# Patient Record
Sex: Male | Born: 1955 | Race: White | Hispanic: No | Marital: Married | Smoking: Never smoker
Health system: Southern US, Community
[De-identification: ages and names within clinical notes are randomized; demographics above are authoritative.]

## PROBLEM LIST (undated history)

## (undated) DIAGNOSIS — I499 Cardiac arrhythmia, unspecified: Secondary | ICD-10-CM

## (undated) DIAGNOSIS — G5603 Carpal tunnel syndrome, bilateral upper limbs: Secondary | ICD-10-CM

## (undated) DIAGNOSIS — I10 Essential (primary) hypertension: Secondary | ICD-10-CM

## (undated) DIAGNOSIS — M1990 Unspecified osteoarthritis, unspecified site: Secondary | ICD-10-CM

## (undated) DIAGNOSIS — I639 Cerebral infarction, unspecified: Secondary | ICD-10-CM

## (undated) DIAGNOSIS — E669 Obesity, unspecified: Secondary | ICD-10-CM

## (undated) DIAGNOSIS — K219 Gastro-esophageal reflux disease without esophagitis: Secondary | ICD-10-CM

## (undated) DIAGNOSIS — F909 Attention-deficit hyperactivity disorder, unspecified type: Secondary | ICD-10-CM

## (undated) DIAGNOSIS — I2510 Atherosclerotic heart disease of native coronary artery without angina pectoris: Secondary | ICD-10-CM

## (undated) DIAGNOSIS — G4730 Sleep apnea, unspecified: Secondary | ICD-10-CM

## (undated) DIAGNOSIS — E785 Hyperlipidemia, unspecified: Secondary | ICD-10-CM

## (undated) DIAGNOSIS — I519 Heart disease, unspecified: Secondary | ICD-10-CM

## (undated) DIAGNOSIS — I480 Paroxysmal atrial fibrillation: Secondary | ICD-10-CM

## (undated) DIAGNOSIS — G5620 Lesion of ulnar nerve, unspecified upper limb: Secondary | ICD-10-CM

## (undated) DIAGNOSIS — D126 Benign neoplasm of colon, unspecified: Secondary | ICD-10-CM

## (undated) DIAGNOSIS — C801 Malignant (primary) neoplasm, unspecified: Secondary | ICD-10-CM

## (undated) DIAGNOSIS — Z87442 Personal history of urinary calculi: Secondary | ICD-10-CM

## (undated) DIAGNOSIS — G8929 Other chronic pain: Secondary | ICD-10-CM

## (undated) HISTORY — PX: TONSILLECTOMY: SUR1361

## (undated) HISTORY — PX: NASAL SINUS SURGERY: SHX719

## (undated) HISTORY — PX: CYSTOSCOPY W/ URETERAL STENT PLACEMENT: SHX1429

## (undated) HISTORY — DX: Heart disease, unspecified: I51.9

## (undated) HISTORY — DX: Hyperlipidemia, unspecified: E78.5

## (undated) HISTORY — DX: Benign neoplasm of colon, unspecified: D12.6

## (undated) HISTORY — PX: SHOULDER ARTHROSCOPY WITH BICEPS TENDON REPAIR: SHX5674

## (undated) HISTORY — DX: Essential (primary) hypertension: I10

## (undated) HISTORY — PX: COLONOSCOPY: SHX174

## (undated) HISTORY — PX: OTHER SURGICAL HISTORY: SHX169

## (undated) HISTORY — PX: POLYPECTOMY: SHX149

## (undated) HISTORY — PX: CHOLECYSTECTOMY: SHX55

## (undated) NOTE — *Deleted (*Deleted)
Echocardiogram Echocardiogram Transesophageal has been performed.  Leta Jungling M 08/23/2020, 10:47 AM

---
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
## 1998-02-18 ENCOUNTER — Ambulatory Visit (HOSPITAL_COMMUNITY): Admission: RE | Admit: 1998-02-18 | Discharge: 1998-02-18 | Payer: Self-pay | Admitting: Neurosurgery

## 1999-07-12 ENCOUNTER — Inpatient Hospital Stay (HOSPITAL_COMMUNITY): Admission: EM | Admit: 1999-07-12 | Discharge: 1999-07-14 | Payer: Self-pay | Admitting: Emergency Medicine

## 2000-01-15 ENCOUNTER — Other Ambulatory Visit: Admission: RE | Admit: 2000-01-15 | Discharge: 2000-01-15 | Payer: Self-pay | Admitting: Gastroenterology

## 2000-01-15 ENCOUNTER — Encounter (INDEPENDENT_AMBULATORY_CARE_PROVIDER_SITE_OTHER): Payer: Self-pay | Admitting: Specialist

## 2000-01-22 ENCOUNTER — Other Ambulatory Visit: Admission: RE | Admit: 2000-01-22 | Discharge: 2000-01-22 | Payer: Self-pay | Admitting: Gastroenterology

## 2002-06-03 ENCOUNTER — Encounter: Payer: Self-pay | Admitting: Neurology

## 2002-06-03 ENCOUNTER — Ambulatory Visit (HOSPITAL_COMMUNITY): Admission: RE | Admit: 2002-06-03 | Discharge: 2002-06-03 | Payer: Self-pay | Admitting: Neurology

## 2002-06-15 ENCOUNTER — Ambulatory Visit: Admission: RE | Admit: 2002-06-15 | Discharge: 2002-06-15 | Payer: Self-pay | Admitting: Neurology

## 2002-09-07 ENCOUNTER — Ambulatory Visit: Admission: RE | Admit: 2002-09-07 | Discharge: 2002-09-07 | Payer: Self-pay | Admitting: Neurology

## 2005-05-30 ENCOUNTER — Ambulatory Visit (HOSPITAL_COMMUNITY): Admission: RE | Admit: 2005-05-30 | Discharge: 2005-05-30 | Payer: Self-pay | Admitting: Orthopedic Surgery

## 2006-04-30 ENCOUNTER — Ambulatory Visit: Payer: Self-pay | Admitting: *Deleted

## 2006-04-30 ENCOUNTER — Ambulatory Visit: Payer: Self-pay | Admitting: Cardiology

## 2006-04-30 ENCOUNTER — Ambulatory Visit (HOSPITAL_COMMUNITY): Admission: RE | Admit: 2006-04-30 | Discharge: 2006-04-30 | Payer: Self-pay | Admitting: Cardiology

## 2006-05-15 ENCOUNTER — Ambulatory Visit: Payer: Self-pay | Admitting: Cardiology

## 2007-08-27 ENCOUNTER — Inpatient Hospital Stay (HOSPITAL_COMMUNITY): Admission: EM | Admit: 2007-08-27 | Discharge: 2007-08-31 | Payer: Self-pay | Admitting: Emergency Medicine

## 2009-12-19 ENCOUNTER — Encounter (INDEPENDENT_AMBULATORY_CARE_PROVIDER_SITE_OTHER): Payer: Self-pay | Admitting: *Deleted

## 2010-08-28 ENCOUNTER — Telehealth: Payer: Self-pay | Admitting: Gastroenterology

## 2010-11-10 HISTORY — PX: ANKLE SURGERY: SHX546

## 2010-12-12 NOTE — Progress Notes (Signed)
Summary: Schedule Colonoscopy.  Phone Note Outgoing Call Call back at [facility] Phone [phone]   Call placed by: Harlow Mares CMA Duncan Dull),  August 28, 2010 2:23 PM Call placed to: Patient Summary of Call: Left a message on patients machine to call back, patient due for colonoscopy. Initial call taken by: Harlow Mares CMA Duncan Dull),  August 28, 2010 2:23 PM  Follow-up for Phone Call        Left a message on the patient machine to call back and schedule a previsit and procedure with our office. A letter will be mailed to the patient.   Follow-up by: Harlow Mares CMA (AAMA),  September 10, 2010 1:25 PM

## 2010-12-12 NOTE — Letter (Signed)
Summary: Colonoscopy Letter  [facility]  [address] [place], [place] [address]   Phone: [phone]  Fax: [phone]      December 19, 2009 MRN: [id number]   Lucas [name] [address] [place], [place]  [address]   Dear [name],   According to your medical record, it is time for you to schedule a Colonoscopy. The American Cancer Society recommends this procedure as a method to detect early colon cancer. Patients with a family history of colon cancer, or a personal history of colon polyps or inflammatory bowel disease are at increased risk.  This letter has beeen generated based on the recommendations made at the time of your procedure. If you feel that in your particular situation this may no longer apply, please contact our office.  Please call our office at [phone] to schedule this appointment or to update your records at your earliest convenience.  Thank you for cooperating with [place] to provide you with the very best care possible.   Sincerely,  Judie Petit T. Russella Dar, M.D.  [facility] [phone]
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
## 2011-03-28 NOTE — Discharge Summary (Signed)
NAME:  Lucas [name], Lucas [name] NO.:  [id number]   MEDICAL RECORD NO.:  [id number]          PATIENT TYPE:  OIB   LOCATION:  2899                         FACILITY:  [facility]   PHYSICIAN:  Vida Roller, M.D.   DATE OF BIRTH:  1956-09-27   DATE OF ADMISSION:  04/30/2006  DATE OF DISCHARGE:  04/30/2006                                 DISCHARGE SUMMARY   PRINCIPAL DIAGNOSIS:  Chest pain.   OTHER DIAGNOSES:  1.  Hypertension.  2.  Hyperlipidemia.  3.  Type 2 diabetes mellitus.  4.  Gastroesophageal reflux disease.  5.  Irritable bowel syndrome.  6.  Depression.  7.  History of prostatitis.  8.  Degenerative disk disease.  9.  History of tonsillectomy and nasal sinus surgery.  10. History of colonoscopy.   ALLERGIES:  DICLOFENAC causes epigastric pain, GLUCOPHAGE causes diarrhea,  CODEINE causes nausea.   PROCEDURES:  Left heart cardiac catheterization.   HISTORY OF PRESENT ILLNESS:  55 year old white male with no prior history of  CAD who presented to his primary care physician's office on April 29, 2006,  complaining of a one day history of intermittent retrosternal chest  discomfort radiating across his chest into the left shoulder.  He was seen  by Dr. Doreen Beam and the decision was made to admit him to [facility] for further evaluation.   HOSPITAL COURSE:  The patient ruled out for MI at [facility] and was seen by  Dr. Andee Lineman for cardiology consultation.  The decision was made to transfer  him to [facility] for further evaluation.  Following arrival at  [facility], he was taken to the cardiac catheterization laboratory  where he underwent left heart cardiac catheterization revealing non-  obstructive coronary artery disease with an EF of 60% and without regional  wall motion abnormalities.  The patient will be continued on his home  medication regimen which included aspirin, beta blocker, ACE inhibitor,  statin and diabetes medications  and will be discharged home this afternoon  in satisfactory condition.   DISCHARGE LAB WORK:  From [facility], cardiac markers negative x2.  Hemoglobin 14.2, hematocrit 41.4, WBC 9.8, platelets 152. PT 12.6, INR 1.1,  PTT 24.7.  Sodium 138, potassium 4.1, chloride 105, CO2 27, BUN 13,  creatinine 1, glucose 307.   DISPOSITION:  The patient is being discharged home today in good condition.   FOLLOW-UP APPOINTMENTS:  He has a follow-up appointment with Dr. Andee Lineman in  [place] on July 6 at 2:00 p.m..  He is asked to follow up with his primary care  physician, Dr. Wende Crease, in 3-4 weeks.   DISCHARGE MEDICATIONS:  Aspirin 81 mg daily, Toprol XL 50 mg daily, Lipitor  10 mg daily, benazepril 10 mg daily, Amaryl 2 mg daily, Avandia 4 mg daily,  Lexapro 10 mg daily.   OUTSTANDING LAB STUDIES:  None.   DURATION OF DISCHARGE ENCOUNTER:  25 minutes.      Ok Anis, NP      Vida Roller, M.D.  Electronically Signed    CRB/MEDQ  D:  04/30/2006  T:  04/30/2006  Job:  [id number]   cc:   Learta Codding, M.D. [facility]  [address]  [address]  [place]  [place] [address]   Wende Crease, M.D.

## 2011-03-28 NOTE — Op Note (Signed)
NAME:  Lucas [name], Lucas [name] NO.:  [id number]   MEDICAL RECORD NO.:  [id number]          PATIENT TYPE:  AMB   LOCATION:  DAY                          FACILITY:  [facility]   PHYSICIAN:  Madlyn Frankel. Charlann Boxer, M.D.  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 There was extensive  hypertrophic synovium throughout the knee.   PROCEDURE:  Left knee diagnostic and operative arthroscopy with partial  medial meniscectomy and medial femoral condyle chondroplasty.   SURGEON:  Madlyn Frankel. Charlann Boxer, M.D.   ASSISTANT:  None.   ANESTHESIA:  General LMA.   DRAINS:  None.   COMPLICATIONS:  None.   INDICATIONS FOR PROCEDURE:  Mr. Tierce is a 55 year old gentleman who  presented to the office with mechanical type symptoms to the left knee with  primarily left knee medial knee pain. He had had an injury. He had  instability symptoms to the knee particularly stair climbing pain and sharp  pains medially. He failed conservative measures. The risks and benefits were  reviewed for proceeding with diagnostic and operative arthroscopy. Consent  was obtained.   DESCRIPTION OF PROCEDURE:  The patient was brought to the operative theater.  Once adequate anesthesia, preoperative antibiotics, 1 gram of Ancef, was  administered, the patient was positioned supinely, the left leg in a leg  holder. The left lower extremity was then prepped and draped in a sterile  fashion. Standard inferolateral, superolateral, inferomedial portals were  utilized. Diagnostic evaluation of the knee was carried out revealing the  above findings. There was noted to be significant hyperemic and hypertrophic  synovium. Based on the nature of this, I did not want to debride this and  cause significant bleeding or bloody effusions postoperatively. Inferomedial  portal was utilized for diagnostic approach with the probe followed by using  the straight biting basket and a 3.5 Kuda shaver. I debrided and contoured  the remaining meniscus. A 3.5 Kuda shaver was also used to debride the  medial femoral condyle for the chondroplasty. I felt this was grade 2-3 type  changes with no exposed bone. The patient's anterior cruciate and posterior  crutiate ligaments were noted to be intact. The patellofemoral component had  some grade 2 type change to it but I did not debride this.   The knee was reexamined and suction drained for any loose fragments or  cartilage and other debris. Once I was satisfied there was no other material  in the knee, the instrumentation was removed, the portals were then closed  with 4-0 nylon, knee injected with 0.25% Marcaine with epinephrine. The knee  was then dressed sterilely in a bulky Jones dressing. The patient was then  extubated and transferred to the recovery room in stable condition.  MDO/MEDQ  D:  05/30/2005  T:  05/30/2005  Job:  [id number]

## 2011-03-28 NOTE — Cardiovascular Report (Signed)
NAME:  Lucas [name], Lucas [name] NO.:  [id number]   MEDICAL RECORD NO.:  [id number]          PATIENT TYPE:  OIB   LOCATION:  2899                         FACILITY:  [facility]   PHYSICIAN:  Vida Roller, M.D.   DATE OF BIRTH:  [birth year]   DATE OF PROCEDURE:  04/30/2006  DATE OF DISCHARGE:                              CARDIAC CATHETERIZATION   HISTORY OF PRESENT ILLNESS:  This is a 55 year old man with diabetes,  hypertension, hyperlipidemia, and gastroesophageal reflux disease who  presents with chest discomfort concerning for angina.  He was sent to [place]  from the cardiology group at [facility] for heart catheterization.   PROCEDURES PERFORMED:  1.  Left heart catheterization.  2.  Selective coronary angiography.  3.  Left ventriculography.   DETAILS OF THE PROCEDURE:  After obtaining informed consent the patient was  brought to the cardiac catheterization laboratory in the fasting state.  There he was prepped and draped in the usual sterile manner and a local  anesthetic was obtained over the right groin using 1% lidocaine without  epinephrine.  The right femoral artery was cannulated using a modified  Seldinger technique with a #6 [place] sheath 10 cm sheath and left heart  catheterization was performed using a #6 French Judkins left #4, a #6 [place]  Judkins right #4 and a #6 French pigtail catheter.  The pigtail catheter was  used for left ventriculography in the 30 degree RAO view.  At the conclusion  of the procedures the catheters were removed. The patient was moved back to  the cardiology holding area where the femoral artery sheath was removed.  Hemostasis was obtained using direct manual pressure. At the conclusion of  the hold there was no evidence of ecchymosis or hematoma formation and  distal pulses were intact.  Total fluoroscopic time was 3.7 minutes. Total  iodinized contrast used was 100 cc.   RESULTS:  Aortic pressure 120/75, mean of 95 mmHg.   Left ventricular pressure 117/14 with an end diastolic pressure of 23 mmHg.   SELECTIVE CORONARY ANGIOGRAPHY:  The left main coronary artery is a large  vessel which is relatively short and has a 20% ostial stenosis with no flow-  limiting disease.   The left anterior descending coronary artery is a large caliber vessel with  one large branching diagonal, there is a 25% stenosis in the proximal  portion of the LAD with no flow-limiting stenosis.   The left circumflex coronary artery is a large caliber dominant vessel with  a moderate caliber branching obtuse marginal, several small obtuse  marginal's and a moderate caliber posterior descending coronary artery. The  left circumflex is without significant flow limiting lesions.   The right coronary artery is a small non dominant vessel without significant  flow limiting lesions.   LEFT VENTRICULOGRAM:  Left ventriculogram reveals preserved LV systolic  function estimated ejection fraction 60%, no wall motion abnormalities, no  mitral regurgitation.   ASSESSMENT:  1.  Non obstructive left dominant coronaries.  2.  Normal left ventricular systolic function.  3.  Non cardiac chest pain.  PLAN:  Home today after bed rest.  1.  Maximize cardiac risk factors.  2.  Follow up with Dr. Lewayne Bunting, the patient's cardiologist for follow up      in 2-3 weeks.      Vida Roller, M.D.  Electronically Signed     JH/MEDQ  D:  04/30/2006  T:  04/30/2006  Job:  [id number]   cc:   Wende Crease, M.D.  [place], [place].   Learta Codding, M.D. [facility]  [address]  [address]  [place]  [place] [address]

## 2011-08-20 LAB — BASIC METABOLIC PANEL
BUN: 12
BUN: 6
BUN: 6
BUN: 8
CO2: 26
CO2: 27
CO2: 29
CO2: 30
Calcium: 8.3 — ABNORMAL LOW
Calcium: 8.3 — ABNORMAL LOW
Calcium: 8.5
Calcium: 8.7
Chloride: 102
Chloride: 94 — ABNORMAL LOW
Chloride: 95 — ABNORMAL LOW
Chloride: 99
Creatinine, Ser: 0.72
Creatinine, Ser: 0.79
Creatinine, Ser: 0.8
Creatinine, Ser: 0.91
GFR calc Af Amer: >60
GFR calc Af Amer: >60
GFR calc Af Amer: >60
GFR calc Af Amer: >60
GFR calc non Af Amer: >60
GFR calc non Af Amer: >60
GFR calc non Af Amer: >60
GFR calc non Af Amer: >60
Glucose, Bld: 139 — ABNORMAL HIGH
Glucose, Bld: 174 — ABNORMAL HIGH
Glucose, Bld: 180 — ABNORMAL HIGH
Glucose, Bld: 283 — ABNORMAL HIGH
Potassium: 3.5
Potassium: 4.1
Potassium: 4.3
Potassium: 5.1
Sodium: 131 — ABNORMAL LOW
Sodium: 131 — ABNORMAL LOW
Sodium: 132 — ABNORMAL LOW
Sodium: 137

## 2011-08-20 LAB — CBC
HCT: 39.3
HCT: 39.9
HCT: 40.2
HCT: 41.3
HCT: 41.6
HCT: 45.2
Hemoglobin: 13.4
Hemoglobin: 13.7
Hemoglobin: 14
Hemoglobin: 14.1
Hemoglobin: 14.3
Hemoglobin: 15.4
MCHC: 33.9
MCHC: 34
MCHC: 34.2
MCHC: 34.3
MCHC: 34.4
MCHC: 35
MCV: 90.6
MCV: 90.8
MCV: 91
MCV: 91.7
MCV: 92
MCV: 92.4
Platelets: 119 — ABNORMAL LOW
Platelets: 132 — ABNORMAL LOW
Platelets: 132 — ABNORMAL LOW
Platelets: 171
Platelets: 192
Platelets: 200
RBC: 4.25
RBC: 4.39
RBC: 4.43
RBC: 4.5
RBC: 4.59
RBC: 4.91
RDW: 13.2
RDW: 13.3
RDW: 13.4
RDW: 13.4
RDW: 13.5
RDW: 13.5
WBC: 14.4 — ABNORMAL HIGH
WBC: 14.6 — ABNORMAL HIGH
WBC: 14.8 — ABNORMAL HIGH
WBC: 15.3 — ABNORMAL HIGH
WBC: 17.8 — ABNORMAL HIGH
WBC: 20.5 — ABNORMAL HIGH

## 2011-08-20 LAB — URINE MICROSCOPIC-ADD ON

## 2011-08-20 LAB — DIFFERENTIAL
Basophils Absolute: 0.1
Basophils Relative: 0
Eosinophils Absolute: 0
Eosinophils Relative: 0
Lymphocytes Relative: 13
Lymphs Abs: 2.6
Monocytes Absolute: 1.3 — ABNORMAL HIGH
Monocytes Relative: 6
Neutro Abs: 16.4 — ABNORMAL HIGH
Neutrophils Relative %: 80 — ABNORMAL HIGH

## 2011-08-20 LAB — URINALYSIS, ROUTINE W REFLEX MICROSCOPIC
Bilirubin Urine: NEGATIVE
Bilirubin Urine: NEGATIVE
Glucose, UA: 500 — AB
Glucose, UA: >1000 — AB
Ketones, ur: 15 — AB
Ketones, ur: 15 — AB
Leukocytes, UA: NEGATIVE
Nitrite: NEGATIVE
Nitrite: POSITIVE — AB
Protein, ur: 100 — AB
Protein, ur: 30 — AB
Specific Gravity, Urine: 1.025
Specific Gravity, Urine: >1.046 — ABNORMAL HIGH
Urobilinogen, UA: 0.2
Urobilinogen, UA: 1
pH: 5.5
pH: 6

## 2011-08-20 LAB — HEMOGLOBIN AND HEMATOCRIT, BLOOD
HCT: 42.5
Hemoglobin: 14.6

## 2011-08-20 LAB — I-STAT 8, (EC8 V) (CONVERTED LAB)
Acid-base deficit: 3 — ABNORMAL HIGH
BUN: 16
Bicarbonate: 24
Chloride: 104
Glucose, Bld: 290 — ABNORMAL HIGH
HCT: 48
Hemoglobin: 16.3
Operator id: 282201
Potassium: 4.6
Sodium: 137
TCO2: 25
pCO2, Ven: 47.8
pH, Ven: 7.309 — ABNORMAL HIGH

## 2011-08-20 LAB — HEMOGLOBIN A1C
Hgb A1c MFr Bld: 8.9 — ABNORMAL HIGH
Mean Plasma Glucose: 240

## 2011-08-20 LAB — POCT I-STAT CREATININE
Creatinine, Ser: 0.9
Operator id: 282201

## 2012-02-09 ENCOUNTER — Encounter: Payer: Self-pay | Admitting: Gastroenterology

## 2012-07-07 ENCOUNTER — Encounter: Payer: Self-pay | Admitting: Gastroenterology

## 2012-08-10 ENCOUNTER — Encounter: Payer: Self-pay | Admitting: Gastroenterology

## 2012-08-10 ENCOUNTER — Ambulatory Visit (AMBULATORY_SURGERY_CENTER): Payer: 59 | Admitting: *Deleted

## 2012-08-10 VITALS — Ht 70.0 in | Wt 297.6 lb

## 2012-08-10 DIAGNOSIS — Z860100 Personal history of colon polyps, unspecified: Secondary | ICD-10-CM

## 2012-08-10 DIAGNOSIS — D126 Benign neoplasm of colon, unspecified: Secondary | ICD-10-CM

## 2012-08-10 DIAGNOSIS — Z1211 Encounter for screening for malignant neoplasm of colon: Secondary | ICD-10-CM

## 2012-08-10 HISTORY — DX: Benign neoplasm of colon, unspecified: D12.6

## 2012-08-10 MED ORDER — MOVIPREP 100 G PO SOLR: 1.0000 | Freq: Once | ORAL | Status: DC

## 2012-08-24 ENCOUNTER — Encounter: Payer: Self-pay | Admitting: Gastroenterology

## 2012-08-24 ENCOUNTER — Ambulatory Visit (AMBULATORY_SURGERY_CENTER): Payer: 59 | Admitting: Gastroenterology

## 2012-08-24 VITALS — BP 150/86 | HR 66 | Temp 96.6°F | Resp 16 | Ht 70.0 in | Wt 297.0 lb

## 2012-08-24 DIAGNOSIS — Z1211 Encounter for screening for malignant neoplasm of colon: Secondary | ICD-10-CM

## 2012-08-24 DIAGNOSIS — D126 Benign neoplasm of colon, unspecified: Secondary | ICD-10-CM

## 2012-08-24 LAB — GLUCOSE, CAPILLARY
Glucose-Capillary: 90 mg/dL (ref 70–99)
Glucose-Capillary: 79 mg/dL (ref 70–99)

## 2012-08-24 MED ORDER — SODIUM CHLORIDE 0.9 % IV SOLN: 500.0000 mL | INTRAVENOUS | Status: DC

## 2012-08-24 NOTE — Patient Instructions (Addendum)
YOU HAD AN ENDOSCOPIC PROCEDURE TODAY AT THE [facility]: Refer to the procedure report that was given to you for any specific questions about what was found during the examination.  If the procedure report does not answer your questions, please call your gastroenterologist to clarify.  If you requested that your care partner not be given the details of your procedure findings, then the procedure report has been included in a sealed envelope for you to review at your convenience later.  YOU SHOULD EXPECT: Some feelings of bloating in the abdomen. Passage of more gas than usual.  Walking can help get rid of the air that was put into your GI tract during the procedure and reduce the bloating. If you had a lower endoscopy (such as a colonoscopy or flexible sigmoidoscopy) you may notice spotting of blood in your stool or on the toilet paper. If you underwent a bowel prep for your procedure, then you may not have a normal bowel movement for a few days.  DIET: Your first meal following the procedure should be a light meal and then it is ok to progress to your normal diet.  A half-sandwich or bowl of soup is an example of a good first meal.  Heavy or fried foods are harder to digest and may make you feel nauseous or bloated.  Likewise meals heavy in dairy and vegetables can cause extra gas to form and this can also increase the bloating.  Drink plenty of fluids but you should avoid alcoholic beverages for 24 hours.  ACTIVITY: Your care partner should take you home directly after the procedure.  You should plan to take it easy, moving slowly for the rest of the day.  You can resume normal activity the day after the procedure however you should NOT DRIVE or use heavy machinery for 24 hours (because of the sedation medicines used during the test).    SYMPTOMS TO REPORT IMMEDIATELY: A gastroenterologist can be reached at any hour.  During normal business hours, 8:30 AM to 5:00 PM Monday through Friday,  call [phone].  After hours and on weekends, please call the GI answering service at [phone] who will take a message and have the physician on call contact you.   Following lower endoscopy (colonoscopy or flexible sigmoidoscopy):  Excessive amounts of blood in the stool  Significant tenderness or worsening of abdominal pains  Swelling of the abdomen that is new, acute  Fever of 100F or higher  FOLLOW UP: If any biopsies were taken you will be contacted by phone or by letter within the next 1-3 weeks.  Call your gastroenterologist if you have not heard about the biopsies in 3 weeks.  Our staff will call the home number listed on your records the next business day following your procedure to check on you and address any questions or concerns that you may have at that time regarding the information given to you following your procedure. This is a courtesy call and so if there is no answer at the home number and we have not heard from you through the emergency physician on call, we will assume that you have returned to your regular daily activities without incident.  SIGNATURES/CONFIDENTIALITY: You and/or your care partner have signed paperwork which will be entered into your electronic medical record.  These signatures attest to the fact that that the information above on your After Visit Summary has been reviewed and is understood.  Full responsibility of the confidentiality of this   discharge information lies with you and/or your care-partner.    Resume medications. Information given on polyps with discharge instructions. 

## 2012-08-24 NOTE — Progress Notes (Signed)
Propofol per M Smith CRNA, all meds titrated per CRNA during procedure. See scanned intra procedure report. ewm 

## 2012-08-24 NOTE — Progress Notes (Signed)
[id number] pt. Alert talking,crackers and juice given no s/s of hypoglycemia noted. Physician made aware.

## 2012-08-24 NOTE — Progress Notes (Signed)
Patient did not experience any of the following events: a burn prior to discharge; a fall within the facility; wrong site/side/patient/procedure/implant event; or a hospital transfer or hospital admission upon discharge from the facility. (G8907) Patient did not have preoperative order for IV antibiotic SSI prophylaxis. ([id number])  

## 2012-08-24 NOTE — Op Note (Signed)
[facility] [address] [place], [address]   COLONOSCOPY PROCEDURE REPORT PATIENT: Lucas, [name]  MR#: [id number] BIRTHDATE: 08-31-1956 , 56  yrs. old GENDER: Male ENDOSCOPIST: Meryl Dare, MD, [facility] PROCEDURE DATE:  08/24/2012 PROCEDURE:   Colonoscopy with biopsy ASA CLASS:   Class II INDICATIONS:average risk screening. MEDICATIONS: MAC sedation, administered by CRNA and propofol (Diprivan) 200mg  IV DESCRIPTION OF PROCEDURE:   After the risks benefits and alternatives of the procedure were thoroughly explained, informed consent was obtained.  A digital rectal exam revealed no abnormalities of the rectum.   The LB [id number]  endoscope was introduced through the anus and advanced to the cecum, which was identified by both the appendix and ileocecal valve. No adverse events experienced.   The quality of the prep was good, using MoviPrep  The instrument was then slowly withdrawn as the colon was fully examined.  COLON FINDINGS: A sessile polyp measuring 5 mm in size was found in the descending colon.  A polypectomy was performed with cold forceps.  The resection was complete and the polyp tissue was completely retrieved.   A sessile polyp measuring 4 mm in size was found in the sigmoid colon.  A polypectomy was performed with cold forceps.  The resection was complete and the polyp tissue was completely retrieved.   A sessile polyp measuring 4 mm in size was found in the rectum.  A polypectomy was performed with cold forceps.  The resection was complete and the polyp tissue was completely retrieved.   The colon was otherwise normal.  There was no diverticulosis, inflammation, polyps or cancers unless previously stated.  Retroflexed views revealed no abnormalities. The time to cecum=2 minutes 05 seconds.  Withdrawal time=12 minutes 21 seconds.  The scope was withdrawn and the procedure completed.  COMPLICATIONS: There were no  complications.  ENDOSCOPIC IMPRESSION: 1.   Sessile polyp in the descending colon; polypectomy was performed with cold forceps 2.   Sessile polyp in the sigmoid colon; polypectomy was performed with cold forceps 3.   Sessile polyp in the rectum; polypectomy was performed with cold forceps  RECOMMENDATIONS: 1.  Await pathology results 2.  Repeat colonoscopy in 5 years if polyp adenomatous; otherwise 10 years  eSigned:  Meryl Dare, MD, [facility] 08/24/2012 9:10 AM   cc: Werner Lean, MD

## 2012-08-25 ENCOUNTER — Telehealth: Payer: Self-pay | Admitting: *Deleted

## 2012-08-25 NOTE — Telephone Encounter (Signed)
  Follow up Call-  Call back number 08/24/2012  Post procedure Call Back phone  # [phone]  Permission to leave phone message Yes     Patient questions:  Do you have a fever, pain , or abdominal swelling? no Pain Score  0 *  Have you tolerated food without any problems? yes  Have you been able to return to your normal activities? yes  Do you have any questions about your discharge instructions: Diet   no Medications  no Follow up visit  no  Do you have questions or concerns about your Care? no  Actions: * If pain score is 4 or above: No action needed, pain <4.

## 2012-08-30 ENCOUNTER — Encounter: Payer: Self-pay | Admitting: Gastroenterology

## 2012-11-08 ENCOUNTER — Encounter (HOSPITAL_COMMUNITY)
Admission: RE | Admit: 2012-11-08 | Discharge: 2012-11-08 | Disposition: A | Discharge status: Home / Self Care | Payer: 59 | Source: Ambulatory Visit | Attending: Ophthalmology | Admitting: Ophthalmology

## 2012-11-08 ENCOUNTER — Encounter (HOSPITAL_COMMUNITY): Payer: Self-pay

## 2012-11-08 ENCOUNTER — Encounter (HOSPITAL_COMMUNITY): Payer: Self-pay | Admitting: Pharmacy Technician

## 2012-11-08 HISTORY — DX: Unspecified osteoarthritis, unspecified site: M19.90

## 2012-11-08 HISTORY — DX: Sleep apnea, unspecified: G47.30

## 2012-11-08 LAB — HEMOGLOBIN AND HEMATOCRIT, BLOOD
HCT: 42.9 % (ref 39.0–52.0)
Hemoglobin: 15.5 g/dL (ref 13.0–17.0)

## 2012-11-08 LAB — BASIC METABOLIC PANEL
BUN: 15 mg/dL (ref 6–23)
CO2: 27 mEq/L (ref 19–32)
Calcium: 9.2 mg/dL (ref 8.4–10.5)
Chloride: 102 mEq/L (ref 96–112)
Creatinine, Ser: 0.83 mg/dL (ref 0.50–1.35)
GFR calc Af Amer: >90 mL/min (ref >90)
GFR calc non Af Amer: >90 mL/min (ref >90)
Glucose, Bld: 277 mg/dL — ABNORMAL HIGH (ref 70–99)
Potassium: 3.9 mEq/L (ref 3.5–5.1)
Sodium: 139 mEq/L (ref 135–145)

## 2012-11-08 NOTE — Patient Instructions (Signed)
Lucas [name]  11/08/2012   Your procedure is scheduled on:  11/11/12  Report to [facility] at 1030 AM.  Call this number if you have problems the morning of surgery: [phone]   Remember:   Do not eat food:After Midnight.  May have clear liquids:until Midnight .  Clear liquids include soda, tea, black coffee, apple or grape juice, broth.  Take these medicines the morning of surgery with A SIP OF WATER: proazac, benazepril, metoprolol.  NO DIABETIC MEDICINE MORNING OF PROCEDURE.   Do not wear jewelry, make-up or nail polish.  Do not wear lotions, powders, or perfumes. You may wear deodorant.  Do not shave 48 hours prior to surgery. Men may shave face and neck.  Do not bring valuables to the hospital.  Contacts, dentures or bridgework may not be worn into surgery.  Leave suitcase in the car. After surgery it may be brought to your room.  For patients admitted to the hospital, checkout time is 11:00 AM the day of discharge.   Patients discharged the day of surgery will not be allowed to drive home.  Name and phone number of your driver: family  Special Instructions: [name]   Please read over the following fact sheets that you were given: Anesthesia Post-op Instructions and Care and Recovery After Surgery PATIENT INSTRUCTIONS POST-ANESTHESIA  IMMEDIATELY FOLLOWING SURGERY:  Do not drive or operate machinery for the first twenty four hours after surgery.  Do not make any important decisions for twenty four hours after surgery or while taking narcotic pain medications or sedatives.  If you develop intractable nausea and vomiting or a severe headache please notify your doctor immediately.  FOLLOW-UP:  Please make an appointment with your surgeon as instructed. You do not need to follow up with anesthesia unless specifically instructed to do so.  WOUND CARE INSTRUCTIONS (if applicable):  Keep a dry clean dressing on the anesthesia/puncture wound site if there is drainage.  Once the wound has  quit draining you may leave it open to air.  Generally you should leave the bandage intact for twenty four hours unless there is drainage.  If the epidural site drains for more than 36-48 hours please call the anesthesia department.  QUESTIONS?:  Please feel free to call your physician or the hospital operator if you have any questions, and they will be happy to assist you.      ataract Surgery  A cataract is a clouding of the lens of the eye. When a lens becomes cloudy, vision is reduced based on the degree and nature of the clouding. Surgery may be needed to improve vision. Surgery removes the cloudy lens and usually replaces it with a substitute lens (intraocular lens, IOL). LET YOUR EYE DOCTOR KNOW ABOUT:  Allergies to food or medicine.  Medicines taken including herbs, eyedrops, over-the-counter medicines, and creams.  Use of steroids (by mouth or creams).  Previous problems with anesthetics or numbing medicine.  History of bleeding problems or blood clots.  Previous surgery.  Other health problems, including diabetes and kidney problems.  Possibility of pregnancy, if this applies. RISKS AND COMPLICATIONS  Infection.  Inflammation of the eyeball (endophthalmitis) that can spread to both eyes (sympathetic ophthalmia).  Poor wound healing.  If an IOL is inserted, it can later fall out of proper position. This is very uncommon.  Clouding of the part of your eye that holds an IOL in place. This is called an "after-cataract." These are uncommon, but easily treated. BEFORE THE  PROCEDURE  Do not eat or drink anything except small amounts of water for 8 to 12 before your surgery, or as directed by your caregiver.  Unless you are told otherwise, continue any eyedrops you have been prescribed.  Talk to your primary caregiver about all other medicines that you take (both prescription and non-prescription). In some cases, you may need to stop or change medicines near the time of  your surgery. This is most important if you are taking blood-thinning medicine.Do not stop medicines unless you are told to do so.  Arrange for someone to drive you to and from the procedure.  Do not put contact lenses in either eye on the day of your surgery. PROCEDURE There is more than one method for safely removing a cataract. Your doctor can explain the differences and help determine which is best for you. Phacoemulsification surgery is the most common form of cataract surgery.  An injection is given behind the eye or eyedrops are given to make this a painless procedure.  A small cut (incision) is made on the edge of the clear, dome-shaped surface that covers the front of the eye (cornea).  A tiny probe is painlessly inserted into the eye. This device gives off ultrasound waves that soften and break up the cloudy center of the lens. This makes it easier for the cloudy lens to be removed by suction.  An IOL may be implanted.  The normal lens of the eye is covered by a clear capsule. Part of that capsule is intentionally left in the eye to support the IOL.  Your surgeon may or may not use stitches to close the incision. There are other forms of cataract surgery that require a larger incision and stiches to close the eye. This approach is taken in cases where the doctor feels that the cataract cannot be easily removed using phacoemulsification. AFTER THE PROCEDURE  When an IOL is implanted, it does not need care. It becomes a permanent part of your eye and cannot be seen or felt.  Your doctor will schedule follow-up exams to check on your progress.  Review your other medicines with your doctor to see which can be resumed after surgery.  Use eyedrops or take medicine as prescribed by your doctor. Document Released: 10/16/2011 Document Revised: 01/19/2012 Document Reviewed: 10/16/2011 [facility] Patient Information 2013 [place], [place].

## 2012-11-09 MED ORDER — PHENYLEPHRINE HCL 2.5 % OP SOLN
OPHTHALMIC | Status: AC
  Filled 2012-11-09: qty 2

## 2012-11-09 MED ORDER — LIDOCAINE HCL 3.5 % OP GEL
OPHTHALMIC | Status: AC
  Filled 2012-11-09: qty 5

## 2012-11-09 MED ORDER — TETRACAINE HCL 0.5 % OP SOLN
OPHTHALMIC | Status: AC
  Filled 2012-11-09: qty 2

## 2012-11-09 MED ORDER — CYCLOPENTOLATE-PHENYLEPHRINE 0.2-1 % OP SOLN
OPHTHALMIC | Status: AC
  Filled 2012-11-09: qty 2

## 2012-11-09 MED ORDER — NEOMYCIN-POLYMYXIN-DEXAMETH 3.5-10000-0.1 OP OINT
TOPICAL_OINTMENT | OPHTHALMIC | Status: AC
  Filled 2012-11-09: qty 3.5

## 2012-11-09 MED ORDER — LIDOCAINE HCL (PF) 1 % IJ SOLN
INTRAMUSCULAR | Status: AC
  Filled 2012-11-09: qty 2

## 2012-11-11 ENCOUNTER — Encounter (HOSPITAL_COMMUNITY): Payer: Self-pay | Admitting: *Deleted

## 2012-11-11 ENCOUNTER — Ambulatory Visit (HOSPITAL_COMMUNITY)
Admission: RE | Admit: 2012-11-11 | Discharge: 2012-11-11 | Disposition: A | Discharge status: Home / Self Care | Payer: 59 | Source: Ambulatory Visit | Attending: Ophthalmology | Admitting: Ophthalmology

## 2012-11-11 ENCOUNTER — Encounter (HOSPITAL_COMMUNITY)
Admission: RE | Disposition: A | Discharge status: Home / Self Care | Payer: Self-pay | Source: Ambulatory Visit | Attending: Ophthalmology

## 2012-11-11 ENCOUNTER — Encounter (HOSPITAL_COMMUNITY): Payer: Self-pay | Admitting: Anesthesiology

## 2012-11-11 ENCOUNTER — Ambulatory Visit (HOSPITAL_COMMUNITY): Payer: 59 | Admitting: Anesthesiology

## 2012-11-11 DIAGNOSIS — IMO0002 Reserved for concepts with insufficient information to code with codable children: Secondary | ICD-10-CM | POA: Insufficient documentation

## 2012-11-11 DIAGNOSIS — Z01810 Encounter for preprocedural cardiovascular examination: Secondary | ICD-10-CM | POA: Insufficient documentation

## 2012-11-11 DIAGNOSIS — I10 Essential (primary) hypertension: Secondary | ICD-10-CM | POA: Insufficient documentation

## 2012-11-11 DIAGNOSIS — E119 Type 2 diabetes mellitus without complications: Secondary | ICD-10-CM | POA: Insufficient documentation

## 2012-11-11 DIAGNOSIS — Z01812 Encounter for preprocedural laboratory examination: Secondary | ICD-10-CM | POA: Insufficient documentation

## 2012-11-11 HISTORY — PX: CATARACT EXTRACTION W/PHACO: SHX586

## 2012-11-11 LAB — GLUCOSE, CAPILLARY: Glucose-Capillary: 141 mg/dL — ABNORMAL HIGH (ref 70–99)

## 2012-11-11 SURGERY — PHACOEMULSIFICATION, CATARACT, WITH IOL INSERTION
Anesthesia: Monitor Anesthesia Care | Site: Eye | Laterality: Right | Wound class: Clean

## 2012-11-11 MED ORDER — POVIDONE-IODINE 5 % OP SOLN
OPHTHALMIC | Status: DC | PRN
  Administered 2012-11-11: 1 via OPHTHALMIC

## 2012-11-11 MED ORDER — CYCLOPENTOLATE-PHENYLEPHRINE 0.2-1 % OP SOLN
1.0000 [drp] | OPHTHALMIC | Status: AC
  Administered 2012-11-11 (×3): 1 [drp] via OPHTHALMIC

## 2012-11-11 MED ORDER — MIDAZOLAM HCL 2 MG/2ML IJ SOLN
1.0000 mg | INTRAMUSCULAR | Status: DC | PRN
  Administered 2012-11-11: 2 mg via INTRAVENOUS

## 2012-11-11 MED ORDER — LIDOCAINE HCL (PF) 1 % IJ SOLN
INTRAMUSCULAR | Status: DC | PRN
  Administered 2012-11-11: .3 mL

## 2012-11-11 MED ORDER — PROVISC 10 MG/ML IO SOLN
INTRAOCULAR | Status: DC | PRN
  Administered 2012-11-11: 8.5 mg via INTRAOCULAR

## 2012-11-11 MED ORDER — EPINEPHRINE HCL 1 MG/ML IJ SOLN
INTRAMUSCULAR | Status: AC
  Filled 2012-11-11: qty 1

## 2012-11-11 MED ORDER — ONDANSETRON HCL 4 MG/2ML IJ SOLN: 4.0000 mg | Freq: Once | INTRAMUSCULAR | Status: DC | PRN

## 2012-11-11 MED ORDER — TETRACAINE HCL 0.5 % OP SOLN
1.0000 [drp] | OPHTHALMIC | Status: AC
  Administered 2012-11-11 (×3): 1 [drp] via OPHTHALMIC

## 2012-11-11 MED ORDER — LIDOCAINE 3.5 % OP GEL OPTIME - NO CHARGE
OPHTHALMIC | Status: DC | PRN
  Administered 2012-11-11: 2 [drp] via OPHTHALMIC

## 2012-11-11 MED ORDER — MIDAZOLAM HCL 2 MG/2ML IJ SOLN
INTRAMUSCULAR | Status: AC
  Filled 2012-11-11: qty 2

## 2012-11-11 MED ORDER — FENTANYL CITRATE 0.05 MG/ML IJ SOLN: 25.0000 ug | INTRAMUSCULAR | Status: DC | PRN

## 2012-11-11 MED ORDER — BSS IO SOLN
INTRAOCULAR | Status: DC | PRN
  Administered 2012-11-11: 15 mL via INTRAOCULAR

## 2012-11-11 MED ORDER — NEOMYCIN-POLYMYXIN-DEXAMETH 0.1 % OP OINT
TOPICAL_OINTMENT | OPHTHALMIC | Status: DC | PRN
  Administered 2012-11-11: 1 via OPHTHALMIC

## 2012-11-11 MED ORDER — LACTATED RINGERS IV SOLN
INTRAVENOUS | Status: DC
  Administered 2012-11-11: 1000 mL via INTRAVENOUS

## 2012-11-11 MED ORDER — EPINEPHRINE HCL 1 MG/ML IJ SOLN
INTRAOCULAR | Status: DC | PRN
  Administered 2012-11-11: 13:00:00

## 2012-11-11 MED ORDER — PHENYLEPHRINE HCL 2.5 % OP SOLN
1.0000 [drp] | OPHTHALMIC | Status: AC
  Administered 2012-11-11 (×3): 1 [drp] via OPHTHALMIC

## 2012-11-11 MED ORDER — LIDOCAINE HCL 3.5 % OP GEL
1.0000 "application " | Freq: Once | OPHTHALMIC | Status: AC
  Administered 2012-11-11: 1 via OPHTHALMIC

## 2012-11-11 SURGICAL SUPPLY — 32 items

## 2012-11-11 NOTE — Anesthesia Postprocedure Evaluation (Signed)
  Anesthesia Post-op Note  Patient: Lucas [name]  Procedure(s) Performed: Procedure(s) (LRB) with comments: CATARACT EXTRACTION PHACO AND INTRAOCULAR LENS PLACEMENT (IOC) (Right) - CDE:  5.56  Patient Location: Short Stay  Anesthesia Type:MAC  Level of Consciousness: awake, alert , oriented and patient cooperative  Airway and Oxygen Therapy: Patient Spontanous Breathing  Post-op Pain: none  Post-op Assessment: Post-op Vital signs reviewed, Patient's Cardiovascular Status Stable, Respiratory Function Stable, Patent Airway, No signs of Nausea or vomiting, Adequate PO intake and Pain level controlled  Post-op Vital Signs: Reviewed and stable  Complications: No apparent anesthesia complications

## 2012-11-11 NOTE — Anesthesia Preprocedure Evaluation (Addendum)
Anesthesia Evaluation  Patient identified by MRN, date of birth, ID band Patient awake    Reviewed: Allergy & Precautions, H&P , NPO status , Patient's Chart, lab work & pertinent test results, reviewed documented beta blocker date and time   History of Anesthesia Complications Negative for: history of anesthetic complications  Airway Mallampati: II      Dental  (+) Teeth Intact   Pulmonary sleep apnea ,  breath sounds clear to auscultation        Cardiovascular hypertension, Pt. on medications Rhythm:Regular Rate:Normal     Neuro/Psych    GI/Hepatic   Endo/Other  diabetes, Type 2, Oral Hypoglycemic Agents  Renal/GU      Musculoskeletal   Abdominal   Peds  Hematology   Anesthesia Other Findings   Reproductive/Obstetrics                           Anesthesia Physical Anesthesia Plan  ASA: III  Anesthesia Plan: MAC   Post-op Pain Management:    Induction: Intravenous  Airway Management Planned: Nasal Cannula  Additional Equipment:   Intra-op Plan:   Post-operative Plan:   Informed Consent: I have reviewed the patients History and Physical, chart, labs and discussed the procedure including the risks, benefits and alternatives for the proposed anesthesia with the patient or authorized representative who has indicated his/her understanding and acceptance.     Plan Discussed with:   Anesthesia Plan Comments:         Anesthesia Quick Evaluation  

## 2012-11-11 NOTE — H&P (Signed)
I have reviewed the H&P, the patient was re-examined, and I have identified no interval changes in medical condition and plan of care since the history and physical of record  

## 2012-11-11 NOTE — Transfer of Care (Signed)
Immediate Anesthesia Transfer of Care Note  Patient: Lucas [name]  Procedure(s) Performed: Procedure(s) (LRB) with comments: CATARACT EXTRACTION PHACO AND INTRAOCULAR LENS PLACEMENT (IOC) (Right) - CDE:  5.56  Patient Location: Short Stay  Anesthesia Type:MAC  Level of Consciousness: awake, alert , oriented and patient cooperative  Airway & Oxygen Therapy: Patient Spontanous Breathing  Post-op Assessment: Report given to PACU RN, Post -op Vital signs reviewed and stable and Patient moving all extremities  Post vital signs: Reviewed and stable  Complications: No apparent anesthesia complications

## 2012-11-11 NOTE — Brief Op Note (Signed)
Pre-Op Dx: Cataract OD Post-Op Dx: Cataract OD Surgeon: Youlanda Tomassetti Anesthesia: Topical with MAC Surgery: Cataract Extraction with Intraocular lens Implant OD Implant: B&L enVista Specimen: None Complications: None 

## 2012-11-12 ENCOUNTER — Encounter (HOSPITAL_COMMUNITY): Payer: Self-pay | Admitting: Ophthalmology

## 2012-11-12 NOTE — Op Note (Signed)
NAME:  SUTTER, AHLGREN NO.:  [id number]  MEDICAL RECORD NO.:  [id number]  LOCATION:  [facility]                          FACILITY:  [facility]  PHYSICIAN:  Susanne Greenhouse, MD       DATE OF BIRTH:  Jan 21, 1956  DATE OF PROCEDURE:  11/11/2012 DATE OF DISCHARGE:  11/11/2012                              OPERATIVE REPORT   PREOPERATIVE DIAGNOSIS:  Posterior subcapsular cataract, right eye, diagnosis code 366.02.  POSTOPERATIVE DIAGNOSIS:  Posterior subcapsular cataract, right eye, diagnosis code 366.02.  OPERATION PERFORMED:  Phacoemulsification with posterior chamber intraocular lens implantation, right eye.  SURGEON:  Bonne Dolores. Kaveh Kissinger, MD.  ANESTHESIA:  Topical with monitored anesthesia care and IV sedation.  OPERATIVE SUMMARY:  In the preoperative area, dilating drops were placed into the right eye.  The patient was then brought into the operating room where he was placed under topical anesthesia and IV sedation.  The eye was then prepped and draped.  Beginning with a 75 blade, a paracentesis port was made at the surgeon's 2 o'clock position.  The anterior chamber was then filled with a 1% nonpreserved lidocaine solution with epinephrine.  This was followed by Viscoat to deepen the chamber.  A small fornix-based peritomy was performed superiorly.  Next, a single iris hook was placed through the limbus superiorly.  A 2.4-mm keratome blade was then used to make a clear corneal incision over the iris hook.  A bent cystotome needle and Utrata forceps were used to create a continuous tear capsulotomy.  Hydrodissection was performed using balanced salt solution on a fine cannula.  The lens nucleus was then removed using phacoemulsification in a quadrant cracking technique. The cortical material was then removed with irrigation and aspiration. The capsular bag and anterior chamber were refilled with Provisc.  The wound was widened to approximately 3 mm and a posterior  chamber intraocular lens was placed into the capsular bag without difficulty using an [employer] lens injecting system.  A single 10-0 nylon suture was then used to close the incision as well as stromal hydration. The Provisc was removed from the anterior chamber and capsular bag with irrigation and aspiration.  At this point, the wounds were tested for leak, which were negative.  The anterior chamber remained deep and stable.  The patient tolerated the procedure well.  There were no operative complications, and he awoke from topical anesthesia and IV sedation without problem.  No surgical specimens.  Prosthetic device used is a Theme park manager, model EnVista, model number MX60, power of 20.0, serial number is [id number].          ______________________________ Susanne Greenhouse, MD     KEH/MEDQ  D:  11/11/2012  T:  11/11/2012  Job:  [id number]

## 2013-12-13 ENCOUNTER — Encounter (HOSPITAL_COMMUNITY): Payer: Self-pay

## 2013-12-13 ENCOUNTER — Encounter (HOSPITAL_COMMUNITY)
Admission: RE | Admit: 2013-12-13 | Discharge: 2013-12-13 | Disposition: A | Discharge status: Home / Self Care | Payer: 59 | Source: Ambulatory Visit | Attending: Ophthalmology | Admitting: Ophthalmology

## 2013-12-13 ENCOUNTER — Encounter (HOSPITAL_COMMUNITY): Payer: Self-pay | Admitting: Pharmacy Technician

## 2013-12-13 LAB — BASIC METABOLIC PANEL
BUN: 13 mg/dL (ref 6–23)
CO2: 26 mEq/L (ref 19–32)
Calcium: 9.6 mg/dL (ref 8.4–10.5)
Chloride: 101 mEq/L (ref 96–112)
Creatinine, Ser: 0.83 mg/dL (ref 0.50–1.35)
GFR calc Af Amer: >90 mL/min (ref >90)
GFR calc non Af Amer: >90 mL/min (ref >90)
Glucose, Bld: 93 mg/dL (ref 70–99)
Potassium: 4.5 mEq/L (ref 3.7–5.3)
Sodium: 141 mEq/L (ref 137–147)

## 2013-12-13 LAB — HEMOGLOBIN AND HEMATOCRIT, BLOOD
HCT: 47.9 % (ref 39.0–52.0)
Hemoglobin: 16.9 g/dL (ref 13.0–17.0)

## 2013-12-13 NOTE — Patient Instructions (Signed)
Your procedure is scheduled on: 12/15/2013  Report to [facility] at  900  AM.  Call this number if you have problems the morning of surgery: [phone]   Do not eat food or drink liquids :After Midnight.      Take these medicines the morning of surgery with A SIP OF WATER: benazepril, prozac, metoprolol   Do not wear jewelry, make-up or nail polish.  Do not wear lotions, powders, or perfumes.  Do not shave 48 hours prior to surgery.  Do not bring valuables to the hospital.  Contacts, dentures or bridgework may not be worn into surgery.  Leave suitcase in the car. After surgery it may be brought to your room.  For patients admitted to the hospital, checkout time is 11:00 AM the day of discharge.   Patients discharged the day of surgery will not be allowed to drive home.  :     Please read over the following fact sheets that you were given: Coughing and Deep Breathing, Surgical Site Infection Prevention, Anesthesia Post-op Instructions and Care and Recovery After Surgery    Cataract A cataract is a clouding of the lens of the eye. When a lens becomes cloudy, vision is reduced based on the degree and nature of the clouding. Many cataracts reduce vision to some degree. Some cataracts make people more near-sighted as they develop. Other cataracts increase glare. Cataracts that are ignored and become worse can sometimes look white. The white color can be seen through the pupil. CAUSES   Aging. However, cataracts may occur at any age, even in newborns.   Certain drugs.   Trauma to the eye.   Certain diseases such as diabetes.   Specific eye diseases such as chronic inflammation inside the eye or a sudden attack of a rare form of glaucoma.   Inherited or acquired medical problems.  SYMPTOMS   Gradual, progressive drop in vision in the affected eye.   Severe, rapid visual loss. This most often happens when trauma is the cause.  DIAGNOSIS  To detect a cataract, an eye doctor examines  the lens. Cataracts are best diagnosed with an exam of the eyes with the pupils enlarged (dilated) by drops.  TREATMENT  For an early cataract, vision may improve by using different eyeglasses or stronger lighting. If that does not help your vision, surgery is the only effective treatment. A cataract needs to be surgically removed when vision loss interferes with your everyday activities, such as driving, reading, or watching TV. A cataract may also have to be removed if it prevents examination or treatment of another eye problem. Surgery removes the cloudy lens and usually replaces it with a substitute lens (intraocular lens, IOL).  At a time when both you and your doctor agree, the cataract will be surgically removed. If you have cataracts in both eyes, only one is usually removed at a time. This allows the operated eye to heal and be out of danger from any possible problems after surgery (such as infection or poor wound healing). In rare cases, a cataract may be doing damage to your eye. In these cases, your caregiver may advise surgical removal right away. The vast majority of people who have cataract surgery have better vision afterward. HOME CARE INSTRUCTIONS  If you are not planning surgery, you may be asked to do the following:  Use different eyeglasses.   Use stronger or brighter lighting.   Ask your eye doctor about reducing your medicine dose or changing medicines  if it is thought that a medicine caused your cataract. Changing medicines does not make the cataract go away on its own.   Become familiar with your surroundings. Poor vision can lead to injury. Avoid bumping into things on the affected side. You are at a higher risk for tripping or falling.   Exercise extreme care when driving or operating machinery.   Wear sunglasses if you are sensitive to bright light or experiencing problems with glare.  SEEK IMMEDIATE MEDICAL CARE IF:   You have a worsening or sudden vision loss.    You notice redness, swelling, or increasing pain in the eye.   You have a fever.  Document Released: 10/27/2005 Document Revised: 10/16/2011 Document Reviewed: 06/20/2011 [facility] Patient Information 2012 [place].PATIENT INSTRUCTIONS POST-ANESTHESIA  IMMEDIATELY FOLLOWING SURGERY:  Do not drive or operate machinery for the first twenty four hours after surgery.  Do not make any important decisions for twenty four hours after surgery or while taking narcotic pain medications or sedatives.  If you develop intractable nausea and vomiting or a severe headache please notify your doctor immediately.  FOLLOW-UP:  Please make an appointment with your surgeon as instructed. You do not need to follow up with anesthesia unless specifically instructed to do so.  WOUND CARE INSTRUCTIONS (if applicable):  Keep a dry clean dressing on the anesthesia/puncture wound site if there is drainage.  Once the wound has quit draining you may leave it open to air.  Generally you should leave the bandage intact for twenty four hours unless there is drainage.  If the epidural site drains for more than 36-48 hours please call the anesthesia department.  QUESTIONS?:  Please feel free to call your physician or the hospital operator if you have any questions, and they will be happy to assist you.

## 2013-12-14 MED ORDER — PHENYLEPHRINE HCL 2.5 % OP SOLN
OPHTHALMIC | Status: AC
  Filled 2013-12-14: qty 15

## 2013-12-14 MED ORDER — TETRACAINE HCL 0.5 % OP SOLN
OPHTHALMIC | Status: AC
  Filled 2013-12-14: qty 2

## 2013-12-14 MED ORDER — NEOMYCIN-POLYMYXIN-DEXAMETH 3.5-10000-0.1 OP SUSP
OPHTHALMIC | Status: AC
  Filled 2013-12-14: qty 5

## 2013-12-14 MED ORDER — LIDOCAINE HCL (PF) 1 % IJ SOLN
INTRAMUSCULAR | Status: AC
  Filled 2013-12-14: qty 2

## 2013-12-14 MED ORDER — CYCLOPENTOLATE-PHENYLEPHRINE OP SOLN OPTIME - NO CHARGE
OPHTHALMIC | Status: AC
  Filled 2013-12-14: qty 2

## 2013-12-14 MED ORDER — LIDOCAINE HCL 3.5 % OP GEL
OPHTHALMIC | Status: AC
  Filled 2013-12-14: qty 1

## 2013-12-15 ENCOUNTER — Encounter (HOSPITAL_COMMUNITY): Payer: 59 | Admitting: Anesthesiology

## 2013-12-15 ENCOUNTER — Encounter (HOSPITAL_COMMUNITY): Payer: Self-pay | Admitting: *Deleted

## 2013-12-15 ENCOUNTER — Encounter (HOSPITAL_COMMUNITY)
Admission: RE | Disposition: A | Discharge status: Home / Self Care | Payer: Self-pay | Source: Ambulatory Visit | Attending: Ophthalmology

## 2013-12-15 ENCOUNTER — Ambulatory Visit (HOSPITAL_COMMUNITY)
Admission: RE | Admit: 2013-12-15 | Discharge: 2013-12-15 | Disposition: A | Discharge status: Home / Self Care | Payer: 59 | Source: Ambulatory Visit | Attending: Ophthalmology | Admitting: Ophthalmology

## 2013-12-15 ENCOUNTER — Ambulatory Visit (HOSPITAL_COMMUNITY): Payer: 59 | Admitting: Anesthesiology

## 2013-12-15 DIAGNOSIS — Z01812 Encounter for preprocedural laboratory examination: Secondary | ICD-10-CM | POA: Insufficient documentation

## 2013-12-15 DIAGNOSIS — Z79899 Other long term (current) drug therapy: Secondary | ICD-10-CM | POA: Insufficient documentation

## 2013-12-15 DIAGNOSIS — E119 Type 2 diabetes mellitus without complications: Secondary | ICD-10-CM | POA: Insufficient documentation

## 2013-12-15 DIAGNOSIS — Z01810 Encounter for preprocedural cardiovascular examination: Secondary | ICD-10-CM | POA: Insufficient documentation

## 2013-12-15 DIAGNOSIS — H2589 Other age-related cataract: Secondary | ICD-10-CM | POA: Insufficient documentation

## 2013-12-15 DIAGNOSIS — I10 Essential (primary) hypertension: Secondary | ICD-10-CM | POA: Insufficient documentation

## 2013-12-15 HISTORY — PX: CATARACT EXTRACTION W/PHACO: SHX586

## 2013-12-15 LAB — GLUCOSE, CAPILLARY: Glucose-Capillary: 129 mg/dL — ABNORMAL HIGH (ref 70–99)

## 2013-12-15 SURGERY — PHACOEMULSIFICATION, CATARACT, WITH IOL INSERTION
Anesthesia: Monitor Anesthesia Care | Site: Eye | Laterality: Left

## 2013-12-15 MED ORDER — LIDOCAINE HCL (PF) 1 % IJ SOLN
INTRAMUSCULAR | Status: DC | PRN
  Administered 2013-12-15: .5 mL

## 2013-12-15 MED ORDER — PHENYLEPHRINE HCL 2.5 % OP SOLN
1.0000 [drp] | OPHTHALMIC | Status: AC
  Administered 2013-12-15 (×3): 1 [drp] via OPHTHALMIC

## 2013-12-15 MED ORDER — MIDAZOLAM HCL 2 MG/2ML IJ SOLN
INTRAMUSCULAR | Status: AC
  Filled 2013-12-15: qty 2

## 2013-12-15 MED ORDER — LIDOCAINE 3.5 % OP GEL OPTIME - NO CHARGE
OPHTHALMIC | Status: DC | PRN
  Administered 2013-12-15: 1 [drp] via OPHTHALMIC

## 2013-12-15 MED ORDER — POVIDONE-IODINE 5 % OP SOLN
OPHTHALMIC | Status: DC | PRN
  Administered 2013-12-15: 1 via OPHTHALMIC

## 2013-12-15 MED ORDER — LACTATED RINGERS IV SOLN
INTRAVENOUS | Status: DC
  Administered 2013-12-15: 10:00:00 via INTRAVENOUS

## 2013-12-15 MED ORDER — PROVISC 10 MG/ML IO SOLN
INTRAOCULAR | Status: DC | PRN
  Administered 2013-12-15: 0.85 mL via INTRAOCULAR

## 2013-12-15 MED ORDER — FENTANYL CITRATE 0.05 MG/ML IJ SOLN
25.0000 ug | INTRAMUSCULAR | Status: AC
  Administered 2013-12-15 (×2): 25 ug via INTRAVENOUS

## 2013-12-15 MED ORDER — LIDOCAINE HCL 3.5 % OP GEL
1.0000 "application " | Freq: Once | OPHTHALMIC | Status: AC
  Administered 2013-12-15: 1 via OPHTHALMIC

## 2013-12-15 MED ORDER — TETRACAINE HCL 0.5 % OP SOLN
1.0000 [drp] | OPHTHALMIC | Status: AC
  Administered 2013-12-15 (×3): 1 [drp] via OPHTHALMIC

## 2013-12-15 MED ORDER — NEOMYCIN-POLYMYXIN-DEXAMETH 3.5-10000-0.1 OP SUSP
OPHTHALMIC | Status: DC | PRN
  Administered 2013-12-15: 2 [drp]

## 2013-12-15 MED ORDER — MIDAZOLAM HCL 2 MG/2ML IJ SOLN
1.0000 mg | INTRAMUSCULAR | Status: DC | PRN
  Administered 2013-12-15: 2 mg via INTRAVENOUS

## 2013-12-15 MED ORDER — CYCLOPENTOLATE-PHENYLEPHRINE 0.2-1 % OP SOLN
1.0000 [drp] | OPHTHALMIC | Status: AC
  Administered 2013-12-15 (×3): 1 [drp] via OPHTHALMIC

## 2013-12-15 MED ORDER — ONDANSETRON HCL 4 MG/2ML IJ SOLN: 4.0000 mg | Freq: Once | INTRAMUSCULAR | Status: DC | PRN

## 2013-12-15 MED ORDER — FENTANYL CITRATE 0.05 MG/ML IJ SOLN: 25.0000 ug | INTRAMUSCULAR | Status: DC | PRN

## 2013-12-15 MED ORDER — BSS IO SOLN
INTRAOCULAR | Status: DC | PRN
  Administered 2013-12-15: 15 mL via INTRAOCULAR

## 2013-12-15 MED ORDER — EPINEPHRINE HCL 1 MG/ML IJ SOLN
INTRAOCULAR | Status: DC | PRN
  Administered 2013-12-15: 11:00:00

## 2013-12-15 MED ORDER — FENTANYL CITRATE 0.05 MG/ML IJ SOLN
INTRAMUSCULAR | Status: AC
  Filled 2013-12-15: qty 2

## 2013-12-15 MED ORDER — EPINEPHRINE HCL 1 MG/ML IJ SOLN
INTRAMUSCULAR | Status: AC
  Filled 2013-12-15: qty 1

## 2013-12-15 SURGICAL SUPPLY — 33 items
CAPSULAR TENSION RING-AMO (OPHTHALMIC RELATED) IMPLANT
CLOTH BEACON ORANGE TIMEOUT ST (SAFETY) ×2 IMPLANT
EYE SHIELD UNIVERSAL CLEAR (GAUZE/BANDAGES/DRESSINGS) ×2 IMPLANT
GLOVE BIO SURGEON STRL SZ 6.5 (GLOVE) IMPLANT
GLOVE BIO SURGEONS STRL SZ 6.5 (GLOVE)
GLOVE BIOGEL PI IND STRL 6.5 (GLOVE) IMPLANT
GLOVE BIOGEL PI IND STRL 7.0 (GLOVE) IMPLANT
GLOVE BIOGEL PI IND STRL 7.5 (GLOVE) IMPLANT
GLOVE BIOGEL PI INDICATOR 6.5 (GLOVE)
GLOVE BIOGEL PI INDICATOR 7.0 (GLOVE) ×2
GLOVE BIOGEL PI INDICATOR 7.5 (GLOVE)
GLOVE ECLIPSE 6.5 STRL STRAW (GLOVE) IMPLANT
GLOVE ECLIPSE 7.0 STRL STRAW (GLOVE) IMPLANT
GLOVE ECLIPSE 7.5 STRL STRAW (GLOVE) IMPLANT
GLOVE EXAM NITRILE LRG STRL (GLOVE) IMPLANT
GLOVE EXAM NITRILE MD LF STRL (GLOVE) ×2 IMPLANT
GLOVE SKINSENSE NS SZ6.5 (GLOVE)
GLOVE SKINSENSE NS SZ7.0 (GLOVE)
GLOVE SKINSENSE STRL SZ6.5 (GLOVE) IMPLANT
GLOVE SKINSENSE STRL SZ7.0 (GLOVE) IMPLANT
KIT VITRECTOMY (OPHTHALMIC RELATED) IMPLANT
PAD ARMBOARD 7.5X6 YLW CONV (MISCELLANEOUS) ×2 IMPLANT
PROC W NO LENS (INTRAOCULAR LENS)
PROC W SPEC LENS (INTRAOCULAR LENS)
PROCESS W NO LENS (INTRAOCULAR LENS) IMPLANT
PROCESS W SPEC LENS (INTRAOCULAR LENS) IMPLANT
RING MALYGIN (MISCELLANEOUS) IMPLANT
SIGHTPATH CAT PROC W REG LENS (Ophthalmic Related) ×3 IMPLANT
SYR TB 1ML LL NO SAFETY (SYRINGE) ×2 IMPLANT
TAPE SURG TRANSPORE 1 IN (GAUZE/BANDAGES/DRESSINGS) IMPLANT
TAPE SURGICAL TRANSPORE 1 IN (GAUZE/BANDAGES/DRESSINGS) ×2
VISCOELASTIC ADDITIONAL (OPHTHALMIC RELATED) IMPLANT
WATER STERILE IRR 250ML POUR (IV SOLUTION) ×2 IMPLANT

## 2013-12-15 NOTE — H&P (Signed)
I have reviewed the H&P, the patient was re-examined, and I have identified no interval changes in medical condition and plan of care since the history and physical of record  

## 2013-12-15 NOTE — Anesthesia Procedure Notes (Signed)
Procedure Name: MAC Date/Time: 12/15/2013 10:40 AM Performed by: Andree Elk, Tevion Laforge A Pre-anesthesia Checklist: Timeout performed, Patient identified, Emergency Drugs available, Suction available and Patient being monitored Oxygen Delivery Method: Nasal cannula

## 2013-12-15 NOTE — Discharge Instructions (Signed)
General Anesthesia, Adult, Care After °Refer to this sheet in the next few weeks. These instructions provide you with information on caring for yourself after your procedure. Your health care provider may also give you more specific instructions. Your treatment has been planned according to current medical practices, but problems sometimes occur. Call your health care provider if you have any problems or questions after your procedure. °WHAT TO EXPECT AFTER THE PROCEDURE °After the procedure, it is typical to experience: °· Sleepiness. °· Nausea and vomiting. °HOME CARE INSTRUCTIONS °· For the first 24 hours after general anesthesia: °· Have a responsible person with you. °· Do not drive a car. If you are alone, do not take public transportation. °· Do not drink alcohol. °· Do not take medicine that has not been prescribed by your health care provider. °· Do not sign important papers or make important decisions. °· You may resume a normal diet and activities as directed by your health care provider. °· Change bandages (dressings) as directed. °· If you have questions or problems that seem related to general anesthesia, call the hospital and ask for the anesthetist or anesthesiologist on call. °SEEK MEDICAL CARE IF: °· You have nausea and vomiting that continue the day after anesthesia. °· You develop a rash. °SEEK IMMEDIATE MEDICAL CARE IF:  °· You have difficulty breathing. °· You have chest pain. °· You have any allergic problems. °Document Released: 02/02/2001 Document Revised: 06/29/2013 Document Reviewed: 05/12/2013 °ExitCare® Patient Information ©2014 [employer]. ° ° ° °

## 2013-12-15 NOTE — Transfer of Care (Signed)
Immediate Anesthesia Transfer of Care Note  Patient: Lucas [name]  Procedure(s) Performed: Procedure(s) with comments: CATARACT EXTRACTION PHACO AND INTRAOCULAR LENS PLACEMENT (IOC) (Left) - CDE:13.36  Patient Location: Short Stay  Anesthesia Type:MAC  Level of Consciousness: awake, alert , oriented and patient cooperative  Airway & Oxygen Therapy: Patient Spontanous Breathing  Post-op Assessment: Report given to PACU RN and Post -op Vital signs reviewed and stable  Post vital signs: Reviewed and stable  Complications: No apparent anesthesia complications

## 2013-12-15 NOTE — Anesthesia Postprocedure Evaluation (Signed)
  Anesthesia Post-op Note  Patient: Lucas [name]  Procedure(s) Performed: Procedure(s) with comments: CATARACT EXTRACTION PHACO AND INTRAOCULAR LENS PLACEMENT (IOC) (Left) - CDE:13.36  Patient Location: Short Stay  Anesthesia Type:MAC  Level of Consciousness: awake, alert , oriented and patient cooperative  Airway and Oxygen Therapy: Patient Spontanous Breathing  Post-op Pain: none  Post-op Assessment: Post-op Vital signs reviewed, Patient's Cardiovascular Status Stable, Respiratory Function Stable, Patent Airway, No signs of Nausea or vomiting and Pain level controlled  Post-op Vital Signs: Reviewed and stable  Complications: No apparent anesthesia complications

## 2013-12-15 NOTE — Op Note (Signed)
Date of Admission: 12/15/2013  Date of Surgery: 12/15/2013  Pre-Op Dx: Cataract  Left  Eye  Post-Op Dx: Combined Cataract  Left  Eye,  Dx Code 366.19  Surgeon: [name], M.D.  Assistants: None  Anesthesia: Topical with MAC  Indications: Painless, progressive loss of vision with compromise of daily activities.  Surgery: Cataract Extraction with Intraocular lens Implant Left Eye  Discription: The patient had dilating drops and viscous lidocaine placed into the left eye in the pre-op holding area. After transfer to the operating room, a time out was performed. The patient was then prepped and draped. Beginning with a 72 degree blade a paracentesis port was made at the surgeon's 2 o'clock position. The anterior chamber was then filled with 1% non-preserved lidocaine. This was followed by filling the anterior chamber with Provisc. A 2.23mm keratome blade was used to make a clear corneal incision at the temporal limbus. A bent cystatome needle was used to create a continuous tear capsulotomy. Hydrodissection was performed with balanced salt solution on a Fine canula. The lens nucleus was then removed using the phacoemulsification handpiece. Residual cortex was removed with the I&A handpiece. The anterior chamber and capsular bag were refilled with Provisc. A posterior chamber intraocular lens was placed into the capsular bag with it's injector. The implant was positioned with the Kuglan hook. The Provisc was then removed from the anterior chamber and capsular bag with the I&A handpiece. Stromal hydration of the main incision and paracentesis port was performed with BSS on a Fine canula. The wounds were tested for leak which was negative. The patient tolerated the procedure well. There were no operative complications. The patient was then transferred to the recovery room in stable condition.  Complications: None  Specimen: None  EBL: None  Prosthetic device: B&L enVista, MX60, power 19.0D, SN  [id number].

## 2013-12-15 NOTE — Anesthesia Preprocedure Evaluation (Signed)
Anesthesia Evaluation  Patient identified by MRN, date of birth, ID band Patient awake    Reviewed: Allergy & Precautions, H&P , NPO status , Patient's Chart, lab work & pertinent test results, reviewed documented beta blocker date and time   History of Anesthesia Complications Negative for: history of anesthetic complications  Airway Mallampati: II      Dental  (+) Teeth Intact   Pulmonary sleep apnea ,  breath sounds clear to auscultation        Cardiovascular hypertension, Pt. on medications Rhythm:Regular Rate:Normal     Neuro/Psych    GI/Hepatic   Endo/Other  diabetes, Type 2, Oral Hypoglycemic Agents  Renal/GU      Musculoskeletal   Abdominal   Peds  Hematology   Anesthesia Other Findings   Reproductive/Obstetrics                           Anesthesia Physical Anesthesia Plan  ASA: III  Anesthesia Plan: MAC   Post-op Pain Management:    Induction: Intravenous  Airway Management Planned: Nasal Cannula  Additional Equipment:   Intra-op Plan:   Post-operative Plan:   Informed Consent: I have reviewed the patients History and Physical, chart, labs and discussed the procedure including the risks, benefits and alternatives for the proposed anesthesia with the patient or authorized representative who has indicated his/her understanding and acceptance.     Plan Discussed with:   Anesthesia Plan Comments:         Anesthesia Quick Evaluation

## 2013-12-19 ENCOUNTER — Encounter (HOSPITAL_COMMUNITY): Payer: Self-pay | Admitting: Ophthalmology

## 2014-04-04 ENCOUNTER — Ambulatory Visit: Payer: Self-pay

## 2014-08-11 ENCOUNTER — Encounter: Payer: Self-pay | Admitting: Gastroenterology

## 2016-01-09 DIAGNOSIS — I480 Paroxysmal atrial fibrillation: Secondary | ICD-10-CM

## 2016-01-09 HISTORY — DX: Paroxysmal atrial fibrillation: I48.0

## 2016-01-28 ENCOUNTER — Inpatient Hospital Stay (HOSPITAL_COMMUNITY)
Admission: AD | Admit: 2016-01-28 | Discharge: 2016-01-30 | DRG: 287 | Disposition: A | Discharge status: Home / Self Care | Payer: 59 | Source: Other Acute Inpatient Hospital | Attending: Internal Medicine | Admitting: Internal Medicine

## 2016-01-28 DIAGNOSIS — Z91030 Bee allergy status: Secondary | ICD-10-CM

## 2016-01-28 DIAGNOSIS — I119 Hypertensive heart disease without heart failure: Secondary | ICD-10-CM | POA: Diagnosis present

## 2016-01-28 DIAGNOSIS — Z7984 Long term (current) use of oral hypoglycemic drugs: Secondary | ICD-10-CM | POA: Diagnosis not present

## 2016-01-28 DIAGNOSIS — E785 Hyperlipidemia, unspecified: Secondary | ICD-10-CM | POA: Diagnosis present

## 2016-01-28 DIAGNOSIS — R778 Other specified abnormalities of plasma proteins: Secondary | ICD-10-CM | POA: Insufficient documentation

## 2016-01-28 DIAGNOSIS — I480 Paroxysmal atrial fibrillation: Secondary | ICD-10-CM | POA: Diagnosis present

## 2016-01-28 DIAGNOSIS — E119 Type 2 diabetes mellitus without complications: Secondary | ICD-10-CM | POA: Diagnosis present

## 2016-01-28 DIAGNOSIS — I25118 Atherosclerotic heart disease of native coronary artery with other forms of angina pectoris: Secondary | ICD-10-CM | POA: Diagnosis present

## 2016-01-28 DIAGNOSIS — I10 Essential (primary) hypertension: Secondary | ICD-10-CM | POA: Diagnosis not present

## 2016-01-28 DIAGNOSIS — I4891 Unspecified atrial fibrillation: Secondary | ICD-10-CM | POA: Diagnosis present

## 2016-01-28 DIAGNOSIS — I25119 Atherosclerotic heart disease of native coronary artery with unspecified angina pectoris: Secondary | ICD-10-CM | POA: Diagnosis not present

## 2016-01-28 DIAGNOSIS — Z7982 Long term (current) use of aspirin: Secondary | ICD-10-CM

## 2016-01-28 DIAGNOSIS — Z885 Allergy status to narcotic agent status: Secondary | ICD-10-CM

## 2016-01-28 DIAGNOSIS — G4730 Sleep apnea, unspecified: Secondary | ICD-10-CM | POA: Diagnosis present

## 2016-01-28 DIAGNOSIS — R7989 Other specified abnormal findings of blood chemistry: Secondary | ICD-10-CM | POA: Diagnosis not present

## 2016-01-28 DIAGNOSIS — G4733 Obstructive sleep apnea (adult) (pediatric): Secondary | ICD-10-CM | POA: Diagnosis present

## 2016-01-28 HISTORY — DX: Paroxysmal atrial fibrillation: I48.0

## 2016-01-28 HISTORY — DX: Atherosclerotic heart disease of native coronary artery without angina pectoris: I25.10

## 2016-01-28 LAB — GLUCOSE, CAPILLARY: Glucose-Capillary: 212 mg/dL — ABNORMAL HIGH (ref 65–99)

## 2016-01-29 ENCOUNTER — Inpatient Hospital Stay (HOSPITAL_BASED_OUTPATIENT_CLINIC_OR_DEPARTMENT_OTHER): Payer: 59

## 2016-01-29 ENCOUNTER — Encounter (HOSPITAL_COMMUNITY)
Admission: AD | Disposition: A | Discharge status: Home / Self Care | Payer: Self-pay | Source: Other Acute Inpatient Hospital | Attending: Internal Medicine

## 2016-01-29 ENCOUNTER — Encounter (HOSPITAL_COMMUNITY): Payer: Self-pay | Admitting: *Deleted

## 2016-01-29 DIAGNOSIS — I4891 Unspecified atrial fibrillation: Secondary | ICD-10-CM | POA: Diagnosis present

## 2016-01-29 DIAGNOSIS — I25119 Atherosclerotic heart disease of native coronary artery with unspecified angina pectoris: Secondary | ICD-10-CM

## 2016-01-29 DIAGNOSIS — R778 Other specified abnormalities of plasma proteins: Secondary | ICD-10-CM | POA: Insufficient documentation

## 2016-01-29 DIAGNOSIS — I209 Angina pectoris, unspecified: Secondary | ICD-10-CM

## 2016-01-29 DIAGNOSIS — R7989 Other specified abnormal findings of blood chemistry: Secondary | ICD-10-CM

## 2016-01-29 DIAGNOSIS — I10 Essential (primary) hypertension: Secondary | ICD-10-CM

## 2016-01-29 HISTORY — PX: CARDIAC CATHETERIZATION: SHX172

## 2016-01-29 LAB — LIPID PANEL
Cholesterol: 195 mg/dL (ref 0–200)
HDL: 39 mg/dL — ABNORMAL LOW (ref >40)
LDL Cholesterol: 129 mg/dL — ABNORMAL HIGH (ref 0–99)
Total CHOL/HDL Ratio: 5 RATIO
Triglycerides: 133 mg/dL (ref <150)
VLDL: 27 mg/dL (ref 0–40)

## 2016-01-29 LAB — COMPREHENSIVE METABOLIC PANEL
ALT: 44 U/L (ref 17–63)
AST: 31 U/L (ref 15–41)
Albumin: 3.5 g/dL (ref 3.5–5.0)
Alkaline Phosphatase: 71 U/L (ref 38–126)
Anion gap: 13 (ref 5–15)
BUN: 15 mg/dL (ref 6–20)
CO2: 23 mmol/L (ref 22–32)
Calcium: 9.1 mg/dL (ref 8.9–10.3)
Chloride: 103 mmol/L (ref 101–111)
Creatinine, Ser: 1.23 mg/dL (ref 0.61–1.24)
GFR calc Af Amer: >60 mL/min (ref >60)
GFR calc non Af Amer: >60 mL/min (ref >60)
Glucose, Bld: 278 mg/dL — ABNORMAL HIGH (ref 65–99)
Potassium: 4.9 mmol/L (ref 3.5–5.1)
Sodium: 139 mmol/L (ref 135–145)
Total Bilirubin: 1.2 mg/dL (ref 0.3–1.2)
Total Protein: 6.4 g/dL — ABNORMAL LOW (ref 6.5–8.1)

## 2016-01-29 LAB — BASIC METABOLIC PANEL
Anion gap: 11 (ref 5–15)
BUN: 16 mg/dL (ref 6–20)
CO2: 25 mmol/L (ref 22–32)
Calcium: 9.1 mg/dL (ref 8.9–10.3)
Chloride: 104 mmol/L (ref 101–111)
Creatinine, Ser: 1.11 mg/dL (ref 0.61–1.24)
GFR calc Af Amer: >60 mL/min (ref >60)
GFR calc non Af Amer: >60 mL/min (ref >60)
Glucose, Bld: 145 mg/dL — ABNORMAL HIGH (ref 65–99)
Potassium: 4.5 mmol/L (ref 3.5–5.1)
Sodium: 140 mmol/L (ref 135–145)

## 2016-01-29 LAB — GLUCOSE, CAPILLARY
Glucose-Capillary: 123 mg/dL — ABNORMAL HIGH (ref 65–99)
Glucose-Capillary: 122 mg/dL — ABNORMAL HIGH (ref 65–99)
Glucose-Capillary: 99 mg/dL (ref 65–99)
Glucose-Capillary: 185 mg/dL — ABNORMAL HIGH (ref 65–99)

## 2016-01-29 LAB — CBC
HCT: 48 % (ref 39.0–52.0)
Hemoglobin: 16.4 g/dL (ref 13.0–17.0)
MCH: 31.1 pg (ref 26.0–34.0)
MCHC: 34.2 g/dL (ref 30.0–36.0)
MCV: 91.1 fL (ref 78.0–100.0)
Platelets: 171 10*3/uL (ref 150–400)
RBC: 5.27 MIL/uL (ref 4.22–5.81)
RDW: 13.8 % (ref 11.5–15.5)
WBC: 12.2 10*3/uL — ABNORMAL HIGH (ref 4.0–10.5)

## 2016-01-29 LAB — PROTIME-INR
INR: 1.17 (ref 0.00–1.49)
Prothrombin Time: 15.1 seconds (ref 11.6–15.2)

## 2016-01-29 LAB — TROPONIN I
Troponin I: 0.04 ng/mL — ABNORMAL HIGH (ref <0.031)
Troponin I: 0.04 ng/mL — ABNORMAL HIGH (ref <0.031)
Troponin I: <0.03 ng/mL (ref <0.031)

## 2016-01-29 LAB — TSH: TSH: 1.097 u[IU]/mL (ref 0.350–4.500)

## 2016-01-29 LAB — ECHOCARDIOGRAM COMPLETE
Height: 70 in
Weight: 4342.4 oz

## 2016-01-29 LAB — BRAIN NATRIURETIC PEPTIDE: B Natriuretic Peptide: 184.3 pg/mL — ABNORMAL HIGH (ref 0.0–100.0)

## 2016-01-29 LAB — MAGNESIUM: Magnesium: 1.9 mg/dL (ref 1.7–2.4)

## 2016-01-29 LAB — HEPARIN LEVEL (UNFRACTIONATED): Heparin Unfractionated: 0.7 IU/mL (ref 0.30–0.70)

## 2016-01-29 SURGERY — LEFT HEART CATH AND CORONARY ANGIOGRAPHY

## 2016-01-29 MED ORDER — SODIUM CHLORIDE 0.9% FLUSH: 3.0000 mL | INTRAVENOUS | Status: DC | PRN

## 2016-01-29 MED ORDER — CANAGLIFLOZIN-METFORMIN HCL 150-1000 MG PO TABS: 1.0000 | ORAL_TABLET | Freq: Two times a day (BID) | ORAL | Status: DC

## 2016-01-29 MED ORDER — ACETAMINOPHEN 325 MG PO TABS: 650.0000 mg | ORAL_TABLET | ORAL | Status: DC | PRN

## 2016-01-29 MED ORDER — HEPARIN SODIUM (PORCINE) 1000 UNIT/ML IJ SOLN
INTRAMUSCULAR | Status: AC
  Filled 2016-01-29: qty 1

## 2016-01-29 MED ORDER — SODIUM CHLORIDE 0.9 % WEIGHT BASED INFUSION: 1.0000 mL/kg/h | INTRAVENOUS | Status: AC

## 2016-01-29 MED ORDER — ONDANSETRON HCL 4 MG/2ML IJ SOLN: 4.0000 mg | Freq: Four times a day (QID) | INTRAMUSCULAR | Status: DC | PRN

## 2016-01-29 MED ORDER — HEPARIN (PORCINE) IN NACL 100-0.45 UNIT/ML-% IJ SOLN
1600.0000 [IU]/h | INTRAMUSCULAR | Status: DC
  Administered 2016-01-30: 1600 [IU]/h via INTRAVENOUS
  Filled 2016-01-29: qty 250

## 2016-01-29 MED ORDER — SODIUM CHLORIDE 0.9% FLUSH
3.0000 mL | Freq: Two times a day (BID) | INTRAVENOUS | Status: DC
Start: 2016-01-29 — End: 2016-01-29
  Administered 2016-01-29: 3 mL via INTRAVENOUS

## 2016-01-29 MED ORDER — IOPAMIDOL (ISOVUE-370) INJECTION 76%
INTRAVENOUS | Status: AC
  Filled 2016-01-29: qty 100

## 2016-01-29 MED ORDER — SODIUM CHLORIDE 0.9 % WEIGHT BASED INFUSION: 3.0000 mL/kg/h | INTRAVENOUS | Status: DC

## 2016-01-29 MED ORDER — METOPROLOL SUCCINATE ER 25 MG PO TB24
25.0000 mg | ORAL_TABLET | Freq: Every day | ORAL | Status: DC
  Administered 2016-01-29 – 2016-01-30 (×2): 25 mg via ORAL
  Filled 2016-01-29 (×2): qty 1

## 2016-01-29 MED ORDER — HEPARIN (PORCINE) IN NACL 2-0.9 UNIT/ML-% IJ SOLN
INTRAMUSCULAR | Status: AC
  Filled 2016-01-29: qty 500

## 2016-01-29 MED ORDER — FENTANYL CITRATE (PF) 100 MCG/2ML IJ SOLN
INTRAMUSCULAR | Status: DC | PRN
  Administered 2016-01-29: 50 ug via INTRAVENOUS

## 2016-01-29 MED ORDER — LIDOCAINE HCL (PF) 1 % IJ SOLN
INTRAMUSCULAR | Status: AC
  Filled 2016-01-29: qty 30

## 2016-01-29 MED ORDER — PERFLUTREN LIPID MICROSPHERE
1.0000 mL | INTRAVENOUS | Status: AC | PRN
  Administered 2016-01-29: 3 mL via INTRAVENOUS
  Filled 2016-01-29: qty 10

## 2016-01-29 MED ORDER — IOPAMIDOL (ISOVUE-370) INJECTION 76%
INTRAVENOUS | Status: DC | PRN
  Administered 2016-01-29: 60 mL

## 2016-01-29 MED ORDER — SODIUM CHLORIDE 0.9 % IV SOLN: 250.0000 mL | INTRAVENOUS | Status: DC | PRN

## 2016-01-29 MED ORDER — LIDOCAINE HCL (PF) 1 % IJ SOLN
INTRAMUSCULAR | Status: DC | PRN
  Administered 2016-01-29: 2 mL via INTRADERMAL

## 2016-01-29 MED ORDER — INSULIN DETEMIR 100 UNIT/ML ~~LOC~~ SOLN: 40.0000 [IU] | Freq: Two times a day (BID) | SUBCUTANEOUS | Status: DC

## 2016-01-29 MED ORDER — FENTANYL CITRATE (PF) 100 MCG/2ML IJ SOLN
INTRAMUSCULAR | Status: AC
  Filled 2016-01-29: qty 2

## 2016-01-29 MED ORDER — VERAPAMIL HCL 2.5 MG/ML IV SOLN
INTRAVENOUS | Status: DC | PRN
  Administered 2016-01-29: 10 mL via INTRA_ARTERIAL

## 2016-01-29 MED ORDER — MIDAZOLAM HCL 2 MG/2ML IJ SOLN
INTRAMUSCULAR | Status: AC
  Filled 2016-01-29: qty 2

## 2016-01-29 MED ORDER — HEPARIN SODIUM (PORCINE) 1000 UNIT/ML IJ SOLN
INTRAMUSCULAR | Status: DC | PRN
  Administered 2016-01-29: 6000 [IU] via INTRAVENOUS

## 2016-01-29 MED ORDER — PERFLUTREN LIPID MICROSPHERE
INTRAVENOUS | Status: AC
  Filled 2016-01-29: qty 10

## 2016-01-29 MED ORDER — MIDAZOLAM HCL 2 MG/2ML IJ SOLN
INTRAMUSCULAR | Status: DC | PRN
  Administered 2016-01-29 (×2): 1 mg via INTRAVENOUS

## 2016-01-29 MED ORDER — ASPIRIN EC 81 MG PO TBEC
81.0000 mg | DELAYED_RELEASE_TABLET | Freq: Every day | ORAL | Status: DC
  Administered 2016-01-29 – 2016-01-30 (×2): 81 mg via ORAL
  Filled 2016-01-29 (×2): qty 1

## 2016-01-29 MED ORDER — ASPIRIN 81 MG PO CHEW: 81.0000 mg | CHEWABLE_TABLET | ORAL | Status: DC

## 2016-01-29 MED ORDER — METFORMIN HCL 500 MG PO TABS
1000.0000 mg | ORAL_TABLET | Freq: Two times a day (BID) | ORAL | Status: DC
Start: 2016-01-29 — End: 2016-01-29
  Filled 2016-01-29: qty 2

## 2016-01-29 MED ORDER — HEPARIN (PORCINE) IN NACL 2-0.9 UNIT/ML-% IJ SOLN
INTRAMUSCULAR | Status: DC | PRN
  Administered 2016-01-29: 1500 mL

## 2016-01-29 MED ORDER — SODIUM CHLORIDE 0.9 % WEIGHT BASED INFUSION: 1.0000 mL/kg/h | INTRAVENOUS | Status: DC

## 2016-01-29 MED ORDER — SODIUM CHLORIDE 0.9% FLUSH
3.0000 mL | Freq: Two times a day (BID) | INTRAVENOUS | Status: DC
  Administered 2016-01-29: 3 mL via INTRAVENOUS

## 2016-01-29 MED ORDER — LIRAGLUTIDE 18 MG/3ML ~~LOC~~ SOLN
1.8000 mg | Freq: Every day | SUBCUTANEOUS | Status: DC
Start: 2016-01-29 — End: 2016-01-29

## 2016-01-29 MED ORDER — HEPARIN (PORCINE) IN NACL 2-0.9 UNIT/ML-% IJ SOLN
INTRAMUSCULAR | Status: AC
  Filled 2016-01-29: qty 1000

## 2016-01-29 MED ORDER — HEPARIN (PORCINE) IN NACL 100-0.45 UNIT/ML-% IJ SOLN
1600.0000 [IU]/h | INTRAMUSCULAR | Status: DC
  Administered 2016-01-29: 1600 [IU]/h via INTRAVENOUS
  Filled 2016-01-29: qty 250

## 2016-01-29 MED ORDER — GLIMEPIRIDE 4 MG PO TABS
4.0000 mg | ORAL_TABLET | Freq: Two times a day (BID) | ORAL | Status: DC
  Administered 2016-01-29 – 2016-01-30 (×3): 4 mg via ORAL
  Filled 2016-01-29 (×3): qty 1

## 2016-01-29 MED ORDER — VERAPAMIL HCL 2.5 MG/ML IV SOLN
INTRAVENOUS | Status: AC
  Filled 2016-01-29: qty 2

## 2016-01-29 MED ORDER — HEPARIN BOLUS VIA INFUSION
4000.0000 [IU] | Freq: Once | INTRAVENOUS | Status: AC
  Administered 2016-01-29: 4000 [IU] via INTRAVENOUS
  Filled 2016-01-29: qty 4000

## 2016-01-29 MED ORDER — CANAGLIFLOZIN 300 MG PO TABS
300.0000 mg | ORAL_TABLET | Freq: Every day | ORAL | Status: DC
  Filled 2016-01-29 (×3): qty 1

## 2016-01-29 SURGICAL SUPPLY — 9 items
CATH INFINITI 5 FR JL3.5 (CATHETERS) ×2 IMPLANT
CATH INFINITI JR4 5F (CATHETERS) ×2 IMPLANT
DEVICE RAD COMP TR BAND LRG (VASCULAR PRODUCTS) ×2 IMPLANT
GLIDESHEATH SLEND A-KIT 6F 22G (SHEATH) ×2 IMPLANT
KIT HEART LEFT (KITS) ×3 IMPLANT
PACK CARDIAC CATHETERIZATION (CUSTOM PROCEDURE TRAY) ×3 IMPLANT
TRANSDUCER W/STOPCOCK (MISCELLANEOUS) ×3 IMPLANT
TUBING CIL FLEX 10 FLL-RA (TUBING) ×3 IMPLANT
WIRE SAFE-T 1.5MM-J .035X260CM (WIRE) ×2 IMPLANT

## 2016-01-29 NOTE — Interval H&P Note (Signed)
Cath Lab Visit (complete for each Cath Lab visit)  Clinical Evaluation Leading to the Procedure:   ACS: No.  Non-ACS:    Anginal Classification: CCS III  Anti-ischemic medical therapy: Minimal Therapy (1 class of medications)  Non-Invasive Test Results: No non-invasive testing performed  Prior CABG: No previous CABG      History and Physical Interval Note:  01/29/2016 2:33 PM  Lucas [name]  has presented today for surgery, with the diagnosis of cp  The various methods of treatment have been discussed with the patient and family. After consideration of risks, benefits and other options for treatment, the patient has consented to  Procedure(s): Left Heart Cath and Coronary Angiography ([name]) as a surgical intervention .  The patient's history has been reviewed, patient examined, no change in status, stable for surgery.  I have reviewed the patient's chart and labs.  Questions were answered to the patient's satisfaction.     Sinclair Grooms

## 2016-01-29 NOTE — H&P (Signed)
HPI: 60 yo man with HTN and DM who presents as transfer from [facility] for AF RVR.  He works for [employer]. States that form some time now he has had some CP with exertion.  Today he developed chest discomfort with associated palpitations which prompted visit to his PCP.  There he was noted to be tachycardic and sent to ED where he was found to be in AF RVR.  On arrival here he has converted back to SR. Currently asymptomatic.    Review of Systems:     Cardiac Review of Systems: {Y] = yes [ ]  = no  Chest Pain [    ]  Resting SOB [   ] Exertional SOB  [  ]  Orthopnea [  ]   Pedal Edema [   ]    Palpitations [  ] Syncope  [  ]   Presyncope [   ]  General Review of Systems: [Y] = yes [  ]=no Constitional: recent weight change [  ]; anorexia [  ]; fatigue [  ]; nausea [  ]; night sweats [  ]; fever [  ]; or chills [  ];                                                                      Dental: poor dentition[  ];   Eye : blurred vision [  ]; diplopia [   ]; vision changes [  ];  Amaurosis fugax[  ]; Resp: cough [  ];  wheezing[  ];  hemoptysis[  ]; shortness of breath[  ]; paroxysmal nocturnal dyspnea[  ]; dyspnea on exertion[  ]; or orthopnea[  ];  GI:  gallstones[  ], vomiting[  ];  dysphagia[  ]; melena[  ];  hematochezia [  ]; heartburn[  ];   GU: kidney stones [  ]; hematuria[  ];   dysuria [  ];  nocturia[  ];               Skin: rash [  ], swelling[  ];, hair loss[  ];  peripheral edema[  ];  or itching[  ]; Musculosketetal: myalgias[  ];  joint swelling[  ];  joint erythema[  ];  joint pain[  ];  back pain[  ];  Heme/Lymph: bruising[  ];  bleeding[  ];  anemia[  ];  Neuro: TIA[  ];  headaches[  ];  stroke[  ];  vertigo[  ];  seizures[  ];   paresthesias[  ];  difficulty walking[  ];  Psych:depression[  ]; anxiety[  ];  Endocrine: diabetes[  ];  thyroid dysfunction[  ];  Other:  Past Medical History  Diagnosis Date  . Cataract   . Diabetes mellitus   .  Hypertension   . Hyperlipidemia   . Neuromuscular disorder     carpal tunnel  . Sleep apnea     lost 40 lbs & no longer uses CPAP  . Arthritis     No current facility-administered medications on file prior to encounter.   Current Outpatient Prescriptions on File Prior to Encounter  Medication Sig Dispense Refill  . aspirin 81 MG tablet Take 81 mg by mouth daily.    [name]  benazepril (LOTENSIN) 20 MG tablet Take 20 mg by mouth daily.    . Canagliflozin-Metformin HCl (INVOKAMET) [phone] MG TABS Take 1 tablet by mouth 2 (two) times daily.    [name] glimepiride (AMARYL) 4 MG tablet Take 4 mg by mouth 2 (two) times daily before a meal.     . metoprolol succinate (TOPROL-XL) 50 MG 24 hr tablet Take 25 mg by mouth. Take with or immediately following a meal.       Allergies  Allergen Reactions  . Bee Venom Swelling  . Codeine Nausea Only    Social History   Social History  . Marital Status: Married    Spouse Name: [name]  . Number of Children: [name]  . Years of Education: [name]   Occupational History  . Not on file.   Social History Main Topics  . Smoking status: Never Smoker   . Smokeless tobacco: Never Used  . Alcohol Use: Yes     Comment: rare occasion  . Drug Use: No  . Sexual Activity: Yes    Birth Control/ Protection: None   Other Topics Concern  . Not on file   Social History Narrative  . No narrative on file    Family History  Problem Relation Age of Onset  . Colon cancer Neg Hx   . Rectal cancer Neg Hx   . Stomach cancer Neg Hx     PHYSICAL EXAM: Filed Vitals:   01/28/16 2309  BP: 118/67  Pulse: 72  Temp: 98.3 F (36.8 C)  Resp: 20   General:  Well appearing. No respiratory difficulty HEENT: [place], AT, anicteric, EOMI Neck: supple. no JVD. Carotids 2+ bilat; no bruits. No lymphadenopathy or thryomegaly appreciated. Cor: PMI nondisplaced. Regular rate & rhythm. No rubs, gallops or murmurs. Lungs: clear Abdomen: soft, nontender, nondistended. No  hepatosplenomegaly. No bruits or masses. Good bowel sounds. Extremities: no cyanosis, clubbing, rash, edema Neuro: alert & oriented x 3, cranial nerves grossly intact. moves all 4 extremities w/o difficulty. Affect pleasant.   Results for orders placed or performed during the hospital encounter of 01/28/16 (from the past 24 hour(s))  Glucose, capillary     Status: Abnormal   Collection Time: 01/28/16 11:24 PM  Result Value Ref Range   Glucose-Capillary 212 (H) 65 - 99 mg/dL  Lipid panel     Status: Abnormal   Collection Time: 01/29/16  1:09 AM  Result Value Ref Range   Cholesterol 195 0 - 200 mg/dL   Triglycerides 133 <150 mg/dL   HDL 39 (L) >40 mg/dL   Total CHOL/HDL Ratio 5.0 RATIO   VLDL 27 0 - 40 mg/dL   LDL Cholesterol 129 (H) 0 - 99 mg/dL   No results found.   ASSESSMENT: 60 yo man with HTN and DM presenting with new onset AF, CHADSVASc score of 2, now in SR  PLAN/DISCUSSION: Likely need uptitration of home metoprolol +/- addition of diltiazem Should consider chronic anticoagulation given CHADSVASc score Echo in AM to assess chamber size/function and valves Cycle trop given exertional chest discomfort, if negative may consider non-invasive ischemic eval Continue home anti-hypertensive and anti-hyperglycemic agents

## 2016-01-29 NOTE — Progress Notes (Signed)
[place] for Heparin Indication: Afib  Allergies  Allergen Reactions  . Bee Venom Swelling  . Codeine Nausea Only   Estimated Creatinine Clearance: 91.9 mL/min (by C-G formula based on Cr of 1.11).   Assessment: 60 y.o. male with new onset Afib for heparin  Also with chest pain--> for cath today  Heparin level therapeutic, CBC stable  Hold metformin in preparation for cath / ischemia  Goal of Therapy:  Heparin level 0.3-0.7 units/ml Monitor platelets by anticoagulation protocol: Yes   Plan:  Continue heparin at 1600 units / hr Follow up after cath  Thank you Anette Guarneri, PharmD [phone] 01/29/2016,10:32 AM

## 2016-01-29 NOTE — H&P (View-Only) (Signed)
SUBJECTIVE:  Feels better.  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  He has also been having similar episodes of AFib intermittently over the past few weeks, not necessarily related to exertion.  OBJECTIVE:   Vitals:   Filed Vitals:   01/28/16 2309 01/29/16 0406 01/29/16 0855  BP: 118/67 111/58 130/72  Pulse: 72 73 65  Temp: 98.3 F (36.8 C) 98 F (36.7 C) 97.4 F (36.3 C)  TempSrc: Oral Oral Oral  Resp: 20 20   Height: 5\' 10"  (1.778 m)    Weight: 271 lb 6.4 oz (123.106 kg)    SpO2: 99% 98% 100%   I&O's:   Intake/Output Summary (Last 24 hours) at 01/29/16 0953 Last data filed at 01/29/16 0700  Gross per 24 hour  Intake    360 ml  Output   1180 ml  Net   -820 ml   TELEMETRY: Reviewed telemetry pt in NSR:     PHYSICAL EXAM General: Well developed, well nourished, in no acute distress Head:   Normal cephalic and atramatic  Lungs:  Clear bilaterally to auscultation. Heart:  HRRR S1 S2  No JVD.   Abdomen: abdomen soft and non-tender, obese Msk:  Back normal,  Normal strength and tone for age. Extremities:   No edema.   Neuro: Alert and oriented. Psych:  Normal affect, responds appropriately Skin: No rash   LABS: Basic Metabolic Panel:  Recent Labs  01/29/16 0109 01/29/16 0805  NA 139 140  K 4.9 4.5  CL 103 104  CO2 23 25  GLUCOSE 278* 145*  BUN 15 16  CREATININE 1.23 1.11  CALCIUM 9.1 9.1  MG 1.9  --    Liver Function Tests:  Recent Labs  01/29/16 0109  AST 31  ALT 44  ALKPHOS 71  BILITOT 1.2  PROT 6.4*  ALBUMIN 3.5   No results for input(s): LIPASE, AMYLASE in the last 72 hours. CBC:  Recent Labs  01/29/16 0805  WBC 12.2*  HGB 16.4  HCT 48.0  MCV 91.1  PLT 171   Cardiac Enzymes:  Recent Labs  01/29/16 0109 01/29/16 0805  TROPONINI 0.04* 0.04*   BNP: Invalid input(s): POCBNP D-Dimer: No results for input(s): DDIMER in the last 72 hours. Hemoglobin A1C: No results  for input(s): HGBA1C in the last 72 hours. Fasting Lipid Panel:  Recent Labs  01/29/16 0109  CHOL 195  HDL 39*  LDLCALC 129*  TRIG 133  CHOLHDL 5.0   Thyroid Function Tests:  Recent Labs  01/29/16 0109  TSH 1.097   Anemia Panel: No results for input(s): VITAMINB12, FOLATE, FERRITIN, TIBC, IRON, RETICCTPCT in the last 72 hours. Coag Panel:   No results found for: INR, PROTIME  RADIOLOGY: No results found.    ASSESSMENT: AFib, angina  PLAN:  1) Now in NSR.  This patients CHA2DS2-VASc Score and unadjusted Ischemic Stroke Rate (% per year) is equal to 2.2 % stroke rate/year from a score of 2  Above score calculated as 1 point each if present [CHF, HTN, DM, Vascular=MI/PAD/Aortic Plaque, Age if 65-74, or Male] Above score calculated as 2 points each if present [Age > 75, or Stroke/TIA/TE]  He would benefit from anticoagulation.  NOAC would be preferred.  Will start NOAC after cath.  2) Angina: Discussed stress vs cath.  He had a stress test a few years ago that was negative.  Sx occur every time he walks.  With his RF for  CAD, will plan for cath.  Risks and benefits explained to patient and wife.  He agrees.  All questions answered. Echo pending.    Minimally elevated troponin.      Jettie Booze, MD  01/29/2016  9:53 AM

## 2016-01-29 NOTE — Progress Notes (Signed)
Inpatient Diabetes Program Recommendations  AACE/ADA: New Consensus Statement on Inpatient Glycemic Control (2015)  Target Ranges:  Prepandial:   less than 140 mg/dL      Peak postprandial:   less than 180 mg/dL (1-2 hours)      Critically ill patients:  140 - 180 mg/dL  Results for EGOR, KERSHNER (MRN [id number]) as of 01/29/2016 09:37  Ref. Range 01/28/2016 23:24 01/29/2016 07:43  Glucose-Capillary Latest Ref Range: 65-99 mg/dL 212 (H) 123 (H)   Review of Glycemic Control  Diabetes history: DM2 Outpatient Diabetes medications: Metformin 1 gm bid + Amaryl 4 mg. Qd + Invokamet [phone] mg bid  Inpatient glycemic control: Amaryl 4 mg. q d, Invokana + Metformin (on hold currently)  Inpatient Diabetes Program Recommendations:  Please consider adding Moderate Novolog SSI 0-15 tid with meals. Hold oral diabetes medications while in the hospital.  Nani Gasser. Faheem Ziemann, RN, MSN, CDE Inpatient Glycemic Control Team Team Pager [phone] (8am-5pm) 01/29/2016 9:50 AM

## 2016-01-29 NOTE — Plan of Care (Signed)
Problem: Consults Goal: Cardiac Cath Patient Education (See Patient Education module for education specifics.)  Outcome: Completed/Met Date Met:  01/29/16 Pt educated on cardiac catheterization with possible percutaneous coronary intervention. Cardiac cath video set up in pt's room, pt currently watching. Pt has no further questions at this time. No complaints of pain. Will continue to monitor.

## 2016-01-29 NOTE — Progress Notes (Signed)
SUBJECTIVE:  Feels better.  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  He has also been having similar episodes of AFib intermittently over the past few weeks, not necessarily related to exertion.  OBJECTIVE:   Vitals:   Filed Vitals:   01/28/16 2309 01/29/16 0406 01/29/16 0855  BP: 118/67 111/58 130/72  Pulse: 72 73 65  Temp: 98.3 F (36.8 C) 98 F (36.7 C) 97.4 F (36.3 C)  TempSrc: Oral Oral Oral  Resp: 20 20   Height: 5\' 10"  (1.778 m)    Weight: 271 lb 6.4 oz (123.106 kg)    SpO2: 99% 98% 100%   I&O's:   Intake/Output Summary (Last 24 hours) at 01/29/16 0953 Last data filed at 01/29/16 0700  Gross per 24 hour  Intake    360 ml  Output   1180 ml  Net   -820 ml   TELEMETRY: Reviewed telemetry pt in NSR:     PHYSICAL EXAM General: Well developed, well nourished, in no acute distress Head:   Normal cephalic and atramatic  Lungs:  Clear bilaterally to auscultation. Heart:  HRRR S1 S2  No JVD.   Abdomen: abdomen soft and non-tender, obese Msk:  Back normal,  Normal strength and tone for age. Extremities:   No edema.   Neuro: Alert and oriented. Psych:  Normal affect, responds appropriately Skin: No rash   LABS: Basic Metabolic Panel:  Recent Labs  01/29/16 0109 01/29/16 0805  NA 139 140  K 4.9 4.5  CL 103 104  CO2 23 25  GLUCOSE 278* 145*  BUN 15 16  CREATININE 1.23 1.11  CALCIUM 9.1 9.1  MG 1.9  --    Liver Function Tests:  Recent Labs  01/29/16 0109  AST 31  ALT 44  ALKPHOS 71  BILITOT 1.2  PROT 6.4*  ALBUMIN 3.5   No results for input(s): LIPASE, AMYLASE in the last 72 hours. CBC:  Recent Labs  01/29/16 0805  WBC 12.2*  HGB 16.4  HCT 48.0  MCV 91.1  PLT 171   Cardiac Enzymes:  Recent Labs  01/29/16 0109 01/29/16 0805  TROPONINI 0.04* 0.04*   BNP: Invalid input(s): POCBNP D-Dimer: No results for input(s): DDIMER in the last 72 hours. Hemoglobin A1C: No results  for input(s): HGBA1C in the last 72 hours. Fasting Lipid Panel:  Recent Labs  01/29/16 0109  CHOL 195  HDL 39*  LDLCALC 129*  TRIG 133  CHOLHDL 5.0   Thyroid Function Tests:  Recent Labs  01/29/16 0109  TSH 1.097   Anemia Panel: No results for input(s): VITAMINB12, FOLATE, FERRITIN, TIBC, IRON, RETICCTPCT in the last 72 hours. Coag Panel:   No results found for: INR, PROTIME  RADIOLOGY: No results found.    ASSESSMENT: AFib, angina  PLAN:  1) Now in NSR.  This patients CHA2DS2-VASc Score and unadjusted Ischemic Stroke Rate (% per year) is equal to 2.2 % stroke rate/year from a score of 2  Above score calculated as 1 point each if present [CHF, HTN, DM, Vascular=MI/PAD/Aortic Plaque, Age if 65-74, or Male] Above score calculated as 2 points each if present [Age > 75, or Stroke/TIA/TE]  He would benefit from anticoagulation.  NOAC would be preferred.  Will start NOAC after cath.  2) Angina: Discussed stress vs cath.  He had a stress test a few years ago that was negative.  Sx occur every time he walks.  With his RF for  CAD, will plan for cath.  Risks and benefits explained to patient and wife.  He agrees.  All questions answered. Echo pending.    Minimally elevated troponin.      Jettie Booze, MD  01/29/2016  9:53 AM

## 2016-01-29 NOTE — Progress Notes (Signed)
  Echocardiogram 2D Echocardiogram with Definity has been performed.  Tresa Res 01/29/2016, 1:33 PM

## 2016-01-29 NOTE — Progress Notes (Signed)
ANTICOAGULATION CONSULT NOTE - Initial Consult  Pharmacy Consult for Heparin Indication: Afib  Allergies  Allergen Reactions  . Bee Venom Swelling  . Codeine Nausea Only    Patient Measurements: Height: 5\' 10"  (177.8 cm) IBW/kg (Calculated) : 73 Heparin Dosing Weight: 100 kg  Vital Signs: Temp: 98.3 F (36.8 C) (03/20 2309) Temp Source: Oral (03/20 2309) BP: 118/67 mmHg (03/20 2309) Pulse Rate: 72 (03/20 2309)  Labs (at [facility]): WBC  12.4 Hgb  17.6 Hct  51.1 Plt  213  SCr  1.03 No results for input(s): HGB, HCT, PLT, APTT, LABPROT, INR, HEPARINUNFRC, HEPRLOWMOCWT, CREATININE, CKTOTAL, CKMB, TROPONINI in the last 72 hours.  CrCl cannot be calculated (Unknown ideal weight.).   Medical History: Past Medical History  Diagnosis Date  . Cataract   . Diabetes mellitus   . Hypertension   . Hyperlipidemia   . Neuromuscular disorder     carpal tunnel  . Sleep apnea     lost 40 lbs & no longer uses CPAP  . Arthritis     Medications:  Invokanamet  Amaryl  Victoza  Lotensin  Protonix  Levemir  Toprol XL  ASA  Senokot    Assessment: 60 y.o. male with new onset Afib for heparin  Goal of Therapy:  Heparin level 0.3-0.7 units/ml Monitor platelets by anticoagulation protocol: Yes   Plan:  Heparin 4000 units IV bolus, then start heparin 1600 units/hr Check heparin level in 6 hours.  Sanyia Dini, Bronson Curb 01/29/2016,1:34 AM

## 2016-01-29 NOTE — Progress Notes (Signed)
Kevin for Heparin Indication: Afib  Allergies  Allergen Reactions  . Bee Venom Swelling  . Codeine Nausea Only   Estimated Creatinine Clearance: 91.9 mL/min (by C-G formula based on Cr of 1.11).   Assessment: 60 y.o. male with new onset Afib for heparin. S/p LHC today for chest pain. Pharmacy consulted to resume IV heparin 8 hours post sheath removal for Afib. Per cath lab documentation, sheath removal took place at 1547 today.  H/H and Plt wnl    Goal of Therapy:  Heparin level 0.3-0.7 units/ml Monitor platelets by anticoagulation protocol: Yes   Plan:  -Resume heparin at 1600 units / hr at midnight. No bolus  -f/u 6 hr HL -Monitor daily HL, CBC and s/s of bleeding    Albertina Parr, PharmD., BCPS Clinical Pharmacist Pager [phone]

## 2016-01-29 NOTE — Progress Notes (Signed)
PHARMACIST - PHYSICIAN COMMUNICATION  CONCERNING:  METFORMIN SAFE ADMINISTRATION POLICY  RECOMMENDATION: Metformin has been placed on DISCONTINUE (rejected order) STATUS and should be reordered only after any of the conditions below are ruled out.  Current Safety recommendations include avoiding metformin for a minimum of 48 hours after the patient's exposure to intravenous contrast media for the following conditions:  . eGFR < 60 ml/min  . Liver disease, alcoholism, heart failure, intra-arterial administration of contrast  DESCRIPTION:  The Pharmacy Committee has adopted a policy that restricts the use of metformin in hospitalized patients until all the contraindications to administration have been ruled out. Specific contraindications are: _0  Serum creatinine ? 1.5 for males _1  Serum creatinine ? 1.4 for females _2  Shock, acute MI, sepsis, hypoxemia, dehydration _3  Planned administration of intravenous iodinated contrast media when eGFR < 52m/min, Liver Disease, alcoholism, heart failure or intra-arterial administration of contrast _4  Heart Failure patients with low EF _5  Acute or chronic metabolic acidosis (including DKA)  Thank you LAnette Guarneri PharmD [phone] 01/29/2016 10:34 AM

## 2016-01-30 ENCOUNTER — Encounter (HOSPITAL_COMMUNITY): Payer: Self-pay | Admitting: Physician Assistant

## 2016-01-30 ENCOUNTER — Other Ambulatory Visit: Payer: Self-pay | Admitting: Physician Assistant

## 2016-01-30 DIAGNOSIS — I10 Essential (primary) hypertension: Secondary | ICD-10-CM | POA: Diagnosis present

## 2016-01-30 DIAGNOSIS — I25119 Atherosclerotic heart disease of native coronary artery with unspecified angina pectoris: Secondary | ICD-10-CM | POA: Diagnosis present

## 2016-01-30 DIAGNOSIS — E785 Hyperlipidemia, unspecified: Secondary | ICD-10-CM | POA: Diagnosis present

## 2016-01-30 DIAGNOSIS — G4730 Sleep apnea, unspecified: Secondary | ICD-10-CM | POA: Diagnosis present

## 2016-01-30 DIAGNOSIS — I25118 Atherosclerotic heart disease of native coronary artery with other forms of angina pectoris: Secondary | ICD-10-CM

## 2016-01-30 DIAGNOSIS — E119 Type 2 diabetes mellitus without complications: Secondary | ICD-10-CM

## 2016-01-30 LAB — GLUCOSE, CAPILLARY
Glucose-Capillary: 199 mg/dL — ABNORMAL HIGH (ref 65–99)
Glucose-Capillary: 210 mg/dL — ABNORMAL HIGH (ref 65–99)

## 2016-01-30 LAB — CBC
HCT: 46.4 % (ref 39.0–52.0)
Hemoglobin: 16.1 g/dL (ref 13.0–17.0)
MCH: 31.8 pg (ref 26.0–34.0)
MCHC: 34.7 g/dL (ref 30.0–36.0)
MCV: 91.5 fL (ref 78.0–100.0)
Platelets: 166 10*3/uL (ref 150–400)
RBC: 5.07 MIL/uL (ref 4.22–5.81)
RDW: 13.7 % (ref 11.5–15.5)
WBC: 7.3 10*3/uL (ref 4.0–10.5)

## 2016-01-30 LAB — HEMOGLOBIN A1C
Hgb A1c MFr Bld: 8 % — ABNORMAL HIGH (ref 4.8–5.6)
Mean Plasma Glucose: 183 mg/dL

## 2016-01-30 MED ORDER — ISOSORBIDE MONONITRATE ER 30 MG PO TB24
30.0000 mg | ORAL_TABLET | Freq: Every day | ORAL | Status: DC
  Administered 2016-01-30: 30 mg via ORAL
  Filled 2016-01-30: qty 1

## 2016-01-30 MED ORDER — METOPROLOL SUCCINATE ER 50 MG PO TB24: 50.0000 mg | ORAL_TABLET | Freq: Every day | ORAL | Status: DC

## 2016-01-30 MED ORDER — ISOSORBIDE MONONITRATE ER 30 MG PO TB24: 30.0000 mg | ORAL_TABLET | Freq: Every day | ORAL | Status: DC

## 2016-01-30 MED ORDER — ATORVASTATIN CALCIUM 80 MG PO TABS
80.0000 mg | ORAL_TABLET | Freq: Every day | ORAL | Status: DC
Start: 2016-01-30 — End: 2017-01-21

## 2016-01-30 MED ORDER — RIVAROXABAN 20 MG PO TABS: 20.0000 mg | ORAL_TABLET | Freq: Every day | ORAL | Status: DC

## 2016-01-30 MED ORDER — ATORVASTATIN CALCIUM 80 MG PO TABS: 80.0000 mg | ORAL_TABLET | Freq: Every day | ORAL | Status: DC

## 2016-01-30 NOTE — Discharge Instructions (Signed)
Radial Site Care Refer to this sheet in the next few weeks. These instructions provide you with information on caring for yourself after your procedure. Your caregiver may also give you more specific instructions. Your treatment has been planned according to current medical practices, but problems sometimes occur. Call your caregiver if you have any problems or questions after your procedure. HOME CARE INSTRUCTIONS  You may shower the day after the procedure.Remove the bandage (dressing) and gently wash the site with plain soap and water.Gently pat the site dry.   Do not apply powder or lotion to the site.   Do not submerge the affected site in water for 3 to 5 days.   Inspect the site at least twice daily.   Do not flex or bend the affected arm for 24 hours.   No lifting over 5 pounds (2.3 kg) for 5 days after your procedure.   Do not drive home if you are discharged the same day of the procedure. Have someone else drive you.   You may drive 24 hours after the procedure unless otherwise instructed by your caregiver.  What to expect:  Any bruising will usually fade within 1 to 2 weeks.   Blood that collects in the tissue (hematoma) may be painful to the touch. It should usually decrease in size and tenderness within 1 to 2 weeks.  SEEK IMMEDIATE MEDICAL CARE IF:  You have unusual pain at the radial site.   You have redness, warmth, swelling, or pain at the radial site.   You have drainage (other than a small amount of blood on the dressing).   You have chills.   You have a fever or persistent symptoms for more than 72 hours.   You have a fever and your symptoms suddenly get worse.   Your arm becomes pale, cool, tingly, or numb.   You have heavy bleeding from the site. Hold pressure on the site.    Information on my medicine - XARELTO (Rivaroxaban)  This medication education was reviewed with me or my healthcare representative as part of my discharge preparation.  The  pharmacist that spoke with me during my hospital stay was:  Tad Moore, [facility]  Why was Xarelto prescribed for you? Xarelto was prescribed for you to reduce the risk of a blood clot forming that can cause a stroke if you have a medical condition called atrial fibrillation (a type of irregular heartbeat).  What do you need to know about xarelto ? Take your Xarelto ONCE DAILY at the same time every day with your evening meal. If you have difficulty swallowing the tablet whole, you may crush it and mix in applesauce just prior to taking your dose.  Take Xarelto exactly as prescribed by your doctor and DO NOT stop taking Xarelto without talking to the doctor who prescribed the medication.  Stopping without other stroke prevention medication to take the place of Xarelto may increase your risk of developing a clot that causes a stroke.  Refill your prescription before you run out.  After discharge, you should have regular check-up appointments with your healthcare provider that is prescribing your Xarelto.  In the future your dose may need to be changed if your kidney function or weight changes by a significant amount.  What do you do if you miss a dose? If you are taking Xarelto ONCE DAILY and you miss a dose, take it as soon as you remember on the same day then continue your regularly scheduled once  daily regimen the next day. Do not take two doses of Xarelto at the same time or on the same day.   Important Safety Information A possible side effect of Xarelto is bleeding. You should call your healthcare provider right away if you experience any of the following: ? Bleeding from an injury or your nose that does not stop. ? Unusual colored urine (red or dark brown) or unusual colored stools (red or black). ? Unusual bruising for unknown reasons. ? A serious fall or if you hit your head (even if there is no bleeding).  Some medicines may interact with Xarelto and might increase your  risk of bleeding while on Xarelto. To help avoid this, consult your healthcare provider or pharmacist prior to using any new prescription or non-prescription medications, including herbals, vitamins, non-steroidal anti-inflammatory drugs (NSAIDs) and supplements.  This website has more information on Xarelto: [email or web address]

## 2016-01-30 NOTE — Discharge Summary (Signed)
Discharge Summary    Patient ID: Lucas [name],  MRN: [id number], DOB/AGE: 1956/02/23 60 y.o.  Admit date: 01/28/2016 Discharge date: 01/30/2016  Primary Care Provider: VYAS,DHRUV B. Primary Cardiologist: Dr. Harl Bowie (wants to follow in [place]).    Discharge Diagnoses    Principal Problem:   Atrial fibrillation with rapid ventricular response (HCC) Active Problems:   Elevated troponin   CAD (coronary artery disease)   Hypertension   Hyperlipidemia   Sleep apnea   Diabetes mellitus, type II (HCC)   Allergies Allergies  Allergen Reactions  . Bee Venom Swelling  . Codeine Nausea Only     History of Present Illness     Lucas [name] is a 60 y.o. male with a history of DM, HLD, OSA on CPAP and HTN who was transferred to [facility] from [facility] on 01/29/16 for afib with RVR and chest pain.   He works on the Designer, television/film set at [employer]. He reported a recent history of exertional chest discomfort. On the day of admission he had chest pain and palpitations which prompted a visit to his PCP. He was found to be tachycardic and sent to the [facility] ED. He was found to be in afib with RVR and had a mildly elevated troponin and was transferred to [facility] for further evaluation and treatment.    Hospital Course     Consultants: none.    New onset PAF: he spontaneously converted into NSR. He remains in sinus rhythm currently -- CHADSVASC score of 2. Will start Xarelto 20mg  daily today.  -- Started on Toprol XL 50mg  daily.  -- 2D ECHO with normal LV function and no valvular abnormalities. Mild LVH.  Exertional angina/CAD: his troponin was mildly elevated in the setting of afib with RVR and he reported a hx of exertional chest/back pain. It was decided to proceed with LHC on 01/29/16 which revealed significant apical LAD stenosis, best treated medically. There was moderate disease in the D2 and mid nondominant RCA, normal EF and LV filling pressures. No PCI performed  -- He was  placed on ASA, imdur and a BB. I will add atorvastatin 80mg  daily as well.   HTN: BP well controlled on current regiment on Toprol XL 50mg  daily and imdur 30mg  daily. Will hold his home Benazepril 20mg . If his BP becomes elevated this can be added back as an outpatient.   DMT2: continue home regimen. HgA1c 8. Will need to hold his Invokana-Metformin until 48 hours post cath.   OSA: hasn't been complaint with CPAP. Discussed how weight loss and controlling sleep apnea can help prevent afib recurrence. He plans to get his mask re-fit.   Dispo: I have written him a work note keeping him out of work until he is seen back next Thursday in our [place] office. His wife has also requested a note for the next day or so which I will provide. 30 day Xarelto card given to patient.     The patient has had an uncomplicated hospital course and is recovering well. The radial catheter site is stable. He has been seen by Dr. Irish Lack today and deemed ready for discharge home. All follow-up appointments have been scheduled. A work excuse note was provided as well. Discharge medications are listed below.  _____________  Discharge Vitals Blood pressure 112/74, pulse 79, temperature 98.2 F (36.8 C), temperature source Oral, resp. rate 16, height 5\' 10"  (1.778 m), weight 275 lb (124.739 kg), SpO2 97 %.  Filed Weights  01/28/16 2309 01/30/16 0558  Weight: 271 lb 6.4 oz (123.106 kg) 275 lb (124.739 kg)    Labs & Radiologic Studies     CBC  Recent Labs  01/29/16 0805 01/30/16 0820  WBC 12.2* 7.3  HGB 16.4 16.1  HCT 48.0 46.4  MCV 91.1 91.5  PLT 171 [id number]   Basic Metabolic Panel  Recent Labs  01/29/16 0109 01/29/16 0805  NA 139 140  K 4.9 4.5  CL 103 104  CO2 23 25  GLUCOSE 278* 145*  BUN 15 16  CREATININE 1.23 1.11  CALCIUM 9.1 9.1  MG 1.9  --    Liver Function Tests  Recent Labs  01/29/16 0109  AST 31  ALT 44  ALKPHOS 71  BILITOT 1.2  PROT 6.4*  ALBUMIN 3.5   No results for  input(s): LIPASE, AMYLASE in the last 72 hours. Cardiac Enzymes  Recent Labs  01/29/16 0109 01/29/16 0805 01/29/16 1252  TROPONINI 0.04* 0.04* <0.03   BNP Invalid input(s): POCBNP D-Dimer No results for input(s): DDIMER in the last 72 hours. Hemoglobin A1C  Recent Labs  01/29/16 0109  HGBA1C 8.0*   Fasting Lipid Panel  Recent Labs  01/29/16 0109  CHOL 195  HDL 39*  LDLCALC 129*  TRIG 133  CHOLHDL 5.0   Thyroid Function Tests  Recent Labs  01/29/16 0109  TSH 1.097    No results found.   Diagnostic Studies/Procedures    2D ECHO: 01/29/2016 LV EF: 55% - 60% Study Conclusions - Left ventricle: The cavity size was mildly dilated. Wall  thickness was increased in a pattern of mild LVH. Systolic  function was normal. The estimated ejection fraction was in the  range of 55% to 60%.  01/29/16 Procedures    Left Heart Cath and Coronary Angiography    Conclusion    1. Dist LAD lesion, 80% stenosed. 2. Mid RCA lesion, 60% stenosed. 3. Ost 2nd Diag to 2nd Diag lesion, 60% stenosed. 4. 2nd Mrg lesion, 30% stenosed. 5. Mid LAD lesion, 25% stenosed. 6. LPDA lesion, 45% stenosed.  Significant apical LAD stenosis, best treated with medication. Moderate disease in the second diagonal and mid nondominant RCA.  Normal left ventricular systolic function. Normal left ventricular filling pressures. Recommendations:  Medical therapy of both atrial fibrillation and coronary artery disease. Percutaneous intervention is not currently indicated for coronary disease.         _____________    Disposition   Pt is being discharged home today in good condition.  Follow-up Plans & Appointments    Follow-up Information    Follow up with Carlyle Dolly, MD On 02/07/2016.   Specialty:  Cardiology   Why:  @ 8:20am (accross the street from the [place])   Contact information:   [place]  [address] [phone]        Discharge Medications   Discharge Medication List as of 01/30/2016  2:43 PM    START taking these medications   Details  atorvastatin (LIPITOR) 80 MG tablet Take 1 tablet (80 mg total) by mouth daily at 6 PM., Starting 01/30/2016, Until Discontinued, Normal    isosorbide mononitrate (IMDUR) 30 MG 24 hr tablet Take 1 tablet (30 mg total) by mouth daily., Starting 01/30/2016, Until Discontinued, Normal    Xarelto 20mg                      Take 1 tablet by mouth nightly.   CONTINUE these medications  which have CHANGED   Details  metoprolol succinate (TOPROL-XL) 50 MG 24 hr tablet Take 1 tablet (50 mg total) by mouth daily. Take with or immediately following a meal., Starting 01/30/2016, Until Discontinued, Normal      CONTINUE these medications which have NOT CHANGED   Details  aspirin 81 MG tablet Take 81 mg by mouth daily., Until Discontinued, Historical Med    Canagliflozin-Metformin HCl (INVOKAMET) [phone] MG TABS Take 1 tablet by mouth 2 (two) times daily., Until Discontinued, Historical Med    glimepiride (AMARYL) 4 MG tablet Take 4 mg by mouth daily with breakfast. , Starting 07/25/2012, Until Discontinued, Historical Med    pantoprazole (PROTONIX) 40 MG tablet Take 40 mg by mouth daily., Until Discontinued, Historical Med      STOP taking these medications     benazepril (LOTENSIN) 20 MG tablet      ibuprofen (ADVIL,MOTRIN) 200 MG tablet      Phenylephrine-Ibuprofen (ADVIL SINUS CONGESTION & PAIN) 10-200 MG TABS          Aspirin prescribed at discharge?  Yes High Intensity Statin Prescribed? (Lipitor 40-80mg  or Crestor 20-40mg ): Yes Beta Blocker Prescribed? Yes For EF 45% or less, Was ACEI/ARB Prescribed? No, [name] ADP Receptor Inhibitor Prescribed? (i.e. Plavix etc.-Includes Medically Managed Patients): No, n/a For EF <40%, Aldosterone Inhibitor Prescribed? No, n/a Was EF assessed during THIS hospitalization? Yes Was Cardiac Rehab II  ordered? No, [name]    Outstanding Labs/Studies   None.  Duration of Discharge Encounter   Greater than 30 minutes including physician time.  SignedAngelena Form R PA-C 01/30/2016, 4:27 PM  I have examined the patient and reviewed assessment and plan and discussed with patient. Agree with above as stated.  I personally reviewed the cath films.  Apical LAD disease-small vessel at that point. Increase beta blocker and start nitrate for angina. Encouraged re-eval of OSA. He may need a different CPAP setting. He will f/u with Dr. Harl Bowie. Plan discharge later today.  Atorvastatin for hyperlipidemia.  Annessa Satre S.

## 2016-01-30 NOTE — Progress Notes (Signed)
Patient being discharged per MD order. All discharge instructions will be given to both the patient and his wife. Also included in discharge packet is a work note for both the patient and his wife. A copy was placed in the patients chart.

## 2016-01-30 NOTE — Plan of Care (Signed)
Problem: Phase II Progression Outcomes Goal: Ambulates up to 600 ft. in hall x 1 Outcome: Completed/Met Date Met:  01/30/16 Patient has ambulated

## 2016-01-30 NOTE — Care Management Note (Addendum)
Case Management Note  Patient Details  Name: Lucas [name] MRN: [id number] Date of Birth: 10-22-1956  Subjective/Objective:        Pt admitted for AFib. Plan for d/c home today on Xarelto.            Action/Plan: Pt given 30 day free/ zero co pay card. Co pay is 75.00. No further needs from CM at this time. [facility] [place] has medication available.    Expected Discharge Date:                  Expected Discharge Plan:  Home/Self Care  In-House Referral:  NA  Discharge planning Services  CM Consult, Medication Assistance  Post Acute Care Choice:  NA Choice offered to:  NA  DME Arranged:  [name] DME Agency:  NA  HH Arranged:  NA HH Agency:  NA  Status of Service:  Completed, signed off  Medicare Important Message Given:    Date Medicare IM Given:    Medicare IM give by:    Date Additional Medicare IM Given:    Additional Medicare Important Message give by:     If discussed at [place] of Stay Meetings, dates discussed:    Additional Comments:  Bethena Roys, RN 01/30/2016, 1:25 PM

## 2016-01-30 NOTE — Progress Notes (Signed)
Patient Name: Lucas [name] Date of Encounter: 01/30/2016     Active Problems:   Atrial fibrillation with rapid ventricular response (HCC)   Angina pectoris (HCC)   Elevated troponin    SUBJECTIVE  Feeling well. Ready to go home.   CURRENT MEDS . aspirin EC  81 mg Oral Daily  . canagliflozin  300 mg Oral QAC breakfast  . glimepiride  4 mg Oral BID AC  . isosorbide mononitrate  30 mg Oral Daily  . [START ON 01/31/2016] metoprolol succinate  50 mg Oral Daily  . sodium chloride flush  3 mL Intravenous Q12H    OBJECTIVE  Filed Vitals:   01/29/16 2006 01/30/16 0558 01/30/16 0800 01/30/16 0854  BP: 129/78 112/74  112/74  Pulse: 68   79  Temp: 98 F (36.7 C) 98.2 F (36.8 C)    TempSrc: Oral Oral    Resp: 18 16    Height:      Weight:  275 lb (124.739 kg)    SpO2:  96% 97%     Intake/Output Summary (Last 24 hours) at 01/30/16 1135 Last data filed at 01/30/16 0800  Gross per 24 hour  Intake 1178.53 ml  Output   2350 ml  Net -1171.47 ml   Filed Weights   01/28/16 2309 01/30/16 0558  Weight: 271 lb 6.4 oz (123.106 kg) 275 lb (124.739 kg)    PHYSICAL EXAM  General: Pleasant, NAD. obese Neuro: Alert and oriented X 3. Moves all extremities spontaneously. Psych: Normal affect. HEENT:  Normal  Neck: Supple without bruits or JVD. Lungs:  Resp regular and unlabored, CTA. Heart: RRR no s3, s4, or murmurs. Abdomen: Soft, non-tender, non-distended, BS + x 4.  Extremities: No clubbing, cyanosis or edema. DP/PT/Radials 2+ and equal bilaterally.  Accessory Clinical Findings  CBC  Recent Labs  01/29/16 0805 01/30/16 0820  WBC 12.2* 7.3  HGB 16.4 16.1  HCT 48.0 46.4  MCV 91.1 91.5  PLT 171 [id number]   Basic Metabolic Panel  Recent Labs  01/29/16 0109 01/29/16 0805  NA 139 140  K 4.9 4.5  CL 103 104  CO2 23 25  GLUCOSE 278* 145*  BUN 15 16  CREATININE 1.23 1.11  CALCIUM 9.1 9.1  MG 1.9  --    Liver Function Tests  Recent Labs  01/29/16 0109  AST 31   ALT 44  ALKPHOS 71  BILITOT 1.2  PROT 6.4*  ALBUMIN 3.5   No results for input(s): LIPASE, AMYLASE in the last 72 hours. Cardiac Enzymes  Recent Labs  01/29/16 0109 01/29/16 0805 01/29/16 1252  TROPONINI 0.04* 0.04* <0.03   Hemoglobin A1C  Recent Labs  01/29/16 0109  HGBA1C 8.0*   Fasting Lipid Panel  Recent Labs  01/29/16 0109  CHOL 195  HDL 39*  LDLCALC 129*  TRIG 133  CHOLHDL 5.0   Thyroid Function Tests  Recent Labs  01/29/16 0109  TSH 1.097    TELE  NSR  Radiology/Studies  2D ECHO: 01/29/2016 LV EF: 55% - 60% Study Conclusions - Left ventricle: The cavity size was mildly dilated. Wall  thickness was increased in a pattern of mild LVH. Systolic  function was normal. The estimated ejection fraction was in the  range of 55% to 60%.  01/29/16 Procedures    Left Heart Cath and Coronary Angiography    Conclusion    1. Dist LAD lesion, 80% stenosed. 2. Mid RCA lesion, 60% stenosed. 3. Ost 2nd Diag to 2nd Diag lesion, 60%  stenosed. 4. 2nd Mrg lesion, 30% stenosed. 5. Mid LAD lesion, 25% stenosed. 6. LPDA lesion, 45% stenosed.  Significant apical LAD stenosis, best treated with medication. Moderate disease in the second diagonal and mid nondominant RCA.  Normal left ventricular systolic function. Normal left ventricular filling pressures. Recommendations:  Medical therapy of both atrial fibrillation and coronary artery disease. Percutaneous intervention is not currently indicated for coronary disease.      ASSESSMENT AND PLAN  Lucas [name] is a 60 y.o. male with a history of DM, OSA on CPAP and HTN who was transferred to [facility] from [facility] on 01/29/16 for afib with RVR and chest pain.   New onset PAF: he spontaneously converted into NSR. He remains in sinus rhythm currently -- CHADSVASC score of 2. Will start Xarelto 20mg  daily today.  -- Started on Toprol XL 50mg  daily.  -- 2D ECHO with normal LV function and no  valvular abnormalities. Mild LVH.  Exertional angina/CAD: his troponin was mildly elevated in the setting of afib with RVR and he reported a hx of exertional chest/back pain. It was decided to proceed with LHC on 01/29/16 which revealed significant apical LAD stenosis, best treated medically. There was moderate disease in the D2 and mid nondominant RCA, normal EF and LV filling pressures. No PCI performed  -- He was placed on ASA, imdur and a BB. I will add atorvastatin 80mg  daily as well.   HTN: BP well controlled on current regiment on Toprol XL 50mg  daily and imdur 30mg  daily. Will hold his home Benazepril 20mg . If his BP becomes elevated this can be added back as an outpatient.   DMT2: continue home regimen. Will need to hold his Invokana-Metformin until 48 hours post cath.   OSA: hasn't been complaint with CPAP. Discussed how weight loss and controlling sleep apnea can help prevent afib recurrence.   Dispo: I have written him a work note keeping him out of work until he is seen back next Thursday in our [place] office. His wife has also requested a note for the next day or so which I will provide. 30 day Xarelto card given to patient.   SignedEileen Stanford PA-C  Pager [phone]  I have examined the patient and reviewed assessment and plan and discussed with patient.  Agree with above as stated.  Increase beta blocker and start nitrate for angina.  Encouraged re-eval of OSA.  He may need a different CPAP setting.  He will f/u with Dr. Harl Bowie.  Plan discharge later today.  Damika Harmon S.

## 2016-02-01 ENCOUNTER — Telehealth: Payer: Self-pay | Admitting: Cardiology

## 2016-02-01 NOTE — Telephone Encounter (Signed)
Returned call to patient.  Stated that his bp has been elevated.  Last evening woke up in middle of night, felt like heart was pounding.  Stated that his BP at 3:20 am was 179/102  73 (standing).  Checked again - 153/99  65.  Then, again at 4:00 - 162/86.  This morning at 9:00 -  165/85 & at 10:38 - 168/79  60.  Stated that his BP med was changed from Lopressor to Toprol XL.  Informed patient that medication was the same, one short acting & the other long acting.  Also, explained to him that checking BP repeatedly back to back can give him falsely elevated numbers.  Advised to be sitting still for at least 5 minutes with arm at heart level.  Encouraged him to keep BP log till his OV on Thursday, 02/07/2016.  This will be a post hospital visit for him (new to [place]).  Also, informed patient to call back before then if symptoms worsen.  He verbalized understanding.

## 2016-02-01 NOTE — Telephone Encounter (Signed)
Wanting to speak with nurses BP running high 173/93. Stated they made medication changes and he feels this may be causing.

## 2016-02-04 NOTE — Telephone Encounter (Signed)
Pt aware and will restart benazepril 20 mg daily. Says he still some of this medication left and didn't want refills at this time. Pt confirmed 3/30 appt with Dr. Harl Bowie. Updated medication list

## 2016-02-04 NOTE — Telephone Encounter (Signed)
From hospital note they also stopped his benazepril at discharge. Can restart benaezepril 20mg  daily   Zandra Abts MD

## 2016-02-07 ENCOUNTER — Encounter: Payer: Self-pay | Admitting: *Deleted

## 2016-02-07 ENCOUNTER — Encounter: Payer: Self-pay | Admitting: Cardiology

## 2016-02-07 ENCOUNTER — Ambulatory Visit (INDEPENDENT_AMBULATORY_CARE_PROVIDER_SITE_OTHER): Payer: 59 | Admitting: Cardiology

## 2016-02-07 VITALS — BP 113/72 | HR 57 | Ht 70.0 in | Wt 281.0 lb

## 2016-02-07 DIAGNOSIS — I10 Essential (primary) hypertension: Secondary | ICD-10-CM

## 2016-02-07 DIAGNOSIS — R0789 Other chest pain: Secondary | ICD-10-CM | POA: Diagnosis not present

## 2016-02-07 DIAGNOSIS — I4891 Unspecified atrial fibrillation: Secondary | ICD-10-CM | POA: Diagnosis not present

## 2016-02-07 DIAGNOSIS — G4730 Sleep apnea, unspecified: Secondary | ICD-10-CM | POA: Diagnosis not present

## 2016-02-07 NOTE — Patient Instructions (Signed)
Your physician recommends that you schedule a follow-up appointment in: 3 MONTHS WITH DR. [place]  Your physician recommends that you continue on your current medications as directed. Please refer to the Current Medication list given to you today.  You have been referred to DR. HAWKINS   WE HAVE GIVEN YOU A WORK NOTE TO RETURN ON Monday  Thank you for choosing [facility]!!

## 2016-02-07 NOTE — Progress Notes (Signed)
Patient ID: Lucas [name], male   DOB: Jun 20, 1956, 60 y.o.   MRN: [id number]     Clinical Summary Lucas [name] is a 60 y.o.male seen today for hospital follow up, this is our first visit together.   1. Afib - admit 01/2016 with palpitations and chest pain. Found to be in afib with RVR, first known episode - converted on his own back to NSR.  - CHADS2Vasc score of 2, started on xarelto. Started on Toprol XL for rate control  - since discharge denies any palpitations - compliant with meds   2. Chest pain - episode occurred in the setting of afib with RVR - cath 01/29/16 with apical LAD disease, treated medically. Otherwise diffuse mild to moderate disease -no recurrent chest pain  3. HTN - compliant with meds  4. OSA - noncompliant with CPAP  Past Medical History  Diagnosis Date  . Cataract   . Diabetes mellitus   . Hypertension   . Hyperlipidemia   . Sleep apnea     lost 40 lbs & no longer uses CPAP  . Arthritis   . PAF (paroxysmal atrial fibrillation) ([place]) 01/2016  . CAD (coronary artery disease)     a. LHC on 01/29/16 which revealed significant apical LAD stenosis, best treated medically. There was moderate disease in the D2 and mid nondominant RCA.[name] No PCI performed      Allergies  Allergen Reactions  . Bee Venom Swelling  . Codeine Nausea Only     Current Outpatient Prescriptions  Medication Sig Dispense Refill  . aspirin 81 MG tablet Take 81 mg by mouth daily.    [name] atorvastatin (LIPITOR) 80 MG tablet Take 1 tablet (80 mg total) by mouth daily at 6 PM. 90 tablet 3  . benazepril (LOTENSIN) 20 MG tablet Take 20 mg by mouth daily.    . Canagliflozin-Metformin HCl (INVOKAMET) [id number] MG TABS Take 1 tablet by mouth 2 (two) times daily.    [name] glimepiride (AMARYL) 4 MG tablet Take 4 mg by mouth daily with breakfast.     . isosorbide mononitrate (IMDUR) 30 MG 24 hr tablet Take 1 tablet (30 mg total) by mouth daily. 90 tablet 3  . metoprolol succinate (TOPROL-XL) 50 MG  24 hr tablet Take 1 tablet (50 mg total) by mouth daily. Take with or immediately following a meal. 90 tablet 3  . pantoprazole (PROTONIX) 40 MG tablet Take 40 mg by mouth daily.    . rivaroxaban (XARELTO) 20 MG TABS tablet Take 1 tablet (20 mg total) by mouth daily with supper. 30 tablet 11   No current facility-administered medications for this visit.     Past Surgical History  Procedure Laterality Date  . Colonoscopy    . Polypectomy    . Tonsillectomy    . Nasal sinus surgery    . Ankle surgery Left 2012    tendon repair  . Left knee sugery Left     Arthroscopy  . Cystoscopy w/ ureteral stent placement      right  . Cataract extraction w/phaco  11/11/2012    Procedure: CATARACT EXTRACTION PHACO AND INTRAOCULAR LENS PLACEMENT (IOC);  Surgeon: Tonny Aneira Cavitt, MD;  Location: [facility];  Service: Ophthalmology;  Laterality: Right;  CDE:  5.56  . Cataract extraction w/phaco Left 12/15/2013    Procedure: CATARACT EXTRACTION PHACO AND INTRAOCULAR LENS PLACEMENT (IOC);  Surgeon: Tonny Adah Stoneberg, MD;  Location: [facility];  Service: Ophthalmology;  Laterality: Left;  CDE:13.36  . Cholecystectomy    .  Cardiac catheterization [name] 01/29/2016    Procedure: Left Heart Cath and Coronary Angiography;  Surgeon: [name], MD;  Location: [place] [facility];  Service: Cardiovascular;  Laterality: [name];     Allergies  Allergen Reactions  . Bee Venom Swelling  . Codeine Nausea Only      Family History  Problem Relation Age of Onset  . Colon cancer Neg Hx   . Rectal cancer Neg Hx   . Stomach cancer Neg Hx      Social History Lucas [name] reports that he has never smoked. He has never used smokeless tobacco. Lucas [name] reports that he drinks alcohol.   Review of Systems CONSTITUTIONAL: No weight loss, fever, chills, weakness or fatigue.  HEENT: Eyes: No visual loss, blurred vision, double vision or yellow sclerae.No hearing loss, sneezing, congestion, runny nose or sore throat.  SKIN: No rash or  itching.  CARDIOVASCULAR: per HPI RESPIRATORY: No shortness of breath, cough or sputum.  GASTROINTESTINAL: No anorexia, nausea, vomiting or diarrhea. No abdominal pain or blood.  GENITOURINARY: No burning on urination, no polyuria NEUROLOGICAL: No headache, dizziness, syncope, paralysis, ataxia, numbness or tingling in the extremities. No change in bowel or bladder control.  MUSCULOSKELETAL: No muscle, back pain, joint pain or stiffness.  LYMPHATICS: No enlarged nodes. No history of splenectomy.  PSYCHIATRIC: No history of depression or anxiety.  ENDOCRINOLOGIC: No reports of sweating, cold or heat intolerance. No polyuria or polydipsia.  [name]   Physical Examination Filed Vitals:   02/07/16 0821  BP: 113/72  Pulse: 57   Filed Vitals:   02/07/16 0821  Height: 5\' 10"  (1.778 m)  Weight: 281 lb (127.461 kg)    Gen: resting comfortably, no acute distress HEENT: no scleral icterus, pupils equal round and reactive, no palptable cervical adenopathy,  CV: RRR, no m/r/g, no jvd Resp: Clear to auscultation bilaterally GI: abdomen is soft, non-tender, non-distended, normal bowel sounds, no hepatosplenomegaly MSK: extremities are warm, no edema.  Skin: warm, no rash Neuro:  no focal deficits Psych: appropriate affect   Diagnostic Studies 01/2016 echo Study Conclusions  - Left ventricle: The cavity size was mildly dilated. Wall  thickness was increased in a pattern of mild LVH. Systolic  function was normal. The estimated ejection fraction was in the  range of 55% to 60%.  01/2016 cath 1. Dist LAD lesion, 80% stenosed. 2. Mid RCA lesion, 60% stenosed. 3. Ost 2nd Diag to 2nd Diag lesion, 60% stenosed. 4. 2nd Mrg lesion, 30% stenosed. 5. Mid LAD lesion, 25% stenosed. 6. LPDA lesion, 45% stenosed.   Significant apical LAD stenosis, best treated with medication. Moderate disease in the second diagonal and mid nondominant RCA.  Normal left ventricular systolic function. Normal  left ventricular filling pressures.  Recommendations:   Medical therapy of both atrial fibrillation and coronary artery disease. Percutaneous intervention is not currently indicated for coronary disease.    Assessment and Plan  1. Afib - no current symptoms, continue current meds including xarelto for  Stroke prevention  2. Chest pain - episode occurred in setting of afib with RVR, elevated troponin. Cath without significant disease - continue risk factor modification  3. HTN - at goal, continue currne tmeds   4. OSA - noncompliant with CPAP for several years. Will refer to Dr Luan Pulling to consider repeat sleep testing and potential alterations of current CPAP   F/u 3 months. We will write a work note clearing him until Monday.       Arnoldo Lenis,  M.D.  

## 2016-02-12 ENCOUNTER — Emergency Department (HOSPITAL_COMMUNITY): Payer: 59

## 2016-02-12 ENCOUNTER — Encounter (HOSPITAL_COMMUNITY): Payer: Self-pay | Admitting: Emergency Medicine

## 2016-02-12 ENCOUNTER — Emergency Department (HOSPITAL_COMMUNITY)
Admission: EM | Admit: 2016-02-12 | Discharge: 2016-02-13 | Disposition: A | Discharge status: Home / Self Care | Payer: 59 | Attending: Emergency Medicine | Admitting: Emergency Medicine

## 2016-02-12 DIAGNOSIS — I2510 Atherosclerotic heart disease of native coronary artery without angina pectoris: Secondary | ICD-10-CM | POA: Diagnosis not present

## 2016-02-12 DIAGNOSIS — Z7982 Long term (current) use of aspirin: Secondary | ICD-10-CM | POA: Diagnosis not present

## 2016-02-12 DIAGNOSIS — E11618 Type 2 diabetes mellitus with other diabetic arthropathy: Secondary | ICD-10-CM | POA: Diagnosis not present

## 2016-02-12 DIAGNOSIS — M25551 Pain in right hip: Secondary | ICD-10-CM | POA: Diagnosis present

## 2016-02-12 DIAGNOSIS — Z7984 Long term (current) use of oral hypoglycemic drugs: Secondary | ICD-10-CM | POA: Insufficient documentation

## 2016-02-12 DIAGNOSIS — I10 Essential (primary) hypertension: Secondary | ICD-10-CM | POA: Insufficient documentation

## 2016-02-12 DIAGNOSIS — R109 Unspecified abdominal pain: Secondary | ICD-10-CM

## 2016-02-12 DIAGNOSIS — Z79899 Other long term (current) drug therapy: Secondary | ICD-10-CM | POA: Insufficient documentation

## 2016-02-12 DIAGNOSIS — N50819 Testicular pain, unspecified: Secondary | ICD-10-CM | POA: Diagnosis not present

## 2016-02-12 DIAGNOSIS — M47816 Spondylosis without myelopathy or radiculopathy, lumbar region: Secondary | ICD-10-CM

## 2016-02-12 DIAGNOSIS — M1990 Unspecified osteoarthritis, unspecified site: Secondary | ICD-10-CM | POA: Diagnosis not present

## 2016-02-12 DIAGNOSIS — E785 Hyperlipidemia, unspecified: Secondary | ICD-10-CM | POA: Diagnosis not present

## 2016-02-12 LAB — COMPREHENSIVE METABOLIC PANEL
ALT: 31 U/L (ref 17–63)
AST: 30 U/L (ref 15–41)
Albumin: 3.7 g/dL (ref 3.5–5.0)
Alkaline Phosphatase: 90 U/L (ref 38–126)
Anion gap: 9 (ref 5–15)
BUN: 19 mg/dL (ref 6–20)
CO2: 23 mmol/L (ref 22–32)
Calcium: 8.6 mg/dL — ABNORMAL LOW (ref 8.9–10.3)
Chloride: 106 mmol/L (ref 101–111)
Creatinine, Ser: 0.83 mg/dL (ref 0.61–1.24)
GFR calc Af Amer: >60 mL/min (ref >60)
GFR calc non Af Amer: >60 mL/min (ref >60)
Glucose, Bld: 173 mg/dL — ABNORMAL HIGH (ref 65–99)
Potassium: 4.1 mmol/L (ref 3.5–5.1)
Sodium: 138 mmol/L (ref 135–145)
Total Bilirubin: 1.1 mg/dL (ref 0.3–1.2)
Total Protein: 6.8 g/dL (ref 6.5–8.1)

## 2016-02-12 LAB — CBC WITH DIFFERENTIAL/PLATELET
Basophils Absolute: 0.1 10*3/uL (ref 0.0–0.1)
Basophils Relative: 1 %
Eosinophils Absolute: 0.2 10*3/uL (ref 0.0–0.7)
Eosinophils Relative: 2 %
HCT: 43.5 % (ref 39.0–52.0)
Hemoglobin: 15.1 g/dL (ref 13.0–17.0)
Lymphocytes Relative: 43 %
Lymphs Abs: 3.8 10*3/uL (ref 0.7–4.0)
MCH: 31.5 pg (ref 26.0–34.0)
MCHC: 34.7 g/dL (ref 30.0–36.0)
MCV: 90.6 fL (ref 78.0–100.0)
Monocytes Absolute: 0.9 10*3/uL (ref 0.1–1.0)
Monocytes Relative: 10 %
Neutro Abs: 3.8 10*3/uL (ref 1.7–7.7)
Neutrophils Relative %: 44 %
Platelets: 149 10*3/uL — ABNORMAL LOW (ref 150–400)
RBC: 4.8 MIL/uL (ref 4.22–5.81)
RDW: 13.5 % (ref 11.5–15.5)
WBC: 8.8 10*3/uL (ref 4.0–10.5)

## 2016-02-12 MED ORDER — SODIUM CHLORIDE 0.9 % IV SOLN
INTRAVENOUS | Status: DC
  Administered 2016-02-12: 23:00:00 via INTRAVENOUS

## 2016-02-12 NOTE — ED Provider Notes (Signed)
CSN: [id number]     Arrival date & time 02/12/16  [id number] History  By signing my name below, I, Lucas [name], attest that this documentation has been prepared under the direction and in the presence of physician practitioner, Dorie Rank, MD,. Electronically Signed: Dora [name], Scribe. 02/12/2016. 10:26 PM.     Chief Complaint  Patient presents with  . Hip Pain    The history is provided by the patient and the spouse. No language interpreter was used.     HPI Comments: Lucas [name] is a 60 y.o. male with h/o DM, HTN, HLD, PAF, CAD, kidney stones, and arthritis who presents to the Emergency Department complaining of sudden onset, constant, improving, right groin and bilateral testicular pain beginning a couple of hours ago. Pt notes that he experienced severe lower back pain that radiated into his right hip, right groin, and bilateral testicles; he notes that he felt like he was going to pass out due to pain. He notes that his symptoms have improved and he is only experiencing mild lower back soreness currently. Pt denies pain exacerbation with palpation to his right groin. Pt had a heart cath two weeks ago. He has no h/o abdominal surgery. He denies nausea, vomiting, hematuria, fever, chills, testicular swelling, or any other associated symptoms.  Past Medical History  Diagnosis Date  . Cataract   . Diabetes mellitus   . Hypertension   . Hyperlipidemia   . Sleep apnea     lost 40 lbs & no longer uses CPAP  . Arthritis   . PAF (paroxysmal atrial fibrillation) ([place]) 01/2016  . CAD (coronary artery disease)     a. LHC on 01/29/16 which revealed significant apical LAD stenosis, best treated medically. There was moderate disease in the D2 and mid nondominant RCA.[name] No PCI performed    Past Surgical History  Procedure Laterality Date  . Colonoscopy    . Polypectomy    . Tonsillectomy    . Nasal sinus surgery    . Ankle surgery Left 2012    tendon repair  . Left knee sugery Left      Arthroscopy  . Cystoscopy w/ ureteral stent placement      right  . Cataract extraction w/phaco  11/11/2012    Procedure: CATARACT EXTRACTION PHACO AND INTRAOCULAR LENS PLACEMENT (IOC);  Surgeon: [name], MD;  Location: [facility];  Service: Ophthalmology;  Laterality: Right;  CDE:  5.56  . Cataract extraction w/phaco Left 12/15/2013    Procedure: CATARACT EXTRACTION PHACO AND INTRAOCULAR LENS PLACEMENT (IOC);  Surgeon: [name], MD;  Location: [facility];  Service: Ophthalmology;  Laterality: Left;  CDE:13.36  . Cholecystectomy    . Cardiac catheterization [name] 01/29/2016    Procedure: Left Heart Cath and Coronary Angiography;  Surgeon: [name], MD;  Location: [place] [facility];  Service: Cardiovascular;  Laterality: [name];   Family History  Problem Relation Age of Onset  . Colon cancer Neg Hx   . Rectal cancer Neg Hx   . Stomach cancer Neg Hx    Social History  Substance Use Topics  . Smoking status: Never Smoker   . Smokeless tobacco: Never Used  . Alcohol Use: 0.0 oz/week    0 Standard drinks or equivalent per week     Comment: rare occasion    Review of Systems  Constitutional: Negative for fever and chills.  Gastrointestinal: Negative for nausea and vomiting.  Genitourinary: Positive for testicular pain. Negative for hematuria and scrotal swelling.  Musculoskeletal: Positive for back pain and arthralgias.  All other systems reviewed and are negative.     Allergies  Bee venom and Codeine  Home Medications   Prior to Admission medications   Medication Sig Start Date End Date Taking? Authorizing Provider  acetaminophen (TYLENOL) 500 MG tablet Take 500 mg by mouth every 6 (six) hours as needed for mild pain or moderate pain.   Yes Historical Provider, MD  aspirin 81 MG tablet Take 81 mg by mouth daily.   Yes Historical Provider, MD  atorvastatin (LIPITOR) 80 MG tablet Take 1 tablet (80 mg total) by mouth daily at 6 PM. 01/30/16  Yes Eileen Stanford, PA-C  benazepril  (LOTENSIN) 20 MG tablet Take 20 mg by mouth daily.   Yes Historical Provider, MD  Canagliflozin-Metformin HCl (INVOKAMET) [phone] MG TABS Take 1 tablet by mouth 2 (two) times daily.   Yes Historical Provider, MD  glimepiride (AMARYL) 4 MG tablet Take 4 mg by mouth daily with breakfast.  07/25/12  Yes Historical Provider, MD  isosorbide mononitrate (IMDUR) 30 MG 24 hr tablet Take 1 tablet (30 mg total) by mouth daily. 01/30/16  Yes Eileen Stanford, PA-C  metoprolol succinate (TOPROL-XL) 50 MG 24 hr tablet Take 1 tablet (50 mg total) by mouth daily. Take with or immediately following a meal. 01/30/16  Yes Eileen Stanford, PA-C  pantoprazole (PROTONIX) 40 MG tablet Take 40 mg by mouth daily.   Yes Historical Provider, MD  rivaroxaban (XARELTO) 20 MG TABS tablet Take 1 tablet (20 mg total) by mouth daily with supper. 01/30/16  Yes Eileen Stanford, PA-C  HYDROcodone-acetaminophen (NORCO/VICODIN) 5-325 MG tablet Take 1 tablet by mouth every 4 (four) hours as needed. 02/13/16   Dorie Rank, MD   BP 126/62 mmHg  Pulse 60  Temp(Src) 98.2 F (36.8 C) (Oral)  Resp 16  Ht 5\' 10"  (1.778 m)  Wt 127.007 kg  BMI 40.18 kg/m2  SpO2 96% Physical Exam  Constitutional: He appears well-developed and well-nourished. No distress.  HENT:  Head: Normocephalic and atraumatic.  Right Ear: External ear normal.  Left Ear: External ear normal.  Eyes: Conjunctivae are normal. Right eye exhibits no discharge. Left eye exhibits no discharge. No scleral icterus.  Neck: Neck supple. No tracheal deviation present.  Cardiovascular: Normal rate, regular rhythm and intact distal pulses.   Pulmonary/Chest: Effort normal and breath sounds normal. No stridor. No respiratory distress. He has no wheezes. He has no rales.  Abdominal: Soft. Bowel sounds are normal. He exhibits no distension and no mass. There is tenderness in the right upper quadrant and right lower quadrant. There is no rigidity, no rebound, no guarding and no CVA  tenderness. No hernia.  Musculoskeletal: He exhibits no edema or tenderness.  Neurological: He is alert. He has normal strength. No cranial nerve deficit (no facial droop, extraocular movements intact, no slurred speech) or sensory deficit. He exhibits normal muscle tone. He displays no seizure activity. Coordination normal.  Skin: Skin is warm and dry. No rash noted.  Psychiatric: He has a normal mood and affect.  Nursing note and vitals reviewed.   ED Course  Procedures (including critical care time)  DIAGNOSTIC STUDIES: Oxygen Saturation is 96% on RA, adequate by my interpretation.    COORDINATION OF CARE: 10:26 PM Will administer fluids. Will order CT Renal Stone Study. Will order blood work. Discussed treatment plan with pt at bedside and pt agreed to plan.  Labs Review Labs Reviewed  CBC WITH DIFFERENTIAL/PLATELET -  Abnormal; Notable for the following:    Platelets 149 (*)    All other components within normal limits  URINALYSIS, ROUTINE W REFLEX MICROSCOPIC (NOT AT [facility]) - Abnormal; Notable for the following:    Glucose, UA >1000 (*)    All other components within normal limits  COMPREHENSIVE METABOLIC PANEL - Abnormal; Notable for the following:    Glucose, Bld 173 (*)    Calcium 8.6 (*)    All other components within normal limits  URINE MICROSCOPIC-ADD ON - Abnormal; Notable for the following:    Squamous Epithelial / LPF 0-5 (*)    Bacteria, UA RARE (*)    All other components within normal limits    Imaging Review Ct Renal Stone Study  02/12/2016  CLINICAL DATA:  Sudden onset right groin and testicular pain approximately 2 hours prior to presentation. EXAM: CT ABDOMEN AND PELVIS WITHOUT CONTRAST TECHNIQUE: Multidetector CT imaging of the abdomen and pelvis was performed following the standard protocol without IV contrast. COMPARISON:  05/21/2014 FINDINGS: There is no urinary calculus. There is no hydronephrosis or ureteral dilatation. No periureteral stranding to  suggest recent stone passage. There is cholecystectomy. There are unremarkable unenhanced appearances of the liver, bile ducts, pancreas, spleen and adrenals. The abdominal aorta is normal in caliber. There is mild atherosclerotic calcification. There is no adenopathy in the abdomen or pelvis. There are normal appearances of the stomach, small bowel and colon. The appendix is normal. There is no adenopathy. There is no ascites. There are no acute inflammatory changes in the abdomen or pelvis. There is no significant abnormality in the lower chest. 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 No suspicious bone lesions. IMPRESSION: 1. No acute findings are evident in the abdomen or pelvis. 2. No urinary calculi. 3. 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 Electronically Signed   By: Andreas Newport M.D.   On: 02/12/2016 23:54   I have personally reviewed and evaluated these images and lab results as part of my medical decision-making.    MDM   Final diagnoses:  Lumbar arthropathy ([place])   No acute findings on CT scan.  No ureteral stone.  No AAA.  Labs are reassuring.  Sx may be related to the lumbar disease.  At this time there does not appear to be any evidence of an acute emergency medical condition and the patient appears stable for discharge with appropriate outpatient follow up.   I personally performed the services described in this documentation, which was scribed in my presence.  The recorded information has been reviewed and is accurate.     Dorie Rank, MD 02/13/16 [phone]

## 2016-02-12 NOTE — ED Notes (Signed)
Patient transported to CT 

## 2016-02-12 NOTE — ED Notes (Signed)
Pt states he was getting ready for work and had pain in right hip that radiated around into groin.  Pain has since gotten better

## 2016-02-13 LAB — URINALYSIS, ROUTINE W REFLEX MICROSCOPIC
Bilirubin Urine: NEGATIVE
Glucose, UA: >1000 mg/dL — AB
Hgb urine dipstick: NEGATIVE
Ketones, ur: NEGATIVE mg/dL
Leukocytes, UA: NEGATIVE
Nitrite: NEGATIVE
Protein, ur: NEGATIVE mg/dL
Specific Gravity, Urine: 1.015 (ref 1.005–1.030)
pH: 5 (ref 5.0–8.0)

## 2016-02-13 LAB — URINE MICROSCOPIC-ADD ON

## 2016-02-13 MED ORDER — HYDROCODONE-ACETAMINOPHEN 5-325 MG PO TABS: 1.0000 | ORAL_TABLET | ORAL | Status: DC | PRN

## 2016-02-13 NOTE — ED Notes (Signed)
MD at bedside. 

## 2016-02-13 NOTE — Discharge Instructions (Signed)
Flank Pain °Flank pain refers to pain that is located on the side of the body between the upper abdomen and the back. The pain may occur over a short period of time (acute) or may be long-term or reoccurring (chronic). It may be mild or severe. Flank pain can be caused by many things. °CAUSES  °Some of the more common causes of flank pain include: °· Muscle strains.   °· Muscle spasms.   °· A disease of your spine (vertebral disk disease).   °· A lung infection (pneumonia).   °· Fluid around your lungs (pulmonary edema).   °· A kidney infection.   °· Kidney stones.   °· A very painful skin rash caused by the chickenpox virus (shingles).   °· Gallbladder disease.   °HOME CARE INSTRUCTIONS  °Home care will depend on the cause of your pain. In general, °· Rest as directed by your caregiver. °· Drink enough fluids to keep your urine clear or pale yellow. °· Only take over-the-counter or prescription medicines as directed by your caregiver. Some medicines may help relieve the pain. °· Tell your caregiver about any changes in your pain. °· Follow up with your caregiver as directed. °SEEK IMMEDIATE MEDICAL CARE IF:  °· Your pain is not controlled with medicine.   °· You have new or worsening symptoms. °· Your pain increases.   °· You have abdominal pain.   °· You have shortness of breath.   °· You have persistent nausea or vomiting.   °· You have swelling in your abdomen.   °· You feel faint or pass out.   °· You have blood in your urine. °· You have a fever or persistent symptoms for more than 2-3 days. °· You have a fever and your symptoms suddenly get worse. °MAKE SURE YOU:  °· Understand these instructions. °· Will watch your condition. °· Will get help right away if you are not doing well or get worse. °  °This information is not intended to replace advice given to you by your health care provider. Make sure you discuss any questions you have with your health care provider. °  °Document Released: 12/18/2005 Document  Revised: 07/21/2012 Document Reviewed: 06/10/2012 °Elsevier Interactive Patient Education ©2016 [employer] ° °

## 2016-03-08 ENCOUNTER — Telehealth: Payer: Self-pay | Admitting: Physician Assistant

## 2016-03-08 ENCOUNTER — Emergency Department (HOSPITAL_COMMUNITY): Payer: 59

## 2016-03-08 ENCOUNTER — Encounter (HOSPITAL_COMMUNITY): Payer: Self-pay | Admitting: Emergency Medicine

## 2016-03-08 ENCOUNTER — Emergency Department (HOSPITAL_COMMUNITY)
Admission: EM | Admit: 2016-03-08 | Discharge: 2016-03-08 | Disposition: A | Discharge status: Home / Self Care | Payer: 59 | Attending: Emergency Medicine | Admitting: Emergency Medicine

## 2016-03-08 DIAGNOSIS — Z79899 Other long term (current) drug therapy: Secondary | ICD-10-CM | POA: Insufficient documentation

## 2016-03-08 DIAGNOSIS — Z7984 Long term (current) use of oral hypoglycemic drugs: Secondary | ICD-10-CM | POA: Insufficient documentation

## 2016-03-08 DIAGNOSIS — I10 Essential (primary) hypertension: Secondary | ICD-10-CM | POA: Diagnosis not present

## 2016-03-08 DIAGNOSIS — I2510 Atherosclerotic heart disease of native coronary artery without angina pectoris: Secondary | ICD-10-CM | POA: Insufficient documentation

## 2016-03-08 DIAGNOSIS — Z7982 Long term (current) use of aspirin: Secondary | ICD-10-CM | POA: Insufficient documentation

## 2016-03-08 DIAGNOSIS — E785 Hyperlipidemia, unspecified: Secondary | ICD-10-CM | POA: Diagnosis not present

## 2016-03-08 DIAGNOSIS — I480 Paroxysmal atrial fibrillation: Secondary | ICD-10-CM | POA: Diagnosis not present

## 2016-03-08 DIAGNOSIS — E119 Type 2 diabetes mellitus without complications: Secondary | ICD-10-CM | POA: Diagnosis not present

## 2016-03-08 DIAGNOSIS — R2240 Localized swelling, mass and lump, unspecified lower limb: Secondary | ICD-10-CM | POA: Diagnosis not present

## 2016-03-08 DIAGNOSIS — R000 Tachycardia, unspecified: Secondary | ICD-10-CM | POA: Diagnosis present

## 2016-03-08 LAB — BASIC METABOLIC PANEL
Anion gap: 10 (ref 5–15)
BUN: 15 mg/dL (ref 6–20)
CO2: 24 mmol/L (ref 22–32)
Calcium: 9 mg/dL (ref 8.9–10.3)
Chloride: 104 mmol/L (ref 101–111)
Creatinine, Ser: 0.97 mg/dL (ref 0.61–1.24)
GFR calc Af Amer: >60 mL/min (ref >60)
GFR calc non Af Amer: >60 mL/min (ref >60)
Glucose, Bld: 199 mg/dL — ABNORMAL HIGH (ref 65–99)
Potassium: 4.1 mmol/L (ref 3.5–5.1)
Sodium: 138 mmol/L (ref 135–145)

## 2016-03-08 LAB — CBC
HCT: 46.7 % (ref 39.0–52.0)
Hemoglobin: 16.3 g/dL (ref 13.0–17.0)
MCH: 31.8 pg (ref 26.0–34.0)
MCHC: 34.9 g/dL (ref 30.0–36.0)
MCV: 91 fL (ref 78.0–100.0)
Platelets: 149 10*3/uL — ABNORMAL LOW (ref 150–400)
RBC: 5.13 MIL/uL (ref 4.22–5.81)
RDW: 13.8 % (ref 11.5–15.5)
WBC: 8.3 10*3/uL (ref 4.0–10.5)

## 2016-03-08 LAB — TROPONIN I: Troponin I: <0.03 ng/mL (ref <0.031)

## 2016-03-08 MED ORDER — METOPROLOL SUCCINATE ER 100 MG PO TB24: 100.0000 mg | ORAL_TABLET | Freq: Every day | ORAL | Status: DC

## 2016-03-08 MED ORDER — DILTIAZEM HCL 100 MG IV SOLR
5.0000 mg/h | INTRAVENOUS | Status: DC
  Administered 2016-03-08: 5 mg/h via INTRAVENOUS
  Filled 2016-03-08: qty 100

## 2016-03-08 MED ORDER — BACITRACIN-NEOMYCIN-POLYMYXIN 400-5-5000 EX OINT
TOPICAL_OINTMENT | CUTANEOUS | Status: AC
  Filled 2016-03-08: qty 1

## 2016-03-08 MED ORDER — DILTIAZEM LOAD VIA INFUSION
20.0000 mg | Freq: Once | INTRAVENOUS | Status: AC
  Administered 2016-03-08: 20 mg via INTRAVENOUS
  Filled 2016-03-08: qty 20

## 2016-03-08 NOTE — Telephone Encounter (Signed)
Patient paged cardiology after hour answering service for recurrent afib this morning, with SBP 190 and HR 140s. He think he is back in afib with RVR. He was firsted diagnosed in March 2017, also underwent cath at the time which showed significant apical CAD best treated medically, otherwise mild to moderate CAD. He says he has been compliant with his medication esp Xarelto. TSH was normal on 3/21.  He also says he is already in the ED. Will defer treatment to ED physician. Since he is compliant with his medication esp Xarelto, likely can be cardioverted in the ED without TEE. I would increase his Toprol XL to 100mg  daily on discharge with instruction of taking addition 50mg  if has tachycardia again. We can cut back on benazepril if blood pressure is an issue later.  I will send staff message to our scheduler to arrange early followup.  Hilbert Corrigan PA Pager: [phone]

## 2016-03-08 NOTE — Discharge Instructions (Signed)
Please obtain all of your results from medical records or have your doctors office obtain the results - share them with your doctor - you should be seen at your doctors office in the next 2 days. Call today to arrange your follow up. Take the medications as prescribed. Please review all of the medicines and only take them if you do not have an allergy to them. Please be aware that if you are taking birth control pills, taking other prescriptions, ESPECIALLY ANTIBIOTICS may make the birth control ineffective - if this is the case, either do not engage in sexual activity or use alternative methods of birth control such as condoms until you have finished the medicine and your family doctor says it is OK to restart them. If you are on a blood thinner such as COUMADIN, be aware that any other medicine that you take may cause the coumadin to either work too much, or not enough - you should have your coumadin level rechecked in next 7 days if this is the case.  ?  It is also a possibility that you have an allergic reaction to any of the medicines that you have been prescribed - Everybody reacts differently to medications and while MOST people have no trouble with most medicines, you may have a reaction such as nausea, vomiting, rash, swelling, shortness of breath. If this is the case, please stop taking the medicine immediately and contact your physician.  ?  You should return to the ER if you develop severe or worsening symptoms.     Take an additional 50mg  of Metoprolol if you have palpitations  If the symtpoms don't stop and your heart doesn't slow down or if you have severe symptoms, return to the ER immediately.

## 2016-03-08 NOTE — ED Notes (Signed)
Pt resting quietly. No distress.

## 2016-03-08 NOTE — ED Notes (Signed)
Patient woke up this morning feeling that his heart was racing and some shortness of breath. States h/o afib and on medication. Patient alert. Denies current chest pain,but reports some chest pressure this morning PTA. HR 146 in triage.

## 2016-03-08 NOTE — ED Provider Notes (Signed)
History  By signing my name below, I, Marlowe Kays, attest that this documentation has been prepared under the direction and in the presence of Noemi Chapel, MD. Electronically Signed: Marlowe Kays, ED Scribe. 03/08/2016. 9:47 AM.  Chief Complaint  Patient presents with  . Tachycardia   HPI  HPI Comments:  Lucas [name] is a 60 y.o. obese male with PMHx of paroxysmal atrial fibrillation, DM, HTN, HLD and CAD who presents to the Emergency Department complaining of tachycardia that began this morning upon waking. He reports associated SOB, HTN (taken at home) and mild CP he describes as pressure. He states he was treated for the PAF about one month ago at [facility], after transfer from [facility]. He reports having a heart catheterization at [facility] and was told he had a blockage but did not receive a stent. He has not done anything to treat his current symptoms. He denies modifying factors. He denies HA, fever, chills, nausea, vomiting, diarrhea, abdominal pain.   According to medical records the patient had a spontaneous conversion back into normal sinus rhythm during his last hospitalization with medications, he did not require cardioversion.  Past Medical History  Diagnosis Date  . Cataract   . Diabetes mellitus   . Hypertension   . Hyperlipidemia   . Sleep apnea     lost 40 lbs & no longer uses CPAP  . Arthritis   . PAF (paroxysmal atrial fibrillation) ([place]) 01/2016  . CAD (coronary artery disease)     a. LHC on 01/29/16 which revealed significant apical LAD stenosis, best treated medically. There was moderate disease in the D2 and mid nondominant RCA.[name] No PCI performed    Past Surgical History  Procedure Laterality Date  . Colonoscopy    . Polypectomy    . Tonsillectomy    . Nasal sinus surgery    . Ankle surgery Left 2012    tendon repair  . Left knee sugery Left     Arthroscopy  . Cystoscopy w/ ureteral stent placement      right  . Cataract extraction  w/phaco  11/11/2012    Procedure: CATARACT EXTRACTION PHACO AND INTRAOCULAR LENS PLACEMENT (IOC);  Surgeon: [name], MD;  Location: [facility];  Service: Ophthalmology;  Laterality: Right;  CDE:  5.56  . Cataract extraction w/phaco Left 12/15/2013    Procedure: CATARACT EXTRACTION PHACO AND INTRAOCULAR LENS PLACEMENT (IOC);  Surgeon: [name], MD;  Location: [facility];  Service: Ophthalmology;  Laterality: Left;  CDE:13.36  . Cholecystectomy    . Cardiac catheterization [name] 01/29/2016    Procedure: Left Heart Cath and Coronary Angiography;  Surgeon: [name], MD;  Location: [place] [facility];  Service: Cardiovascular;  Laterality: [name];   Family History  Problem Relation Age of Onset  . Colon cancer Neg Hx   . Rectal cancer Neg Hx   . Stomach cancer Neg Hx    Social History  Substance Use Topics  . Smoking status: Never Smoker   . Smokeless tobacco: Never Used  . Alcohol Use: 0.0 oz/week    0 Standard drinks or equivalent per week     Comment: rare occasion    Review of Systems  Respiratory: Positive for shortness of breath.   Cardiovascular: Positive for chest pain (mild, pressure), palpitations and leg swelling.  All other systems reviewed and are negative.   Allergies  Bee venom and Codeine  Home Medications   Prior to Admission medications   Medication Sig Start Date End  Date Taking? Authorizing Provider  acetaminophen (TYLENOL) 500 MG tablet Take 500 mg by mouth every 6 (six) hours as needed for mild pain or moderate pain.   Yes Historical Provider, MD  aspirin 81 MG tablet Take 81 mg by mouth daily.   Yes Historical Provider, MD  atorvastatin (LIPITOR) 80 MG tablet Take 1 tablet (80 mg total) by mouth daily at 6 PM. 01/30/16  Yes Eileen Stanford, PA-C  benazepril (LOTENSIN) 20 MG tablet Take 20 mg by mouth daily.   Yes Historical Provider, MD  Canagliflozin-Metformin HCl (INVOKAMET) [phone] MG TABS Take 1 tablet by mouth 2 (two) times daily.   Yes Historical Provider,  MD  glimepiride (AMARYL) 4 MG tablet Take 4 mg by mouth daily with breakfast.  07/25/12  Yes Historical Provider, MD  HYDROcodone-acetaminophen (NORCO/VICODIN) 5-325 MG tablet Take 1 tablet by mouth every 4 (four) hours as needed. 02/13/16  Yes Dorie Rank, MD  isosorbide mononitrate (IMDUR) 30 MG 24 hr tablet Take 1 tablet (30 mg total) by mouth daily. 01/30/16  Yes Eileen Stanford, PA-C  pantoprazole (PROTONIX) 40 MG tablet Take 40 mg by mouth daily.   Yes Historical Provider, MD  rivaroxaban (XARELTO) 20 MG TABS tablet Take 1 tablet (20 mg total) by mouth daily with supper. 01/30/16  Yes Eileen Stanford, PA-C  metoprolol succinate (TOPROL-XL) 100 MG 24 hr tablet Take 1 tablet (100 mg total) by mouth daily. Take with or immediately following a meal. 03/08/16   Noemi Chapel, MD   Triage Vitals: BP 135/90 mmHg  Pulse 145  Temp(Src) 98.7 F (37.1 C) (Oral)  Resp 17  Ht 5\' 10"  (1.778 m)  Wt 280 lb (127.007 kg)  BMI 40.18 kg/m2  SpO2 97% Physical Exam  Constitutional: He appears well-developed and well-nourished. No distress.  HENT:  Head: Normocephalic and atraumatic.  Mouth/Throat: Oropharynx is clear and moist. No oropharyngeal exudate.  Eyes: Conjunctivae and EOM are normal. Pupils are equal, round, and reactive to light. Right eye exhibits no discharge. Left eye exhibits no discharge. No scleral icterus.  Neck: Normal range of motion. Neck supple. No JVD present. No thyromegaly present.  Cardiovascular: Normal heart sounds and intact distal pulses.  Tachycardia present.  Exam reveals no gallop and no friction rub.   No murmur heard. Rapid atrial fibrillation. 1+ symmetrical edema bilaterally below knees.  Pulmonary/Chest: Effort normal and breath sounds normal. No respiratory distress. He has no wheezes. He has no rales.  Abdominal: Soft. Bowel sounds are normal. He exhibits no distension and no mass. There is no tenderness.  Musculoskeletal: Normal range of motion. He exhibits edema. He  exhibits no tenderness.  Lymphadenopathy:    He has no cervical adenopathy.  Neurological: He is alert. Coordination normal.  Skin: Skin is warm and dry. No rash noted. No erythema.  Psychiatric: He has a normal mood and affect. His behavior is normal.  Nursing note and vitals reviewed.   ED Course  Procedures (including critical care time) DIAGNOSTIC STUDIES: Oxygen Saturation is 97% on RA, normal by my interpretation.   COORDINATION OF CARE: 8:53 AM- Will order CXR and labs. Pt verbalizes understanding and agrees to plan.  Medications  diltiazem (CARDIZEM) 1 mg/mL load via infusion 20 mg (20 mg Intravenous Given 03/08/16 0922)    And  diltiazem (CARDIZEM) 100 mg in dextrose 5 % 100 mL (1 mg/mL) infusion (0 mg/hr Intravenous Stopped 03/08/16 1038)    Labs Review Labs Reviewed  BASIC METABOLIC PANEL - Abnormal; Notable for  the following:    Glucose, Bld 199 (*)    All other components within normal limits  CBC - Abnormal; Notable for the following:    Platelets 149 (*)    All other components within normal limits  TROPONIN I    Imaging Review Dg Chest 2 View  03/08/2016  CLINICAL DATA:  Tachycardia EXAM: CHEST  2 VIEW COMPARISON:  05/21/2014 FINDINGS: The heart size and vascular pattern normal. Right lung is clear. Areas of discoid atelectasis or scarring left midlung zone stable. Dense circumscribed 5 mm pulmonary nodule lateral lingula stable. IMPRESSION: No acute findings Electronically Signed   By: Skipper Cliche M.D.   On: 03/08/2016 09:27   I have personally reviewed and evaluated these images and lab results as part of my medical decision-making.   EKG Interpretation   Date/Time:  Saturday March 08 2016 08:36:09 EDT Ventricular Rate:  148 PR Interval:    QRS Duration: 158 QT Interval:  321 QTC Calculation: 504 R Axis:   13 Text Interpretation:  Atrial flutter with varied AV block, Right bundle  branch block Since last tracing Atrial fibrillation has retured  with RVR  Abnormal ekg Confirmed by Sabra Heck  MD, Tyson Parkison ([id number]) on 03/08/2016 9:06:26  AM      EKG Interpretation  Date/Time:  Saturday March 08 2016 10:33:13 EDT Ventricular Rate:  69 PR Interval:  132 QRS Duration: 114 QT Interval:  397 QTC Calculation: 425 R Axis:   35 Text Interpretation:  Sinus rhythm Incomplete left bundle branch block Low voltage, precordial leads Since last tracing afib has resolved Confirmed by Britzy Graul  MD, Shivali Quackenbush ([id number]) on 03/08/2016 10:38:37 AM         MDM   Final diagnoses:  Paroxysmal atrial fibrillation ([place])   I personally performed the services described in this documentation, which was scribed in my presence. The recorded information has been reviewed and is accurate.   The patient does have atrial fibrillation and a rapid ventricular rate, we'll attempt medical and then if not successful electric cardioversion, this is the recommendations of the cardiology service as well. If he fails we will admit for ongoing rate control. He is taking his Xarelto religiously.  D/w Dr. Wynonia Lawman who recommends synchronized cardioversion however the pt convered on cardizem prior to this procedure - he is sx free with normal VS and feeling well - will stop drip and pat can be d/c home.  He understands the indications for return - Rx for increased dose of metoprolol given at Cardiology recommendations.  Meds given in ED:  Medications  diltiazem (CARDIZEM) 1 mg/mL load via infusion 20 mg (20 mg Intravenous Given 03/08/16 0922)    And  diltiazem (CARDIZEM) 100 mg in dextrose 5 % 100 mL (1 mg/mL) infusion (0 mg/hr Intravenous Stopped 03/08/16 1038)    New Prescriptions   No medications on file      Noemi Chapel, MD 03/08/16 1039

## 2016-03-11 ENCOUNTER — Telehealth: Payer: Self-pay | Admitting: Cardiology

## 2016-03-11 NOTE — Telephone Encounter (Signed)
Patient returning call.

## 2016-03-11 NOTE — Telephone Encounter (Signed)
Lucas [name] called. Appointment given to him.

## 2016-03-27 ENCOUNTER — Encounter: Payer: Self-pay | Admitting: Cardiology

## 2016-03-27 ENCOUNTER — Ambulatory Visit (INDEPENDENT_AMBULATORY_CARE_PROVIDER_SITE_OTHER): Payer: 59 | Admitting: Cardiology

## 2016-03-27 VITALS — BP 113/68 | HR 55 | Ht 70.0 in | Wt 283.8 lb

## 2016-03-27 DIAGNOSIS — I480 Paroxysmal atrial fibrillation: Secondary | ICD-10-CM | POA: Diagnosis not present

## 2016-03-27 DIAGNOSIS — I10 Essential (primary) hypertension: Secondary | ICD-10-CM | POA: Diagnosis not present

## 2016-03-27 DIAGNOSIS — I2510 Atherosclerotic heart disease of native coronary artery without angina pectoris: Secondary | ICD-10-CM | POA: Diagnosis not present

## 2016-03-27 MED ORDER — DILTIAZEM HCL 30 MG PO TABS
ORAL_TABLET | ORAL | Status: DC
Start: 2016-03-27 — End: 2020-04-12

## 2016-03-27 NOTE — Patient Instructions (Signed)
Your physician wants you to follow-up in: [place] DR. BRANCH You will receive a reminder letter in the mail two months in advance. If you don't receive a letter, please call our office to schedule the follow-up appointment.  Your physician has recommended you make the following change in your medication:   START DILTIAZEM 30 MG TWICE DAILY AS NEEDED FOR PALPITATIONS   Thank you for choosing [facility]!!

## 2016-03-27 NOTE — Progress Notes (Signed)
Patient ID: Lucas [name], male   DOB: 20-Jul-1956, 60 y.o.   MRN: [id number]     Clinical Summary Lucas [name] is a 60 y.o.male seen today for hospital follow up, this is our first visit together.  1. Afib - admit 01/2016 with palpitations and chest pain. Found to be in afib with RVR, first known episode - converted on his own back to NSR.  - CHADS2Vasc score of 2, started on xarelto. Started on Toprol XL for rate control  - seen in ER 03/08/16 with palpitations. Patient in aflutter with RVR, self converted in ER. Home metoprolol was increased.  - no recent palpitations since ER visit. No bleeding troubles. No lightheadness or dizziness.   2. CAD - previous chest pain episode occurred in the setting of afib with RVR - cath 01/29/16 with apical LAD disease, treated medically. Otherwise diffuse mild to moderate disease -no recent chest pain  3. HTN - compliant with meds  4. OSA - noncompliant with CPAP - has repeat sleep study pending.  Past Medical History  Diagnosis Date  . Cataract   . Diabetes mellitus   . Hypertension   . Hyperlipidemia   . Sleep apnea     lost 40 lbs & no longer uses CPAP  . Arthritis   . PAF (paroxysmal atrial fibrillation) ([place]) 01/2016  . CAD (coronary artery disease)     a. LHC on 01/29/16 which revealed significant apical LAD stenosis, best treated medically. There was moderate disease in the D2 and mid nondominant RCA.[name] No PCI performed      Allergies  Allergen Reactions  . Bee Venom Swelling  . Codeine Nausea Only     Current Outpatient Prescriptions  Medication Sig Dispense Refill  . acetaminophen (TYLENOL) 500 MG tablet Take 500 mg by mouth every 6 (six) hours as needed for mild pain or moderate pain.    [name] aspirin 81 MG tablet Take 81 mg by mouth daily.    [name] atorvastatin (LIPITOR) 80 MG tablet Take 1 tablet (80 mg total) by mouth daily at 6 PM. 90 tablet 3  . benazepril (LOTENSIN) 20 MG tablet Take 20 mg by mouth daily.    .  Canagliflozin-Metformin HCl (INVOKAMET) [phone] MG TABS Take 1 tablet by mouth 2 (two) times daily.    [name] glimepiride (AMARYL) 4 MG tablet Take 4 mg by mouth daily with breakfast.     . HYDROcodone-acetaminophen (NORCO/VICODIN) 5-325 MG tablet Take 1 tablet by mouth every 4 (four) hours as needed. 16 tablet 0  . isosorbide mononitrate (IMDUR) 30 MG 24 hr tablet Take 1 tablet (30 mg total) by mouth daily. 90 tablet 3  . metoprolol succinate (TOPROL-XL) 100 MG 24 hr tablet Take 1 tablet (100 mg total) by mouth daily. Take with or immediately following a meal. 60 tablet 0  . pantoprazole (PROTONIX) 40 MG tablet Take 40 mg by mouth daily.    . rivaroxaban (XARELTO) 20 MG TABS tablet Take 1 tablet (20 mg total) by mouth daily with supper. 30 tablet 11   No current facility-administered medications for this visit.     Past Surgical History  Procedure Laterality Date  . Colonoscopy    . Polypectomy    . Tonsillectomy    . Nasal sinus surgery    . Ankle surgery Left 2012    tendon repair  . Left knee sugery Left     Arthroscopy  . Cystoscopy w/ ureteral stent placement      right  .  Cataract extraction w/phaco  11/11/2012    Procedure: CATARACT EXTRACTION PHACO AND INTRAOCULAR LENS PLACEMENT (IOC);  Surgeon: Tonny Rosealyn Little, MD;  Location: [facility];  Service: Ophthalmology;  Laterality: Right;  CDE:  5.56  . Cataract extraction w/phaco Left 12/15/2013    Procedure: CATARACT EXTRACTION PHACO AND INTRAOCULAR LENS PLACEMENT (IOC);  Surgeon: Tonny Areal Cochrane, MD;  Location: [facility];  Service: Ophthalmology;  Laterality: Left;  CDE:13.36  . Cholecystectomy    . Cardiac catheterization [name] 01/29/2016    Procedure: Left Heart Cath and Coronary Angiography;  Surgeon: [name], MD;  Location: [place] [facility];  Service: Cardiovascular;  Laterality: [name];     Allergies  Allergen Reactions  . Bee Venom Swelling  . Codeine Nausea Only      Family History  Problem Relation Age of Onset  . Colon cancer Neg  Hx   . Rectal cancer Neg Hx   . Stomach cancer Neg Hx      Social History Lucas [name] reports that he has never smoked. He has never used smokeless tobacco. Lucas [name] reports that he drinks alcohol.   Review of Systems CONSTITUTIONAL: No weight loss, fever, chills, weakness or fatigue.  HEENT: Eyes: No visual loss, blurred vision, double vision or yellow sclerae.No hearing loss, sneezing, congestion, runny nose or sore throat.  SKIN: No rash or itching.  CARDIOVASCULAR: per HPI RESPIRATORY: No shortness of breath, cough or sputum.  GASTROINTESTINAL: No anorexia, nausea, vomiting or diarrhea. No abdominal pain or blood.  GENITOURINARY: No burning on urination, no polyuria NEUROLOGICAL: No headache, dizziness, syncope, paralysis, ataxia, numbness or tingling in the extremities. No change in bowel or bladder control.  MUSCULOSKELETAL: No muscle, back pain, joint pain or stiffness.  LYMPHATICS: No enlarged nodes. No history of splenectomy.  PSYCHIATRIC: No history of depression or anxiety.  ENDOCRINOLOGIC: No reports of sweating, cold or heat intolerance. No polyuria or polydipsia.  [name]   Physical Examination Filed Vitals:   03/27/16 1307  BP: 113/68  Pulse: 55   Filed Vitals:   03/27/16 1307  Height: 5\' 10"  (1.778 m)  Weight: 283 lb 12.8 oz (128.731 kg)    Gen: resting comfortably, no acute distress HEENT: no scleral icterus, pupils equal round and reactive, no palptable cervical adenopathy,  CV: RRR, no m/r/g, no jvd Resp: Clear to auscultation bilaterally GI: abdomen is soft, non-tender, non-distended, normal bowel sounds, no hepatosplenomegaly MSK: extremities are warm, no edema.  Skin: warm, no rash Neuro:  no focal deficits Psych: appropriate affect   Diagnostic Studies  01/2016 echo Study Conclusions  - Left ventricle: The cavity size was mildly dilated. Wall  thickness was increased in a pattern of mild LVH. Systolic  function was normal. The estimated  ejection fraction was in the  range of 55% to 60%.  01/2016 cath 1. Dist LAD lesion, 80% stenosed. 2. Mid RCA lesion, 60% stenosed. 3. Ost 2nd Diag to 2nd Diag lesion, 60% stenosed. 4. 2nd Mrg lesion, 30% stenosed. 5. Mid LAD lesion, 25% stenosed. 6. LPDA lesion, 45% stenosed.   Significant apical LAD stenosis, best treated with medication. Moderate disease in the second diagonal and mid nondominant RCA.  Normal left ventricular systolic function. Normal left ventricular filling pressures.  Post Cath Recommendations:   Medical therapy of both atrial fibrillation and coronary artery disease. Percutaneous intervention is not currently indicated for coronary disease.   Assessment and Plan   1. PAF - episode of afib with RVR requiring ER visit, metoprolol was increased at  that time. No recurrent symptoms - we will give Rx for prn diltiazem  - CHADS2Vasc score is 2, continue xarelto.   2. CAD - cath with distal apical disease, otherwise moderate disease - no recent symptoms - continue current meds  3. HTN - at goal, we will continue current meds   4. OSA - has repeat sleep study pending.    F/u 6 months     Arnoldo Lenis, M.D.

## 2016-03-28 ENCOUNTER — Other Ambulatory Visit (HOSPITAL_COMMUNITY): Payer: Self-pay | Admitting: Respiratory Therapy

## 2016-03-28 DIAGNOSIS — G4730 Sleep apnea, unspecified: Secondary | ICD-10-CM

## 2016-04-10 ENCOUNTER — Ambulatory Visit: Payer: 59 | Attending: Pulmonary Disease | Admitting: Neurology

## 2016-04-10 VITALS — Ht 70.0 in | Wt 283.0 lb

## 2016-04-10 DIAGNOSIS — G4730 Sleep apnea, unspecified: Secondary | ICD-10-CM

## 2016-04-10 DIAGNOSIS — G4733 Obstructive sleep apnea (adult) (pediatric): Secondary | ICD-10-CM | POA: Diagnosis not present

## 2016-04-20 NOTE — Procedures (Signed)
[place] A. Merlene Laughter, MD     [email or web address]             NOCTURNAL POLYSOMNOGRAPHY   LOCATION: [facility]   Patient Name: Lucas [name], Lucas [name] Date: 04/10/2016 Gender: Male D.O.B: 07/06/1956 Age (years): 67 Referring Provider: Not Available Height (inches): 70 Interpreting Physician: Phillips Odor MD, ABSM Weight (lbs): 283 RPSGT: Peak, Robert BMI: 41 MRN: [id number] Neck Size: 19.50 CLINICAL INFORMATION Sleep Study Type: NPSG Indication for sleep study: [name] Epworth Sleepiness Score: 15 SLEEP STUDY TECHNIQUE As per the AASM Manual for the Scoring of Sleep and Associated Events v2.3 (April 2016) with a hypopnea requiring 4% desaturations. The channels recorded and monitored were frontal, central and occipital EEG, electrooculogram (EOG), submentalis EMG (chin), nasal and oral airflow, thoracic and abdominal wall motion, anterior tibialis EMG, snore microphone, electrocardiogram, and pulse oximetry. MEDICATIONS Patient's medications include: [name]. Medications self-administered by patient during sleep study : No sleep medicine administered.  Current outpatient prescriptions:  .  acetaminophen (TYLENOL) 500 MG tablet, Take 500 mg by mouth every 6 (six) hours as needed for mild pain or moderate pain., Disp: , Rfl:  .  aspirin 81 MG tablet, Take 81 mg by mouth daily., Disp: , Rfl:  .  atorvastatin (LIPITOR) 80 MG tablet, Take 1 tablet (80 mg total) by mouth daily at 6 PM., Disp: 90 tablet, Rfl: 3 .  benazepril (LOTENSIN) 20 MG tablet, Take 20 mg by mouth daily., Disp: , Rfl:  .  Canagliflozin-Metformin HCl (INVOKAMET) [phone] MG TABS, Take 1 tablet by mouth 2 (two) times daily., Disp: , Rfl:  .  diltiazem (CARDIZEM) 30 MG tablet, Take 1 tab 2 times daily as needed for palpitations, Disp: 180 tablet, Rfl: 3 .  glimepiride (AMARYL) 4 MG tablet, Take 4 mg by mouth daily with breakfast. , Disp: , Rfl:  .  HYDROcodone-acetaminophen (NORCO/VICODIN) 5-325 MG tablet,  Take 1 tablet by mouth every 4 (four) hours as needed., Disp: 16 tablet, Rfl: 0 .  isosorbide mononitrate (IMDUR) 30 MG 24 hr tablet, Take 1 tablet (30 mg total) by mouth daily., Disp: 90 tablet, Rfl: 3 .  metoprolol succinate (TOPROL-XL) 100 MG 24 hr tablet, Take 1 tablet (100 mg total) by mouth daily. Take with or immediately following a meal., Disp: 60 tablet, Rfl: 0 .  pantoprazole (PROTONIX) 40 MG tablet, Take 40 mg by mouth daily., Disp: , Rfl:  .  rivaroxaban (XARELTO) 20 MG TABS tablet, Take 1 tablet (20 mg total) by mouth daily with supper., Disp: 30 tablet, Rfl: 11  SLEEP ARCHITECTURE The study was initiated at 10:22:23 PM and ended at 5:00:57 AM. Sleep onset time was 1.5 minutes and the sleep efficiency was 92.3%. The total sleep time was 368.0 minutes. Stage REM latency was 75.0 minutes. The patient spent 3.12% of the night in stage N1 sleep, 73.10% in stage N2 sleep, 7.20% in stage N3 and 16.58% in REM. Alpha intrusion was absent. Supine sleep was 100.00%. RESPIRATORY PARAMETERS The overall apnea/hypopnea index (AHI) was 10.9 per hour. There were 6 total apneas, including 4 obstructive, 2 central and 0 mixed apneas. There were 61 hypopneas and 14 RERAs. The AHI during Stage REM sleep was 48.2 per hour. AHI while supine was 10.9 per hour. The mean oxygen saturation was 93.74%. The minimum SpO2 during sleep was 75.00%. Moderate snoring was noted during this study. CARDIAC DATA The 2 lead EKG demonstrated sinus rhythm. The mean heart rate was 56.93 beats per minute. Other EKG findings include: None. LEG  MOVEMENT DATA The total PLMS were 0 with a resulting PLMS index of 0.00. Associated arousal with leg movement index was 0.0.   IMPRESSIONS - Mild mostly REM related obstructive sleep apnea. Suggest trial of AutoPAP 8-15.   Delano Metz, MD Diplomate, American Board of Sleep Medicine.

## 2016-05-08 ENCOUNTER — Ambulatory Visit: Payer: 59 | Admitting: Cardiology

## 2016-05-16 ENCOUNTER — Other Ambulatory Visit: Payer: Self-pay | Admitting: Cardiology

## 2016-05-29 ENCOUNTER — Emergency Department (HOSPITAL_COMMUNITY): Payer: 59

## 2016-05-29 ENCOUNTER — Observation Stay (HOSPITAL_COMMUNITY)
Admission: EM | Admit: 2016-05-29 | Discharge: 2016-06-03 | Disposition: A | Discharge status: Home / Self Care | Payer: 59 | Attending: Cardiovascular Disease | Admitting: Cardiovascular Disease

## 2016-05-29 ENCOUNTER — Encounter (HOSPITAL_COMMUNITY): Payer: Self-pay | Admitting: Emergency Medicine

## 2016-05-29 DIAGNOSIS — I4892 Unspecified atrial flutter: Secondary | ICD-10-CM

## 2016-05-29 DIAGNOSIS — E119 Type 2 diabetes mellitus without complications: Secondary | ICD-10-CM | POA: Insufficient documentation

## 2016-05-29 DIAGNOSIS — D72829 Elevated white blood cell count, unspecified: Secondary | ICD-10-CM | POA: Diagnosis not present

## 2016-05-29 DIAGNOSIS — I480 Paroxysmal atrial fibrillation: Secondary | ICD-10-CM | POA: Diagnosis not present

## 2016-05-29 DIAGNOSIS — I25119 Atherosclerotic heart disease of native coronary artery with unspecified angina pectoris: Secondary | ICD-10-CM | POA: Diagnosis present

## 2016-05-29 DIAGNOSIS — N179 Acute kidney failure, unspecified: Secondary | ICD-10-CM | POA: Diagnosis not present

## 2016-05-29 DIAGNOSIS — Z7901 Long term (current) use of anticoagulants: Secondary | ICD-10-CM | POA: Insufficient documentation

## 2016-05-29 DIAGNOSIS — I4891 Unspecified atrial fibrillation: Secondary | ICD-10-CM | POA: Diagnosis present

## 2016-05-29 DIAGNOSIS — G4733 Obstructive sleep apnea (adult) (pediatric): Secondary | ICD-10-CM | POA: Diagnosis not present

## 2016-05-29 DIAGNOSIS — Z7982 Long term (current) use of aspirin: Secondary | ICD-10-CM | POA: Diagnosis not present

## 2016-05-29 DIAGNOSIS — Z961 Presence of intraocular lens: Secondary | ICD-10-CM | POA: Insufficient documentation

## 2016-05-29 DIAGNOSIS — Z9841 Cataract extraction status, right eye: Secondary | ICD-10-CM | POA: Insufficient documentation

## 2016-05-29 DIAGNOSIS — Z9842 Cataract extraction status, left eye: Secondary | ICD-10-CM | POA: Diagnosis not present

## 2016-05-29 DIAGNOSIS — Z6839 Body mass index (BMI) 39.0-39.9, adult: Secondary | ICD-10-CM | POA: Insufficient documentation

## 2016-05-29 DIAGNOSIS — E669 Obesity, unspecified: Secondary | ICD-10-CM | POA: Insufficient documentation

## 2016-05-29 DIAGNOSIS — I2510 Atherosclerotic heart disease of native coronary artery without angina pectoris: Secondary | ICD-10-CM | POA: Diagnosis not present

## 2016-05-29 DIAGNOSIS — I483 Typical atrial flutter: Secondary | ICD-10-CM | POA: Diagnosis not present

## 2016-05-29 DIAGNOSIS — R079 Chest pain, unspecified: Secondary | ICD-10-CM | POA: Diagnosis present

## 2016-05-29 DIAGNOSIS — I10 Essential (primary) hypertension: Secondary | ICD-10-CM | POA: Diagnosis present

## 2016-05-29 DIAGNOSIS — Z79899 Other long term (current) drug therapy: Secondary | ICD-10-CM | POA: Insufficient documentation

## 2016-05-29 DIAGNOSIS — Z7984 Long term (current) use of oral hypoglycemic drugs: Secondary | ICD-10-CM | POA: Diagnosis not present

## 2016-05-29 DIAGNOSIS — E785 Hyperlipidemia, unspecified: Secondary | ICD-10-CM | POA: Insufficient documentation

## 2016-05-29 DIAGNOSIS — G4730 Sleep apnea, unspecified: Secondary | ICD-10-CM | POA: Diagnosis present

## 2016-05-29 HISTORY — DX: Obesity, unspecified: E66.9

## 2016-05-29 LAB — BASIC METABOLIC PANEL
Anion gap: 10 (ref 5–15)
BUN: 17 mg/dL (ref 6–20)
CO2: 20 mmol/L — ABNORMAL LOW (ref 22–32)
Calcium: 9.5 mg/dL (ref 8.9–10.3)
Chloride: 107 mmol/L (ref 101–111)
Creatinine, Ser: 1.34 mg/dL — ABNORMAL HIGH (ref 0.61–1.24)
GFR calc Af Amer: >60 mL/min (ref >60)
GFR calc non Af Amer: 56 mL/min — ABNORMAL LOW (ref >60)
Glucose, Bld: 239 mg/dL — ABNORMAL HIGH (ref 65–99)
Potassium: 4.5 mmol/L (ref 3.5–5.1)
Sodium: 137 mmol/L (ref 135–145)

## 2016-05-29 LAB — URINALYSIS, ROUTINE W REFLEX MICROSCOPIC
Bilirubin Urine: NEGATIVE
Glucose, UA: >1000 mg/dL — AB
Hgb urine dipstick: NEGATIVE
Ketones, ur: 15 mg/dL — AB
Leukocytes, UA: NEGATIVE
Nitrite: NEGATIVE
Protein, ur: NEGATIVE mg/dL
Specific Gravity, Urine: 1.029 (ref 1.005–1.030)
pH: 5.5 (ref 5.0–8.0)

## 2016-05-29 LAB — CBC
HCT: 47 % (ref 39.0–52.0)
Hemoglobin: 16.5 g/dL (ref 13.0–17.0)
MCH: 31.9 pg (ref 26.0–34.0)
MCHC: 35.1 g/dL (ref 30.0–36.0)
MCV: 90.9 fL (ref 78.0–100.0)
Platelets: 176 10*3/uL (ref 150–400)
RBC: 5.17 MIL/uL (ref 4.22–5.81)
RDW: 13.9 % (ref 11.5–15.5)
WBC: 13.9 10*3/uL — ABNORMAL HIGH (ref 4.0–10.5)

## 2016-05-29 LAB — TSH: TSH: 1.832 u[IU]/mL (ref 0.350–4.500)

## 2016-05-29 LAB — URINE MICROSCOPIC-ADD ON: RBC / HPF: NONE SEEN RBC/hpf (ref 0–5)

## 2016-05-29 LAB — I-STAT TROPONIN, ED: Troponin i, poc: 0 ng/mL (ref 0.00–0.08)

## 2016-05-29 LAB — MAGNESIUM: Magnesium: 2 mg/dL (ref 1.7–2.4)

## 2016-05-29 MED ORDER — ONDANSETRON HCL 4 MG/2ML IJ SOLN: 4.0000 mg | Freq: Four times a day (QID) | INTRAMUSCULAR | Status: DC | PRN

## 2016-05-29 MED ORDER — AMIODARONE HCL IN DEXTROSE 360-4.14 MG/200ML-% IV SOLN
60.0000 mg/h | INTRAVENOUS | Status: DC
  Administered 2016-05-29 – 2016-05-30 (×2): 60 mg/h via INTRAVENOUS
  Filled 2016-05-29 (×2): qty 200

## 2016-05-29 MED ORDER — RIVAROXABAN 20 MG PO TABS: 20.0000 mg | ORAL_TABLET | Freq: Every day | ORAL | Status: DC

## 2016-05-29 MED ORDER — METOPROLOL TARTRATE 5 MG/5ML IV SOLN: 5.0000 mg | Freq: Once | INTRAVENOUS | Status: DC

## 2016-05-29 MED ORDER — ASPIRIN EC 81 MG PO TBEC
81.0000 mg | DELAYED_RELEASE_TABLET | Freq: Every day | ORAL | Status: DC
  Administered 2016-05-30: 81 mg via ORAL
  Filled 2016-05-29: qty 1

## 2016-05-29 MED ORDER — METOPROLOL TARTRATE 5 MG/5ML IV SOLN
5.0000 mg | Freq: Once | INTRAVENOUS | Status: DC
  Filled 2016-05-29: qty 5

## 2016-05-29 MED ORDER — PANTOPRAZOLE SODIUM 40 MG PO TBEC
40.0000 mg | DELAYED_RELEASE_TABLET | Freq: Every day | ORAL | Status: DC
  Administered 2016-05-30 – 2016-06-03 (×4): 40 mg via ORAL
  Filled 2016-05-29 (×4): qty 1

## 2016-05-29 MED ORDER — DILTIAZEM HCL 30 MG PO TABS
30.0000 mg | ORAL_TABLET | Freq: Three times a day (TID) | ORAL | Status: DC
  Administered 2016-05-30 (×2): 30 mg via ORAL
  Filled 2016-05-29 (×2): qty 1

## 2016-05-29 MED ORDER — METFORMIN HCL 500 MG PO TABS
1000.0000 mg | ORAL_TABLET | Freq: Two times a day (BID) | ORAL | Status: DC
  Administered 2016-05-30 – 2016-06-03 (×8): 1000 mg via ORAL
  Filled 2016-05-29 (×8): qty 2

## 2016-05-29 MED ORDER — METOPROLOL SUCCINATE ER 100 MG PO TB24
100.0000 mg | ORAL_TABLET | Freq: Every day | ORAL | Status: DC
  Administered 2016-05-30 – 2016-06-03 (×4): 100 mg via ORAL
  Filled 2016-05-29 (×4): qty 1

## 2016-05-29 MED ORDER — ISOSORBIDE MONONITRATE ER 30 MG PO TB24
30.0000 mg | ORAL_TABLET | Freq: Every day | ORAL | Status: DC
Start: 2016-05-30 — End: 2016-06-03
  Administered 2016-05-30 – 2016-06-03 (×5): 30 mg via ORAL
  Filled 2016-05-29 (×5): qty 1

## 2016-05-29 MED ORDER — GLIMEPIRIDE 4 MG PO TABS
4.0000 mg | ORAL_TABLET | Freq: Every day | ORAL | Status: DC
  Administered 2016-05-30 – 2016-06-03 (×4): 4 mg via ORAL
  Filled 2016-05-29 (×4): qty 1

## 2016-05-29 MED ORDER — AMIODARONE HCL IN DEXTROSE 360-4.14 MG/200ML-% IV SOLN
30.0000 mg/h | INTRAVENOUS | Status: DC
  Administered 2016-05-30: 30 mg/h via INTRAVENOUS

## 2016-05-29 MED ORDER — ATORVASTATIN CALCIUM 80 MG PO TABS
80.0000 mg | ORAL_TABLET | Freq: Every day | ORAL | Status: DC
  Administered 2016-05-30 – 2016-06-02 (×5): 80 mg via ORAL
  Filled 2016-05-29 (×5): qty 1

## 2016-05-29 MED ORDER — METOPROLOL TARTRATE 5 MG/5ML IV SOLN
5.0000 mg | Freq: Once | INTRAVENOUS | Status: AC
  Administered 2016-05-29: 5 mg via INTRAVENOUS

## 2016-05-29 MED ORDER — ACETAMINOPHEN 325 MG PO TABS
650.0000 mg | ORAL_TABLET | ORAL | Status: DC | PRN
  Filled 2016-05-29: qty 2

## 2016-05-29 MED ORDER — GLIMEPIRIDE 4 MG PO TABS
2.0000 mg | ORAL_TABLET | Freq: Every day | ORAL | Status: DC
  Administered 2016-05-30 – 2016-06-02 (×4): 2 mg via ORAL
  Filled 2016-05-29 (×4): qty 1

## 2016-05-29 MED ORDER — METOPROLOL TARTRATE 5 MG/5ML IV SOLN
INTRAVENOUS | Status: AC
  Administered 2016-05-29: 5 mg
  Filled 2016-05-29: qty 5

## 2016-05-29 MED ORDER — GLIMEPIRIDE 4 MG PO TABS: 2.0000 mg | ORAL_TABLET | Freq: Two times a day (BID) | ORAL | Status: DC

## 2016-05-29 MED ORDER — RIVAROXABAN 20 MG PO TABS
20.0000 mg | ORAL_TABLET | Freq: Every day | ORAL | Status: DC
  Administered 2016-05-30 – 2016-06-02 (×5): 20 mg via ORAL
  Filled 2016-05-29 (×5): qty 1

## 2016-05-29 MED ORDER — AMIODARONE LOAD VIA INFUSION
150.0000 mg | Freq: Once | INTRAVENOUS | Status: AC
  Administered 2016-05-29: 150 mg via INTRAVENOUS
  Filled 2016-05-29: qty 83.34

## 2016-05-29 MED ORDER — CANAGLIFLOZIN 300 MG PO TABS
300.0000 mg | ORAL_TABLET | Freq: Every day | ORAL | Status: DC
  Administered 2016-05-30 – 2016-06-03 (×4): 300 mg via ORAL
  Filled 2016-05-29 (×5): qty 1

## 2016-05-29 MED ORDER — CANAGLIFLOZIN-METFORMIN HCL 150-1000 MG PO TABS: 1.0000 | ORAL_TABLET | Freq: Two times a day (BID) | ORAL | Status: DC

## 2016-05-29 NOTE — ED Provider Notes (Signed)
CSN: [id number]     Arrival date & time 05/29/16  1344 History   First MD Initiated Contact with Patient 05/29/16 1408     Chief Complaint  Patient presents with  . Chest Pain  . Atrial Fibrillation     (Consider location/radiation/quality/duration/timing/severity/associated sxs/prior Treatment) Patient is a 60 y.o. male presenting with palpitations. The history is provided by the patient.  Palpitations Palpitations quality:  Regular Onset quality:  Sudden Duration:  1 day Timing:  Constant Progression:  Unchanged Chronicity:  Recurrent Context: not stimulant use   Relieved by:  Nothing Worsened by:  Nothing Ineffective treatments:  Beta blockers (calcium channel blockers) Associated symptoms: chest pressure, diaphoresis, near-syncope and weakness   Associated symptoms: no lower extremity edema and no shortness of breath   Risk factors: hx of atrial fibrillation     Past Medical History  Diagnosis Date  . Cataract   . Diabetes mellitus   . Hypertension   . Hyperlipidemia   . Sleep apnea     lost 40 lbs & no longer uses CPAP  . Arthritis   . PAF (paroxysmal atrial fibrillation) ([place]) 01/2016  . CAD (coronary artery disease)     a. LHC on 01/29/16 which revealed significant apical LAD stenosis, best treated medically. There was moderate disease in the D2 and mid nondominant RCA.[name] No PCI performed    Past Surgical History  Procedure Laterality Date  . Colonoscopy    . Polypectomy    . Tonsillectomy    . Nasal sinus surgery    . Ankle surgery Left 2012    tendon repair  . Left knee sugery Left     Arthroscopy  . Cystoscopy w/ ureteral stent placement      right  . Cataract extraction w/phaco  11/11/2012    Procedure: CATARACT EXTRACTION PHACO AND INTRAOCULAR LENS PLACEMENT (IOC);  Surgeon: [name], MD;  Location: [facility];  Service: Ophthalmology;  Laterality: Right;  CDE:  5.56  . Cataract extraction w/phaco Left 12/15/2013    Procedure: CATARACT EXTRACTION PHACO AND  INTRAOCULAR LENS PLACEMENT (IOC);  Surgeon: [name], MD;  Location: [facility];  Service: Ophthalmology;  Laterality: Left;  CDE:13.36  . Cholecystectomy    . Cardiac catheterization [name] 01/29/2016    Procedure: Left Heart Cath and Coronary Angiography;  Surgeon: [name], MD;  Location: [facility];  Service: Cardiovascular;  Laterality: [name];   Family History  Problem Relation Age of Onset  . Colon cancer Neg Hx   . Rectal cancer Neg Hx   . Stomach cancer Neg Hx    Social History  Substance Use Topics  . Smoking status: Never Smoker   . Smokeless tobacco: Never Used  . Alcohol Use: 0.0 oz/week    0 Standard drinks or equivalent per week     Comment: rare occasion    Review of Systems  Constitutional: Positive for diaphoresis.  Respiratory: Negative for shortness of breath.   Cardiovascular: Positive for palpitations and near-syncope.  Neurological: Positive for weakness.  All other systems reviewed and are negative.     Allergies  Bee venom and Codeine  Home Medications   Prior to Admission medications   Medication Sig Start Date End Date Taking? Authorizing Provider  acetaminophen (TYLENOL) 500 MG tablet Take 500 mg by mouth every 6 (six) hours as needed for mild pain or moderate pain.    Historical Provider, MD  aspirin 81 MG tablet Take 81 mg by mouth daily.  Historical Provider, MD  atorvastatin (LIPITOR) 80 MG tablet Take 1 tablet (80 mg total) by mouth daily at 6 PM. 01/30/16   Eileen Stanford, PA-C  benazepril (LOTENSIN) 20 MG tablet Take 20 mg by mouth daily.    Historical Provider, MD  Canagliflozin-Metformin HCl (INVOKAMET) [phone] MG TABS Take 1 tablet by mouth 2 (two) times daily.    Historical Provider, MD  diltiazem (CARDIZEM) 30 MG tablet Take 1 tab 2 times daily as needed for palpitations 03/27/16   Arnoldo Lenis, MD  glimepiride (AMARYL) 4 MG tablet Take 4 mg by mouth daily with breakfast.  07/25/12   Historical Provider, MD   HYDROcodone-acetaminophen (NORCO/VICODIN) 5-325 MG tablet Take 1 tablet by mouth every 4 (four) hours as needed. 02/13/16   Dorie Rank, MD  isosorbide mononitrate (IMDUR) 30 MG 24 hr tablet Take 1 tablet (30 mg total) by mouth daily. 01/30/16   Eileen Stanford, PA-C  metoprolol succinate (TOPROL-XL) 100 MG 24 hr tablet TAKE 1 TABLET BY MOUTH DAILY TAKE WITH OR IMMEDIATELY FOLLOWING A MEAL 05/16/16   Arnoldo Lenis, MD  pantoprazole (PROTONIX) 40 MG tablet Take 40 mg by mouth daily.    Historical Provider, MD  rivaroxaban (XARELTO) 20 MG TABS tablet Take 1 tablet (20 mg total) by mouth daily with supper. 01/30/16   Eileen Stanford, PA-C   BP 124/88 mmHg  Pulse 123  Temp(Src) 98.1 F (36.7 C) (Oral)  Resp 18  SpO2 96% Physical Exam  Constitutional: He is oriented to person, place, and time. He appears well-developed and well-nourished. No distress.  HENT:  Head: Normocephalic and atraumatic.  Eyes: Conjunctivae are normal.  Neck: Neck supple. No tracheal deviation present.  Cardiovascular: Regular rhythm and normal heart sounds.  Tachycardia present.   Pulmonary/Chest: Effort normal and breath sounds normal. No respiratory distress.  Abdominal: Soft. He exhibits no distension.  Neurological: He is alert and oriented to person, place, and time.  Skin: Skin is warm and dry.  Psychiatric: He has a normal mood and affect.  Vitals reviewed.   ED Course  Procedures (including critical care time)  CRITICAL CARE Performed by: Leo Grosser Total critical care time: 30 minutes Critical care time was exclusive of separately billable procedures and treating other patients. Critical care was necessary to treat or prevent imminent or life-threatening deterioration. Critical care was time spent personally by me on the following activities: development of treatment plan with patient and/or surrogate as well as nursing, discussions with consultants, evaluation of patient's response to treatment,  examination of patient, obtaining history from patient or surrogate, ordering and performing treatments and interventions, ordering and review of laboratory studies, ordering and review of radiographic studies, pulse oximetry and re-evaluation of patient's condition.   Labs Review Labs Reviewed  BASIC METABOLIC PANEL - Abnormal; Notable for the following:    CO2 20 (*)    Glucose, Bld 239 (*)    Creatinine, Ser 1.34 (*)    GFR calc non Af Amer 56 (*)    All other components within normal limits  CBC - Abnormal; Notable for the following:    WBC 13.9 (*)    All other components within normal limits  MAGNESIUM  TSH  URINALYSIS, ROUTINE W REFLEX MICROSCOPIC (NOT AT [facility])  I-STAT TROPOININ, ED    Imaging Review No results found. I have personally reviewed and evaluated these images and lab results as part of my medical decision-making.   EKG Interpretation   Date/Time:  Thursday May 29 2016 13:49:11 EDT Ventricular Rate:  159 PR Interval:    QRS Duration: 76 QT Interval:  278 QTC Calculation: 452 R Axis:   54 Text Interpretation:  Atrial flutter with variable A-V block New since  previous tracing Nonspecific ST abnormality Abnormal ECG Confirmed by  Anaaya Fuster MD, Quillian Quince [id number]) on 05/29/2016 2:09:07 PM     MDM   Final diagnoses:  Paroxysmal atrial flutter ([place])   60 year old male presents with paroxysmal atrial flutter from home with a rate of 150-160. He became dizzy and developed some chest pain at home, he felt like he might pass out from lightheadedness and presents for evaluation. His blood pressure stable on arrival here but his heart rate is poorly controlled above 150. He was given 2 doses of IV Lopressor and was able to convert out to a sinus rhythm. I discussed the case with cardiology who agreed to see the patient and help with disposition planning. Patient was monitored on telemetry after intervention without persistent chest pain, lightheadedness or other symptoms  following resolution of abnormal rhythm. Cardiology was consulted for admission and will see the patient in the emergency department.     Leo Grosser, MD 05/29/16 [phone]

## 2016-05-29 NOTE — H&P (Signed)
CARDIOLOGY HISTORY AND PHYSICAL   Patient ID: Lucas [name] MRN: [id number] DOB/AGE: 62-Nov-1957 62 y.o.  Admit date: 05/29/2016  Primary Physician   Glenda Chroman, MD Primary Cardiologist: Dr. Harl Bowie  Requesting MD: Dr. Laneta Simmers Reason for Consultation: Afib/RVR  HPI: [name] is a 60 year old male with a past medical history of diabetes, hypertension, hyperlipidemia, obstructive sleep apnea, paroxysmal atrial fibrillation, and coronary artery disease.  He was working today at his job at [employer] working on [employer] and began to feel like he could pass out. He says that he just didn't feel right. Denies feeling palpitations. He continued working and sat down to eat lunch around 12:30. At that time his chest felt tight. He felt his pulse and it felt irregular. He walked out to his car and continued to have chest tightness and feelings of faintness.   Upon arrival to the emergency room he was found to be in A. fib RVR with a rate of 156. He says that he had a similar episode in March 2017. At that time he spontaneously converted to normal sinus rhythm, and was started on Xarelto. He has taken his Xarelto with high compliance. During that admission his troponin was elevated and he underwent left heart cath which revealed significant apical LAD stenosis that is best treated medically. There was also moderate disease in the D2 and mid nondominant RCA. He had a normal EF but did have some mild LVH.  He again converted spontaneously in the ED for a few minutes, but then developed rapid atrial fib again. He was given 10 mg of IV metoprolol and is currently in normal sinus rhythm.  He denies chest pain at rest. Point of care troponin is negative.   Past Medical History  Diagnosis Date  . Cataract   . Diabetes mellitus   . Hypertension   . Hyperlipidemia   . Sleep apnea     lost 40 lbs & no longer uses CPAP  . Arthritis   . PAF (paroxysmal atrial fibrillation) ([place]) 01/2016  . CAD  (coronary artery disease)     a. LHC on 01/29/16 which revealed significant apical LAD stenosis, best treated medically. There was moderate disease in the D2 and mid nondominant RCA.[name] No PCI performed      Past Surgical History  Procedure Laterality Date  . Colonoscopy    . Polypectomy    . Tonsillectomy    . Nasal sinus surgery    . Ankle surgery Left 2012    tendon repair  . Left knee sugery Left     Arthroscopy  . Cystoscopy w/ ureteral stent placement      right  . Cataract extraction w/phaco  11/11/2012    Procedure: CATARACT EXTRACTION PHACO AND INTRAOCULAR LENS PLACEMENT (IOC);  Surgeon: [name], MD;  Location: [facility];  Service: Ophthalmology;  Laterality: Right;  CDE:  5.56  . Cataract extraction w/phaco Left 12/15/2013    Procedure: CATARACT EXTRACTION PHACO AND INTRAOCULAR LENS PLACEMENT (IOC);  Surgeon: [name], MD;  Location: [facility];  Service: Ophthalmology;  Laterality: Left;  CDE:13.36  . Cholecystectomy    . Cardiac catheterization [name] 01/29/2016    Procedure: Left Heart Cath and Coronary Angiography;  Surgeon: [name], MD;  Location: [facility];  Service: Cardiovascular;  Laterality: [name];    Allergies  Allergen Reactions  . Bee Venom Swelling  . Codeine Nausea Only    I have reviewed the patient's current medications .  metoprolol  5 mg Intravenous Once       Prior to Admission medications   Medication Sig Start Date End Date Taking? Authorizing Provider  acetaminophen (TYLENOL) 500 MG tablet Take 500 mg by mouth every 6 (six) hours as needed for mild pain or moderate pain.    Historical Provider, MD  aspirin 81 MG tablet Take 81 mg by mouth daily.    Historical Provider, MD  atorvastatin (LIPITOR) 80 MG tablet Take 1 tablet (80 mg total) by mouth daily at 6 PM. 01/30/16   Eileen Stanford, PA-C  benazepril (LOTENSIN) 20 MG tablet Take 20 mg by mouth daily.    Historical Provider, MD  Canagliflozin-Metformin HCl (INVOKAMET) [phone] MG TABS  Take 1 tablet by mouth 2 (two) times daily.    Historical Provider, MD  diltiazem (CARDIZEM) 30 MG tablet Take 1 tab 2 times daily as needed for palpitations 03/27/16   Arnoldo Lenis, MD  glimepiride (AMARYL) 4 MG tablet Take 4 mg by mouth daily with breakfast.  07/25/12   Historical Provider, MD  HYDROcodone-acetaminophen (NORCO/VICODIN) 5-325 MG tablet Take 1 tablet by mouth every 4 (four) hours as needed. 02/13/16   Dorie Rank, MD  isosorbide mononitrate (IMDUR) 30 MG 24 hr tablet Take 1 tablet (30 mg total) by mouth daily. 01/30/16   Eileen Stanford, PA-C  metoprolol succinate (TOPROL-XL) 100 MG 24 hr tablet TAKE 1 TABLET BY MOUTH DAILY TAKE WITH OR IMMEDIATELY FOLLOWING A MEAL 05/16/16   Arnoldo Lenis, MD  pantoprazole (PROTONIX) 40 MG tablet Take 40 mg by mouth daily.    Historical Provider, MD  rivaroxaban (XARELTO) 20 MG TABS tablet Take 1 tablet (20 mg total) by mouth daily with supper. 01/30/16   Eileen Stanford, PA-C     Social History   Social History  . Marital Status: Married    Spouse Name: [name]  . Number of Children: [name]  . Years of Education: [name]   Occupational History  . Not on file.   Social History Main Topics  . Smoking status: Never Smoker   . Smokeless tobacco: Never Used  . Alcohol Use: 0.0 oz/week    0 Standard drinks or equivalent per week     Comment: rare occasion  . Drug Use: No  . Sexual Activity: Yes    Birth Control/ Protection: None   Other Topics Concern  . Not on file   Social History Narrative    Family Status  Relation Status Death Age  . Mother Deceased   . Father Deceased    Family History  Problem Relation Age of Onset  . Colon cancer Neg Hx   . Rectal cancer Neg Hx   . Stomach cancer Neg Hx      ROS:  Full 14 point review of systems complete and found to be negative unless listed above.  Physical Exam: Blood pressure 110/77, pulse 149, temperature 98.1 F (36.7 C), temperature source Oral, resp. rate 15, SpO2 95 %.    General: Well developed, well nourished, male in no acute distress Head: Eyes PERRLA, No xanthomas.  Normocephalic and atraumatic, oropharynx without edema or exudate.  Lungs: CTA Heart: HRRR S1 S2, no rub/gallop, No murmur. pulses are 2+ extrem.   Neck: No carotid bruits. No lymphadenopathy.  No JVD. Abdomen: Bowel sounds present, abdomen soft and non-tender without masses or hernias noted. Msk:  No spine or cva tenderness. No weakness, no joint deformities or effusions. Extremities: No clubbing or cyanosis.  No  edema.  Neuro: Alert and oriented X 3. No focal deficits noted. Psych:  Good affect, responds appropriately Skin: No rashes or lesions noted.  Labs:   Lab Results  Component Value Date   WBC 13.9* 05/29/2016   HGB 16.5 05/29/2016   HCT 47.0 05/29/2016   MCV 90.9 05/29/2016   PLT 176 05/29/2016    Recent Labs Lab 05/29/16 1356  NA 137  K 4.5  CL 107  CO2 20*  BUN 17  CREATININE 1.34*  CALCIUM 9.5  GLUCOSE 239*   MAGNESIUM  Date Value Ref Range Status  05/29/2016 2.0 1.7 - 2.4 mg/dL Final    Recent Labs  05/29/16 1444  TROPIPOC 0.00    Echo: 01/29/16 Study Conclusions  - Left ventricle: The cavity size was mildly dilated. Wall  thickness was increased in a pattern of mild LVH. Systolic  function was normal. The estimated ejection fraction was in the  range of 55% to 60%.  ECG:  Afib RVR  Radiology:  No results found.  ASSESSMENT AND PLAN:    Active Problems:   * No active hospital problems. *  1. Atrial fibrillation with rapid ventricular response: Patient presents with chest tightness and feelings of presyncope. He was found to be in atrial fibrillation upon arrival to the ED with a rate of 159. He has a history of paroxysmal A. fib and has had a similar episode like this four months ago. He is on Xarelto and has taken it with high compliance. He converted with the administration of IV metoprolol. He has diltiazem ordered when necessary, but  did not have it with him at onset of symptoms. He is already on beta blocker. Will put on Amiodarone gtt. And transition to po Amio tomorrow.   We will also place on oral diltiazem 30mg  q 8 and transition to long acting when he goes home.   This patients CHA2DS2-VASc Score and unadjusted Ischemic Stroke Rate (% per year) is equal to 4.8 % stroke rate/year from a score of 4 Above score calculated as 1 point each if present [CHF, HTN, DM, Vascular=MI/PAD/Aortic Plaque, Age if 65-74, or Male], 2 points each if present [Age > 75, or Stroke/TIA/TE]   2. Chest pain: Reports chest pain today associated with his rapid heart rate. He tells me that he does occasionally get chest tightness with exertion. He has known apical LAD stenosis. Can consider increasing his isosorbide to 60 mg daily. Will follow troponin.    3. Elevated white blood cell count: Denies any recent fevers, denies cough, sore throat. Will obtain UA to just rule out any urinary infection. His chest X ray is still pending.   4. HTN: Well controlled on current therapy. Going to hold home ACE-I as his creatinine is 1.34.    Signed: Arbutus Leas, NP 05/29/2016 3:17 PM Pager [phone]  Co-Sign MD  Patient examined chart reviewed. Recurrent PAF with history of CAD. Exam with post cholycystectomy clear lungs And normal heart sounds. Trace LE edema will need AAT.  Start iv amiodarone and SA cardizem If maintains NSR Can d/c am on amiodarone 200 bid and cardizem 120 CD daily. R/O don't think need to stop anticoagulation for Ischemic evaluation as symptoms appear to be related to rhythm change. Elevated WBC no fever check UA CXR pending Discussed care plan with patient and wife  [name]

## 2016-05-29 NOTE — ED Notes (Signed)
Pt taken to xray 

## 2016-05-29 NOTE — ED Notes (Signed)
Spoke to Camera operator.  Pt will be moving to D32 when room is ready.  Triage RN aware of same.

## 2016-05-29 NOTE — ED Notes (Signed)
Cp started  When he was at work today got dizzy and felt syncopal, felt like his heart was racing

## 2016-05-30 ENCOUNTER — Encounter (HOSPITAL_COMMUNITY): Payer: Self-pay | Admitting: Physician Assistant

## 2016-05-30 DIAGNOSIS — I10 Essential (primary) hypertension: Secondary | ICD-10-CM | POA: Diagnosis not present

## 2016-05-30 DIAGNOSIS — I480 Paroxysmal atrial fibrillation: Secondary | ICD-10-CM | POA: Diagnosis not present

## 2016-05-30 DIAGNOSIS — I483 Typical atrial flutter: Secondary | ICD-10-CM | POA: Diagnosis not present

## 2016-05-30 DIAGNOSIS — I4891 Unspecified atrial fibrillation: Secondary | ICD-10-CM | POA: Diagnosis not present

## 2016-05-30 DIAGNOSIS — N179 Acute kidney failure, unspecified: Secondary | ICD-10-CM

## 2016-05-30 DIAGNOSIS — E669 Obesity, unspecified: Secondary | ICD-10-CM | POA: Insufficient documentation

## 2016-05-30 DIAGNOSIS — E785 Hyperlipidemia, unspecified: Secondary | ICD-10-CM | POA: Diagnosis not present

## 2016-05-30 DIAGNOSIS — D72829 Elevated white blood cell count, unspecified: Secondary | ICD-10-CM

## 2016-05-30 LAB — BASIC METABOLIC PANEL
Anion gap: 7 (ref 5–15)
Anion gap: 8 (ref 5–15)
BUN: 14 mg/dL (ref 6–20)
BUN: 15 mg/dL (ref 6–20)
CO2: 28 mmol/L (ref 22–32)
CO2: 29 mmol/L (ref 22–32)
Calcium: 9 mg/dL (ref 8.9–10.3)
Calcium: 9.5 mg/dL (ref 8.9–10.3)
Chloride: 105 mmol/L (ref 101–111)
Chloride: 103 mmol/L (ref 101–111)
Creatinine, Ser: 1.03 mg/dL (ref 0.61–1.24)
Creatinine, Ser: 1.11 mg/dL (ref 0.61–1.24)
GFR calc Af Amer: >60 mL/min (ref >60)
GFR calc Af Amer: >60 mL/min (ref >60)
GFR calc non Af Amer: >60 mL/min (ref >60)
GFR calc non Af Amer: >60 mL/min (ref >60)
Glucose, Bld: 100 mg/dL — ABNORMAL HIGH (ref 65–99)
Glucose, Bld: 153 mg/dL — ABNORMAL HIGH (ref 65–99)
Potassium: 3.9 mmol/L (ref 3.5–5.1)
Potassium: 5.2 mmol/L — ABNORMAL HIGH (ref 3.5–5.1)
Sodium: 140 mmol/L (ref 135–145)
Sodium: 140 mmol/L (ref 135–145)

## 2016-05-30 LAB — CBC
HCT: 44.6 % (ref 39.0–52.0)
Hemoglobin: 15 g/dL (ref 13.0–17.0)
MCH: 31.1 pg (ref 26.0–34.0)
MCHC: 33.6 g/dL (ref 30.0–36.0)
MCV: 92.3 fL (ref 78.0–100.0)
Platelets: 142 10*3/uL — ABNORMAL LOW (ref 150–400)
RBC: 4.83 MIL/uL (ref 4.22–5.81)
RDW: 14.1 % (ref 11.5–15.5)
WBC: 8.7 10*3/uL (ref 4.0–10.5)

## 2016-05-30 LAB — HEPATIC FUNCTION PANEL
ALT: 34 U/L (ref 17–63)
AST: 30 U/L (ref 15–41)
Albumin: 3.6 g/dL (ref 3.5–5.0)
Alkaline Phosphatase: 84 U/L (ref 38–126)
Bilirubin, Direct: 0.2 mg/dL (ref 0.1–0.5)
Indirect Bilirubin: 1 mg/dL — ABNORMAL HIGH (ref 0.3–0.9)
Total Bilirubin: 1.2 mg/dL (ref 0.3–1.2)
Total Protein: 6.6 g/dL (ref 6.5–8.1)

## 2016-05-30 LAB — GLUCOSE, CAPILLARY
Glucose-Capillary: 149 mg/dL — ABNORMAL HIGH (ref 65–99)
Glucose-Capillary: 119 mg/dL — ABNORMAL HIGH (ref 65–99)
Glucose-Capillary: 131 mg/dL — ABNORMAL HIGH (ref 65–99)

## 2016-05-30 LAB — MAGNESIUM
Magnesium: 2 mg/dL (ref 1.7–2.4)
Magnesium: 1.9 mg/dL (ref 1.7–2.4)

## 2016-05-30 MED ORDER — INSULIN ASPART 100 UNIT/ML ~~LOC~~ SOLN: 0.0000 [IU] | Freq: Every day | SUBCUTANEOUS | Status: DC

## 2016-05-30 MED ORDER — DILTIAZEM HCL ER COATED BEADS 120 MG PO CP24
120.0000 mg | ORAL_CAPSULE | Freq: Every day | ORAL | Status: DC
  Administered 2016-05-30 – 2016-06-03 (×4): 120 mg via ORAL
  Filled 2016-05-30 (×5): qty 1

## 2016-05-30 MED ORDER — POTASSIUM CHLORIDE CRYS ER 20 MEQ PO TBCR
40.0000 meq | EXTENDED_RELEASE_TABLET | Freq: Every day | ORAL | Status: DC
  Administered 2016-05-31 – 2016-06-01 (×2): 40 meq via ORAL
  Filled 2016-05-30 (×2): qty 2

## 2016-05-30 MED ORDER — SODIUM CHLORIDE 0.9 % IV SOLN: 250.0000 mL | INTRAVENOUS | Status: DC | PRN

## 2016-05-30 MED ORDER — SODIUM CHLORIDE 0.9% FLUSH
3.0000 mL | Freq: Two times a day (BID) | INTRAVENOUS | Status: DC
  Administered 2016-05-30 – 2016-06-02 (×5): 3 mL via INTRAVENOUS

## 2016-05-30 MED ORDER — POTASSIUM CHLORIDE CRYS ER 20 MEQ PO TBCR
40.0000 meq | EXTENDED_RELEASE_TABLET | Freq: Two times a day (BID) | ORAL | Status: DC
  Administered 2016-05-30 (×2): 40 meq via ORAL
  Filled 2016-05-30 (×2): qty 2

## 2016-05-30 MED ORDER — SODIUM CHLORIDE 0.9% FLUSH
3.0000 mL | INTRAVENOUS | Status: DC | PRN
  Administered 2016-05-31: 3 mL via INTRAVENOUS
  Filled 2016-05-30: qty 3

## 2016-05-30 MED ORDER — INSULIN ASPART 100 UNIT/ML ~~LOC~~ SOLN
0.0000 [IU] | Freq: Three times a day (TID) | SUBCUTANEOUS | Status: DC
  Administered 2016-05-31 – 2016-06-02 (×2): 2 [IU] via SUBCUTANEOUS

## 2016-05-30 MED ORDER — DOFETILIDE 250 MCG PO CAPS: 500.0000 ug | ORAL_CAPSULE | Freq: Two times a day (BID) | ORAL | Status: DC

## 2016-05-30 NOTE — Progress Notes (Signed)
Pt. Does not want insulin has been managing blood sugar with PO meds, spoke with Idolina Primer, PA

## 2016-05-30 NOTE — Progress Notes (Signed)
Care order placed for me to call lab, however labs was drawn at 3am.

## 2016-05-30 NOTE — Consult Note (Signed)
ELECTROPHYSIOLOGY CONSULT NOTE    Patient ID: Lucas [name] MRN: [id number], DOB/AGE: 03-03-1956 60 y.o.  Admit date: 05/29/2016 Date of Consult: 05/30/2016  Primary Physician: Glenda Chroman, MD Primary Cardiologist: Dr. Harl Bowie Requesting MD: Dr. Johnsie Cancel  Reason for Consultation: AFib, antiarrhythmic therapy   HPI: Lucas [name] is a 60 y.o. male with PMHx of DM, HLD, OSA pending results of his CPAP titratin eval, CAD, and PAFib admitted to [facility] yesterday with c/o chest tightness though he states was more of a pressure on his neck, and near syncope, he brought to the ER noted to be in AFib RVR, spontaneously converted to SR in the ED with recurrent rapid AF only a few minutes later treated with IV BB, maintaining SR since.  He was evaluated by cardiology, started on PO diltiazem and IV amiodarone.  Though given his young age 4 stopped today and EP is being asked to see the patient regarding his AAD therapy options.  His symptoms this visit very similar to his visit in March where he was 1st found with AF, underwent cath at that time with medical management of his CAD.  LABS: K+ 4.5 >> 3.9 BUN/Creat 17/1.34 >> 14/1.03 Mag 2.0 poc Trop 0.00 H/H 15/44 WBC 13.9 >> 8.7 Plts 142 TSH 1.832   AFib hx: First diagnosis March 2017 (similar presentation/symptoms to current) Xarelto started March 2017 with reported 100% compliance Home meds include Toprol XL and PRN diltiazem 30mg  tabs, he denies any other medications for his AF  Discussed Tikosyn initiation protocol, risks of QT prolongation/risks, 6 doses in-hospital with the patient and wife, he is agreeable to proceed, OK with the 3 days in-hospital, discussed EKG's, blood monitoring.  Past Medical History  Diagnosis Date  . Cataract   . Diabetes mellitus   . Hypertension   . Hyperlipidemia   . Sleep apnea   . Arthritis   . PAF (paroxysmal atrial fibrillation) ([name]) 01/2016  . CAD (coronary artery disease)     a. LHC on 01/29/16  which revealed significant apical LAD stenosis, best treated medically. There was moderate disease in the D2 and mid nondominant RCA.[name] No PCI performed   . Obesity      Surgical History:  Past Surgical History  Procedure Laterality Date  . Colonoscopy    . Polypectomy    . Tonsillectomy    . Nasal sinus surgery    . Ankle surgery Left 2012    tendon repair  . Left knee sugery Left     Arthroscopy  . Cystoscopy w/ ureteral stent placement      right  . Cataract extraction w/phaco  11/11/2012    Procedure: CATARACT EXTRACTION PHACO AND INTRAOCULAR LENS PLACEMENT (IOC);  Surgeon: [name], MD;  Location: [facility];  Service: Ophthalmology;  Laterality: Right;  CDE:  5.56  . Cataract extraction w/phaco Left 12/15/2013    Procedure: CATARACT EXTRACTION PHACO AND INTRAOCULAR LENS PLACEMENT (IOC);  Surgeon: [name], MD;  Location: [facility];  Service: Ophthalmology;  Laterality: Left;  CDE:13.36  . Cholecystectomy    . Cardiac catheterization [name] 01/29/2016    Procedure: Left Heart Cath and Coronary Angiography;  Surgeon: [name], MD;  Location: [place] [facility];  Service: Cardiovascular;  Laterality: [name];     Prescriptions prior to admission  Medication Sig Dispense Refill Last Dose  . acetaminophen (TYLENOL) 500 MG tablet Take 500 mg by mouth every 6 (six) hours as needed for mild pain or moderate pain.  Past Month at Unknown time  . aspirin 81 MG tablet Take 81 mg by mouth daily.   05/29/2016 at Unknown time  . atorvastatin (LIPITOR) 80 MG tablet Take 1 tablet (80 mg total) by mouth daily at 6 PM. 90 tablet 3 05/28/2016 at Unknown time  . benazepril (LOTENSIN) 20 MG tablet Take 20 mg by mouth daily.   05/29/2016 at Unknown time  . Canagliflozin-Metformin HCl (INVOKAMET) 701 [id number] MG TABS Take 1 tablet by mouth 2 (two) times daily.   05/29/2016 at Unknown time  . glimepiride (AMARYL) 4 MG tablet Take 2-4 mg by mouth 2 (two) times daily. 4mg  in the morning and 2mg  in the evening   05/29/2016  at Unknown time  . HYDROcodone-acetaminophen (NORCO/VICODIN) 5-325 MG tablet Take 1 tablet by mouth every 4 (four) hours as needed. 16 tablet 0 Past Month at Unknown time  . isosorbide mononitrate (IMDUR) 30 MG 24 hr tablet Take 1 tablet (30 mg total) by mouth daily. 90 tablet 3 05/29/2016 at Unknown time  . metoprolol succinate (TOPROL-XL) 100 MG 24 hr tablet TAKE 1 TABLET BY MOUTH DAILY TAKE WITH OR IMMEDIATELY FOLLOWING A MEAL 60 tablet 6 05/29/2016 at 0515  . pantoprazole (PROTONIX) 40 MG tablet Take 40 mg by mouth daily.   05/29/2016 at Unknown time  . rivaroxaban (XARELTO) 20 MG TABS tablet Take 1 tablet (20 mg total) by mouth daily with supper. 30 tablet 11 05/28/2016 at Unknown time  . diltiazem (CARDIZEM) 30 MG tablet Take 1 tab 2 times daily as needed for palpitations 180 tablet 3 never used    Inpatient Medications:  . aspirin EC  81 mg Oral Daily  . atorvastatin  80 mg Oral q1800  . canagliflozin  300 mg Oral Q breakfast   And  . metFORMIN  1,000 mg Oral BID WC  . diltiazem  120 mg Oral Daily  . glimepiride  4 mg Oral Q breakfast   And  . glimepiride  2 mg Oral QAC supper  . insulin aspart  0-5 Units Subcutaneous QHS  . insulin aspart  0-9 Units Subcutaneous TID WC  . isosorbide mononitrate  30 mg Oral Daily  . metoprolol succinate  100 mg Oral Daily  . pantoprazole  40 mg Oral Daily  . potassium chloride  40 mEq Oral BID  . rivaroxaban  20 mg Oral Q supper    Allergies:  Allergies  Allergen Reactions  . Bee Venom Swelling  . Codeine Nausea Only    Social History   Social History  . Marital Status: Married    Spouse Name: [name]  . Number of Children: [name]  . Years of Education: [name]   Occupational History  . Not on file.   Social History Main Topics  . Smoking status: Never Smoker   . Smokeless tobacco: Never Used  . Alcohol Use: 0.0 oz/week    0 Standard drinks or equivalent per week     Comment: rare occasion  . Drug Use: No  . Sexual Activity: Yes    Birth  Control/ Protection: None   Other Topics Concern  . Not on file   Social History Narrative     Family History  Problem Relation Age of Onset  . Colon cancer Neg Hx   . Rectal cancer Neg Hx   . Stomach cancer Neg Hx      Review of Systems: All other systems reviewed and are otherwise negative except as noted above.  Physical Exam: Filed Vitals:  05/29/16 2230 05/29/16 2259 05/30/16 0448 05/30/16 0850  BP: 119/74 124/59 109/51 125/63  Pulse: 70 69 57 57  Temp:  98.1 F (36.7 C) 98 F (36.7 C)   TempSrc:  Oral Oral   Resp: 21 19 18    Height:  5\' 10"  (1.778 m)    Weight:  276 lb 14.4 oz (125.601 kg)    SpO2: 96% 97% 98%     GEN- The patient is well appearing, alert and oriented x 3 today.   HEENT: normocephalic, atraumatic; sclera clear, conjunctiva pink; hearing intact; oropharynx clear; neck supple, no JVP Lymph- no cervical lymphadenopathy Lungs- Clear to ausculation bilaterally, normal work of breathing.  No wheezes, rales, rhonchi Heart- Regular rate and rhythm, no murmurs, rubs or gallops, PMI not laterally displaced GI- soft, non-tender, non-distended Extremities- no clubbing, cyanosis, or edema MS- no significant deformity or atrophy Skin- warm and dry, no rash or lesion Psych- euthymic mood, full affect Neuro- no gross deficits observed  Labs:   Lab Results  Component Value Date   WBC 8.7 05/30/2016   HGB 15.0 05/30/2016   HCT 44.6 05/30/2016   MCV 92.3 05/30/2016   PLT 142* 05/30/2016    Recent Labs Lab 05/30/16 0427  NA 140  K 3.9  CL 105  CO2 28  BUN 14  CREATININE 1.03  CALCIUM 9.0  PROT 6.6  BILITOT 1.2  ALKPHOS 84  ALT 34  AST 30  GLUCOSE 100*      Radiology/Studies:  Dg Chest 2 View 05/29/2016  CLINICAL DATA:  Mid chest tightness, dizziness and shortness of breath beginning at 8 a.m. this morning. EXAM: CHEST  2 VIEW COMPARISON:  PA and lateral chest 03/08/2016. FINDINGS: The lungs are clear. Heart size is normal. No pneumothorax  or pleural effusion. Remote left rib fractures are noted. Defibrillator pad is in place. IMPRESSION: No acute disease. Electronically Signed   By: Inge Rise M.D.   On: 05/29/2016 16:08    EKG: rapid Afib, 159bpm SR, QTc 434ms  TELEMETRY: SR 60's  01/29/16 Echocardiogram Study Conclusions - Left ventricle: The cavity size was mildly dilated. Wall  thickness was increased in a pattern of mild LVH. Systolic  function was normal. The estimated ejection fraction was in the  range of 55% to 60%. LA 27mm  01/29/16: LHC Conclusion    1. Dist LAD lesion, 80% stenosed. 2. Mid RCA lesion, 60% stenosed. 3. Ost 2nd Diag to 2nd Diag lesion, 60% stenosed. 4. 2nd Mrg lesion, 30% stenosed. 5. Mid LAD lesion, 25% stenosed. 6. LPDA lesion, 45% stenosed. Significant apical LAD stenosis, best treated with medication. Moderate disease in the second diagonal and mid nondominant RCA.Normal left ventricular systolic function. Normal left ventricular filling pressures.  Recommendations: Medical therapy of both atrial fibrillation and coronary artery disease. Percutaneous intervention is not currently indicated for coronary disease.     Assessment and Plan:   1. PAFib, RVR      SR currently     Briefly on IV amiodarone appears 10 hours, no other AAD history     D/w Dr. Caryl Comes, given brief time of amiodarone, OK to start Tikosyn     Reviewed EKG with Dr. Caryl Comes, we will start Tikosyn      K+ 3.9 (has been replaced, will repeat)     Mag 2.0     Creat 1.03 (Calc Cr. Cl 133.36), ok for 575mcg BID dose     Case management and pharmacy for Tikosyn as well  2. CP/CAD     Known CAD on medical management     Deferred to primary cards     Provoked with rapid AFib     Not an ongoing complaint in SR  3. HTN     stable  4. OSA     pending completion of his sleep apnea evaluation/management     Discussed with the patient importance in f/u on this in management of his AF as well.  5. AKI      Improved today   Signed, Tommye Standard, PA-C 05/30/2016 11:11 AM  Pt seen and interviewed and examined  Pts strips were all reviewed and they are more consistent with atrial FLUTTER not FIB The ECG from 4/17 is most clarifying  This explains the rate and the assoc with presyncope  CHADSVASC score 2 so correct for Rivaroxaban but need not be on ASA  Hence i think the primary strategy should be ablation and I have reached out to Dr [facility] to see whether he is able to do without anesthesia on Mon-- if not then could be done by Dr Elliot Cousin on Tuesday  will  His sleep study AHI 10  prob would benefit from oral appliance

## 2016-05-30 NOTE — Progress Notes (Addendum)
Patient: Lucas [name] / Admit Date: 05/29/2016 / Date of Encounter: 05/30/2016, 8:14 AM   Subjective: No chest pain or SOB. Feeling better.   Objective: Telemetry: NSR/SB upper 50s Physical Exam: Blood pressure 109/51, pulse 57, temperature 98 F (36.7 C), temperature source Oral, resp. rate 18, height 5\' 10"  (1.778 m), weight 276 lb 14.4 oz (125.601 kg), SpO2 98 %. General: Well developed, well nourished obese WM in no acute distress. Head: Normocephalic, atraumatic, sclera non-icteric, no xanthomas, nares are without discharge. Neck: Negative for carotid bruits. JVP not elevated. Lungs: Clear bilaterally to auscultation without wheezes, rales, or rhonchi. Breathing is unlabored. Heart: RRR S1 S2 without murmurs, rubs, or gallops.  Abdomen: Soft, non-tender, non-distended with normoactive bowel sounds. No rebound/guarding. Extremities: No clubbing or cyanosis. No edema. Distal pedal pulses are 2+ and equal bilaterally. Neuro: Alert and oriented X 3. Moves all extremities spontaneously. Psych:  Responds to questions appropriately with a normal affect.   Intake/Output Summary (Last 24 hours) at 05/30/16 0814 Last data filed at 05/30/16 0600  Gross per 24 hour  Intake      0 ml  Output    700 ml  Net   -700 ml    Inpatient Medications:  . aspirin EC  81 mg Oral Daily  . atorvastatin  80 mg Oral q1800  . canagliflozin  300 mg Oral Q breakfast   And  . metFORMIN  1,000 mg Oral BID WC  . diltiazem  120 mg Oral Daily  . glimepiride  4 mg Oral Q breakfast   And  . glimepiride  2 mg Oral QAC supper  . isosorbide mononitrate  30 mg Oral Daily  . metoprolol succinate  100 mg Oral Daily  . pantoprazole  40 mg Oral Daily  . rivaroxaban  20 mg Oral Q supper   Infusions:  . amiodarone 30 mg/hr (05/30/16 0536)    Labs:  Recent Labs  05/29/16 1356 05/30/16 0427  NA 137 140  K 4.5 3.9  CL 107 105  CO2 20* 28  GLUCOSE 239* 100*  BUN 17 14  CREATININE 1.34* 1.03  CALCIUM  9.5 9.0  MG 2.0  --    No results for input(s): AST, ALT, ALKPHOS, BILITOT, PROT, ALBUMIN in the last 72 hours.  Recent Labs  05/29/16 1356 05/30/16 0427  WBC 13.9* 8.7  HGB 16.5 15.0  HCT 47.0 44.6  MCV 90.9 92.3  PLT 176 142*   No results for input(s): CKTOTAL, CKMB, TROPONINI in the last 72 hours. Invalid input(s): POCBNP No results for input(s): HGBA1C in the last 72 hours.   Radiology/Studies:  Dg Chest 2 View  05/29/2016  CLINICAL DATA:  Mid chest tightness, dizziness and shortness of breath beginning at 8 a.m. this morning. EXAM: CHEST  2 VIEW COMPARISON:  PA and lateral chest 03/08/2016. FINDINGS: The lungs are clear. Heart size is normal. No pneumothorax or pleural effusion. Remote left rib fractures are noted. Defibrillator pad is in place. IMPRESSION: No acute disease. Electronically Signed   By: Inge Rise M.D.   On: 05/29/2016 16:08     Assessment and Plan  76M with obesity (Body mass index is 39.73 kg/(m^2).), DM, HTN, HLD, OSA, PAF, CAD (LHC 01/2016: significant apical LAD stenosis, best tx medically, mod dz in D2, RCA) admitted with pre-syncope and recurrent AF RVR. Spont conv in ER this adm -> recurred -> IV metoprolol -> NSR.  1. Paroxysmal atrial fib - suspect exacerbated by borderline morbid obesity and untreated  OSA. He recently had updated sleep study and said he is waiting to hear back regarding CPAP titration. Change diltiazem to 120mg  daily. Continue BB. Will discuss plan for antiarrhythmic therapy with MD - Left atrial dimension only 33 on echo in 01/2016, quite young to begin long-term amio. Would benefit from f/u in Afib clinic given ancillary nutrition seminars and close f/u.  2. CAD - will clarify with MD if he needs to be on both ASA + Xarelto. Continue BB, statin, Imdur. No anginal sx in absence of AF.  3. HTN - controlled. Hold ACEI at DC given tendency for lower BP this AM and need for AVN blocker titration. Can consider resuming at lower dose in  follow-up for nephroprotection.  4. Leukocytosis - improved. UA with glucose but otherwise no infxn. CXR NAD.  5. AKI - improved with holding ACEI.  6. Obesity - see above.  SignedMelina Copa PA-C Pager: [phone]   Patient examined chart reviewed. Recurrent PAF with CAD Favor initiation of tikosyn. Have ordered Kdur ( K 3.9 ) and Mg Level asked EP to evaluate. Agree that he is too young for amiodarone Orally given side effect profile. Telemetry this am NSR PAC;s    [name]

## 2016-05-30 NOTE — Progress Notes (Signed)
Called admission nurse

## 2016-05-30 NOTE — Progress Notes (Signed)
Pharmacy Review for Dofetilide (Tikosyn) Initiation  Admit Complaint: 60 y.o. male admitted 05/29/2016 with atrial fibrillation to be initiated on dofetilide.   Pt was on an amiodarone gtt for about 12 hours - completed this morning at 1030.  The MD is aware. Since the amio infusion was on for such a short time, it is ok to proceed with Tikosyn.     Assessment:  YES  NO Patient  Exclusion Criteria Correction Plan  []  [x]  Baseline QTc interval is greater than or equal to 440 msec. IF above YES box checked dofetilide contraindicated unless patient has ICD; then may proceed if QTc 500-550 msec or with known ventricular conduction abnormalities may proceed with QTc 550-600 msec. QTc = 0.43   []  [x]  Magnesium level is less than 1.8 mEq/l : Last magnesium:  Lab Results  Component Value Date   MG 2.0 05/30/2016         [x]  []  Potassium level is less than 4 mEq/l : Last potassium:  Lab Results  Component Value Date   K 3.9 05/30/2016      K is 3.9 but pt has Kdur 40 mEq bid ordered    []  [x]  Patient is known or suspected to have a digoxin level greater than 2 ng/ml: No results found for: DIGOXIN    []  [x]  Creatinine clearance less than 20 ml/min (calculated using Cockcroft-Gault, actual body weight and serum creatinine): Estimated Creatinine Clearance: 100.1 mL/min (by C-G formula based on Cr of 1.03).    [x]  []  Patient has received drugs known to prolong the QT intervals within the last 48 hours (phenothiazines, tricyclics or tetracyclic antidepressants, erythromycin, H-1 antihistamines, cisapride, fluoroquinolones, azithromycin). Drugs not listed above may have an, as yet, undetected potential to prolong the QT interval, updated information on QT prolonging agents is available at this website:QT prolonging agents  Amio - see below   []  [x]  Patient received a dose of hydrochlorothiazide (Oretic) alone or in any combination including triamterene (Dyazide, Maxzide) in the last 48 hours.    []  [x]  Patient received a medication known to increase dofetilide plasma concentrations prior to initial dofetilide dose:  . Trimethoprim (Primsol, Proloprim) in the last 36 hours . Verapamil (Calan, Verelan) in the last 36 hours or a sustained release dose in the last 72 hours . Megestrol (Megace) in the last 5 days  . Cimetidine (Tagamet) in the last 6 hours . Ketoconazole (Nizoral) in the last 24 hours . Itraconazole (Sporanox) in the last 48 hours  . Prochlorperazine (Compazine) in the last 36 hours    []  [x]  Patient is known to have a history of torsades de pointes; congenital or acquired long QT syndromes.   []  [x]  Patient has received a Class 1 antiarrhythmic with less than 2 half-lives since last dose. (Disopyramide, Quinidine, Procainamide, Lidocaine, Mexiletine, Flecainide, Propafenone)   [x]  []  Patient has received amiodarone therapy in the past 3 months or amiodarone level is greater than 0.3 ng/ml.    Patient has been appropriately anticoagulated with Xarelto .  Ordering provider was confirmed at [email or web address] if they are not listed on the [place] Prescribers list.  Goal of Therapy: Follow renal function, electrolytes, potential drug interactions, and dose adjustment. Provide education and 1 week supply at discharge.  Plan:  [x]   Physician selected initial dose within range recommended for patients level of renal function - will monitor for response.  []   Physician selected initial dose outside of range recommended for patients level of renal function -  will discuss if the dose should be altered at this time.   Select One Calculated CrCl  Dose q12h  [x]  > 60 ml/min 500 mcg  []  40-60 ml/min 250 mcg  []  20-40 ml/min 125 mcg   2. Follow up QTc after the first 5 doses, renal function, electrolytes (K & Mg) daily x 3     days, dose adjustment, success of initiation and facilitate 1 week discharge supply as     clinically indicated.  3. Initiate Tikosyn  education video (Call [phone] and ask for video # [phone]).  4. Place Enrollment Form on the chart for discharge supply of dofetilide.      Hughes Better, PharmD, BCPS Clinical Pharmacist 05/30/2016 3:43 PM

## 2016-05-31 ENCOUNTER — Encounter (HOSPITAL_COMMUNITY): Payer: Self-pay | Admitting: *Deleted

## 2016-05-31 DIAGNOSIS — I10 Essential (primary) hypertension: Secondary | ICD-10-CM

## 2016-05-31 DIAGNOSIS — I483 Typical atrial flutter: Secondary | ICD-10-CM | POA: Diagnosis not present

## 2016-05-31 DIAGNOSIS — I4892 Unspecified atrial flutter: Secondary | ICD-10-CM | POA: Diagnosis not present

## 2016-05-31 DIAGNOSIS — I25119 Atherosclerotic heart disease of native coronary artery with unspecified angina pectoris: Secondary | ICD-10-CM

## 2016-05-31 DIAGNOSIS — G4733 Obstructive sleep apnea (adult) (pediatric): Secondary | ICD-10-CM

## 2016-05-31 DIAGNOSIS — I2510 Atherosclerotic heart disease of native coronary artery without angina pectoris: Secondary | ICD-10-CM | POA: Diagnosis not present

## 2016-05-31 LAB — BASIC METABOLIC PANEL
Anion gap: 8 (ref 5–15)
BUN: 14 mg/dL (ref 6–20)
CO2: 24 mmol/L (ref 22–32)
Calcium: 9 mg/dL (ref 8.9–10.3)
Chloride: 105 mmol/L (ref 101–111)
Creatinine, Ser: 0.88 mg/dL (ref 0.61–1.24)
GFR calc Af Amer: >60 mL/min (ref >60)
GFR calc non Af Amer: >60 mL/min (ref >60)
Glucose, Bld: 82 mg/dL (ref 65–99)
Potassium: 4.6 mmol/L (ref 3.5–5.1)
Sodium: 137 mmol/L (ref 135–145)

## 2016-05-31 LAB — MAGNESIUM: Magnesium: 1.8 mg/dL (ref 1.7–2.4)

## 2016-05-31 LAB — GLUCOSE, CAPILLARY
Glucose-Capillary: 76 mg/dL (ref 65–99)
Glucose-Capillary: 160 mg/dL — ABNORMAL HIGH (ref 65–99)
Glucose-Capillary: 188 mg/dL — ABNORMAL HIGH (ref 65–99)
Glucose-Capillary: 175 mg/dL — ABNORMAL HIGH (ref 65–99)

## 2016-05-31 NOTE — Progress Notes (Signed)
Primary cardiologist: Dr. Carlyle Dolly  Seen for followup: CAD, atrial flutter  Subjective:    Currently no chest pain or dizziness in sinus rhythm. Stable appetite. No shortness of breath.  Objective:   Temp:  [97.6 F (36.4 C)-98.1 F (36.7 C)] 97.6 F (36.4 C) (07/22 0526) Pulse Rate:  [51-57] 51 (07/22 0526) Resp:  [17-18] 18 (07/22 0526) BP: (106-143)/(51-78) 143/78 mmHg (07/22 0526) SpO2:  [96 %-99 %] 99 % (07/22 0526)    Filed Weights   05/29/16 2259  Weight: 276 lb 14.4 oz (125.601 kg)    Intake/Output Summary (Last 24 hours) at 05/31/16 0750 Last data filed at 05/30/16 1745  Gross per 24 hour  Intake    720 ml  Output    600 ml  Net    120 ml    Telemetry: Sinus rhythm.  Exam:  General: Obese male in no distress.  Lungs: Decreased breath sounds but clear.  Cardiac: RRR without gallop.  Abdomen: NABS.  Extremities: Trace ankle edema.  Lab Results:  Basic Metabolic Panel:  Recent Labs Lab 05/30/16 0427 05/30/16 1144 05/30/16 1759 05/31/16 0239  NA 140  --  140 137  K 3.9  --  5.2* 4.6  CL 105  --  103 105  CO2 28  --  29 24  GLUCOSE 100*  --  153* 82  BUN 14  --  15 14  CREATININE 1.03  --  1.11 0.88  CALCIUM 9.0  --  9.5 9.0  MG  --  2.0 1.9 1.8    Liver Function Tests:  Recent Labs Lab 05/30/16 0427  AST 30  ALT 34  ALKPHOS 84  BILITOT 1.2  PROT 6.6  ALBUMIN 3.6    CBC:  Recent Labs Lab 05/29/16 1356 05/30/16 0427  WBC 13.9* 8.7  HGB 16.5 15.0  HCT 47.0 44.6  MCV 90.9 92.3  PLT 176 142*    Cardiac catheterization 01/29/2016: 1. Dist LAD lesion, 80% stenosed. 2. Mid RCA lesion, 60% stenosed. 3. Ost 2nd Diag to 2nd Diag lesion, 60% stenosed. 4. 2nd Mrg lesion, 30% stenosed. 5. Mid LAD lesion, 25% stenosed. 6. LPDA lesion, 45% stenosed.   Significant apical LAD stenosis, best treated with medication. Moderate disease in the second diagonal and mid nondominant RCA.  Normal left ventricular systolic  function. Normal left ventricular filling pressures.  Recommendations:   Medical therapy of both atrial fibrillation and coronary artery disease. Percutaneous intervention is not currently indicated for coronary disease.  Echocardiogram 01/29/2016: Study Conclusions  - Left ventricle: The cavity size was mildly dilated. Wall  thickness was increased in a pattern of mild LVH. Systolic  function was normal. The estimated ejection fraction was in the  range of 55% to 60%.   Medications:   Scheduled Medications: . atorvastatin  80 mg Oral q1800  . canagliflozin  300 mg Oral Q breakfast   And  . metFORMIN  1,000 mg Oral BID WC  . diltiazem  120 mg Oral Daily  . glimepiride  4 mg Oral Q breakfast   And  . glimepiride  2 mg Oral QAC supper  . insulin aspart  0-5 Units Subcutaneous QHS  . insulin aspart  0-9 Units Subcutaneous TID WC  . isosorbide mononitrate  30 mg Oral Daily  . metoprolol succinate  100 mg Oral Daily  . pantoprazole  40 mg Oral Daily  . potassium chloride  40 mEq Oral Daily  . rivaroxaban  20 mg Oral Q supper  .  sodium chloride flush  3 mL Intravenous Q12H     PRN Medications:  sodium chloride, acetaminophen, ondansetron (ZOFRAN) IV, sodium chloride flush   Assessment:   1. Symptomatic paroxysmal atrial flutter, CHADSVASC score of 2. Currently on Xarelto and metoprolol. Patient seen by Dr. Caryl Comes for EP consultation with recommendation to pursue ablation early next week.  2. CAD by cardiac catheterization in March of this year, most significant lesion in distal/apical LAD felt best managed medically with otherwise moderate disease as outlined above. LVEF normal range.  3. Essential hypertension, blood pressure control adequate at this time.  4. Obesity with OSA.   Plan/Discussion:    Reviewed chart and EP consultation per Dr. Caryl Comes from yesterday. Will check in with Dr. Curt Bears over the weekend to clarify timing and plan for ablation. Otherwise  continue Xarelto and metoprolol at current doses.   Satira Sark, M.D., F.A.C.C.

## 2016-05-31 NOTE — Progress Notes (Signed)
SUBJECTIVE: The patient is doing well today.  At this time, he denies chest pain, shortness of breath, or any new concerns.  [name] atorvastatin  80 mg Oral q1800  . canagliflozin  300 mg Oral Q breakfast   And  . metFORMIN  1,000 mg Oral BID WC  . diltiazem  120 mg Oral Daily  . glimepiride  4 mg Oral Q breakfast   And  . glimepiride  2 mg Oral QAC supper  . insulin aspart  0-5 Units Subcutaneous QHS  . insulin aspart  0-9 Units Subcutaneous TID WC  . isosorbide mononitrate  30 mg Oral Daily  . metoprolol succinate  100 mg Oral Daily  . pantoprazole  40 mg Oral Daily  . potassium chloride  40 mEq Oral Daily  . rivaroxaban  20 mg Oral Q supper  . sodium chloride flush  3 mL Intravenous Q12H      OBJECTIVE: Physical Exam: Filed Vitals:   05/30/16 0850 05/30/16 1334 05/30/16 2142 05/31/16 0526  BP: 125/63 106/51 108/53 143/78  Pulse: 57 55 55 51  Temp:  97.9 F (36.6 C) 98.1 F (36.7 C) 97.6 F (36.4 C)  TempSrc:  Oral Oral Oral  Resp:  18 17 18   Height:      Weight:      SpO2:  97% 96% 99%    Intake/Output Summary (Last 24 hours) at 05/31/16 [id number] Last data filed at 05/30/16 1745  Gross per 24 hour  Intake    720 ml  Output    600 ml  Net    120 ml    Telemetry reveals sinus rhythm  GEN- The patient is well appearing, alert and oriented x 3 today.   Head- normocephalic, atraumatic Eyes-  Sclera clear, conjunctiva pink Ears- hearing intact Oropharynx- clear Neck- supple, no JVP Lymph- no cervical lymphadenopathy Lungs- Clear to ausculation bilaterally, normal work of breathing Heart- Regular rate and rhythm, no murmurs, rubs or gallops, PMI not laterally displaced GI- soft, NT, ND, + BS Extremities- no clubbing, cyanosis, or edema Skin- no rash or lesion Psych- euthymic mood, full affect Neuro- strength and sensation are intact  LABS: Basic Metabolic Panel:  Recent Labs  05/30/16 1759 05/31/16 0239  NA 140 137  K 5.2* 4.6  CL 103 105  CO2 29 24    GLUCOSE 153* 82  BUN 15 14  CREATININE 1.11 0.88  CALCIUM 9.5 9.0  MG 1.9 1.8   Liver Function Tests:  Recent Labs  05/30/16 0427  AST 30  ALT 34  ALKPHOS 84  BILITOT 1.2  PROT 6.6  ALBUMIN 3.6   No results for input(s): LIPASE, AMYLASE in the last 72 hours. CBC:  Recent Labs  05/29/16 1356 05/30/16 0427  WBC 13.9* 8.7  HGB 16.5 15.0  HCT 47.0 44.6  MCV 90.9 92.3  PLT 176 142*   Cardiac Enzymes: No results for input(s): CKTOTAL, CKMB, CKMBINDEX, TROPONINI in the last 72 hours. BNP: Invalid input(s): POCBNP D-Dimer: No results for input(s): DDIMER in the last 72 hours. Hemoglobin A1C: No results for input(s): HGBA1C in the last 72 hours. Fasting Lipid Panel: No results for input(s): CHOL, HDL, LDLCALC, TRIG, CHOLHDL, LDLDIRECT in the last 72 hours. Thyroid Function Tests:  Recent Labs  05/29/16 1732  TSH 1.832   Anemia Panel: No results for input(s): VITAMINB12, FOLATE, FERRITIN, TIBC, IRON, RETICCTPCT in the last 72 hours.  RADIOLOGY: Dg Chest 2 View  05/29/2016  CLINICAL DATA:  Mid chest tightness, dizziness  and shortness of breath beginning at 8 a.m. this morning. EXAM: CHEST  2 VIEW COMPARISON:  PA and lateral chest 03/08/2016. FINDINGS: The lungs are clear. Heart size is normal. No pneumothorax or pleural effusion. Remote left rib fractures are noted. Defibrillator pad is in place. IMPRESSION: No acute disease. Electronically Signed   By: Inge Rise M.D.   On: 05/29/2016 16:08    ASSESSMENT AND PLAN:  Principal Problem:   Atrial fibrillation with rapid ventricular response (HCC) Active Problems:   CAD (coronary artery disease)   Hypertension   Sleep apnea   Obesity   Leukocytosis   AKI (acute kidney injury) ([place]) Spontaneously converted to sinus rhythm.  Appears to have been atrial flutter on ECG.  Kristin Lamagna plan for ablation Monday.  Discussed risks and benefits of the procedure.  Risks include bleeding, tamponade, heart block, and stroke.   The patient understands these risks and has agreed to the procedure.  He is on Xarelto which he Dartanyan Deasis need for one month after the procedure.  This patients CHA2DS2-VASc Score and unadjusted Ischemic Stroke Rate (% per year) is equal to 3.2 % stroke rate/year from a score of 3  Above score calculated as 1 point each if present [CHF, HTN, DM, Vascular=MI/PAD/Aortic Plaque, Age if 65-74, or Male] Above score calculated as 2 points each if present [Age > 75, or Stroke/TIA/TE]     Hiawatha Dressel Meredith Leeds, MD 05/31/2016 8:22 AM

## 2016-06-01 DIAGNOSIS — I2510 Atherosclerotic heart disease of native coronary artery without angina pectoris: Secondary | ICD-10-CM

## 2016-06-01 DIAGNOSIS — I483 Typical atrial flutter: Secondary | ICD-10-CM | POA: Diagnosis not present

## 2016-06-01 DIAGNOSIS — I10 Essential (primary) hypertension: Secondary | ICD-10-CM | POA: Diagnosis not present

## 2016-06-01 LAB — GLUCOSE, CAPILLARY
Glucose-Capillary: 84 mg/dL (ref 65–99)
Glucose-Capillary: 107 mg/dL — ABNORMAL HIGH (ref 65–99)
Glucose-Capillary: 151 mg/dL — ABNORMAL HIGH (ref 65–99)
Glucose-Capillary: 182 mg/dL — ABNORMAL HIGH (ref 65–99)

## 2016-06-01 LAB — CBC
HCT: 46.6 % (ref 39.0–52.0)
Hemoglobin: 15.8 g/dL (ref 13.0–17.0)
MCH: 31.1 pg (ref 26.0–34.0)
MCHC: 33.9 g/dL (ref 30.0–36.0)
MCV: 91.7 fL (ref 78.0–100.0)
Platelets: 164 10*3/uL (ref 150–400)
RBC: 5.08 MIL/uL (ref 4.22–5.81)
RDW: 13.6 % (ref 11.5–15.5)
WBC: 8.9 10*3/uL (ref 4.0–10.5)

## 2016-06-01 LAB — BASIC METABOLIC PANEL
Anion gap: 7 (ref 5–15)
BUN: 16 mg/dL (ref 6–20)
CO2: 28 mmol/L (ref 22–32)
Calcium: 9.5 mg/dL (ref 8.9–10.3)
Chloride: 103 mmol/L (ref 101–111)
Creatinine, Ser: 0.89 mg/dL (ref 0.61–1.24)
GFR calc Af Amer: >60 mL/min (ref >60)
GFR calc non Af Amer: >60 mL/min (ref >60)
Glucose, Bld: 95 mg/dL (ref 65–99)
Potassium: 4.4 mmol/L (ref 3.5–5.1)
Sodium: 138 mmol/L (ref 135–145)

## 2016-06-01 LAB — MAGNESIUM: Magnesium: 1.9 mg/dL (ref 1.7–2.4)

## 2016-06-01 MED ORDER — SODIUM CHLORIDE 0.9 % IV SOLN
INTRAVENOUS | Status: DC
  Administered 2016-06-02: 50 mL/h via INTRAVENOUS

## 2016-06-01 NOTE — Progress Notes (Signed)
Primary cardiologist: Dr. Carlyle Dolly  Seen for followup: CAD, atrial flutter  Subjective:    No chest pain, dizziness, or palpitations.  Objective:   Temp:  [97.8 F (36.6 C)-98.4 F (36.9 C)] 98.4 F (36.9 C) (07/23 0430) Pulse Rate:  [52-70] 52 (07/23 0430) Resp:  [17-20] 20 (07/23 0430) BP: (92-130)/(41-96) 130/70 (07/23 0430) SpO2:  [96 %-99 %] 96 % (07/23 0430)    Filed Weights   05/29/16 2259  Weight: 276 lb 14.4 oz (125.6 kg)    Intake/Output Summary (Last 24 hours) at 06/01/16 0743 Last data filed at 05/31/16 1513  Gross per 24 hour  Intake              480 ml  Output                0 ml  Net              480 ml    Telemetry: Sinus rhythm. No interval atrial flutter.  Exam:  General: Obese male in no distress.  Lungs: Decreased breath sounds but clear.  Cardiac: RRR without gallop.  Abdomen: NABS.  Extremities: Trace ankle edema.  Lab Results:  Basic Metabolic Panel:  Recent Labs Lab 05/30/16 1759 05/31/16 0239 06/01/16 0320  NA 140 137 138  K 5.2* 4.6 4.4  CL 103 105 103  CO2 29 24 28   GLUCOSE 153* 82 95  BUN 15 14 16   CREATININE 1.11 0.88 0.89  CALCIUM 9.5 9.0 9.5  MG 1.9 1.8 1.9    Liver Function Tests:  Recent Labs Lab 05/30/16 0427  AST 30  ALT 34  ALKPHOS 84  BILITOT 1.2  PROT 6.6  ALBUMIN 3.6    CBC:  Recent Labs Lab 05/29/16 1356 05/30/16 0427 06/01/16 0000  WBC 13.9* 8.7 8.9  HGB 16.5 15.0 15.8  HCT 47.0 44.6 46.6  MCV 90.9 92.3 91.7  PLT 176 142* 164    Cardiac catheterization 01/29/2016: 1. Dist LAD lesion, 80% stenosed. 2. Mid RCA lesion, 60% stenosed. 3. Ost 2nd Diag to 2nd Diag lesion, 60% stenosed. 4. 2nd Mrg lesion, 30% stenosed. 5. Mid LAD lesion, 25% stenosed. 6. LPDA lesion, 45% stenosed.   Significant apical LAD stenosis, best treated with medication. Moderate disease in the second diagonal and mid nondominant RCA.  Normal left ventricular systolic function. Normal left  ventricular filling pressures.  Recommendations:   Medical therapy of both atrial fibrillation and coronary artery disease. Percutaneous intervention is not currently indicated for coronary disease.  Echocardiogram 01/29/2016: Study Conclusions  - Left ventricle: The cavity size was mildly dilated. Wall  thickness was increased in a pattern of mild LVH. Systolic  function was normal. The estimated ejection fraction was in the  range of 55% to 60%.   Medications:   Scheduled Medications: . atorvastatin  80 mg Oral q1800  . canagliflozin  300 mg Oral Q breakfast   And  . metFORMIN  1,000 mg Oral BID WC  . diltiazem  120 mg Oral Daily  . glimepiride  4 mg Oral Q breakfast   And  . glimepiride  2 mg Oral QAC supper  . insulin aspart  0-5 Units Subcutaneous QHS  . insulin aspart  0-9 Units Subcutaneous TID WC  . isosorbide mononitrate  30 mg Oral Daily  . metoprolol succinate  100 mg Oral Daily  . pantoprazole  40 mg Oral Daily  . potassium chloride  40 mEq Oral Daily  . rivaroxaban  20  mg Oral Q supper  . sodium chloride flush  3 mL Intravenous Q12H    PRN Medications: sodium chloride, acetaminophen, ondansetron (ZOFRAN) IV, sodium chloride flush   Assessment:   1. Symptomatic paroxysmal atrial flutter, CHADSVASC score of 2. Currently on Xarelto and metoprolol. Patient seen by Dr. Caryl Comes for EP consultation with recommendation to pursue ablation early next week. Dr. Curt Bears plans on procedure for tomorrow afternoon.  2. CAD by cardiac catheterization in March of this year, most significant lesion in distal/apical LAD felt best managed medically with otherwise moderate disease as outlined above. LVEF normal range.  3. Essential hypertension, blood pressure control adequate at this time.  4. Obesity with OSA.   Plan/Discussion:    Dr. Curt Bears plans atrial flutter ablation for tomorrow afternoon. Continue Xarelto and metoprolol at current doses.   Satira Sark, M.D., F.A.C.C.

## 2016-06-02 ENCOUNTER — Encounter (HOSPITAL_COMMUNITY)
Admission: EM | Disposition: A | Discharge status: Home / Self Care | Payer: Self-pay | Source: Home / Self Care | Attending: Emergency Medicine

## 2016-06-02 DIAGNOSIS — I4892 Unspecified atrial flutter: Secondary | ICD-10-CM | POA: Diagnosis not present

## 2016-06-02 HISTORY — PX: ELECTROPHYSIOLOGIC STUDY: SHX172A

## 2016-06-02 LAB — GLUCOSE, CAPILLARY
Glucose-Capillary: 100 mg/dL — ABNORMAL HIGH (ref 65–99)
Glucose-Capillary: 74 mg/dL (ref 65–99)
Glucose-Capillary: 87 mg/dL (ref 65–99)
Glucose-Capillary: 149 mg/dL — ABNORMAL HIGH (ref 65–99)
Glucose-Capillary: 190 mg/dL — ABNORMAL HIGH (ref 65–99)
Glucose-Capillary: 180 mg/dL — ABNORMAL HIGH (ref 65–99)

## 2016-06-02 LAB — BASIC METABOLIC PANEL
Anion gap: 9 (ref 5–15)
BUN: 20 mg/dL (ref 6–20)
CO2: 26 mmol/L (ref 22–32)
Calcium: 9.5 mg/dL (ref 8.9–10.3)
Chloride: 103 mmol/L (ref 101–111)
Creatinine, Ser: 1.11 mg/dL (ref 0.61–1.24)
GFR calc Af Amer: >60 mL/min (ref >60)
GFR calc non Af Amer: >60 mL/min (ref >60)
Glucose, Bld: 128 mg/dL — ABNORMAL HIGH (ref 65–99)
Potassium: 5.6 mmol/L — ABNORMAL HIGH (ref 3.5–5.1)
Sodium: 138 mmol/L (ref 135–145)

## 2016-06-02 LAB — MAGNESIUM: Magnesium: 1.9 mg/dL (ref 1.7–2.4)

## 2016-06-02 LAB — CBC
HCT: 49.4 % (ref 39.0–52.0)
Hemoglobin: 16.9 g/dL (ref 13.0–17.0)
MCH: 31.4 pg (ref 26.0–34.0)
MCHC: 34.2 g/dL (ref 30.0–36.0)
MCV: 91.8 fL (ref 78.0–100.0)
Platelets: 170 10*3/uL (ref 150–400)
RBC: 5.38 MIL/uL (ref 4.22–5.81)
RDW: 13.6 % (ref 11.5–15.5)
WBC: 9.9 10*3/uL (ref 4.0–10.5)

## 2016-06-02 LAB — POTASSIUM: Potassium: 5.8 mmol/L — ABNORMAL HIGH (ref 3.5–5.1)

## 2016-06-02 SURGERY — A-FLUTTER/A-TACH/SVT ABLATION
Anesthesia: LOCAL

## 2016-06-02 MED ORDER — MIDAZOLAM HCL 5 MG/5ML IJ SOLN
INTRAMUSCULAR | Status: AC
  Filled 2016-06-02: qty 5

## 2016-06-02 MED ORDER — ACETAMINOPHEN 325 MG PO TABS
650.0000 mg | ORAL_TABLET | ORAL | Status: DC | PRN
  Administered 2016-06-02: 650 mg via ORAL

## 2016-06-02 MED ORDER — ONDANSETRON HCL 4 MG/2ML IJ SOLN: 4.0000 mg | Freq: Four times a day (QID) | INTRAMUSCULAR | Status: DC | PRN

## 2016-06-02 MED ORDER — SODIUM CHLORIDE 0.9 % IV SOLN: 250.0000 mL | INTRAVENOUS | Status: DC | PRN

## 2016-06-02 MED ORDER — HEPARIN (PORCINE) IN NACL 2-0.9 UNIT/ML-% IJ SOLN
INTRAMUSCULAR | Status: AC
  Filled 2016-06-02: qty 500

## 2016-06-02 MED ORDER — FENTANYL CITRATE (PF) 100 MCG/2ML IJ SOLN
INTRAMUSCULAR | Status: AC
  Filled 2016-06-02: qty 2

## 2016-06-02 MED ORDER — SODIUM CHLORIDE 0.9% FLUSH: 3.0000 mL | INTRAVENOUS | Status: DC | PRN

## 2016-06-02 MED ORDER — BUPIVACAINE HCL (PF) 0.25 % IJ SOLN
INTRAMUSCULAR | Status: AC
  Filled 2016-06-02: qty 60

## 2016-06-02 MED ORDER — BUPIVACAINE HCL (PF) 0.25 % IJ SOLN
INTRAMUSCULAR | Status: DC | PRN
  Administered 2016-06-02: 31 mL

## 2016-06-02 MED ORDER — SODIUM CHLORIDE 0.9% FLUSH: 3.0000 mL | Freq: Two times a day (BID) | INTRAVENOUS | Status: DC

## 2016-06-02 MED ORDER — MIDAZOLAM HCL 5 MG/5ML IJ SOLN
INTRAMUSCULAR | Status: DC | PRN
  Administered 2016-06-02 (×7): 1 mg via INTRAVENOUS

## 2016-06-02 MED ORDER — FENTANYL CITRATE (PF) 100 MCG/2ML IJ SOLN
INTRAMUSCULAR | Status: DC | PRN
  Administered 2016-06-02 (×8): 25 ug via INTRAVENOUS

## 2016-06-02 MED ORDER — HEPARIN (PORCINE) IN NACL 2-0.9 UNIT/ML-% IJ SOLN
INTRAMUSCULAR | Status: DC | PRN
  Administered 2016-06-02: 13:00:00

## 2016-06-02 SURGICAL SUPPLY — 13 items
BAG SNAP BAND KOVER 36X36 (MISCELLANEOUS) ×2 IMPLANT
CATH BLAZERPRIME XP (ABLATOR) ×1 IMPLANT
CATH JOSEPH QUAD ALLRED 6F REP (CATHETERS) ×1 IMPLANT
CATH WEB BIDIR CS D-F NONAUTO (CATHETERS) ×1 IMPLANT
HOVERMATT SINGLE USE (MISCELLANEOUS) ×1 IMPLANT
PACK EP LATEX FREE (CUSTOM PROCEDURE TRAY) ×2
PACK EP LF (CUSTOM PROCEDURE TRAY) ×1 IMPLANT
PAD DEFIB LIFELINK (PAD) ×2 IMPLANT
PATCH CARTO3 (PAD) ×1 IMPLANT
SHEATH PINNACLE 6F 10CM (SHEATH) ×1 IMPLANT
SHEATH PINNACLE 7F 10CM (SHEATH) ×1 IMPLANT
SHEATH PINNACLE 8F 10CM (SHEATH) ×1 IMPLANT
SHIELD RADPAD SCOOP 12X17 (MISCELLANEOUS) ×2 IMPLANT

## 2016-06-02 NOTE — Progress Notes (Signed)
SUBJECTIVE: The patient is doing well today.  At this time, he denies chest pain, shortness of breath, or any new concerns.  [name] atorvastatin  80 mg Oral q1800  . canagliflozin  300 mg Oral Q breakfast   And  . metFORMIN  1,000 mg Oral BID WC  . diltiazem  120 mg Oral Daily  . glimepiride  4 mg Oral Q breakfast   And  . glimepiride  2 mg Oral QAC supper  . insulin aspart  0-5 Units Subcutaneous QHS  . insulin aspart  0-9 Units Subcutaneous TID WC  . isosorbide mononitrate  30 mg Oral Daily  . metoprolol succinate  100 mg Oral Daily  . pantoprazole  40 mg Oral Daily  . potassium chloride  40 mEq Oral Daily  . rivaroxaban  20 mg Oral Q supper  . sodium chloride flush  3 mL Intravenous Q12H   . sodium chloride 50 mL/hr (06/02/16 0611)    OBJECTIVE: Physical Exam: Vitals:   06/01/16 0430 06/01/16 1533 06/01/16 2000 06/02/16 0403  BP: 130/70 129/73 118/66 121/65  Pulse: (!) 52 (!) 51 (!) 48 60  Resp: 20 18 19 20   Temp: 98.4 F (36.9 C) 97.5 F (36.4 C) 97.6 F (36.4 C) 98.6 F (37 C)  TempSrc: Oral Oral Oral Oral  SpO2: 96% 96% 97% 97%  Weight:      Height:        Intake/Output Summary (Last 24 hours) at 06/02/16 0759 Last data filed at 06/01/16 2100  Gross per 24 hour  Intake              730 ml  Output                0 ml  Net              730 ml    Telemetry reveals sinus rhythm  GEN- The patient is well appearing, alert and oriented x 3 today.   Head- normocephalic, atraumatic Eyes-  Sclera clear, conjunctiva pink Ears- hearing intact Oropharynx- clear Neck- supple, no JVP Lymph- no cervical lymphadenopathy Lungs- Clear to ausculation bilaterally, normal work of breathing Heart- Regular rate and rhythm, no murmurs, rubs or gallops, PMI not laterally displaced GI- soft, NT, ND, + BS Extremities- no clubbing, cyanosis, or edema Skin- no rash or lesion Psych- euthymic mood, full affect Neuro- strength and sensation are intact  LABS: Basic Metabolic  Panel:  Recent Labs  06/01/16 0320 06/02/16 0232  NA 138 138  K 4.4 5.8*  5.6*  CL 103 103  CO2 28 26  GLUCOSE 95 128*  BUN 16 20  CREATININE 0.89 1.11  CALCIUM 9.5 9.5  MG 1.9 1.9   Liver Function Tests: No results for input(s): AST, ALT, ALKPHOS, BILITOT, PROT, ALBUMIN in the last 72 hours. No results for input(s): LIPASE, AMYLASE in the last 72 hours. CBC:  Recent Labs  06/01/16 0000 06/02/16 0232  WBC 8.9 9.9  HGB 15.8 16.9  HCT 46.6 49.4  MCV 91.7 91.8  PLT 164 170   Cardiac Enzymes: No results for input(s): CKTOTAL, CKMB, CKMBINDEX, TROPONINI in the last 72 hours. BNP: Invalid input(s): POCBNP D-Dimer: No results for input(s): DDIMER in the last 72 hours. Hemoglobin A1C: No results for input(s): HGBA1C in the last 72 hours. Fasting Lipid Panel: No results for input(s): CHOL, HDL, LDLCALC, TRIG, CHOLHDL, LDLDIRECT in the last 72 hours. Thyroid Function Tests: No results for input(s): TSH, T4TOTAL, T3FREE, THYROIDAB in the  last 72 hours.  Invalid input(s): FREET3 Anemia Panel: No results for input(s): VITAMINB12, FOLATE, FERRITIN, TIBC, IRON, RETICCTPCT in the last 72 hours.  RADIOLOGY: Dg Chest 2 View  Result Date: 05/29/2016 CLINICAL DATA:  Mid chest tightness, dizziness and shortness of breath beginning at 8 a.m. this morning. EXAM: CHEST  2 VIEW COMPARISON:  PA and lateral chest 03/08/2016. FINDINGS: The lungs are clear. Heart size is normal. No pneumothorax or pleural effusion. Remote left rib fractures are noted. Defibrillator pad is in place. IMPRESSION: No acute disease. Electronically Signed   By: Inge Rise M.D.   On: 05/29/2016 16:08    ASSESSMENT AND PLAN:  Principal Problem:   Atrial fibrillation with rapid ventricular response (HCC) Active Problems:   CAD (coronary artery disease)   Hypertension   Sleep apnea   Obesity   Leukocytosis   AKI (acute kidney injury) ([place])   Typical atrial flutter ([place]) Done well without recurrence  since admission.  Plan for ablation today.  Risks and benefits discussed.  Risks include but are not limited to bleeding, tamponade, heart block, stroke.  Patient understands these risks and has agreed to the procedure.  This patients CHA2DS2-VASc Score and unadjusted Ischemic Stroke Rate (% per year) is equal to 3.2 % stroke rate/year from a score of 3  Above score calculated as 1 point each if present [CHF, HTN, DM, Vascular=MI/PAD/Aortic Plaque, Age if 65-74, or Male] Above score calculated as 2 points each if present [Age > 75, or Stroke/TIA/TE]  2. CAD by cardiac catheterization in March of this year, most significant lesion in distal/apical LAD felt best managed medically with otherwise moderate disease as outlined above. LVEF normal range.  3. Essential hypertension, blood pressure control adequate at this time.  4. Obesity with OSA.   Brienne Liguori Meredith Leeds, MD 06/02/2016 7:59 AM

## 2016-06-02 NOTE — Progress Notes (Signed)
Pt. Was placed on CPAP of 10cm H2O via nasal mask with 2L O2 bled in for a conscious sedation procedure in the CATH lab. Pt. Is tolerating CPAP well at this time without any complications.

## 2016-06-02 NOTE — Discharge Summary (Signed)
ELECTROPHYSIOLOGY PROCEDURE DISCHARGE SUMMARY    Patient ID: Lucas [name],  MRN: [id number], DOB/AGE: November 23, 1955 60 y.o.  Admit date: 05/29/2016 Discharge date: 06/03/2016  Primary Care Physician: Glenda Chroman, MD Primary Cardiologist: Branch  Primary Discharge Diagnosis:  Atrial flutter status post ablation this admission  Secondary Discharge Diagnosis:  1.  Diabetes 2.  HTN 3.  Hyperlipidemia 4.  OSA 5.  CAD  Allergies  Allergen Reactions  . Bee Venom Swelling  . Codeine Nausea Only     Procedures This Admission: 1.  Electrophysiology study and radiofrequency catheter ablation on 06/02/16 by Dr Curt Bears.  This study demonstrated typical atrial flutter with successful CTI ablation.  There were no inducible arrhythmias following ablation and no early apparent complications.   Brief HPI/Hospital Course: Lucas [name] is a 60 y.o. male with a past medical history as outlined above.  He presented to the ER on the day of admission with chest pain and palpitations and was found to be in atrial flutter with RVR.He was placed on amiodarone initially but seen by EP who recommended ablation as definitive therapy. Risks,benefits to ablation were reviewed with the patient who wished to proceed.  The patient underwent EPS/RFCA with details as outlined above.  He was monitored on telemetry overnight which demonstrated sinus rhythm.  Groin incision was without complication.  They were examined by Dr Curt Bears who considered them stable for discharge to home.  Follow up Ezekiah Massie be arranged in 4 weeks.  Wound care and restrictions were reviewed with the patient prior to discharge. Evelynne Spiers plan to continue Xarelto for 4 weeks post ablation. The patient has been counseled on lifestyle modification to hopefully prevent development of atrial fibrillation.   Physical Exam: Vitals:   06/02/16 1630 06/02/16 1700 06/02/16 1900 06/03/16 0443  BP: (!) 145/58 (!) 143/60 (!) 146/64 (!) 150/82  Pulse: 78 78 89  67  Resp:   20 20  Temp:   98.1 F (36.7 C) 98 F (36.7 C)  TempSrc:   Oral Oral  SpO2:   96% 94%  Weight:      Height:        GEN- The patient is obese appearing, alert and oriented x 3 today.   HEENT: normocephalic, atraumatic; sclera clear, conjunctiva pink; hearing intact; oropharynx clear; neck supple  Lungs- Clear to ausculation bilaterally, normal work of breathing.  No wheezes, rales, rhonchi Heart- Regular rate and rhythm, no murmurs, rubs or gallops  GI- soft, non-tender, non-distended, bowel sounds present  Extremities- no clubbing, cyanosis, or edema; DP/PT/radial pulses 2+ bilaterally, groin without hematoma/bruit MS- no significant deformity or atrophy Skin- warm and dry, no rash or lesion Psych- euthymic mood, full affect Neuro- strength and sensation are intact   Labs:   Lab Results  Component Value Date   WBC 9.9 06/02/2016   HGB 16.9 06/02/2016   HCT 49.4 06/02/2016   MCV 91.8 06/02/2016   PLT 170 06/02/2016    Recent Labs Lab 05/30/16 0427  06/02/16 0232  NA 140  < > 138  K 3.9  < > 5.8*  5.6*  CL 105  < > 103  CO2 28  < > 26  BUN 14  < > 20  CREATININE 1.03  < > 1.11  CALCIUM 9.0  < > 9.5  PROT 6.6  --   --   BILITOT 1.2  --   --   ALKPHOS 84  --   --   ALT 34  --   --  AST 30  --   --   GLUCOSE 100*  < > 128*  < > = values in this interval not displayed.  Discharge Medications:    Medication List    TAKE these medications   acetaminophen 500 MG tablet Commonly known as:  TYLENOL Take 500 mg by mouth every 6 (six) hours as needed for mild pain or moderate pain.   aspirin 81 MG tablet Take 81 mg by mouth daily.   atorvastatin 80 MG tablet Commonly known as:  LIPITOR Take 1 tablet (80 mg total) by mouth daily at 6 PM.   benazepril 20 MG tablet Commonly known as:  LOTENSIN Take 20 mg by mouth daily.   diltiazem 30 MG tablet Commonly known as:  CARDIZEM Take 1 tab 2 times daily as needed for palpitations   glimepiride 4 MG  tablet Commonly known as:  AMARYL Take 2-4 mg by mouth 2 (two) times daily. 4mg  in the morning and 2mg  in the evening   HYDROcodone-acetaminophen 5-325 MG tablet Commonly known as:  NORCO/VICODIN Take 1 tablet by mouth every 4 (four) hours as needed.   INVOKAMET [phone] MG Tabs Generic drug:  Canagliflozin-Metformin HCl Take 1 tablet by mouth 2 (two) times daily.   isosorbide mononitrate 30 MG 24 hr tablet Commonly known as:  IMDUR Take 1 tablet (30 mg total) by mouth daily.   metoprolol succinate 100 MG 24 hr tablet Commonly known as:  TOPROL-XL TAKE 1 TABLET BY MOUTH DAILY TAKE WITH OR IMMEDIATELY FOLLOWING A MEAL   pantoprazole 40 MG tablet Commonly known as:  PROTONIX Take 40 mg by mouth daily.   rivaroxaban 20 MG Tabs tablet Commonly known as:  XARELTO Take 1 tablet (20 mg total) by mouth daily with supper.       Disposition:  Discharge Instructions    Diet - low sodium heart healthy    Complete by:  As directed   Increase activity slowly    Complete by:  As directed     Follow-up Information    Ryin Schillo Meredith Leeds, MD Follow up on 06/30/2016.   Specialty:  Cardiology Why:  at 12Noon  Contact information: [place] [address] [phone]           Duration of Discharge Encounter: Greater than 30 minutes including physician time.  Signed, Chanetta Marshall, NP 06/03/2016 7:44 AM   I have seen and examined this patient with Chanetta Marshall.  Agree with above, note added to reflect my findings.  On exam, regular rhythm, no murmurs, lungs clear. Had ablation for atrial flutter after admission chest pain and found in flutter with RVR. Tolerated procedure well without complication.  Plan for discharge home on Xarelto.      Shawnice Tilmon M. Ikesha Siller MD 06/03/2016 1:31 PM

## 2016-06-03 ENCOUNTER — Encounter (HOSPITAL_COMMUNITY): Payer: Self-pay | Admitting: Cardiology

## 2016-06-03 ENCOUNTER — Encounter: Payer: Self-pay | Admitting: Nurse Practitioner

## 2016-06-03 DIAGNOSIS — I10 Essential (primary) hypertension: Secondary | ICD-10-CM | POA: Diagnosis not present

## 2016-06-03 DIAGNOSIS — E785 Hyperlipidemia, unspecified: Secondary | ICD-10-CM | POA: Diagnosis not present

## 2016-06-03 DIAGNOSIS — I483 Typical atrial flutter: Secondary | ICD-10-CM | POA: Diagnosis not present

## 2016-06-03 DIAGNOSIS — I480 Paroxysmal atrial fibrillation: Secondary | ICD-10-CM | POA: Diagnosis not present

## 2016-06-03 DIAGNOSIS — I4891 Unspecified atrial fibrillation: Secondary | ICD-10-CM | POA: Diagnosis not present

## 2016-06-03 LAB — GLUCOSE, CAPILLARY
Glucose-Capillary: 109 mg/dL — ABNORMAL HIGH (ref 65–99)
Glucose-Capillary: 105 mg/dL — ABNORMAL HIGH (ref 65–99)

## 2016-06-03 LAB — BASIC METABOLIC PANEL
Anion gap: 9 (ref 5–15)
BUN: 15 mg/dL (ref 6–20)
CO2: 26 mmol/L (ref 22–32)
Calcium: 9 mg/dL (ref 8.9–10.3)
Chloride: 101 mmol/L (ref 101–111)
Creatinine, Ser: 0.93 mg/dL (ref 0.61–1.24)
GFR calc Af Amer: >60 mL/min (ref >60)
GFR calc non Af Amer: >60 mL/min (ref >60)
Glucose, Bld: 122 mg/dL — ABNORMAL HIGH (ref 65–99)
Potassium: 4 mmol/L (ref 3.5–5.1)
Sodium: 136 mmol/L (ref 135–145)

## 2016-06-03 NOTE — Progress Notes (Addendum)
Patient in stable condition, this RN went over discharge instructions with patient , he vebalised understanding, patient belongings at bedside, iv removed, tele dc ccmd notified, patient taken off the unit by a hospital volunteer

## 2016-06-06 ENCOUNTER — Ambulatory Visit (HOSPITAL_COMMUNITY): Payer: 59 | Admitting: Nurse Practitioner

## 2016-06-09 ENCOUNTER — Telehealth: Payer: Self-pay | Admitting: Cardiology

## 2016-06-09 NOTE — Telephone Encounter (Signed)
Patient calling about needing a note to return to work tomorrow. Patient works as a [employer] forces operational officer, and his ablation was a week ago. Will forward to Dr. Curt Bears to see if patient can return to work, and does he need any restrictions.

## 2016-06-09 NOTE — Telephone Encounter (Signed)
New message    Pt calling to get a note to return to work on Tuesday. Please call.

## 2016-06-10 ENCOUNTER — Encounter: Payer: Self-pay | Admitting: *Deleted

## 2016-06-10 NOTE — Telephone Encounter (Signed)
F/U   Pt called again to get a doctor's note to return to work. Please call.

## 2016-06-10 NOTE — Telephone Encounter (Signed)
Ok to speak to wife per patient. Advised ok to return to work.  Note left at front desk for patient to pick up today so he may return to work tomorrow. Wife thanks me for helping.

## 2016-06-10 NOTE — Telephone Encounter (Signed)
Can we work on getting him a work note? Should be fine to return by now.

## 2016-06-11 ENCOUNTER — Telehealth: Payer: Self-pay | Admitting: Cardiology

## 2016-06-11 NOTE — Telephone Encounter (Signed)
Ok to speak w/ [facility]. Given information about procedure type and ICD 10 code. Follow up appt information given also. They will fax paperwork to be signed by Dr. Curt Bears.

## 2016-06-11 NOTE — Telephone Encounter (Signed)
New Message  Santiago Glad from [facility] is calling about a pt of ours about a Cardiac Ablation done on 7.24.  Santiago Glad also stated she's going to fax over a form for a release for this pt.  Fax#: [phone], Santiago Glad also stated it will go directly to her computer.

## 2016-06-13 ENCOUNTER — Encounter: Payer: Self-pay | Admitting: *Deleted

## 2016-06-14 ENCOUNTER — Emergency Department (HOSPITAL_COMMUNITY)
Admission: EM | Admit: 2016-06-14 | Discharge: 2016-06-14 | Disposition: A | Discharge status: Home / Self Care | Payer: 59 | Attending: Emergency Medicine | Admitting: Emergency Medicine

## 2016-06-14 ENCOUNTER — Telehealth: Payer: Self-pay | Admitting: Physician Assistant

## 2016-06-14 ENCOUNTER — Encounter (HOSPITAL_COMMUNITY): Payer: Self-pay | Admitting: Emergency Medicine

## 2016-06-14 ENCOUNTER — Emergency Department (HOSPITAL_COMMUNITY): Payer: 59

## 2016-06-14 DIAGNOSIS — Z79899 Other long term (current) drug therapy: Secondary | ICD-10-CM | POA: Diagnosis not present

## 2016-06-14 DIAGNOSIS — R52 Pain, unspecified: Secondary | ICD-10-CM

## 2016-06-14 DIAGNOSIS — E119 Type 2 diabetes mellitus without complications: Secondary | ICD-10-CM | POA: Insufficient documentation

## 2016-06-14 DIAGNOSIS — I10 Essential (primary) hypertension: Secondary | ICD-10-CM | POA: Insufficient documentation

## 2016-06-14 DIAGNOSIS — Z7984 Long term (current) use of oral hypoglycemic drugs: Secondary | ICD-10-CM | POA: Diagnosis not present

## 2016-06-14 DIAGNOSIS — Z7982 Long term (current) use of aspirin: Secondary | ICD-10-CM | POA: Insufficient documentation

## 2016-06-14 DIAGNOSIS — R1031 Right lower quadrant pain: Secondary | ICD-10-CM

## 2016-06-14 DIAGNOSIS — I2089 Other forms of angina pectoris: Secondary | ICD-10-CM

## 2016-06-14 DIAGNOSIS — I208 Other forms of angina pectoris: Secondary | ICD-10-CM

## 2016-06-14 DIAGNOSIS — I2510 Atherosclerotic heart disease of native coronary artery without angina pectoris: Secondary | ICD-10-CM | POA: Diagnosis not present

## 2016-06-14 DIAGNOSIS — Z7901 Long term (current) use of anticoagulants: Secondary | ICD-10-CM | POA: Insufficient documentation

## 2016-06-14 LAB — CBC
HCT: 44.8 % (ref 39.0–52.0)
Hemoglobin: 14.9 g/dL (ref 13.0–17.0)
MCH: 30.7 pg (ref 26.0–34.0)
MCHC: 33.3 g/dL (ref 30.0–36.0)
MCV: 92.4 fL (ref 78.0–100.0)
Platelets: 162 10*3/uL (ref 150–400)
RBC: 4.85 MIL/uL (ref 4.22–5.81)
RDW: 13.8 % (ref 11.5–15.5)
WBC: 9.1 10*3/uL (ref 4.0–10.5)

## 2016-06-14 LAB — I-STAT TROPONIN, ED
Troponin i, poc: 0.01 ng/mL (ref 0.00–0.08)
Troponin i, poc: 0.02 ng/mL (ref 0.00–0.08)
Troponin i, poc: 0.01 ng/mL (ref 0.00–0.08)

## 2016-06-14 LAB — BASIC METABOLIC PANEL
Anion gap: 9 (ref 5–15)
BUN: 14 mg/dL (ref 6–20)
CO2: 23 mmol/L (ref 22–32)
Calcium: 9.2 mg/dL (ref 8.9–10.3)
Chloride: 107 mmol/L (ref 101–111)
Creatinine, Ser: 1.04 mg/dL (ref 0.61–1.24)
GFR calc Af Amer: >60 mL/min (ref >60)
GFR calc non Af Amer: >60 mL/min (ref >60)
Glucose, Bld: 164 mg/dL — ABNORMAL HIGH (ref 65–99)
Potassium: 4.4 mmol/L (ref 3.5–5.1)
Sodium: 139 mmol/L (ref 135–145)

## 2016-06-14 MED ORDER — NITROGLYCERIN 0.4 MG SL SUBL: 0.4000 mg | SUBLINGUAL_TABLET | SUBLINGUAL | 0 refills | Status: DC | PRN

## 2016-06-14 NOTE — Discharge Instructions (Signed)
You will be called with an appointment time for your vascular ultrasound. Return if you are having any problems.

## 2016-06-14 NOTE — ED Notes (Signed)
Vascular US called by this RN to consult about pt's test but no answer.

## 2016-06-14 NOTE — Telephone Encounter (Signed)
    Patient called to report significant pain at his groin site where he had his ablation recently. Pain is 8/10 and radiating into hip. Discussed that mild tenderness is okay but not significant pain. I have recommended that he come into the ER to be evaluated to rule out pseudoaneurysm or significant bleeding. No dizziness or weakness.   Angelena Form PA-C  [facility]

## 2016-06-14 NOTE — ED Notes (Signed)
Pt verbalized understanding of d/c instructions and has no further questions. Pt stable, NAD. Pt instructed to come back in the morning after 0800 for VAS [place] ART study.

## 2016-06-14 NOTE — ED Provider Notes (Signed)
[facility] DEPT Provider Note   CSN: [id number] Arrival date & time: 06/14/16  1417  First Provider Contact:  First MD Initiated Contact with Patient 06/14/16 1823        History   Chief Complaint Chief Complaint  Patient presents with  . Groin Pain  . Shoulder Pain    HPI Lucas [name] is a 60 y.o. male.  The history is provided by the patient.  Groin Pain   Shoulder Pain    He has a history of coronary artery disease, diabetes, hypertension, hyperlipidemia., Paroxysmal atrial fibrillation status post ablation on July 24. Since then, he has been having intermittent pain in his right groin. It is present mostly when he is up and walking. He talked with the PA who is on-call for [place] cardiologist recommended that he come in to be evaluated for possible pseudoaneurysm. He states that pain is not present at rest. Also, while coming into the hospital, he had an episode of pain in his right shoulder which radiated to the neck and proximal right upper arm. This occurred when he got upset because and thought that his wife almost got into an accident. He had similar pain in his shoulder and neck when he was in the hospital with an episode of paroxysmal atrial fibrillation. There is no associated dyspnea, nausea, diaphoresis. The episode today, lasted approximately 15 minutes. This occurred while he was on the way to the hospital.  Past Medical History:  Diagnosis Date  . Arthritis   . CAD (coronary artery disease)    a. LHC on 01/29/16 which revealed significant apical LAD stenosis, best treated medically. There was moderate disease in the D2 and mid nondominant RCA.[name] No PCI performed   . Cataract   . Diabetes mellitus    type 2  . Hyperlipidemia   . Hypertension   . Obesity   . PAF (paroxysmal atrial fibrillation) ([place]) 01/2016  . Sleep apnea     Patient Active Problem List   Diagnosis Date Noted  . Typical atrial flutter ([place])   . Obesity 05/30/2016  . Leukocytosis 05/30/2016   . AKI (acute kidney injury) ([place]) 05/30/2016  . Diabetes mellitus, type II ([place]) 01/30/2016  . CAD (coronary artery disease)   . Hypertension   . Hyperlipidemia   . Sleep apnea   . Atrial fibrillation with rapid ventricular response ([place]) 01/29/2016  . Elevated troponin     Past Surgical History:  Procedure Laterality Date  . ANKLE SURGERY Left 2012   tendon repair  . CARDIAC CATHETERIZATION [name] 01/29/2016   Procedure: Left Heart Cath and Coronary Angiography;  Surgeon: [name], MD;  Location: [facility];  Service: Cardiovascular;  Laterality: [name];  . CATARACT EXTRACTION W/PHACO  11/11/2012   Procedure: CATARACT EXTRACTION PHACO AND INTRAOCULAR LENS PLACEMENT (IOC);  Surgeon: [name], MD;  Location: [facility];  Service: Ophthalmology;  Laterality: Right;  CDE:  5.56  . CATARACT EXTRACTION W/PHACO Left 12/15/2013   Procedure: CATARACT EXTRACTION PHACO AND INTRAOCULAR LENS PLACEMENT (IOC);  Surgeon: [name], MD;  Location: [facility];  Service: Ophthalmology;  Laterality: Left;  CDE:13.36  . CHOLECYSTECTOMY    . COLONOSCOPY    . CYSTOSCOPY W/ URETERAL STENT PLACEMENT     right  . ELECTROPHYSIOLOGIC STUDY [name] 06/02/2016   Procedure: A-Flutter Ablation;  Surgeon: [name], MD;  Location: [facility];  Service: Cardiovascular;  Laterality: [name];  . left knee sugery Left    Arthroscopy  .  NASAL SINUS SURGERY    . POLYPECTOMY    . TONSILLECTOMY         Home Medications    Prior to Admission medications   Medication Sig Start Date End Date Taking? Authorizing Provider  acetaminophen (TYLENOL) 500 MG tablet Take 500 mg by mouth every 6 (six) hours as needed for mild pain or moderate pain.    Historical Provider, MD  aspirin 81 MG tablet Take 81 mg by mouth daily.    Historical Provider, MD  atorvastatin (LIPITOR) 80 MG tablet Take 1 tablet (80 mg total) by mouth daily at 6 PM. 01/30/16   Eileen Stanford, PA-C  benazepril (LOTENSIN) 20 MG tablet Take 20 mg  by mouth daily.    Historical Provider, MD  Canagliflozin-Metformin HCl (INVOKAMET) [phone] MG TABS Take 1 tablet by mouth 2 (two) times daily.    Historical Provider, MD  diltiazem (CARDIZEM) 30 MG tablet Take 1 tab 2 times daily as needed for palpitations 03/27/16   Arnoldo Lenis, MD  glimepiride (AMARYL) 4 MG tablet Take 2-4 mg by mouth 2 (two) times daily. 4mg  in the morning and 2mg  in the evening 07/25/12   Historical Provider, MD  HYDROcodone-acetaminophen (NORCO/VICODIN) 5-325 MG tablet Take 1 tablet by mouth every 4 (four) hours as needed. 02/13/16   Dorie Rank, MD  isosorbide mononitrate (IMDUR) 30 MG 24 hr tablet Take 1 tablet (30 mg total) by mouth daily. 01/30/16   Eileen Stanford, PA-C  metoprolol succinate (TOPROL-XL) 100 MG 24 hr tablet TAKE 1 TABLET BY MOUTH DAILY TAKE WITH OR IMMEDIATELY FOLLOWING A MEAL 05/16/16   Arnoldo Lenis, MD  pantoprazole (PROTONIX) 40 MG tablet Take 40 mg by mouth daily.    Historical Provider, MD  rivaroxaban (XARELTO) 20 MG TABS tablet Take 1 tablet (20 mg total) by mouth daily with supper. 01/30/16   Eileen Stanford, PA-C    Family History Family History  Problem Relation Age of Onset  . Colon cancer Neg Hx   . Rectal cancer Neg Hx   . Stomach cancer Neg Hx     Social History Social History  Substance Use Topics  . Smoking status: Never Smoker  . Smokeless tobacco: Never Used  . Alcohol use 0.0 oz/week     Comment: rare occasion     Allergies   Bee venom and Codeine   Review of Systems Review of Systems  All other systems reviewed and are negative.    Physical Exam Updated Vital Signs BP 136/75   Pulse (!) 56   Temp 98 F (36.7 C) (Oral)   Resp 14   Ht 5\' 10"  (1.778 m)   Wt 280 lb 9 oz (127.3 kg)   SpO2 97%   BMI 40.26 kg/m   Physical Exam  Nursing note and vitals reviewed.  60 year old male, resting comfortably and in no acute distress. Vital signs are significant for borderline bradycardia. Oxygen saturation  is 97%, which is normal. Head is normocephalic and atraumatic. PERRLA, EOMI. Oropharynx is clear. Neck is nontender and supple without adenopathy or JVD. Back is nontender and there is no CVA tenderness. Lungs are clear without rales, wheezes, or rhonchi. Chest is nontender. Heart has regular rate and rhythm without murmur. Abdomen is soft, flat, nontender without masses or hepatosplenomegaly and peristalsis is normoactive. Extremities have no cyanosis or edema, full range of motion is present. There is no tenderness to palpation in the right groin. No ecchymosis or swelling is noted.  Right femoral pulse is 2+ and there is no tenderness with palpation in the area around the femoral artery. Skin is warm and dry without rash. Neurologic: Mental status is normal, cranial nerves are intact, there are no motor or sensory deficits.  ED Treatments / Results  Labs (all labs ordered are listed, but only abnormal results are displayed) Labs Reviewed  BASIC METABOLIC PANEL - Abnormal; Notable for the following:       Result Value   Glucose, Bld 164 (*)    All other components within normal limits  CBC  I-STAT TROPOININ, ED  I-STAT TROPOININ, ED  Randolm Idol, ED    EKG  EKG Interpretation  Date/Time:  Saturday June 14 2016 14:56:16 EDT Ventricular Rate:  65 PR Interval:  140 QRS Duration: 82 QT Interval:  410 QTC Calculation: 426 R Axis:   57 Text Interpretation:  Normal sinus rhythm Low voltage QRS Borderline ECG When compared with ECG of 06/03/2016, No significant change was found Confirmed by [facility]  MD, Lida Berkery ([id number]) on 06/14/2016 5:37:17 PM       Radiology Dg Chest 2 View  Result Date: 06/14/2016 CLINICAL DATA:  Heart catheterization 2 weeks ago for cardiac ablation; Since then has had right sided neck and right shoulder pain along with right groin pain; HTN meds; Diabetes; EXAM: CHEST  2 VIEW COMPARISON:  None. FINDINGS: Normal mediastinum and cardiac silhouette. Normal  pulmonary vasculature. No evidence of effusion, infiltrate, or pneumothorax. No acute bony abnormality. Degenerative osteophytosis of the thoracic spine. IMPRESSION: No acute cardiopulmonary process. Electronically Signed   By: Suzy Bouchard M.D.   On: 06/14/2016 16:02    Procedures Procedures (including critical care time)  Medications Ordered in ED Medications - No data to display   Initial Impression / Assessment and Plan / ED Course  I have reviewed the triage vital signs and the nursing notes.  Pertinent labs & imaging results that were available during my care of the patient were reviewed by me and considered in my medical decision making (see chart for details).  Clinical Course    Right groin pain of of uncertain cause. I doubt this is related to any complications of his cardiac ablation. However, vascular ultrasound will be obtained to rule out pseudoaneurysm. Shoulder pain appears to be his angina equivalent. Review of past records shows cardiac catheterization in March demonstrated an 80% lesion in the distal LAD which was not amenable to stent placement. Initial ECG shows no acute changes and initial troponin is normal. He will be kept in the ED for a 3 hour and 6 hour troponins.  Repeat troponins showed no rising level indicating no cardiac injury. Vascular service was not able to do the ultrasound today, so he will be brought back tomorrow. He is discharged with prescription for nitroglycerin to use if he has his shoulder pain.  Final Clinical Impressions(s) / ED Diagnoses   Final diagnoses:  Anginal equivalent ([place])  Pain in the groin, right    New Prescriptions New Prescriptions   No medications on file     [name], MD [id number]

## 2016-06-14 NOTE — ED Triage Notes (Signed)
Pt had an ablation on 7/24-- went through right groin area-- pt has pain in area with walking and if standing on for any length of time. Also c/o right shoulder and neck pain.

## 2016-06-15 ENCOUNTER — Ambulatory Visit (HOSPITAL_COMMUNITY)
Admission: RE | Admit: 2016-06-15 | Discharge: 2016-06-15 | Disposition: A | Discharge status: Home / Self Care | Payer: 59 | Source: Ambulatory Visit | Attending: Emergency Medicine | Admitting: Emergency Medicine

## 2016-06-15 DIAGNOSIS — R52 Pain, unspecified: Secondary | ICD-10-CM | POA: Diagnosis present

## 2016-06-15 NOTE — Progress Notes (Signed)
VASCULAR LAB PRELIMINARY  PRELIMINARY  PRELIMINARY  PRELIMINARY  Right groin ultrasound completed.    Preliminary report:  There is no obvious evidence of pseudoaneurysm, AV fistula, or hematoma noted in the right groin.  Dannon Perlow, RVT 06/15/2016, 9:15 AM

## 2016-06-29 NOTE — Progress Notes (Signed)
Electrophysiology Office Note   Date:  06/30/2016   ID:  Lucas [name], DOB [birth year], MRN [id number]  PCP:  Glenda Chroman, MD  Cardiologist:  Branch Primary Electrophysiologist:  Aly Seidenberg Meredith Leeds, MD    Chief Complaint  Patient presents with  . Follow-up    1 month post Tikosyn     History of Present Illness: Lucas [name] is a 60 y.o. male who presents today for electrophysiology evaluation.   Hx atrial flutter s/p ablation, DM, HTN,HLD, OSA, CAD. Since that time, he has not noticed palpitations. He has had some chest pain that has been relieved with nitroglycerin. He says that he has had to take 4 nitroglycerin tabs since his discharge from the hospital. Each time he takes the nitroglycerin, his chest pain is improved.   Today, he denies symptoms of palpitations, shortness of breath, orthopnea, PND, lower extremity edema, claudication, dizziness, presyncope, syncope, bleeding, or neurologic sequela. The patient is tolerating medications without difficulties and is otherwise without complaint today.    Past Medical History:  Diagnosis Date  . Arthritis   . CAD (coronary artery disease)    a. LHC on 01/29/16 which revealed significant apical LAD stenosis, best treated medically. There was moderate disease in the D2 and mid nondominant RCA.[name] No PCI performed   . Cataract   . Diabetes mellitus    type 2  . Hyperlipidemia   . Hypertension   . Obesity   . PAF (paroxysmal atrial fibrillation) ([place]) 01/2016  . Sleep apnea    Past Surgical History:  Procedure Laterality Date  . ANKLE SURGERY Left 2012   tendon repair  . CARDIAC CATHETERIZATION [name] 01/29/2016   Procedure: Left Heart Cath and Coronary Angiography;  Surgeon: [name], MD;  Location: [facility];  Service: Cardiovascular;  Laterality: [name];  . CATARACT EXTRACTION W/PHACO  11/11/2012   Procedure: CATARACT EXTRACTION PHACO AND INTRAOCULAR LENS PLACEMENT (IOC);  Surgeon: [name], MD;  Location: [facility];   Service: Ophthalmology;  Laterality: Right;  CDE:  5.56  . CATARACT EXTRACTION W/PHACO Left 12/15/2013   Procedure: CATARACT EXTRACTION PHACO AND INTRAOCULAR LENS PLACEMENT (IOC);  Surgeon: [name], MD;  Location: [facility];  Service: Ophthalmology;  Laterality: Left;  CDE:13.36  . CHOLECYSTECTOMY    . COLONOSCOPY    . CYSTOSCOPY W/ URETERAL STENT PLACEMENT     right  . ELECTROPHYSIOLOGIC STUDY [name] 06/02/2016   Procedure: A-Flutter Ablation;  Surgeon: Maysie Parkhill Meredith Leeds, MD;  Location: [facility];  Service: Cardiovascular;  Laterality: [name];  . left knee sugery Left    Arthroscopy  . NASAL SINUS SURGERY    . POLYPECTOMY    . TONSILLECTOMY       Current Outpatient Prescriptions  Medication Sig Dispense Refill  . acetaminophen (TYLENOL) 500 MG tablet Take 500 mg by mouth every 6 (six) hours as needed for mild pain or moderate pain.    [name] aspirin 81 MG tablet Take 81 mg by mouth daily.    [name] atorvastatin (LIPITOR) 80 MG tablet Take 1 tablet (80 mg total) by mouth daily at 6 PM. 90 tablet 3  . benazepril (LOTENSIN) 20 MG tablet Take 20 mg by mouth daily.    . Canagliflozin-Metformin HCl (INVOKAMET) [phone] MG TABS Take 1 tablet by mouth 2 (two) times daily.    [name] diltiazem (CARDIZEM) 30 MG tablet Take 1 tab 2 times daily as needed for palpitations 180 tablet 3  . glimepiride (AMARYL) 4 MG  tablet Take 2-4 mg by mouth 2 (two) times daily. 4mg  in the morning and 2mg  in the evening    . isosorbide mononitrate (IMDUR) 60 MG 24 hr tablet Take 1 tablet (60 mg total) by mouth daily. 90 tablet 3  . metoprolol succinate (TOPROL-XL) 100 MG 24 hr tablet TAKE 1 TABLET BY MOUTH DAILY TAKE WITH OR IMMEDIATELY FOLLOWING A MEAL 60 tablet 6  . nitroGLYCERIN (NITROSTAT) 0.4 MG SL tablet Place 1 tablet (0.4 mg total) under the tongue every 5 (five) minutes as needed for chest pain. 30 tablet 0  . pantoprazole (PROTONIX) 40 MG tablet Take 40 mg by mouth daily.    . rivaroxaban (XARELTO) 20 MG TABS tablet  Take 1 tablet (20 mg total) by mouth daily with supper. 30 tablet 11   No current facility-administered medications for this visit.     Allergies:   Bee venom and Codeine   Social History:  The patient  reports that he has never smoked. He has never used smokeless tobacco. He reports that he drinks alcohol. He reports that he does not use drugs.   Family History:  The patient's family history includes Aneurysm in his maternal grandmother and mother; Cirrhosis in his father; Diabetes in his sister; Heart attack in his paternal grandmother and sister; Heart murmur in his mother; Hypertension in his mother; Stroke in his maternal grandmother and sister.    ROS:  Please see the history of present illness.   Otherwise, review of systems is positive for chest pain, back pain, muscle pain.   All other systems are reviewed and negative.    PHYSICAL EXAM: VS:  BP 110/66   Pulse 63   Ht 5\' 10"  (1.778 m)   Wt 282 lb 3.2 oz (128 kg)   BMI 40.49 kg/m  , BMI Body mass index is 40.49 kg/m. GEN: Well nourished, well developed, in no acute distress  HEENT: normal  Neck: no JVD, carotid bruits, or masses Cardiac: RRR; no murmurs, rubs, or gallops,no edema  Respiratory:  clear to auscultation bilaterally, normal work of breathing GI: soft, nontender, nondistended, + BS MS: no deformity or atrophy  Skin: warm and dry Neuro:  Strength and sensation are intact Psych: euthymic mood, full affect  EKG:  EKG is ordered today. Personal review of the ekg ordered shows sinus rhythm, rate 63, low voltage  Recent Labs: 01/29/2016: B Natriuretic Peptide 184.3 05/29/2016: TSH 1.832 05/30/2016: ALT 34 06/02/2016: Magnesium 1.9 06/14/2016: BUN 14; Creatinine, Ser 1.04; Hemoglobin 14.9; Platelets 162; Potassium 4.4; Sodium 139    Lipid Panel     Component Value Date/Time   CHOL 195 01/29/2016 0109   TRIG 133 01/29/2016 0109   HDL 39 (L) 01/29/2016 0109   CHOLHDL 5.0 01/29/2016 0109   VLDL 27 01/29/2016  0109   LDLCALC 129 (H) 01/29/2016 0109     Wt Readings from Last 3 Encounters:  06/30/16 282 lb 3.2 oz (128 kg)  06/14/16 280 lb 9 oz (127.3 kg)  05/29/16 276 lb 14.4 oz (125.6 kg)      Other studies Reviewed: Additional studies/ records that were reviewed today include: TTE 01/29/16  Review of the above records today demonstrates:  - Left ventricle: The cavity size was mildly dilated. Wall   thickness was increased in a pattern of mild LVH. Systolic   function was normal. The estimated ejection fraction was in the   range of 55% to 60%.   01/29/16 Cath 1. Dist LAD lesion, 80% stenosed. 2.  Mid RCA lesion, 60% stenosed. 3. Ost 2nd Diag to 2nd Diag lesion, 60% stenosed. 4. 2nd Mrg lesion, 30% stenosed. 5. Mid LAD lesion, 25% stenosed. 6. LPDA lesion, 45% stenosed.  ASSESSMENT AND PLAN:  1.  Atrial flutter: s/p ablation on Xarelto. Doing well without any major complaints. We'll continue medical management.  This patients CHA2DS2-VASc Score and unadjusted Ischemic Stroke Rate (% per year) is equal to 3.2 % stroke rate/year from a score of 3  Above score calculated as 1 point each if present [CHF, HTN, DM, Vascular=MI/PAD/Aortic Plaque, Age if 65-74, or Male] Above score calculated as 2 points each if present [Age > 75, or Stroke/TIA/TE]   2. Hypertension: well controlled  3. Hyperlipidemia: on statin  4. CAD: Currently having occasional chest pain with known coronary disease thought to be best with medical management. We'll increase his Imdur to 60 mg.  5. Paroxysmal atrial fibrillation: No further episodes of atrial fibrillation. We'll continue his anticoagulation with the Xarelto.  This patients CHA2DS2-VASc Score and unadjusted Ischemic Stroke Rate (% per year) is equal to 3.2 % stroke rate/year from a score of 3  Above score calculated as 1 point each if present [CHF, HTN, DM, Vascular=MI/PAD/Aortic Plaque, Age if 65-74, or Male] Above score calculated as 2  points each if present [Age > 75, or Stroke/TIA/TE]     Current medicines are reviewed at length with the patient today.   The patient does not have concerns regarding his medicines.  The following changes were made today:  imdur 60 mg  Labs/ tests ordered today include:  Orders Placed This Encounter  Procedures  . EKG 12-Lead     Disposition:   FU with Tamee Battin 6 months  Signed, Roslin Norwood Meredith Leeds, MD  06/30/2016 12:35 PM     [address] [place] [address] [phone] (office) [phone] (fax)

## 2016-06-30 ENCOUNTER — Ambulatory Visit (INDEPENDENT_AMBULATORY_CARE_PROVIDER_SITE_OTHER): Payer: 59 | Admitting: Cardiology

## 2016-06-30 ENCOUNTER — Encounter: Payer: Self-pay | Admitting: Cardiology

## 2016-06-30 VITALS — BP 110/66 | HR 63 | Ht 70.0 in | Wt 282.2 lb

## 2016-06-30 DIAGNOSIS — I4892 Unspecified atrial flutter: Secondary | ICD-10-CM | POA: Diagnosis not present

## 2016-06-30 MED ORDER — ISOSORBIDE MONONITRATE ER 60 MG PO TB24: 60.0000 mg | ORAL_TABLET | Freq: Every day | ORAL | 3 refills | Status: DC

## 2016-06-30 NOTE — Patient Instructions (Addendum)
Medication Instructions:  Your physician has recommended you make the following change in your medication: 1) INCREASE Imdur to 60 mg daily  Labwork: None ordered  Testing/Procedures: None ordered  Follow-Up: Your physician wants you to follow-up in: 6 months with Dr. Curt Bears. You will receive a reminder letter in the mail two months in advance. If you don't receive a letter, please call our office to schedule the follow-up appointment.   If you need a refill on your cardiac medications before your next appointment, please call your pharmacy.  Thank you for choosing [facility]!!   Trinidad Curet, RN [phone]

## 2016-07-03 ENCOUNTER — Encounter: Payer: Self-pay | Admitting: Cardiology

## 2016-07-03 ENCOUNTER — Ambulatory Visit (INDEPENDENT_AMBULATORY_CARE_PROVIDER_SITE_OTHER): Payer: 59 | Admitting: Cardiology

## 2016-07-03 VITALS — BP 116/73 | HR 55 | Ht 70.0 in | Wt 282.8 lb

## 2016-07-03 DIAGNOSIS — I10 Essential (primary) hypertension: Secondary | ICD-10-CM

## 2016-07-03 DIAGNOSIS — I2510 Atherosclerotic heart disease of native coronary artery without angina pectoris: Secondary | ICD-10-CM

## 2016-07-03 DIAGNOSIS — I4892 Unspecified atrial flutter: Secondary | ICD-10-CM

## 2016-07-03 NOTE — Patient Instructions (Signed)
Your physician recommends that you schedule a follow-up appointment in: 4 months with Dr. Branch   Your physician recommends that you continue on your current medications as directed. Please refer to the Current Medication list given to you today.  Thank you for choosing [facility]!!    

## 2016-07-03 NOTE — Progress Notes (Signed)
Clinical Summary Lucas [name] is a 60 y.o.male seen today for follow up of the following medical problems.   1. Aflutter - CHADS2Vasc score of 2, started on xarelto - s/p aflutter ablation 05/2016 by Dr Curt Bears.  - no recent palpitations - compliant with meds  2. CAD - previous chest pain episode occurred in the setting of afib with RVR - cath 01/29/16 with apical LAD disease, treated medically. Otherwise diffuse mild to moderate disease -no recent chest pain  - imdur increased to 60mg  during recent EP visit with reported symptoms of chest pain.  - chest pain has resolved over the last few days.    3. HTN - compliant with meds  4. OSA - followed Dr Luan Pulling.      Past Medical History:  Diagnosis Date  . Arthritis   . CAD (coronary artery disease)    a. LHC on 01/29/16 which revealed significant apical LAD stenosis, best treated medically. There was moderate disease in the D2 and mid nondominant RCA.[name] No PCI performed   . Cataract   . Diabetes mellitus    type 2  . Hyperlipidemia   . Hypertension   . Obesity   . PAF (paroxysmal atrial fibrillation) ([place]) 01/2016  . Sleep apnea      Allergies  Allergen Reactions  . Bee Venom Swelling  . Codeine Nausea Only     Current Outpatient Prescriptions  Medication Sig Dispense Refill  . acetaminophen (TYLENOL) 500 MG tablet Take 500 mg by mouth every 6 (six) hours as needed for mild pain or moderate pain.    [name] aspirin 81 MG tablet Take 81 mg by mouth daily.    [name] atorvastatin (LIPITOR) 80 MG tablet Take 1 tablet (80 mg total) by mouth daily at 6 PM. 90 tablet 3  . benazepril (LOTENSIN) 20 MG tablet Take 20 mg by mouth daily.    . Canagliflozin-Metformin HCl (INVOKAMET) [phone] MG TABS Take 1 tablet by mouth 2 (two) times daily.    [name] diltiazem (CARDIZEM) 30 MG tablet Take 1 tab 2 times daily as needed for palpitations 180 tablet 3  . glimepiride (AMARYL) 4 MG tablet Take 2-4 mg by mouth 2 (two) times daily. 4mg  in the  morning and 2mg  in the evening    . isosorbide mononitrate (IMDUR) 60 MG 24 hr tablet Take 1 tablet (60 mg total) by mouth daily. 90 tablet 3  . metoprolol succinate (TOPROL-XL) 100 MG 24 hr tablet TAKE 1 TABLET BY MOUTH DAILY TAKE WITH OR IMMEDIATELY FOLLOWING A MEAL 60 tablet 6  . nitroGLYCERIN (NITROSTAT) 0.4 MG SL tablet Place 1 tablet (0.4 mg total) under the tongue every 5 (five) minutes as needed for chest pain. 30 tablet 0  . pantoprazole (PROTONIX) 40 MG tablet Take 40 mg by mouth daily.    . rivaroxaban (XARELTO) 20 MG TABS tablet Take 1 tablet (20 mg total) by mouth daily with supper. 30 tablet 11   No current facility-administered medications for this visit.      Past Surgical History:  Procedure Laterality Date  . ANKLE SURGERY Left 2012   tendon repair  . CARDIAC CATHETERIZATION [name] 01/29/2016   Procedure: Left Heart Cath and Coronary Angiography;  Surgeon: [name], MD;  Location: [facility];  Service: Cardiovascular;  Laterality: [name];  . CATARACT EXTRACTION W/PHACO  11/11/2012   Procedure: CATARACT EXTRACTION PHACO AND INTRAOCULAR LENS PLACEMENT (IOC);  Surgeon: Tonny Ileana Chalupa, MD;  Location: [facility];  Service: Ophthalmology;  Laterality: Right;  CDE:  5.56  . CATARACT EXTRACTION W/PHACO Left 12/15/2013   Procedure: CATARACT EXTRACTION PHACO AND INTRAOCULAR LENS PLACEMENT (IOC);  Surgeon: Tonny Delitha Elms, MD;  Location: [facility];  Service: Ophthalmology;  Laterality: Left;  CDE:13.36  . CHOLECYSTECTOMY    . COLONOSCOPY    . CYSTOSCOPY W/ URETERAL STENT PLACEMENT     right  . ELECTROPHYSIOLOGIC STUDY [name] 06/02/2016   Procedure: A-Flutter Ablation;  Surgeon: [name], MD;  Location: [facility];  Service: Cardiovascular;  Laterality: [name];  . left knee sugery Left    Arthroscopy  . NASAL SINUS SURGERY    . POLYPECTOMY    . TONSILLECTOMY       Allergies  Allergen Reactions  . Bee Venom Swelling  . Codeine Nausea Only      Family History  Problem  Relation Age of Onset  . Heart murmur Mother   . Aneurysm Mother   . Hypertension Mother   . Cirrhosis Father   . Heart attack Sister   . Diabetes Sister   . Aneurysm Maternal Grandmother   . Stroke Maternal Grandmother   . Heart attack Paternal Grandmother   . Stroke Sister   . Colon cancer Neg Hx   . Rectal cancer Neg Hx   . Stomach cancer Neg Hx      Social History Mr. Cowdrey reports that he has never smoked. He has never used smokeless tobacco. Mr. Abler reports that he drinks alcohol.   Review of Systems CONSTITUTIONAL: No weight loss, fever, chills, weakness or fatigue.  HEENT: Eyes: No visual loss, blurred vision, double vision or yellow sclerae.No hearing loss, sneezing, congestion, runny nose or sore throat.  SKIN: No rash or itching.  CARDIOVASCULAR: per HPI RESPIRATORY: No shortness of breath, cough or sputum.  GASTROINTESTINAL: No anorexia, nausea, vomiting or diarrhea. No abdominal pain or blood.  GENITOURINARY: No burning on urination, no polyuria NEUROLOGICAL: No headache, dizziness, syncope, paralysis, ataxia, numbness or tingling in the extremities. No change in bowel or bladder control.  MUSCULOSKELETAL: No muscle, back pain, joint pain or stiffness.  LYMPHATICS: No enlarged nodes. No history of splenectomy.  PSYCHIATRIC: No history of depression or anxiety.  ENDOCRINOLOGIC: No reports of sweating, cold or heat intolerance. No polyuria or polydipsia.  [name]   Physical Examination Vitals:   07/03/16 1306  BP: 116/73  Pulse: (!) 55   Vitals:   07/03/16 1306  Weight: 282 lb 12.8 oz (128.3 kg)  Height: 5\' 10"  (1.778 m)    Gen: resting comfortably, no acute distress HEENT: no scleral icterus, pupils equal round and reactive, no palptable cervical adenopathy,  CV: RRR, no m/r/g, no jvd Resp: Clear to auscultation bilaterally GI: abdomen is soft, non-tender, non-distended, normal bowel sounds, no hepatosplenomegaly MSK: extremities are warm, no edema.    Skin: warm, no rash Neuro:  no focal deficits Psych: appropriate affect   Diagnostic Studies 01/2016 echo Study Conclusions  - Left ventricle: The cavity size was mildly dilated. Wall  thickness was increased in a pattern of mild LVH. Systolic  function was normal. The estimated ejection fraction was in the  range of 55% to 60%.  01/2016 cath 1. Dist LAD lesion, 80% stenosed. 2. Mid RCA lesion, 60% stenosed. 3. Ost 2nd Diag to 2nd Diag lesion, 60% stenosed. 4. 2nd Mrg lesion, 30% stenosed. 5. Mid LAD lesion, 25% stenosed. 6. LPDA lesion, 45% stenosed.   Significant apical LAD stenosis, best treated with medication. Moderate disease in the second  diagonal and mid nondominant RCA.  Normal left ventricular systolic function. Normal left ventricular filling pressures.  Post Cath Recommendations:   Medical therapy of both atrial fibrillation and coronary artery disease. Percutaneous intervention is not currently indicated for coronary disease.     Assessment and Plan   1. Aflutter - s/p ablation, doing well with no significant symptoms - continue current meds - CHADS2Vasc score is 2, continue xarelto.   2. CAD - cath with distal apical disease, otherwise moderate disease - recent chest pain symptoms resolved with increased nitrates, we will continue current meds  3. HTN - at goal, he will continue current meds   4. OSA - per Dr Luan Pulling   F/u 4 months  Arnoldo Lenis, M.D.

## 2016-10-27 ENCOUNTER — Ambulatory Visit (INDEPENDENT_AMBULATORY_CARE_PROVIDER_SITE_OTHER): Payer: 59 | Admitting: Cardiology

## 2016-10-27 ENCOUNTER — Encounter: Payer: Self-pay | Admitting: Cardiology

## 2016-10-27 ENCOUNTER — Encounter: Payer: Self-pay | Admitting: *Deleted

## 2016-10-27 VITALS — BP 139/76 | HR 60 | Ht 70.0 in | Wt 277.0 lb

## 2016-10-27 DIAGNOSIS — I4892 Unspecified atrial flutter: Secondary | ICD-10-CM

## 2016-10-27 DIAGNOSIS — I2510 Atherosclerotic heart disease of native coronary artery without angina pectoris: Secondary | ICD-10-CM

## 2016-10-27 DIAGNOSIS — I10 Essential (primary) hypertension: Secondary | ICD-10-CM | POA: Diagnosis not present

## 2016-10-27 DIAGNOSIS — R0789 Other chest pain: Secondary | ICD-10-CM | POA: Diagnosis not present

## 2016-10-27 NOTE — Progress Notes (Signed)
Clinical Summary Mr. Ugarte is a 60 y.o.male seen today for follow up of the following medical problems.   1. Aflutter - CHADS2Vasc score of 2, on xarelto - s/p aflutter ablation 05/2016 by Dr Curt Bears.   - no recent palpitations. No bleeding issues on xarelto.   2. CAD - previous chest pain episode occurred in the setting of afib with RVR - cath 01/29/16 with apical LAD disease, treated medically. Otherwise diffuse mild to moderate disease  - imdur increased to 60mg  during recent EP visit with reported symptoms of chest pain.  - occasional nonspecific chest pain midchest. Often comes on with stress. Works at [employer], walks about 0.8 miles to work which can also trigger symptoms.    3. HTN - compliant with meds - checks occasionally at home, 140/80s  4. OSA - followed Dr Luan Pulling.     Past Medical History:  Diagnosis Date  . Arthritis   . CAD (coronary artery disease)    a. LHC on 01/29/16 which revealed significant apical LAD stenosis, best treated medically. There was moderate disease in the D2 and mid nondominant RCA.[name] No PCI performed   . Cataract   . Diabetes mellitus    type 2  . Hyperlipidemia   . Hypertension   . Obesity   . PAF (paroxysmal atrial fibrillation) ([place]) 01/2016  . Sleep apnea      Allergies  Allergen Reactions  . Bee Venom Swelling  . Codeine Nausea Only     Current Outpatient Prescriptions  Medication Sig Dispense Refill  . acetaminophen (TYLENOL) 500 MG tablet Take 500 mg by mouth every 6 (six) hours as needed for mild pain or moderate pain.    [name] aspirin 81 MG tablet Take 81 mg by mouth daily.    [name] atorvastatin (LIPITOR) 80 MG tablet Take 1 tablet (80 mg total) by mouth daily at 6 PM. 90 tablet 3  . benazepril (LOTENSIN) 20 MG tablet Take 20 mg by mouth daily.    . Canagliflozin-Metformin HCl (INVOKAMET) [phone] MG TABS Take 1 tablet by mouth 2 (two) times daily.    [name] diltiazem (CARDIZEM) 30 MG tablet Take 1 tab 2  times daily as needed for palpitations 180 tablet 3  . glimepiride (AMARYL) 4 MG tablet Take 2-4 mg by mouth 2 (two) times daily. 4mg  in the morning and 2mg  in the evening    . isosorbide mononitrate (IMDUR) 60 MG 24 hr tablet Take 1 tablet (60 mg total) by mouth daily. 90 tablet 3  . metoprolol succinate (TOPROL-XL) 100 MG 24 hr tablet TAKE 1 TABLET BY MOUTH DAILY TAKE WITH OR IMMEDIATELY FOLLOWING A MEAL 60 tablet 6  . nitroGLYCERIN (NITROSTAT) 0.4 MG SL tablet Place 1 tablet (0.4 mg total) under the tongue every 5 (five) minutes as needed for chest pain. 30 tablet 0  . pantoprazole (PROTONIX) 40 MG tablet Take 40 mg by mouth daily.    . rivaroxaban (XARELTO) 20 MG TABS tablet Take 1 tablet (20 mg total) by mouth daily with supper. 30 tablet 11   No current facility-administered medications for this visit.      Past Surgical History:  Procedure Laterality Date  . ANKLE SURGERY Left 2012   tendon repair  . CARDIAC CATHETERIZATION [name] 01/29/2016   Procedure: Left Heart Cath and Coronary Angiography;  Surgeon: [name], MD;  Location: [facility];  Service: Cardiovascular;  Laterality: [name];  . CATARACT EXTRACTION W/PHACO  11/11/2012   Procedure:  CATARACT EXTRACTION PHACO AND INTRAOCULAR LENS PLACEMENT (IOC);  Surgeon: Tonny Courvoisier Hamblen, MD;  Location: [facility];  Service: Ophthalmology;  Laterality: Right;  CDE:  5.56  . CATARACT EXTRACTION W/PHACO Left 12/15/2013   Procedure: CATARACT EXTRACTION PHACO AND INTRAOCULAR LENS PLACEMENT (IOC);  Surgeon: Tonny Tashema Tiller, MD;  Location: [facility];  Service: Ophthalmology;  Laterality: Left;  CDE:13.36  . CHOLECYSTECTOMY    . COLONOSCOPY    . CYSTOSCOPY W/ URETERAL STENT PLACEMENT     right  . ELECTROPHYSIOLOGIC STUDY [name] 06/02/2016   Procedure: A-Flutter Ablation;  Surgeon: [name], MD;  Location: [facility];  Service: Cardiovascular;  Laterality: [name];  . left knee sugery Left    Arthroscopy  . NASAL SINUS SURGERY    . POLYPECTOMY      . TONSILLECTOMY       Allergies  Allergen Reactions  . Bee Venom Swelling  . Codeine Nausea Only      Family History  Problem Relation Age of Onset  . Heart murmur Mother   . Aneurysm Mother   . Hypertension Mother   . Cirrhosis Father   . Heart attack Sister   . Diabetes Sister   . Aneurysm Maternal Grandmother   . Stroke Maternal Grandmother   . Heart attack Paternal Grandmother   . Stroke Sister   . Colon cancer Neg Hx   . Rectal cancer Neg Hx   . Stomach cancer Neg Hx      Social History Mr. Blais reports that he has never smoked. He has never used smokeless tobacco. Mr. Zelada reports that he drinks alcohol.   Review of Systems CONSTITUTIONAL: No weight loss, fever, chills, weakness or fatigue.  HEENT: Eyes: No visual loss, blurred vision, double vision or yellow sclerae.No hearing loss, sneezing, congestion, runny nose or sore throat.  SKIN: No rash or itching.  CARDIOVASCULAR: per hpi RESPIRATORY: No shortness of breath, cough or sputum.  GASTROINTESTINAL: No anorexia, nausea, vomiting or diarrhea. No abdominal pain or blood.  GENITOURINARY: No burning on urination, no polyuria NEUROLOGICAL: No headache, dizziness, syncope, paralysis, ataxia, numbness or tingling in the extremities. No change in bowel or bladder control.  MUSCULOSKELETAL: No muscle, back pain, joint pain or stiffness.  LYMPHATICS: No enlarged nodes. No history of splenectomy.  PSYCHIATRIC: No history of depression or anxiety.  ENDOCRINOLOGIC: No reports of sweating, cold or heat intolerance. No polyuria or polydipsia.  [name]   Physical Examination Vitals:   10/27/16 0859  BP: 139/76  Pulse: 60   Vitals:   10/27/16 0859  Weight: 277 lb (125.6 kg)  Height: 5\' 10"  (1.778 m)    Gen: resting comfortably, no acute distress HEENT: no scleral icterus, pupils equal round and reactive, no palptable cervical adenopathy,  CV: RRR, no m/r/g, no jvd Resp: Clear to auscultation bilaterally GI:  abdomen is soft, non-tender, non-distended, normal bowel sounds, no hepatosplenomegaly MSK: extremities are warm, no edema.  Skin: warm, no rash Neuro:  no focal deficits Psych: appropriate affect   Diagnostic Studies 01/2016 echo Study Conclusions  - Left ventricle: The cavity size was mildly dilated. Wall  thickness was increased in a pattern of mild LVH. Systolic  function was normal. The estimated ejection fraction was in the  range of 55% to 60%.  01/2016 cath 1. Dist LAD lesion, 80% stenosed. 2. Mid RCA lesion, 60% stenosed. 3. Ost 2nd Diag to 2nd Diag lesion, 60% stenosed. 4. 2nd Mrg lesion, 30% stenosed. 5. Mid LAD lesion, 25% stenosed. 6. LPDA  lesion, 45% stenosed.   Significant apical LAD stenosis, best treated with medication. Moderate disease in the second diagonal and mid nondominant RCA.  Normal left ventricular systolic function. Normal left ventricular filling pressures.  Post Cath Recommendations:   Medical therapy of both atrial fibrillation and coronary artery disease. Percutaneous intervention is not currently indicated for coronary disease.    Assessment and Plan   1. Aflutter - s/p ablation. No recent symptoms - continue current meds - CHADS2Vasc score is 2, he will continue xarelto.   2. CAD - cath with distal apical disease, otherwise moderate disease - mild fairly atypical symptoms, will continue to monitor. Can consider further imdur titration if progresses  3. HTN - at goal, continue current meds   4. OSA - per Dr Luan Pulling       Arnoldo Lenis, M.D.

## 2016-10-27 NOTE — Patient Instructions (Signed)

## 2017-01-21 ENCOUNTER — Other Ambulatory Visit: Payer: Self-pay | Admitting: Physician Assistant

## 2017-05-08 ENCOUNTER — Other Ambulatory Visit: Payer: Self-pay | Admitting: Orthopedic Surgery

## 2017-05-08 DIAGNOSIS — M25561 Pain in right knee: Secondary | ICD-10-CM

## 2017-05-14 ENCOUNTER — Ambulatory Visit
Admission: RE | Admit: 2017-05-14 | Discharge: 2017-05-14 | Disposition: A | Discharge status: Home / Self Care | Payer: 59 | Source: Ambulatory Visit | Attending: Orthopedic Surgery | Admitting: Orthopedic Surgery

## 2017-05-14 DIAGNOSIS — M25561 Pain in right knee: Secondary | ICD-10-CM

## 2017-05-22 ENCOUNTER — Encounter (HOSPITAL_BASED_OUTPATIENT_CLINIC_OR_DEPARTMENT_OTHER): Payer: Self-pay | Admitting: *Deleted

## 2017-05-22 NOTE — Progress Notes (Signed)
   05/22/17 1422  OBSTRUCTIVE SLEEP APNEA  Have you ever been diagnosed with sleep apnea through a sleep study? Yes  If yes, do you have and use a CPAP or BPAP machine every night? 0 (has not worn CPAP x4 mos)  Do you snore loudly (loud enough to be heard through closed doors)?  1  Do you often feel tired, fatigued, or sleepy during the daytime (such as falling asleep during driving or talking to someone)? 0  Has anyone observed you stop breathing during your sleep? 0  Do you have, or are you being treated for high blood pressure? 1  BMI more than 35 kg/m2? 1  Age > 17 (1-yes) 1  Male Gender (Yes=1) 1  Obstructive Sleep Apnea Score 5

## 2017-05-22 NOTE — Progress Notes (Signed)
Per pt cardiologist Dr Harl Bowie, he will hold ASA x 7days and stop Xarelto 2d prior to surgery. He will come in for BMET and EKG. Patient had OSA but has not used CPAP in over 52mos stating he cannot keep mask from sliding off. Told pt to bring all meds and CPAP dos for [facility] stay.

## 2017-05-25 ENCOUNTER — Ambulatory Visit (HOSPITAL_BASED_OUTPATIENT_CLINIC_OR_DEPARTMENT_OTHER)
Admission: RE | Admit: 2017-05-25 | Discharge: 2017-05-28 | Disposition: A | Discharge status: Home / Self Care | Payer: 59 | Source: Ambulatory Visit | Attending: Orthopedic Surgery | Admitting: Orthopedic Surgery

## 2017-05-25 ENCOUNTER — Encounter (HOSPITAL_BASED_OUTPATIENT_CLINIC_OR_DEPARTMENT_OTHER)
Admission: RE | Admit: 2017-05-25 | Discharge: 2017-05-25 | Disposition: A | Discharge status: Home / Self Care | Payer: 59 | Source: Ambulatory Visit | Attending: Internal Medicine | Admitting: Internal Medicine

## 2017-05-25 ENCOUNTER — Other Ambulatory Visit: Payer: 59

## 2017-05-25 ENCOUNTER — Other Ambulatory Visit: Payer: Self-pay

## 2017-05-25 DIAGNOSIS — K219 Gastro-esophageal reflux disease without esophagitis: Secondary | ICD-10-CM | POA: Diagnosis not present

## 2017-05-25 DIAGNOSIS — R001 Bradycardia, unspecified: Secondary | ICD-10-CM | POA: Diagnosis not present

## 2017-05-25 DIAGNOSIS — Z7901 Long term (current) use of anticoagulants: Secondary | ICD-10-CM | POA: Diagnosis not present

## 2017-05-25 DIAGNOSIS — Z9889 Other specified postprocedural states: Secondary | ICD-10-CM | POA: Diagnosis not present

## 2017-05-25 DIAGNOSIS — Z7982 Long term (current) use of aspirin: Secondary | ICD-10-CM | POA: Insufficient documentation

## 2017-05-25 DIAGNOSIS — I252 Old myocardial infarction: Secondary | ICD-10-CM | POA: Insufficient documentation

## 2017-05-25 DIAGNOSIS — Z01818 Encounter for other preprocedural examination: Secondary | ICD-10-CM | POA: Diagnosis not present

## 2017-05-25 DIAGNOSIS — I10 Essential (primary) hypertension: Secondary | ICD-10-CM | POA: Diagnosis not present

## 2017-05-25 DIAGNOSIS — Z79899 Other long term (current) drug therapy: Secondary | ICD-10-CM | POA: Insufficient documentation

## 2017-05-25 DIAGNOSIS — I2510 Atherosclerotic heart disease of native coronary artery without angina pectoris: Secondary | ICD-10-CM | POA: Diagnosis not present

## 2017-05-25 DIAGNOSIS — Z7984 Long term (current) use of oral hypoglycemic drugs: Secondary | ICD-10-CM | POA: Insufficient documentation

## 2017-05-25 DIAGNOSIS — E7800 Pure hypercholesterolemia, unspecified: Secondary | ICD-10-CM | POA: Diagnosis not present

## 2017-05-25 DIAGNOSIS — E119 Type 2 diabetes mellitus without complications: Secondary | ICD-10-CM | POA: Diagnosis not present

## 2017-05-25 DIAGNOSIS — X58XXXA Exposure to other specified factors, initial encounter: Secondary | ICD-10-CM | POA: Diagnosis not present

## 2017-05-25 DIAGNOSIS — M6751 Plica syndrome, right knee: Secondary | ICD-10-CM | POA: Diagnosis not present

## 2017-05-25 DIAGNOSIS — M2241 Chondromalacia patellae, right knee: Secondary | ICD-10-CM | POA: Insufficient documentation

## 2017-05-25 DIAGNOSIS — S83241A Other tear of medial meniscus, current injury, right knee, initial encounter: Secondary | ICD-10-CM | POA: Diagnosis not present

## 2017-05-25 DIAGNOSIS — Z833 Family history of diabetes mellitus: Secondary | ICD-10-CM | POA: Diagnosis not present

## 2017-05-25 HISTORY — DX: Gastro-esophageal reflux disease without esophagitis: K21.9

## 2017-05-25 LAB — BASIC METABOLIC PANEL
Anion gap: 8 (ref 5–15)
BUN: 15 mg/dL (ref 6–20)
CO2: 25 mmol/L (ref 22–32)
Calcium: 8.9 mg/dL (ref 8.9–10.3)
Chloride: 103 mmol/L (ref 101–111)
Creatinine, Ser: 0.84 mg/dL (ref 0.61–1.24)
GFR calc Af Amer: >60 mL/min (ref >60)
GFR calc non Af Amer: >60 mL/min (ref >60)
Glucose, Bld: 121 mg/dL — ABNORMAL HIGH (ref 65–99)
Potassium: 5.5 mmol/L — ABNORMAL HIGH (ref 3.5–5.1)
Sodium: 136 mmol/L (ref 135–145)

## 2017-05-25 NOTE — Pre-Procedure Instructions (Signed)
Dr. Sabra Heck notified of K+ 5.5; advised of PMH and  EF 55-60% 01/2016; pt. OK to come for surgery.

## 2017-05-25 NOTE — H&P (Signed)
This is a pleasant 61 year-old gentleman who presents to our clinic today with right knee pain.  He states this began about 3-4 weeks ago when he was doing a squat and all of a sudden felt pain and somewhat of a popping sensation in the lateral aspect of his knee.  All of his pain is lateral.  Some instability, but this is preceded by pain.  Pain is worse with squatting and pivoting.  No radicular symptoms noted.  Of note, he does have a history of a left knee arthroscopic debridement several years back and is doing much better on the left knee. Past medical history: Significant for glasses, cataract, hearing loss, tinnitus, hypertension, heart attack, easy bleeding, sleep apnea, diabetes, hypercholesterolemia, GERD and atrial fibrillation.  Allergies: No known drug allergies. Current medications: Glimepiride, Metformin, Xarelto, Benazepril, Metoprolol, Atorvastatin, Nabumetone, Isosorbide and Pantoprazole. Family history: Significant for diabetes, hypertension, heart disease and arthritis.     Social history: Does not smoke or drink.  He is married and lives with his wife.  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     EXAMINATION: Well-developed, well-nourished gentleman in no acute distress.  Alert and oriented x 3.  Height: 5?10.  Examination of his right knee reveals range of motion from 0-120 degrees.  No effusion.  Minimal patellofemoral crepitus.  Marked tenderness lateral meniscus with positive lateral McMurray.  Ligaments are stable.    X-RAYS: X-rays reveal moderate medial compartment degenerative changes.  Minimal patellofemoral and lateral changes.    IMPRESSION: Right knee lateral meniscus tear.    PLAN:  At this point we are going to go ahead and get an MRI of Traeton's right knee to assess his lateral meniscus.  He is to follow up with [place] once this is completed.    Addendum: This is a pleasant 61 year old gentleman who presents to our clinic today to discuss MRI results of his right knee. MRI  results of the right knee from 05-14-2017 reveal medial and lateral meniscus tears with moderate medial and lateral compartment osteoarthritis.   PLAN: At this point, Jocob has had intermittent mechanical symptoms, which is contributed more likely to his meniscal tears. He would like to proceed with definitive treatment options to include a right knee arthroscopic debridement, medial and lateral meniscus, as well as chondroplasty. The risks, benefits and possible complications were reviewed and rehab discussed. All questions were answered. Paperwork complete.

## 2017-05-26 ENCOUNTER — Other Ambulatory Visit: Payer: Self-pay | Admitting: Cardiology

## 2017-05-28 ENCOUNTER — Encounter (HOSPITAL_BASED_OUTPATIENT_CLINIC_OR_DEPARTMENT_OTHER)
Admission: RE | Disposition: A | Discharge status: Home / Self Care | Payer: Self-pay | Source: Ambulatory Visit | Attending: Orthopedic Surgery

## 2017-05-28 ENCOUNTER — Encounter (HOSPITAL_BASED_OUTPATIENT_CLINIC_OR_DEPARTMENT_OTHER): Payer: Self-pay | Admitting: *Deleted

## 2017-05-28 ENCOUNTER — Ambulatory Visit (HOSPITAL_BASED_OUTPATIENT_CLINIC_OR_DEPARTMENT_OTHER): Payer: 59 | Admitting: Anesthesiology

## 2017-05-28 DIAGNOSIS — S83241A Other tear of medial meniscus, current injury, right knee, initial encounter: Secondary | ICD-10-CM | POA: Diagnosis not present

## 2017-05-28 HISTORY — PX: KNEE ARTHROSCOPY WITH MEDIAL MENISECTOMY: SHX5651

## 2017-05-28 HISTORY — PX: CHONDROPLASTY: SHX5177

## 2017-05-28 LAB — GLUCOSE, CAPILLARY
Glucose-Capillary: 155 mg/dL — ABNORMAL HIGH (ref 65–99)
Glucose-Capillary: 170 mg/dL — ABNORMAL HIGH (ref 65–99)

## 2017-05-28 SURGERY — CHONDROPLASTY
Anesthesia: General | Site: Knee | Laterality: Right

## 2017-05-28 MED ORDER — LACTATED RINGERS IV SOLN
INTRAVENOUS | Status: DC
  Administered 2017-05-28: 10:00:00 via INTRAVENOUS

## 2017-05-28 MED ORDER — BUPIVACAINE HCL (PF) 0.5 % IJ SOLN
INTRAMUSCULAR | Status: AC
  Filled 2017-05-28: qty 30

## 2017-05-28 MED ORDER — MIDAZOLAM HCL 2 MG/2ML IJ SOLN
INTRAMUSCULAR | Status: AC
  Filled 2017-05-28: qty 2

## 2017-05-28 MED ORDER — MIDAZOLAM HCL 2 MG/2ML IJ SOLN
1.0000 mg | INTRAMUSCULAR | Status: DC | PRN
  Administered 2017-05-28: 2 mg via INTRAVENOUS

## 2017-05-28 MED ORDER — METOCLOPRAMIDE HCL 5 MG/ML IJ SOLN: 10.0000 mg | Freq: Once | INTRAMUSCULAR | Status: DC | PRN

## 2017-05-28 MED ORDER — ONDANSETRON HCL 4 MG PO TABS: 4.0000 mg | ORAL_TABLET | Freq: Three times a day (TID) | ORAL | 0 refills | Status: DC | PRN

## 2017-05-28 MED ORDER — ONDANSETRON HCL 4 MG/2ML IJ SOLN
INTRAMUSCULAR | Status: DC | PRN
  Administered 2017-05-28: 4 mg via INTRAVENOUS

## 2017-05-28 MED ORDER — ONDANSETRON HCL 4 MG/2ML IJ SOLN
INTRAMUSCULAR | Status: AC
  Filled 2017-05-28: qty 2

## 2017-05-28 MED ORDER — CHLORHEXIDINE GLUCONATE 4 % EX LIQD: 60.0000 mL | Freq: Once | CUTANEOUS | Status: DC

## 2017-05-28 MED ORDER — METHYLPREDNISOLONE ACETATE 80 MG/ML IJ SUSP
INTRAMUSCULAR | Status: AC
  Filled 2017-05-28: qty 1

## 2017-05-28 MED ORDER — FENTANYL CITRATE (PF) 100 MCG/2ML IJ SOLN
25.0000 ug | INTRAMUSCULAR | Status: DC | PRN
  Administered 2017-05-28 (×3): 25 ug via INTRAVENOUS
  Administered 2017-05-28: 50 ug via INTRAVENOUS

## 2017-05-28 MED ORDER — METHYLPREDNISOLONE ACETATE 80 MG/ML IJ SUSP
INTRAMUSCULAR | Status: DC | PRN
  Administered 2017-05-28: 80 mg

## 2017-05-28 MED ORDER — LIDOCAINE 2% (20 MG/ML) 5 ML SYRINGE
INTRAMUSCULAR | Status: DC | PRN
  Administered 2017-05-28: 60 mg via INTRAVENOUS

## 2017-05-28 MED ORDER — BUPIVACAINE HCL (PF) 0.5 % IJ SOLN
INTRAMUSCULAR | Status: DC | PRN
  Administered 2017-05-28: 20 mL

## 2017-05-28 MED ORDER — FENTANYL CITRATE (PF) 100 MCG/2ML IJ SOLN
INTRAMUSCULAR | Status: AC
  Filled 2017-05-28: qty 2

## 2017-05-28 MED ORDER — SCOPOLAMINE 1 MG/3DAYS TD PT72: 1.0000 | MEDICATED_PATCH | Freq: Once | TRANSDERMAL | Status: DC | PRN

## 2017-05-28 MED ORDER — DEXAMETHASONE SODIUM PHOSPHATE 4 MG/ML IJ SOLN
INTRAMUSCULAR | Status: DC | PRN
  Administered 2017-05-28: 10 mg via INTRAVENOUS

## 2017-05-28 MED ORDER — LIDOCAINE HCL (CARDIAC) 20 MG/ML IV SOLN
INTRAVENOUS | Status: AC
  Filled 2017-05-28: qty 5

## 2017-05-28 MED ORDER — DEXAMETHASONE SODIUM PHOSPHATE 10 MG/ML IJ SOLN
INTRAMUSCULAR | Status: AC
  Filled 2017-05-28: qty 1

## 2017-05-28 MED ORDER — MEPERIDINE HCL 25 MG/ML IJ SOLN: 6.2500 mg | INTRAMUSCULAR | Status: DC | PRN

## 2017-05-28 MED ORDER — SODIUM CHLORIDE 0.9 % IR SOLN
Status: DC | PRN
  Administered 2017-05-28: 6000 mL

## 2017-05-28 MED ORDER — CEFAZOLIN SODIUM-DEXTROSE 2-4 GM/100ML-% IV SOLN
INTRAVENOUS | Status: AC
  Filled 2017-05-28: qty 100

## 2017-05-28 MED ORDER — CEFAZOLIN SODIUM-DEXTROSE 1-4 GM/50ML-% IV SOLN
INTRAVENOUS | Status: AC
  Filled 2017-05-28: qty 50

## 2017-05-28 MED ORDER — FENTANYL CITRATE (PF) 100 MCG/2ML IJ SOLN
INTRAMUSCULAR | Status: AC
Start: 2017-05-28 — End: ?
  Filled 2017-05-28: qty 2

## 2017-05-28 MED ORDER — OXYCODONE-ACETAMINOPHEN 5-325 MG PO TABS: 1.0000 | ORAL_TABLET | ORAL | 0 refills | Status: DC | PRN

## 2017-05-28 MED ORDER — LACTATED RINGERS IV SOLN: INTRAVENOUS | Status: DC

## 2017-05-28 MED ORDER — PROPOFOL 10 MG/ML IV BOLUS
INTRAVENOUS | Status: DC | PRN
  Administered 2017-05-28: 200 mg via INTRAVENOUS
  Administered 2017-05-28: 20 mg via INTRAVENOUS

## 2017-05-28 MED ORDER — DEXTROSE 5 % IV SOLN
3.0000 g | INTRAVENOUS | Status: AC
  Administered 2017-05-28: 3 g via INTRAVENOUS

## 2017-05-28 MED ORDER — FENTANYL CITRATE (PF) 100 MCG/2ML IJ SOLN
50.0000 ug | INTRAMUSCULAR | Status: AC | PRN
  Administered 2017-05-28 (×3): 50 ug via INTRAVENOUS

## 2017-05-28 SURGICAL SUPPLY — 39 items
BANDAGE ACE 6X5 VEL STRL LF (GAUZE/BANDAGES/DRESSINGS) ×3 IMPLANT
BLADE CUDA 5.5 (BLADE) IMPLANT
BLADE CUDA GRT WHITE 3.5 (BLADE) IMPLANT
BLADE CUTTER GATOR 3.5 (BLADE) ×3 IMPLANT
BLADE CUTTER MENIS 5.5 (BLADE) IMPLANT
BLADE GREAT WHITE 4.2 (BLADE) ×2 IMPLANT
BLADE GREAT WHITE 4.2MM (BLADE) ×1
CUTTER MENISCUS  4.2MM (BLADE)
CUTTER MENISCUS 4.2MM (BLADE) IMPLANT
DRAPE ARTHROSCOPY W/POUCH 90 (DRAPES) ×3 IMPLANT
DURAPREP 26ML APPLICATOR (WOUND CARE) ×3 IMPLANT
ELECT MENISCUS 165MM 90D (ELECTRODE) IMPLANT
ELECT REM PT RETURN 9FT ADLT (ELECTROSURGICAL)
ELECTRODE REM PT RTRN 9FT ADLT (ELECTROSURGICAL) IMPLANT
GAUZE SPONGE 4X4 12PLY STRL (GAUZE/BANDAGES/DRESSINGS) ×3 IMPLANT
GAUZE XEROFORM 1X8 LF (GAUZE/BANDAGES/DRESSINGS) ×3 IMPLANT
GLOVE BIO SURGEON STRL SZ 6.5 (GLOVE) ×1 IMPLANT
GLOVE BIO SURGEONS STRL SZ 6.5 (GLOVE) ×1
GLOVE BIOGEL PI IND STRL 7.0 (GLOVE) ×1 IMPLANT
GLOVE BIOGEL PI INDICATOR 7.0 (GLOVE) ×2
GLOVE ECLIPSE 7.0 STRL STRAW (GLOVE) ×3 IMPLANT
GLOVE SURG ORTHO 8.0 STRL STRW (GLOVE) ×3 IMPLANT
GOWN STRL REUS W/ TWL LRG LVL3 (GOWN DISPOSABLE) ×1 IMPLANT
GOWN STRL REUS W/ TWL XL LVL3 (GOWN DISPOSABLE) ×2 IMPLANT
GOWN STRL REUS W/TWL LRG LVL3 (GOWN DISPOSABLE) ×3
GOWN STRL REUS W/TWL XL LVL3 (GOWN DISPOSABLE) ×6
HOLDER KNEE FOAM BLUE (MISCELLANEOUS) ×3 IMPLANT
IV NS IRRIG 3000ML ARTHROMATIC (IV SOLUTION) ×6 IMPLANT
KNEE WRAP E Z 3 GEL PACK (MISCELLANEOUS) ×3 IMPLANT
MANIFOLD NEPTUNE II (INSTRUMENTS) ×3 IMPLANT
PACK ARTHROSCOPY DSU (CUSTOM PROCEDURE TRAY) ×3 IMPLANT
PACK BASIN DAY SURGERY FS (CUSTOM PROCEDURE TRAY) ×3 IMPLANT
PENCIL BUTTON HOLSTER BLD 10FT (ELECTRODE) IMPLANT
SET ARTHROSCOPY TUBING (MISCELLANEOUS) ×3
SET ARTHROSCOPY TUBING LN (MISCELLANEOUS) ×1 IMPLANT
SUT ETHILON 3 0 PS 1 (SUTURE) ×3 IMPLANT
SUT VIC AB 3-0 FS2 27 (SUTURE) IMPLANT
TOWEL OR 17X24 6PK STRL BLUE (TOWEL DISPOSABLE) ×3 IMPLANT
WATER STERILE IRR 1000ML POUR (IV SOLUTION) ×3 IMPLANT

## 2017-05-28 NOTE — Discharge Instructions (Signed)
Discharge Instructions after Knee Arthroscopy ° ° °You will have a light dressing on your knee.  °Leave the dressing in place until the third day after your surgery and then remove it and place a band-aid over the stitches.  °After the bandage has been removed you may shower, but do not soak the incision. °You may begin gentle motion of your leg immediately after surgery. °Pump your foot up and down 20 times per hour, every hour you are awake.  °Apply ice to the knee 3 times per day for 30 minutes for the first 1 week until your knee is feeling comfortable again. Do not use heat.  °You may begin straight leg raising exercises (if you have a brace with it on). While lying down, pull your foot all the way up, tighten your quadriceps muscle and lift your heel off of the ground. Hold this position for 2 seconds, and then let the leg back down. Repeat the exercise 10 times, at least 3 times a day.  °Pain medicine has been prescribed for you.  °Use your medicine as needed over the first 48 hours, and then you can begin to taper your use. You may take Extra Strength Tylenol or Tylenol only in place of the pain pills.  ° ° °Please call [phone] for any problems. Including the following: ° °- excessive redness of the incisions °- drainage for more than 4 days °- fever of more than 101.5 F ° °*Please note that pain medications will not be refilled after hours or on weekends. ° ° ° ° ° °Post Anesthesia Home Care Instructions ° °Activity: °Get plenty of rest for the remainder of the day. A responsible individual must stay with you for 24 hours following the procedure.  °For the next 24 hours, DO NOT: °-Drive a car °-Operate machinery °-Drink alcoholic beverages °-Take any medication unless instructed by your physician °-Make any legal decisions or sign important papers. ° °Meals: °Start with liquid foods such as gelatin or soup. Progress to regular foods as tolerated. Avoid greasy, spicy, heavy foods. If nausea and/or  vomiting occur, drink only clear liquids until the nausea and/or vomiting subsides. Call your physician if vomiting continues. ° °Special Instructions/Symptoms: °Your throat may feel dry or sore from the anesthesia or the breathing tube placed in your throat during surgery. If this causes discomfort, gargle with warm salt water. The discomfort should disappear within 24 hours. ° °If you had a scopolamine patch placed behind your ear for the management of post- operative nausea and/or vomiting: ° °1. The medication in the patch is effective for 72 hours, after which it should be removed.  Wrap patch in a tissue and discard in the trash. Wash hands thoroughly with soap and water. °2. You may remove the patch earlier than 72 hours if you experience unpleasant side effects which may include dry mouth, dizziness or visual disturbances. °3. Avoid touching the patch. Wash your hands with soap and water after contact with the patch. °  ° ° °

## 2017-05-28 NOTE — Anesthesia Procedure Notes (Signed)
Procedure Name: LMA Insertion Performed by: Terrance Mass Pre-anesthesia Checklist: Patient identified, Emergency Drugs available, Suction available and Patient being monitored Patient Re-evaluated:Patient Re-evaluated prior to induction Oxygen Delivery Method: Circle system utilized Preoxygenation: Pre-oxygenation with 100% oxygen Induction Type: IV induction Ventilation: Mask ventilation without difficulty LMA: LMA inserted LMA Size: 5.0 Number of attempts: 1 Airway Equipment and Method: Bite block Placement Confirmation: positive ETCO2 Tube secured with: Tape Dental Injury: Teeth and Oropharynx as per pre-operative assessment

## 2017-05-28 NOTE — Anesthesia Preprocedure Evaluation (Signed)
Anesthesia Evaluation  Patient identified by MRN, date of birth, ID band Patient awake    Reviewed: Allergy & Precautions, NPO status , Patient's Chart, lab work & pertinent test results  Airway Mallampati: II  TM Distance: >3 FB Neck ROM: Full    Dental no notable dental hx.    Pulmonary sleep apnea ,    Pulmonary exam normal breath sounds clear to auscultation       Cardiovascular hypertension, Pt. on medications + CAD  Normal cardiovascular exam+ dysrhythmias Atrial Fibrillation  Rhythm:Regular Rate:Normal  Left Heart Cath and Coronary Angiography 3/17 Conclusion   1. Dist LAD lesion, 80% stenosed. 2. Mid RCA lesion, 60% stenosed. 3. Ost 2nd Diag to 2nd Diag lesion, 60% stenosed. 4. 2nd Mrg lesion, 30% stenosed. 5. Mid LAD lesion, 25% stenosed. 6. LPDA lesion, 45% stenosed.    Significant apical LAD stenosis, best treated with medication. Moderate disease in the second diagonal and mid nondominant RCA.  Normal left ventricular systolic function. Normal left ventricular filling pressures.  Recommendations:   Medical therapy of both atrial fibrillation and coronary artery disease. Percutaneous intervention is not currently indicated for coronary disease.     Neuro/Psych negative neurological ROS  negative psych ROS   GI/Hepatic negative GI ROS, Neg liver ROS,   Endo/Other  diabetes, Type 2  Renal/GU negative Renal ROS  negative genitourinary   Musculoskeletal negative musculoskeletal ROS (+)   Abdominal   Peds negative pediatric ROS (+)  Hematology negative hematology ROS (+)   Anesthesia Other Findings   Reproductive/Obstetrics negative OB ROS                            Anesthesia Physical Anesthesia Plan  ASA: III  Anesthesia Plan: General   Post-op Pain Management:    Induction: Intravenous  PONV Risk Score and Plan: 2 and Ondansetron and  Dexamethasone  Airway Management Planned: LMA  Additional Equipment:   Intra-op Plan:   Post-operative Plan:   Informed Consent: I have reviewed the patients History and Physical, chart, labs and discussed the procedure including the risks, benefits and alternatives for the proposed anesthesia with the patient or authorized representative who has indicated his/her understanding and acceptance.   Dental advisory given  Plan Discussed with: CRNA  Anesthesia Plan Comments:        Anesthesia Quick Evaluation

## 2017-05-28 NOTE — Interval H&P Note (Signed)
History and Physical Interval Note:  05/28/2017 9:37 AM  Lucas [name]  has presented today for surgery, with the diagnosis of [place],  The various methods of treatment have been discussed with the patient and family. After consideration of risks, benefits and other options for treatment, the patient has consented to  Procedure(s): RIGHT KNEE CHONDROPLASTY (Right) KNEE ARTHROSCOPY WITH MEDIAL AND LATERAL MENISECTOMY (Right) as a surgical intervention .  The patient's history has been reviewed, patient examined, no change in status, stable for surgery.  I have reviewed the patient's chart and labs.  Questions were answered to the patient's satisfaction.     [name]

## 2017-05-28 NOTE — Anesthesia Postprocedure Evaluation (Signed)
Anesthesia Post Note  Patient: Lucas [name]  Procedure(s) Performed: Procedure(s) (LRB): RIGHT KNEE CHONDROPLASTY (Right) KNEE ARTHROSCOPY WITH MEDIAL MENISECTOMY WITH EXTENSIVE SYNOVECTOMY (Right)     Patient location during evaluation: PACU Anesthesia Type: General Level of consciousness: awake and alert Pain management: pain level controlled Vital Signs Assessment: post-procedure vital signs reviewed and stable Respiratory status: spontaneous breathing, nonlabored ventilation, respiratory function stable and patient connected to nasal cannula oxygen Cardiovascular status: blood pressure returned to baseline and stable Postop Assessment: no signs of nausea or vomiting Anesthetic complications: no    Last Vitals:  Vitals:   05/28/17 1245 05/28/17 1300  BP: (!) 145/68 (!) 157/67  Pulse: 66 62  Resp: 12 18  Temp:  36.4 C    Last Pain:  Vitals:   05/28/17 1300  TempSrc: Oral  PainSc: 6                  Catalina Gravel

## 2017-05-28 NOTE — Transfer of Care (Signed)
Immediate Anesthesia Transfer of Care Note  Patient: Lucas [name]  Procedure(s) Performed: Procedure(s): RIGHT KNEE CHONDROPLASTY (Right) KNEE ARTHROSCOPY WITH MEDIAL MENISECTOMY WITH EXTENSIVE SYNOVECTOMY (Right)  Patient Location: PACU  Anesthesia Type:General  Level of Consciousness: awake and sedated  Airway & Oxygen Therapy: Patient Spontanous Breathing and Patient connected to face mask oxygen  Post-op Assessment: Report given to RN and Post -op Vital signs reviewed and stable  Post vital signs: Reviewed and stable  Last Vitals:  Vitals:   05/28/17 0948  BP: 119/62  Pulse: (!) 55  Resp: 18  Temp: 36.7 C    Last Pain:  Vitals:   05/28/17 0948  TempSrc: Oral  PainSc: 2       Patients Stated Pain Goal: 0 (74/82/70 [id number])  Complications: No apparent anesthesia complications

## 2017-05-29 ENCOUNTER — Encounter (HOSPITAL_BASED_OUTPATIENT_CLINIC_OR_DEPARTMENT_OTHER): Payer: Self-pay | Admitting: Orthopedic Surgery

## 2017-05-29 NOTE — Op Note (Signed)
NAME:  Lucas [name], Lucas [name] NO.:  MEDICAL RECORD NO.:  [id number]  LOCATION:                                 FACILITY:  PHYSICIAN:  [name], M.D.      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  POSTOPERATIVE DIAGNOSIS:  Tricompartmental chondromalacia medial plica, extensive reactive synovitis.  Marked tearing medial meniscus. Tricompartmental changes with some focal grade 3 and 4 lateral femoral condyle.  PROCEDURE:  Right knee exam under anesthesia, arthroscopy.  Debrided medial meniscus.  Tricompartmental chondroplasty.  Tricompartmental synovectomy.  SURGEON:  [name], M.D.  ASSISTANT:  Elmyra Ricks, PA.  ANESTHESIA:  General.  BLOOD LOSS:  Minimal.  SPECIMENS:  None.  CULTURES:  None.  COMPLICATIONS:  None.  DRESSINGS:  Soft compressive.  TOURNIQUET:  Not employed.  DESCRIPTION OF PROCEDURE:  The patient was brought to the operating room and after adequate anesthesia had been obtained, leg holder applied, leg prepped and draped in usual sterile fashion.  Two portals; one each medial and lateral parapatellar.  Arthroscope was introduced, knee distended and inspected.  A lot of reactive synovitis throughout debrided.  Medial plica excised.  Patellofemoral joint grade 2 mild grade 3 changes treated with chondroplasty.  Good tracking.  ACL intact. Lateral meniscus was intact.  A deep grade 3-4 almost 2 cm chondral lesion weightbearing dome debrided back to healthy tissue.  Nothing that really warranted biker fracturing.  Medially large radial tear, posterior attachment.  Complex tearing throughout the entire posterior 3rd.  Taken out to a stable rim, tapered in smoothly.  Some grade 2 mild grade 3 changes treated with chondroplasty.  Entire  knee examined.  No other findings were appreciated.  Instruments were removed.  Portals were closed with nylon.  Knee injected with Depo- Medrol and Marcaine.  Sterile compressive dressing applied.  Anesthesia reversed.  Brought to the recovery room.  Tolerated the surgery well. No complications.     [name], M.D.     DFM/MEDQ  D:  05/28/2017  T:  05/28/2017  Job:  [id number]

## 2017-06-20 ENCOUNTER — Other Ambulatory Visit: Payer: Self-pay | Admitting: Cardiology

## 2017-07-02 ENCOUNTER — Other Ambulatory Visit: Payer: Self-pay | Admitting: Cardiology

## 2017-07-02 NOTE — Telephone Encounter (Signed)
Patient needs to have metoprolol succinate (TOPROL-XL) 100 MG 24 hr tablet [[id number]]  Filled.  He has made an upcoming appointment for October.   [facility]  [place], [place]

## 2017-07-03 ENCOUNTER — Encounter: Payer: Self-pay | Admitting: Cardiology

## 2017-07-03 ENCOUNTER — Ambulatory Visit (INDEPENDENT_AMBULATORY_CARE_PROVIDER_SITE_OTHER): Payer: 59 | Admitting: Cardiology

## 2017-07-03 ENCOUNTER — Encounter: Payer: Self-pay | Admitting: *Deleted

## 2017-07-03 ENCOUNTER — Other Ambulatory Visit: Payer: Self-pay | Admitting: Cardiovascular Disease

## 2017-07-03 VITALS — BP 118/72 | HR 73 | Ht 70.0 in | Wt 268.0 lb

## 2017-07-03 DIAGNOSIS — I10 Essential (primary) hypertension: Secondary | ICD-10-CM

## 2017-07-03 DIAGNOSIS — I4892 Unspecified atrial flutter: Secondary | ICD-10-CM

## 2017-07-03 DIAGNOSIS — I2510 Atherosclerotic heart disease of native coronary artery without angina pectoris: Secondary | ICD-10-CM

## 2017-07-03 DIAGNOSIS — R0789 Other chest pain: Secondary | ICD-10-CM | POA: Diagnosis not present

## 2017-07-03 NOTE — Telephone Encounter (Deleted)
Refill- patient requested Metoprolol succinate 100 mg  [facility]  [place]

## 2017-07-03 NOTE — Progress Notes (Signed)
Clinical Summary Lucas [name] is a 61 y.o.male seen today for follow up of the following medical problems.   1. Aflutter - CHADS2Vasc score of 2, on xarelto - s/p aflutter ablation 05/2016 by Dr Curt Bears.    - no recent palpitations - compliant with meds. No bleeding issues on xarelto  2. CAD - previous chest pain episode occurred in the setting of afib with RVR - cath 01/29/16 with apical LAD disease, treated medically. Otherwise diffuse mild to moderate disease  - no exertional chest pain since last visit  3. HTN - compliant with meds - home bp's 120s/70-80s  4. OSA - followed Dr Luan Pulling.  - mixed compliance with CPAP   5. Hyperlipidemia - reports recent labs at work and upcoming labs with pcp   Past Medical History:  Diagnosis Date  . Arthritis   . CAD (coronary artery disease)    a. LHC on 01/29/16 which revealed significant apical LAD stenosis, best treated medically. There was moderate disease in the D2 and mid nondominant RCA.[name] No PCI performed   . Cataract   . Diabetes mellitus    type 2  . GERD (gastroesophageal reflux disease)   . Hyperlipidemia   . Hypertension   . Obesity   . PAF (paroxysmal atrial fibrillation) ([place]) 01/2016  . Sleep apnea    has CPAP machine but not wear     Allergies  Allergen Reactions  . Bee Venom Swelling  . Codeine Nausea Only     Current Outpatient Prescriptions  Medication Sig Dispense Refill  . aspirin 81 MG tablet Take 81 mg by mouth daily.    [name] atorvastatin (LIPITOR) 80 MG tablet TAKE 1 TABLET (80 MG TOTAL) BY MOUTH DAILY AT 6 PM. 90 tablet 3  . benazepril (LOTENSIN) 20 MG tablet Take 20 mg by mouth daily.    . Canagliflozin-Metformin HCl (INVOKAMET) [phone] MG TABS Take 1 tablet by mouth 2 (two) times daily.    [name] diltiazem (CARDIZEM) 30 MG tablet Take 1 tab 2 times daily as needed for palpitations 180 tablet 3  . glimepiride (AMARYL) 4 MG tablet Take 4 mg by mouth 2 (two) times daily.     . isosorbide  mononitrate (IMDUR) 60 MG 24 hr tablet TAKE 1 TABLET (60 MG TOTAL) BY MOUTH DAILY. 90 tablet 3  . metoprolol succinate (TOPROL-XL) 100 MG 24 hr tablet TAKE 1 TABLET BY MOUTH DAILY TAKE WITH OR IMMEDIATELY FOLLOWING A MEAL 15 tablet 0  . nitroGLYCERIN (NITROSTAT) 0.4 MG SL tablet Place 1 tablet (0.4 mg total) under the tongue every 5 (five) minutes as needed for chest pain. 30 tablet 0  . ondansetron (ZOFRAN) 4 MG tablet Take 1 tablet (4 mg total) by mouth every 8 (eight) hours as needed for nausea or vomiting. 40 tablet 0  . oxyCODONE-acetaminophen (ROXICET) 5-325 MG tablet Take 1-2 tablets by mouth every 4 (four) hours as needed. 60 tablet 0  . pantoprazole (PROTONIX) 40 MG tablet Take 40 mg by mouth daily.    [name] 20 MG TABS tablet TAKE 1 TABLET (20 MG TOTAL) BY MOUTH DAILY WITH SUPPER. 90 tablet 3   No current facility-administered medications for this visit.      Past Surgical History:  Procedure Laterality Date  . ANKLE SURGERY Left 2012   tendon repair  . CARDIAC CATHETERIZATION [name] 01/29/2016   Procedure: Left Heart Cath and Coronary Angiography;  Surgeon: [name], MD;  Location: [facility];  Service: Cardiovascular;  Laterality: [name];  . CATARACT EXTRACTION W/PHACO  11/11/2012   Procedure: CATARACT EXTRACTION PHACO AND INTRAOCULAR LENS PLACEMENT (IOC);  Surgeon: Tonny Seng Fouts, MD;  Location: [facility];  Service: Ophthalmology;  Laterality: Right;  CDE:  5.56  . CATARACT EXTRACTION W/PHACO Left 12/15/2013   Procedure: CATARACT EXTRACTION PHACO AND INTRAOCULAR LENS PLACEMENT (IOC);  Surgeon: Tonny Tkai Large, MD;  Location: [facility];  Service: Ophthalmology;  Laterality: Left;  CDE:13.36  . CHOLECYSTECTOMY    . CHONDROPLASTY Right 05/28/2017   Procedure: RIGHT KNEE CHONDROPLASTY;  Surgeon: [name], MD;  Location: [facility] [address];  Service: Orthopedics;  Laterality: Right;  . COLONOSCOPY    . CYSTOSCOPY W/ URETERAL STENT PLACEMENT     right  . ELECTROPHYSIOLOGIC  STUDY [name] 06/02/2016   Procedure: A-Flutter Ablation;  Surgeon: [name], MD;  Location: [facility];  Service: Cardiovascular;  Laterality: [name];  . KNEE ARTHROSCOPY WITH MEDIAL MENISECTOMY Right 05/28/2017   Procedure: KNEE ARTHROSCOPY WITH MEDIAL MENISECTOMY WITH EXTENSIVE SYNOVECTOMY;  Surgeon: [name], MD;  Location: [facility];  Service: Orthopedics;  Laterality: Right;  . left knee sugery Left    Arthroscopy  . NASAL SINUS SURGERY    . POLYPECTOMY    . TONSILLECTOMY       Allergies  Allergen Reactions  . Bee Venom Swelling  . Codeine Nausea Only      Family History  Problem Relation Age of Onset  . Heart murmur Mother   . Aneurysm Mother   . Hypertension Mother   . Cirrhosis Father   . Heart attack Sister   . Diabetes Sister   . Aneurysm Maternal Grandmother   . Stroke Maternal Grandmother   . Heart attack Paternal Grandmother   . Stroke Sister   . Colon cancer Neg Hx   . Rectal cancer Neg Hx   . Stomach cancer Neg Hx      Social History Lucas [name] reports that he has never smoked. He has never used smokeless tobacco. Lucas [name] reports that he drinks alcohol.   Review of Systems CONSTITUTIONAL: No weight loss, fever, chills, weakness or fatigue.  HEENT: Eyes: No visual loss, blurred vision, double vision or yellow sclerae.No hearing loss, sneezing, congestion, runny nose or sore throat.  SKIN: No rash or itching.  CARDIOVASCULAR: per hpi RESPIRATORY: No shortness of breath, cough or sputum.  GASTROINTESTINAL: No anorexia, nausea, vomiting or diarrhea. No abdominal pain or blood.  GENITOURINARY: No burning on urination, no polyuria NEUROLOGICAL: No headache, dizziness, syncope, paralysis, ataxia, numbness or tingling in the extremities. No change in bowel or bladder control.  MUSCULOSKELETAL: No muscle, back pain, joint pain or stiffness.  LYMPHATICS: No enlarged nodes. No history of splenectomy.  PSYCHIATRIC: No  history of depression or anxiety.  ENDOCRINOLOGIC: No reports of sweating, cold or heat intolerance. No polyuria or polydipsia.  [name]   Physical Examination Vitals:   07/03/17 0824  BP: 118/72  Pulse: 73  SpO2: 96%   Vitals:   07/03/17 0824  Weight: 268 lb (121.6 kg)  Height: 5\' 10"  (1.778 m)    Gen: resting comfortably, no acute distress HEENT: no scleral icterus, pupils equal round and reactive, no palptable cervical adenopathy,  CV: RRR, no m/r/g Resp: Clear to auscultation bilaterally GI: abdomen is soft, non-tender, non-distended, normal bowel sounds, no hepatosplenomegaly MSK: extremities are warm, no edema.  Skin: warm, no rash Neuro:  no focal deficits Psych: appropriate affect   Diagnostic Studies 01/2016 echo Study  Conclusions  - Left ventricle: The cavity size was mildly dilated. Wall  thickness was increased in a pattern of mild LVH. Systolic  function was normal. The estimated ejection fraction was in the  range of 55% to 60%.  01/2016 cath 1. Dist LAD lesion, 80% stenosed. 2. Mid RCA lesion, 60% stenosed. 3. Ost 2nd Diag to 2nd Diag lesion, 60% stenosed. 4. 2nd Mrg lesion, 30% stenosed. 5. Mid LAD lesion, 25% stenosed. 6. LPDA lesion, 45% stenosed.   Significant apical LAD stenosis, best treated with medication. Moderate disease in the second diagonal and mid nondominant RCA.  Normal left ventricular systolic function. Normal left ventricular filling pressures.  Post Cath Recommendations:   Medical therapy of both atrial fibrillation and coronary artery disease. Percutaneous intervention is not currently indicated for coronary disease.     Assessment and Plan  1. Aflutter - s/p ablation, he denies any recent symptoms - CHADS2Vasc score is 2, he will continue anticoagulation  2. CAD - cath with distal apical disease, otherwise moderate disease - continue current meds  3. HTN - his bp is at goal, continue current meds   4.  OSA - per Dr Luan Pulling    F/u 6 months  Arnoldo Lenis, M.D.

## 2017-07-03 NOTE — Telephone Encounter (Signed)
°  Refill:   metoprolol succinate (TOPROL-XL) 100 MG 24 hr tablet  Patient only has 6 pills left.  [facility]  [place] [place].

## 2017-07-03 NOTE — Patient Instructions (Signed)

## 2017-07-03 NOTE — Telephone Encounter (Signed)
°  Refill:   metoprolol succinate (TOPROL-XL) 100 MG 24 hr tablet  Patient only has 6 pills left.  [facility]  [place], [place].

## 2017-08-12 ENCOUNTER — Ambulatory Visit: Payer: 59 | Admitting: Cardiology

## 2017-08-22 ENCOUNTER — Emergency Department (HOSPITAL_COMMUNITY): Payer: 59

## 2017-08-22 ENCOUNTER — Encounter (HOSPITAL_COMMUNITY): Payer: Self-pay | Admitting: Emergency Medicine

## 2017-08-22 ENCOUNTER — Emergency Department (HOSPITAL_COMMUNITY)
Admission: EM | Admit: 2017-08-22 | Discharge: 2017-08-22 | Disposition: A | Discharge status: Home / Self Care | Payer: 59 | Attending: Emergency Medicine | Admitting: Emergency Medicine

## 2017-08-22 DIAGNOSIS — I2510 Atherosclerotic heart disease of native coronary artery without angina pectoris: Secondary | ICD-10-CM | POA: Diagnosis not present

## 2017-08-22 DIAGNOSIS — I10 Essential (primary) hypertension: Secondary | ICD-10-CM | POA: Diagnosis not present

## 2017-08-22 DIAGNOSIS — I4891 Unspecified atrial fibrillation: Secondary | ICD-10-CM | POA: Insufficient documentation

## 2017-08-22 DIAGNOSIS — E119 Type 2 diabetes mellitus without complications: Secondary | ICD-10-CM | POA: Insufficient documentation

## 2017-08-22 DIAGNOSIS — Z79899 Other long term (current) drug therapy: Secondary | ICD-10-CM | POA: Diagnosis not present

## 2017-08-22 DIAGNOSIS — R079 Chest pain, unspecified: Secondary | ICD-10-CM | POA: Diagnosis not present

## 2017-08-22 DIAGNOSIS — Z7982 Long term (current) use of aspirin: Secondary | ICD-10-CM | POA: Insufficient documentation

## 2017-08-22 DIAGNOSIS — Z7901 Long term (current) use of anticoagulants: Secondary | ICD-10-CM | POA: Insufficient documentation

## 2017-08-22 DIAGNOSIS — Z7984 Long term (current) use of oral hypoglycemic drugs: Secondary | ICD-10-CM | POA: Insufficient documentation

## 2017-08-22 LAB — BASIC METABOLIC PANEL
Anion gap: 11 (ref 5–15)
BUN: 15 mg/dL (ref 6–20)
CO2: 23 mmol/L (ref 22–32)
Calcium: 9.3 mg/dL (ref 8.9–10.3)
Chloride: 104 mmol/L (ref 101–111)
Creatinine, Ser: 1.01 mg/dL (ref 0.61–1.24)
GFR calc Af Amer: >60 mL/min (ref >60)
GFR calc non Af Amer: >60 mL/min (ref >60)
Glucose, Bld: 255 mg/dL — ABNORMAL HIGH (ref 65–99)
Potassium: 4.3 mmol/L (ref 3.5–5.1)
Sodium: 138 mmol/L (ref 135–145)

## 2017-08-22 LAB — CBC
HCT: 44.4 % (ref 39.0–52.0)
Hemoglobin: 15 g/dL (ref 13.0–17.0)
MCH: 30.9 pg (ref 26.0–34.0)
MCHC: 33.8 g/dL (ref 30.0–36.0)
MCV: 91.4 fL (ref 78.0–100.0)
Platelets: 169 10*3/uL (ref 150–400)
RBC: 4.86 MIL/uL (ref 4.22–5.81)
RDW: 14.6 % (ref 11.5–15.5)
WBC: 9.5 10*3/uL (ref 4.0–10.5)

## 2017-08-22 LAB — I-STAT TROPONIN, ED
Troponin i, poc: 0 ng/mL (ref 0.00–0.08)
Troponin i, poc: 0 ng/mL (ref 0.00–0.08)

## 2017-08-22 NOTE — Discharge Instructions (Signed)
You were seen at [facility] ED for tightness in your chest that was likely caused by a transient episode of atrial fibrillation with rapid heart rate. Your cardiac workup was normal. The remaining of your blood tests and chest xray were also normal. Please follow up closely with your primary care provider.

## 2017-08-22 NOTE — ED Provider Notes (Signed)
[facility] DEPT Provider Note   Lucas [name] Arrival date & time: 08/22/17  0434    History   Chief Complaint Chief Complaint  Patient presents with  . Chest Pain    HPI Lucas [name] is a 61 y.o. male with history of CAD ([place] 2017 with LAD stenosis and mod disease D2 and RCA, no PCI), T2DM, HTN, HLD, pAF on Xarelto, OSA, and GERD who presents to the ED with chest tightness. Patient states he was at work when he started to experience a funny feeling in his chest that he described as chest tightness. Took his blood pressure which was 220/158 and pulse was 144 and irregular. This episode lasted for several minutes after which his BP and pulse normalizes. Denies chest pain , SOB, N/V, diaphoresis at the time. Hemodynamically stable when seen and denied active chest pain.   HPI  Past Medical History:  Diagnosis Date  . Arthritis   . CAD (coronary artery disease)    a. LHC on 01/29/16 which revealed significant apical LAD stenosis, best treated medically. There was moderate disease in the D2 and mid nondominant RCA.[name] No PCI performed   . Cataract   . Diabetes mellitus    type 2  . GERD (gastroesophageal reflux disease)   . Hyperlipidemia   . Hypertension   . Obesity   . PAF (paroxysmal atrial fibrillation) ([name]) 01/2016  . Sleep apnea    has CPAP machine but not wear    Patient Active Problem List   Diagnosis Date Noted  . Typical atrial flutter ([place])   . Obesity 05/30/2016  . Leukocytosis 05/30/2016  . AKI (acute kidney injury) ([name]) 05/30/2016  . Diabetes mellitus, type II ([name]) 01/30/2016  . CAD (coronary artery disease)   . Hypertension   . Hyperlipidemia   . Sleep apnea   . Atrial fibrillation with rapid ventricular response ([name]) 01/29/2016  . Elevated troponin     Past Surgical History:  Procedure Laterality Date  . ANKLE SURGERY Left 2012   tendon repair  . CARDIAC CATHETERIZATION [name] 01/29/2016   Procedure: Left Heart Cath and Coronary Angiography;   Surgeon: [name], MD;  Location: [facility];  Service: Cardiovascular;  Laterality: [name];  . CATARACT EXTRACTION W/PHACO  11/11/2012   Procedure: CATARACT EXTRACTION PHACO AND INTRAOCULAR LENS PLACEMENT (IOC);  Surgeon: [name], MD;  Location: [facility];  Service: Ophthalmology;  Laterality: Right;  CDE:  5.56  . CATARACT EXTRACTION W/PHACO Left 12/15/2013   Procedure: CATARACT EXTRACTION PHACO AND INTRAOCULAR LENS PLACEMENT (IOC);  Surgeon: [name], MD;  Location: [facility];  Service: Ophthalmology;  Laterality: Left;  CDE:13.36  . CHOLECYSTECTOMY    . CHONDROPLASTY Right 05/28/2017   Procedure: RIGHT KNEE CHONDROPLASTY;  Surgeon: [name], MD;  Location: [name];  Service: Orthopedics;  Laterality: Right;  . COLONOSCOPY    . CYSTOSCOPY W/ URETERAL STENT PLACEMENT     right  . ELECTROPHYSIOLOGIC STUDY [name] 06/02/2016   Procedure: A-Flutter Ablation;  Surgeon: [name], MD;  Location: [name];  Service: Cardiovascular;  Laterality: [name];  . KNEE ARTHROSCOPY WITH MEDIAL MENISECTOMY Right 05/28/2017   Procedure: KNEE ARTHROSCOPY WITH MEDIAL MENISECTOMY WITH EXTENSIVE SYNOVECTOMY;  Surgeon: [name], MD;  Location: [name];  Service: Orthopedics;  Laterality: Right;  . left knee sugery Left    Arthroscopy  . NASAL SINUS SURGERY    . POLYPECTOMY    . TONSILLECTOMY  Home Medications    Prior to Admission medications   Medication Sig Start Date End Date Taking? Authorizing Provider  acetaminophen (TYLENOL) 325 MG tablet Take 650 mg by mouth as needed.   Yes [provider]  aspirin 81 MG tablet Take 81 mg by mouth daily.   Yes [provider]  atorvastatin (LIPITOR) 80 MG tablet TAKE 1 TABLET (80 MG TOTAL) BY MOUTH DAILY AT 6 PM. 01/22/17  Yes Branch, Alphonse Guild, MD  benazepril (LOTENSIN) 20 MG tablet Take 20 mg by mouth daily.   Yes [provider]  Canagliflozin-Metformin HCl  (INVOKAMET) [phone] MG TABS Take 1 tablet by mouth 2 (two) times daily.   Yes [provider]  diltiazem (CARDIZEM) 30 MG tablet Take 1 tab 2 times daily as needed for palpitations 03/27/16  Yes Branch, Alphonse Guild, MD  glimepiride (AMARYL) 4 MG tablet Take 4 mg by mouth 2 (two) times daily.  07/25/12  Yes [provider]  isosorbide mononitrate (IMDUR) 60 MG 24 hr tablet TAKE 1 TABLET (60 MG TOTAL) BY MOUTH DAILY. 07/02/17  Yes Branch, Alphonse Guild, MD  metoprolol succinate (TOPROL-XL) 100 MG 24 hr tablet TAKE 1 TABLET BY MOUTH DAILY TAKE WITH OR IMMEDIATELY FOLLOWING A MEAL Patient taking differently: TAKE 100 mg  TABLET BY MOUTH DAILY TAKE WITH OR IMMEDIATELY FOLLOWING A MEAL 07/06/17  Yes [name], MD  nitroGLYCERIN (NITROSTAT) 0.4 MG SL tablet Place 1 tablet (0.4 mg total) under the tongue every 5 (five) minutes as needed for chest pain. 06/11/98  Yes [name], MD  ondansetron (ZOFRAN) 4 MG tablet Take 1 tablet (4 mg total) by mouth every 8 (eight) hours as needed for nausea or vomiting. 05/28/17  Yes [name], PA-C  oxyCODONE-acetaminophen (ROXICET) 5-325 MG tablet Take 1-2 tablets by mouth every 4 (four) hours as needed. 05/28/17  Yes [name], PA-C  pantoprazole (PROTONIX) 40 MG tablet Take 40 mg by mouth daily.   Yes [provider]  XARELTO 20 MG TABS tablet TAKE 1 TABLET (20 MG TOTAL) BY MOUTH DAILY WITH SUPPER. 01/22/17  Yes Branch, Alphonse Guild, MD    Family History Family History  Problem Relation Age of Onset  . Heart murmur Mother   . Aneurysm Mother   . Hypertension Mother   . Cirrhosis Father   . Heart attack Sister   . Diabetes Sister   . Aneurysm Maternal Grandmother   . Stroke Maternal Grandmother   . Heart attack Paternal Grandmother   . Stroke Sister   . Colon cancer Neg Hx   . Rectal cancer Neg Hx   . Stomach cancer Neg Hx     Social History Social History  Substance Use Topics  . Smoking status: Never Smoker  .  Smokeless tobacco: Never Used  . Alcohol use 0.0 oz/week     Comment: rare occasion     Allergies   Bee venom and Codeine   Review of Systems Review of Systems  Constitutional: Negative for chills, diaphoresis, fatigue and fever.  Respiratory: Negative for cough, chest tightness and shortness of breath.   Cardiovascular: Positive for leg swelling. Negative for chest pain and palpitations.  Gastrointestinal: Negative for abdominal pain, constipation, diarrhea, nausea and vomiting.     Physical Exam Updated Vital Signs BP (!) 162/83 (BP Location: Left Arm)   Pulse 84   Temp 98 F (36.7 C) (Oral)   Resp 18   SpO2 95%   Physical Exam  Constitutional: He  appears well-developed and well-nourished. No distress.  Cardiovascular: Normal rate, regular rhythm and normal heart sounds.  Exam reveals no gallop and no friction rub.   No murmur heard. Pulmonary/Chest: Effort normal and breath sounds normal. He has no wheezes. He has no rales. He exhibits no tenderness.  Abdominal: Soft. Bowel sounds are normal.  Musculoskeletal:  There 1+ bilateral pitting edema of lower extremities   Skin: He is not diaphoretic.     ED Treatments / Results  Labs (all labs ordered are listed, but only abnormal results are displayed) Labs Reviewed  BASIC METABOLIC PANEL - Abnormal; Notable for the following:       Result Value   Glucose, Bld 255 (*)    All other components within normal limits  CBC  I-STAT TROPONIN, ED  I-STAT TROPONIN, ED    EKG  EKG Interpretation  Date/Time:  Saturday August 22 2017 04:44:56 EDT Ventricular Rate:  87 PR Interval:  138 QRS Duration: 78 QT Interval:  356 QTC Calculation: 428 R Axis:   47 Text Interpretation:  Normal sinus rhythm Low voltage QRS No significant change since last tracing Confirmed by Blanchie Dessert [phone]) on 08/22/2017 8:28:53 AM      NSR, J-point elevation in V2, LAE, nl intervals, no signs of acute ischemia   Radiology Dg  Chest 2 View  Result Date: 08/22/2017 CLINICAL DATA:  Acute onset of central chest tightness and arrhythmia. Shortness of breath. High blood pressure. Initial encounter. EXAM: CHEST  2 VIEW COMPARISON:  Chest radiograph performed 06/14/2016 FINDINGS: The lungs are well-aerated. Mild left midlung scarring is noted. There is no evidence of focal opacification, pleural effusion or pneumothorax. The heart is normal in size; the mediastinal contour is within normal limits. No acute osseous abnormalities are seen. Chronic left-sided rib deformities are seen. IMPRESSION: Mild left midlung scarring.  Lungs otherwise clear. Electronically Signed   By: Garald Balding M.D.   On: 08/22/2017 06:17    Procedures Procedures (including critical care time)  Medications Ordered in ED Medications - No data to display   Initial Impression / Assessment and Plan / ED Course  I have reviewed the triage vital signs and the nursing notes.  Pertinent labs & imaging results that were available during my care of the patient were reviewed by me and considered in my medical decision making (see chart for details).    CHASTEN BLAZE is a 61 y.o. male with history of CAD ([place] 2017 with LAD stenosis and mod disease D2 and RCA, no PCI), T2DM, HTN, HLD, pAF on Xarelto, OSA, and GERD who presents to the ED with chest tightness associated with HTN and irregular pulse. Patient is hemodynamically stable and RRR on exam. EKG shows NSR with no signs of acute ischemia. Initial troponin negative and CXR negative. Suspect patient had a transient episode of atrial fibrillation with RVR that has now resolved. He is currently doing well, hemodynamically stable, and with no complaints. Low suspicion for ACS at this time. HEART score 4. Given his extensive cardiac history, would like to obtain a second troponin. If negative, will plan to d/c home and advise close follow up with PCP.   [phone] Troponin negative x2. Will d/c home. Advised patient  to follow up with PCP.  Final Clinical Impressions(s) / ED Diagnoses   Final diagnoses:  Chest pain, unspecified type    New Prescriptions New Prescriptions   No medications on file     Welford Roche, MD 08/22/17 [phone]  Blanchie Dessert, MD 08/23/17 [phone]

## 2017-08-22 NOTE — ED Triage Notes (Signed)
Pt states he was at work eating around 3 am and started having tightness in center of chest and felt heart rhythm was abnormal- history of afib.  Brief period of sob during that time.  Pain has now resolved.  States he took BP and it was 222/158 and HR 144.

## 2017-08-27 ENCOUNTER — Ambulatory Visit (INDEPENDENT_AMBULATORY_CARE_PROVIDER_SITE_OTHER): Payer: 59 | Admitting: Cardiology

## 2017-08-27 ENCOUNTER — Encounter: Payer: Self-pay | Admitting: Cardiology

## 2017-08-27 VITALS — BP 118/68 | HR 64 | Ht 70.0 in | Wt 273.0 lb

## 2017-08-27 DIAGNOSIS — I2510 Atherosclerotic heart disease of native coronary artery without angina pectoris: Secondary | ICD-10-CM

## 2017-08-27 DIAGNOSIS — I10 Essential (primary) hypertension: Secondary | ICD-10-CM

## 2017-08-27 DIAGNOSIS — I480 Paroxysmal atrial fibrillation: Secondary | ICD-10-CM

## 2017-08-27 DIAGNOSIS — I4892 Unspecified atrial flutter: Secondary | ICD-10-CM | POA: Diagnosis not present

## 2017-08-27 MED ORDER — FUROSEMIDE 20 MG PO TABS: 20.0000 mg | ORAL_TABLET | Freq: Every day | ORAL | 3 refills | Status: DC | PRN

## 2017-08-27 NOTE — Patient Instructions (Signed)
Medication Instructions:  START LASIX 20 MG DAILY AS NEEDED FOR SWELLING   Labwork: NONE  Testing/Procedures: NONE  Follow-Up: Your physician recommends that you schedule a follow-up appointment in: 3 MONTHS    Any Other Special Instructions Will Be Listed Below (If Applicable).   DR. Luan Pulling [phone]  If you need a refill on your cardiac medications before your next appointment, please call your pharmacy.

## 2017-08-27 NOTE — Progress Notes (Signed)
Clinical Summary Lucas [name] is a 61 y.o.male seen today for follow up of the following medical problems.   1. Aflutter - CHADS2Vasc score of 2, on xarelto - s/p aflutter ablation 05/2016 by Dr Curt Bears.    - ER visit 08/2017 with palpitations and chest pain. Checked pulse at the time and heart rates 140s, spontaneously converted - episode lasted about 30 minutes - similar to prior episodes of afib.   2. CAD - previous chest pain episode occurred in the setting of afib with RVR - cath 01/29/16 with apical LAD disease, treated medically. Otherwise diffuse mild to moderate disease  - no exertional chest pain since last visit  3. HTN - he is compliant with meds - home bp's < 130/80s  4. OSA - followed Dr Luan Pulling.  - poor compliance with cpap.   5. Hyperlipidemia - compliant with statin   Past Medical History:  Diagnosis Date  . Arthritis   . CAD (coronary artery disease)    a. LHC on 01/29/16 which revealed significant apical LAD stenosis, best treated medically. There was moderate disease in the D2 and mid nondominant RCA.[name] No PCI performed   . Cataract   . Diabetes mellitus    type 2  . GERD (gastroesophageal reflux disease)   . Hyperlipidemia   . Hypertension   . Obesity   . PAF (paroxysmal atrial fibrillation) ([place]) 01/2016  . Sleep apnea    has CPAP machine but not wear     Allergies  Allergen Reactions  . Bee Venom Swelling  . Codeine Nausea Only     Current Outpatient Prescriptions  Medication Sig Dispense Refill  . acetaminophen (TYLENOL) 325 MG tablet Take 650 mg by mouth as needed.    [name] aspirin 81 MG tablet Take 81 mg by mouth daily.    [name] atorvastatin (LIPITOR) 80 MG tablet TAKE 1 TABLET (80 MG TOTAL) BY MOUTH DAILY AT 6 PM. 90 tablet 3  . benazepril (LOTENSIN) 20 MG tablet Take 20 mg by mouth daily.    . Canagliflozin-Metformin HCl (INVOKAMET) [phone] MG TABS Take 1 tablet by mouth 2 (two) times daily.    [name] diltiazem (CARDIZEM) 30 MG  tablet Take 1 tab 2 times daily as needed for palpitations 180 tablet 3  . glimepiride (AMARYL) 4 MG tablet Take 4 mg by mouth 2 (two) times daily.     . isosorbide mononitrate (IMDUR) 60 MG 24 hr tablet TAKE 1 TABLET (60 MG TOTAL) BY MOUTH DAILY. 90 tablet 3  . metoprolol succinate (TOPROL-XL) 100 MG 24 hr tablet TAKE 1 TABLET BY MOUTH DAILY TAKE WITH OR IMMEDIATELY FOLLOWING A MEAL (Patient taking differently: TAKE 100 mg  TABLET BY MOUTH DAILY TAKE WITH OR IMMEDIATELY FOLLOWING A MEAL) 90 tablet 3  . nitroGLYCERIN (NITROSTAT) 0.4 MG SL tablet Place 1 tablet (0.4 mg total) under the tongue every 5 (five) minutes as needed for chest pain. 30 tablet 0  . ondansetron (ZOFRAN) 4 MG tablet Take 1 tablet (4 mg total) by mouth every 8 (eight) hours as needed for nausea or vomiting. 40 tablet 0  . oxyCODONE-acetaminophen (ROXICET) 5-325 MG tablet Take 1-2 tablets by mouth every 4 (four) hours as needed. 60 tablet 0  . pantoprazole (PROTONIX) 40 MG tablet Take 40 mg by mouth daily.    [name] 20 MG TABS tablet TAKE 1 TABLET (20 MG TOTAL) BY MOUTH DAILY WITH SUPPER. 90 tablet 3   No current facility-administered medications for this visit.  Past Surgical History:  Procedure Laterality Date  . ANKLE SURGERY Left 2012   tendon repair  . CARDIAC CATHETERIZATION [name] 01/29/2016   Procedure: Left Heart Cath and Coronary Angiography;  Surgeon: [name], MD;  Location: [facility];  Service: Cardiovascular;  Laterality: [name];  . CATARACT EXTRACTION W/PHACO  11/11/2012   Procedure: CATARACT EXTRACTION PHACO AND INTRAOCULAR LENS PLACEMENT (IOC);  Surgeon: Tonny Lakelyn Straus, MD;  Location: [facility];  Service: Ophthalmology;  Laterality: Right;  CDE:  5.56  . CATARACT EXTRACTION W/PHACO Left 12/15/2013   Procedure: CATARACT EXTRACTION PHACO AND INTRAOCULAR LENS PLACEMENT (IOC);  Surgeon: Tonny Edman Lipsey, MD;  Location: [facility];  Service: Ophthalmology;  Laterality: Left;  CDE:13.36  . CHOLECYSTECTOMY    .  CHONDROPLASTY Right 05/28/2017   Procedure: RIGHT KNEE CHONDROPLASTY;  Surgeon: [name], MD;  Location: [facility];  Service: Orthopedics;  Laterality: Right;  . COLONOSCOPY    . CYSTOSCOPY W/ URETERAL STENT PLACEMENT     right  . ELECTROPHYSIOLOGIC STUDY [name] 06/02/2016   Procedure: A-Flutter Ablation;  Surgeon: [name], MD;  Location: [facility];  Service: Cardiovascular;  Laterality: [name];  . KNEE ARTHROSCOPY WITH MEDIAL MENISECTOMY Right 05/28/2017   Procedure: KNEE ARTHROSCOPY WITH MEDIAL MENISECTOMY WITH EXTENSIVE SYNOVECTOMY;  Surgeon: [name], MD;  Location: [facility];  Service: Orthopedics;  Laterality: Right;  . left knee sugery Left    Arthroscopy  . NASAL SINUS SURGERY    . POLYPECTOMY    . TONSILLECTOMY       Allergies  Allergen Reactions  . Bee Venom Swelling  . Codeine Nausea Only      Family History  Problem Relation Age of Onset  . Heart murmur Mother   . Aneurysm Mother   . Hypertension Mother   . Cirrhosis Father   . Heart attack Sister   . Diabetes Sister   . Aneurysm Maternal Grandmother   . Stroke Maternal Grandmother   . Heart attack Paternal Grandmother   . Stroke Sister   . Colon cancer Neg Hx   . Rectal cancer Neg Hx   . Stomach cancer Neg Hx      Social History Lucas [name] reports that he has never smoked. He has never used smokeless tobacco. Lucas [name] reports that he drinks alcohol.   Review of Systems CONSTITUTIONAL: No weight loss, fever, chills, weakness or fatigue.  HEENT: Eyes: No visual loss, blurred vision, double vision or yellow sclerae.No hearing loss, sneezing, congestion, runny nose or sore throat.  SKIN: No rash or itching.  CARDIOVASCULAR: per hpi RESPIRATORY: per hpi GASTROINTESTINAL: No anorexia, nausea, vomiting or diarrhea. No abdominal pain or blood.  GENITOURINARY: No burning on urination, no polyuria NEUROLOGICAL: No headache, dizziness, syncope,  paralysis, ataxia, numbness or tingling in the extremities. No change in bowel or bladder control.  MUSCULOSKELETAL: No muscle, back pain, joint pain or stiffness.  LYMPHATICS: No enlarged nodes. No history of splenectomy.  PSYCHIATRIC: No history of depression or anxiety.  ENDOCRINOLOGIC: No reports of sweating, cold or heat intolerance. No polyuria or polydipsia.  [name]   Physical Examination Vitals:   08/27/17 1119  BP: 118/68  Pulse: 64  SpO2: 95%   Vitals:   08/27/17 1119  Weight: 273 lb (123.8 kg)  Height: 5\' 10"  (1.778 m)    Gen: resting comfortably, no acute distress HEENT: no scleral icterus, pupils equal round and reactive, no palptable cervical adenopathy,  CV: RRR, no m/rg,  no jvd Resp: Clear to auscultation bilaterally GI: abdomen is soft, non-tender, non-distended, normal bowel sounds, no hepatosplenomegaly MSK: extremities are warm, 1+ bilateral LE edema Skin: warm, no rash Neuro:  no focal deficits Psych: appropriate affect   Diagnostic Studies 01/2016 echo Study Conclusions  - Left ventricle: The cavity size was mildly dilated. Wall  thickness was increased in a pattern of mild LVH. Systolic  function was normal. The estimated ejection fraction was in the  range of 55% to 60%.  01/2016 cath 1. Dist LAD lesion, 80% stenosed. 2. Mid RCA lesion, 60% stenosed. 3. Ost 2nd Diag to 2nd Diag lesion, 60% stenosed. 4. 2nd Mrg lesion, 30% stenosed. 5. Mid LAD lesion, 25% stenosed. 6. LPDA lesion, 45% stenosed.   Significant apical LAD stenosis, best treated with medication. Moderate disease in the second diagonal and mid nondominant RCA.  Normal left ventricular systolic function. Normal left ventricular filling pressures.  Post Cath Recommendations:   Medical therapy of both atrial fibrillation and coronary artery disease. Percutaneous intervention is not currently indicated for coronary disease.     Assessment and Plan  1. Aflutter/Afib -  s/p aflutter ablation,continues to have paroxysmal afib at times including recent episodes - encouraged use of his prn dilt which he has not been using. If recurrences may need to increase his daily beta blocker - continue anticoag  2. CAD - cath with distal apical disease, otherwise moderate disease - no recent exertional chest pain, conitnue current meds  3. HTN - at goal, conitnue current meds  4. OSA - encouraged increased compliance  5. LE edema - encouraged low sodium diet, start lasix 20mg  prn  F/u 3 months      Arnoldo Lenis, M.D

## 2017-09-07 ENCOUNTER — Encounter: Payer: Self-pay | Admitting: Gastroenterology

## 2017-10-23 ENCOUNTER — Encounter: Payer: Self-pay | Admitting: Gastroenterology

## 2017-12-08 ENCOUNTER — Ambulatory Visit: Payer: 59 | Admitting: Cardiology

## 2017-12-08 ENCOUNTER — Encounter: Payer: Self-pay | Admitting: Cardiology

## 2017-12-08 VITALS — BP 136/72 | HR 67 | Ht 70.0 in | Wt 274.0 lb

## 2017-12-08 DIAGNOSIS — I2510 Atherosclerotic heart disease of native coronary artery without angina pectoris: Secondary | ICD-10-CM

## 2017-12-08 DIAGNOSIS — I10 Essential (primary) hypertension: Secondary | ICD-10-CM

## 2017-12-08 DIAGNOSIS — I480 Paroxysmal atrial fibrillation: Secondary | ICD-10-CM

## 2017-12-08 NOTE — Patient Instructions (Signed)
Medication Instructions:  Your physician recommends that you continue on your current medications as directed. Please refer to the Current Medication list given to you today.  Labwork: NONE  Testing/Procedures: NONE  Follow-Up: Your physician recommends that you schedule a follow-up appointment with DR. HAWKINS for your sleep apnea.   Your physician wants you to follow-up in: 6 months.  You will receive a reminder letter in the mail two months in advance. If you don't receive a letter, please call our office to schedule the follow-up appointment.    Any Other Special Instructions Will Be Listed Below (If Applicable).     If you need a refill on your cardiac medications before your next appointment, please call your pharmacy.

## 2017-12-08 NOTE — Progress Notes (Signed)
Clinical Summary Lucas [name] is a 62 y.o.male  seen today for follow up of the following medical problems.   1. Aflutter/Afib - CHADS2Vasc score of 2, on xarelto - s/p aflutter ablation 05/2016 by Dr Curt Bears.    - isolated episode of palpitations since last visit selft terminated - no bleeding on xarelto  2. CAD - previous chest pain episode occurred in the setting of afib with RVR - cath 01/29/16 with apical LAD disease, treated medically. Otherwise diffuse mild to moderate disease  - no recent chest pain, no SOB   3. HTN - compliant with meds  -he reports recent  home bp's < 130/80  4. OSA - followed Dr Luan Pulling.  - poor compliance with cpap.   5. Hyperlipidemia -he is compliant with statin  6. Leg pain - bilateral calf pain, mainly occurs at night. - no claudication like pain   Past Medical History:  Diagnosis Date  . Arthritis   . CAD (coronary artery disease)    a. LHC on 01/29/16 which revealed significant apical LAD stenosis, best treated medically. There was moderate disease in the D2 and mid nondominant RCA.[name] No PCI performed   . Cataract   . Diabetes mellitus    type 2  . GERD (gastroesophageal reflux disease)   . Hyperlipidemia   . Hypertension   . Obesity   . PAF (paroxysmal atrial fibrillation) ([place]) 01/2016  . Sleep apnea    has CPAP machine but not wear  . Tubular adenoma of colon 08/2012     Allergies  Allergen Reactions  . Bee Venom Swelling  . Codeine Nausea Only     Current Outpatient Medications  Medication Sig Dispense Refill  . acetaminophen (TYLENOL) 325 MG tablet Take 650 mg by mouth as needed.    [name] aspirin 81 MG tablet Take 81 mg by mouth daily.    [name] atorvastatin (LIPITOR) 80 MG tablet TAKE 1 TABLET (80 MG TOTAL) BY MOUTH DAILY AT 6 PM. 90 tablet 3  . benazepril (LOTENSIN) 20 MG tablet Take 20 mg by mouth daily.    . Canagliflozin-Metformin HCl (INVOKAMET) [phone] MG TABS Take 1 tablet by mouth 2 (two) times daily.     [name] diltiazem (CARDIZEM) 30 MG tablet Take 1 tab 2 times daily as needed for palpitations 180 tablet 3  . furosemide (LASIX) 20 MG tablet Take 1 tablet (20 mg total) by mouth daily as needed. 90 tablet 3  . glimepiride (AMARYL) 4 MG tablet Take 4 mg by mouth 2 (two) times daily.     . isosorbide mononitrate (IMDUR) 60 MG 24 hr tablet TAKE 1 TABLET (60 MG TOTAL) BY MOUTH DAILY. 90 tablet 3  . metoprolol succinate (TOPROL-XL) 100 MG 24 hr tablet TAKE 1 TABLET BY MOUTH DAILY TAKE WITH OR IMMEDIATELY FOLLOWING A MEAL (Patient taking differently: TAKE 100 mg  TABLET BY MOUTH DAILY TAKE WITH OR IMMEDIATELY FOLLOWING A MEAL) 90 tablet 3  . nitroGLYCERIN (NITROSTAT) 0.4 MG SL tablet Place 1 tablet (0.4 mg total) under the tongue every 5 (five) minutes as needed for chest pain. 30 tablet 0  . ondansetron (ZOFRAN) 4 MG tablet Take 1 tablet (4 mg total) by mouth every 8 (eight) hours as needed for nausea or vomiting. 40 tablet 0  . oxyCODONE-acetaminophen (ROXICET) 5-325 MG tablet Take 1-2 tablets by mouth every 4 (four) hours as needed. 60 tablet 0  . pantoprazole (PROTONIX) 40 MG tablet Take 40 mg by mouth daily.    [name]  XARELTO 20 MG TABS tablet TAKE 1 TABLET (20 MG TOTAL) BY MOUTH DAILY WITH SUPPER. 90 tablet 3   No current facility-administered medications for this visit.      Past Surgical History:  Procedure Laterality Date  . ANKLE SURGERY Left 2012   tendon repair  . CARDIAC CATHETERIZATION [name] 01/29/2016   Procedure: Left Heart Cath and Coronary Angiography;  Surgeon: [name], MD;  Location: [facility];  Service: Cardiovascular;  Laterality: [name];  . CATARACT EXTRACTION W/PHACO  11/11/2012   Procedure: CATARACT EXTRACTION PHACO AND INTRAOCULAR LENS PLACEMENT (IOC);  Surgeon: Tonny Delmos Velaquez, MD;  Location: [facility];  Service: Ophthalmology;  Laterality: Right;  CDE:  5.56  . CATARACT EXTRACTION W/PHACO Left 12/15/2013   Procedure: CATARACT EXTRACTION PHACO AND INTRAOCULAR LENS PLACEMENT (IOC);   Surgeon: Tonny Naly Schwanz, MD;  Location: [facility];  Service: Ophthalmology;  Laterality: Left;  CDE:13.36  . CHOLECYSTECTOMY    . CHONDROPLASTY Right 05/28/2017   Procedure: RIGHT KNEE CHONDROPLASTY;  Surgeon: [name], MD;  Location: [place];  Service: Orthopedics;  Laterality: Right;  . COLONOSCOPY    . CYSTOSCOPY W/ URETERAL STENT PLACEMENT     right  . ELECTROPHYSIOLOGIC STUDY [name] 06/02/2016   Procedure: A-Flutter Ablation;  Surgeon: [name], MD;  Location: [facility];  Service: Cardiovascular;  Laterality: [name];  . KNEE ARTHROSCOPY WITH MEDIAL MENISECTOMY Right 05/28/2017   Procedure: KNEE ARTHROSCOPY WITH MEDIAL MENISECTOMY WITH EXTENSIVE SYNOVECTOMY;  Surgeon: [name], MD;  Location: [place];  Service: Orthopedics;  Laterality: Right;  . left knee sugery Left    Arthroscopy  . NASAL SINUS SURGERY    . POLYPECTOMY    . TONSILLECTOMY       Allergies  Allergen Reactions  . Bee Venom Swelling  . Codeine Nausea Only      Family History  Problem Relation Age of Onset  . Heart murmur Mother   . Aneurysm Mother   . Hypertension Mother   . Cirrhosis Father   . Heart attack Sister   . Diabetes Sister   . Aneurysm Maternal Grandmother   . Stroke Maternal Grandmother   . Heart attack Paternal Grandmother   . Stroke Sister   . Colon cancer Neg Hx   . Rectal cancer Neg Hx   . Stomach cancer Neg Hx      Social History Lucas [name] reports that  has never smoked. he has never used smokeless tobacco. Lucas [name] reports that he drinks alcohol.   Review of Systems CONSTITUTIONAL: No weight loss, fever, chills, weakness or fatigue.  HEENT: Eyes: No visual loss, blurred vision, double vision or yellow sclerae.No hearing loss, sneezing, congestion, runny nose or sore throat.  SKIN: No rash or itching.  CARDIOVASCULAR: per hpi RESPIRATORY: No shortness of breath, cough or sputum.  GASTROINTESTINAL: No anorexia, nausea,  vomiting or diarrhea. No abdominal pain or blood.  GENITOURINARY: No burning on urination, no polyuria NEUROLOGICAL: No headache, dizziness, syncope, paralysis, ataxia, numbness or tingling in the extremities. No change in bowel or bladder control.  MUSCULOSKELETAL: per hpi LYMPHATICS: No enlarged nodes. No history of splenectomy.  PSYCHIATRIC: No history of depression or anxiety.  ENDOCRINOLOGIC: No reports of sweating, cold or heat intolerance. No polyuria or polydipsia.  [name]   Physical Examination Vitals:   12/08/17 1012  BP: 136/72  Pulse: 67  SpO2: 97%   Vitals:   12/08/17 1012  Weight: 274 lb (124.3 kg)  Height: 5\' 10"  (1.778 m)    Gen: resting comfortably, no acute distress HEENT: no scleral icterus, pupils equal round and reactive, no palptable cervical adenopathy,  CV: RRR, no mr/g, no jvd Resp: Clear to auscultation bilaterally GI: abdomen is soft, non-tender, non-distended, normal bowel sounds, no hepatosplenomegaly MSK: extremities are warm, no edema.  Skin: warm, no rash Neuro:  no focal deficits Psych: appropriate affect   Diagnostic Studies  01/2016 echo Study Conclusions  - Left ventricle: The cavity size was mildly dilated. Wall  thickness was increased in a pattern of mild LVH. Systolic  function was normal. The estimated ejection fraction was in the  range of 55% to 60%.  01/2016 cath 1. Dist LAD lesion, 80% stenosed. 2. Mid RCA lesion, 60% stenosed. 3. Ost 2nd Diag to 2nd Diag lesion, 60% stenosed. 4. 2nd Mrg lesion, 30% stenosed. 5. Mid LAD lesion, 25% stenosed. 6. LPDA lesion, 45% stenosed.   Significant apical LAD stenosis, best treated with medication. Moderate disease in the second diagonal and mid nondominant RCA.  Normal left ventricular systolic function. Normal left ventricular filling pressures.  Post Cath Recommendations:   Medical therapy of both atrial fibrillation and coronary artery disease. Percutaneous intervention  is not currently indicated for coronary disease.   Assessment and Plan   1. Aflutter/Afib - s/p aflutter ablation,continues to have paroxysmal afib at times  - overall mild infrequent symptoms, continue current meds  2. CAD - cath with distal apical disease, otherwise moderate disease - no recent symptoms, continue current meds  3. HTN - bp at goal, continue current meds  4. OSA - encouraged increased compliance - he will f/u with Dr Luan Pulling   Request labs from pcp F/u 6 months    Arnoldo Lenis, M.D.

## 2017-12-09 ENCOUNTER — Ambulatory Visit: Payer: 59 | Admitting: Gastroenterology

## 2017-12-09 ENCOUNTER — Telehealth: Payer: Self-pay

## 2017-12-09 ENCOUNTER — Encounter: Payer: Self-pay | Admitting: Gastroenterology

## 2017-12-09 VITALS — BP 110/62 | HR 76 | Ht 70.0 in | Wt 274.4 lb

## 2017-12-09 DIAGNOSIS — Z86010 Personal history of colonic polyps: Secondary | ICD-10-CM

## 2017-12-09 DIAGNOSIS — Z7901 Long term (current) use of anticoagulants: Secondary | ICD-10-CM | POA: Diagnosis not present

## 2017-12-09 MED ORDER — NA SULFATE-K SULFATE-MG SULF 17.5-3.13-1.6 GM/177ML PO SOLN: 1.0000 | Freq: Once | ORAL | 0 refills | Status: AC

## 2017-12-09 NOTE — Telephone Encounter (Signed)
   Lucas [name] [birth year] [id number]  Dear Dr. Harl Bowie:  We have scheduled the above named patient for a(n) Colonoscopy procedure. Our records show that (s)he is on anticoagulation therapy.  Please advise as to whether the patient may come off their therapy of Xarelto 2 days prior to their procedure which is scheduled for 12/22/17.  Please route your response to Marlon Pel, CMA or fax response to [phone].  Sincerely,    [facility]

## 2017-12-09 NOTE — Patient Instructions (Addendum)
You have been scheduled for a colonoscopy. Please follow written instructions given to you at your visit today.  Please pick up your prep supplies at the pharmacy within the next 1-3 days. If you use inhalers (even only as needed), please bring them with you on the day of your procedure. Your physician has requested that you go to www.startemmi.com and enter the access code given to you at your visit today. This web site gives a general overview about your procedure. However, you should still follow specific instructions given to you by our office regarding your preparation for the procedure.  HOLD ORAL DIABETIC MEDICATIONS THE MORNING OF YOUR PROCEDURE!  Normal BMI (Body Mass Index- based on height and weight) is between 19 and 25. Your BMI today is Body mass index is 39.37 kg/m. [name] Please consider follow up  regarding your BMI with your Primary Care Provider.  Thank you for choosing me and [facility].  Pricilla Riffle. Dagoberto Ligas., MD., [facility]

## 2017-12-09 NOTE — Progress Notes (Signed)
History of Present Illness: This is a 62 year old here for the evaluation of a history of colon polyps on anticoagulation. He is accompanied by his wife. He underwent colonoscopy in 08/2012 with 2 adenomatous polyps removed. He is maintained on Xarelto for afib. He has no GI complaints. Denies weight loss, abdominal pain, constipation, diarrhea, change in stool caliber, melena, hematochezia, nausea, vomiting, dysphagia, reflux symptoms, chest pain.   Allergies  Allergen Reactions  . Bee Venom Swelling  . Codeine Nausea Only   Outpatient Medications Prior to Visit  Medication Sig Dispense Refill  . acetaminophen (TYLENOL) 325 MG tablet Take 650 mg by mouth as needed.    [name] aspirin 81 MG tablet Take 81 mg by mouth daily.    [name] atorvastatin (LIPITOR) 80 MG tablet TAKE 1 TABLET (80 MG TOTAL) BY MOUTH DAILY AT 6 PM. 90 tablet 3  . benazepril (LOTENSIN) 20 MG tablet Take 20 mg by mouth daily.    . Canagliflozin-Metformin HCl (INVOKAMET) [phone] MG TABS Take 1 tablet by mouth 2 (two) times daily.    [name] diltiazem (CARDIZEM) 30 MG tablet Take 1 tab 2 times daily as needed for palpitations 180 tablet 3  . glimepiride (AMARYL) 4 MG tablet Take 4 mg by mouth 2 (two) times daily.     . isosorbide mononitrate (IMDUR) 60 MG 24 hr tablet TAKE 1 TABLET (60 MG TOTAL) BY MOUTH DAILY. 90 tablet 3  . metoprolol succinate (TOPROL-XL) 100 MG 24 hr tablet TAKE 1 TABLET BY MOUTH DAILY TAKE WITH OR IMMEDIATELY FOLLOWING A MEAL (Patient taking differently: TAKE 100 mg  TABLET BY MOUTH DAILY TAKE WITH OR IMMEDIATELY FOLLOWING A MEAL) 90 tablet 3  . nitroGLYCERIN (NITROSTAT) 0.4 MG SL tablet Place 1 tablet (0.4 mg total) under the tongue every 5 (five) minutes as needed for chest pain. 30 tablet 0  . oxyCODONE-acetaminophen (ROXICET) 5-325 MG tablet Take 1-2 tablets by mouth every 4 (four) hours as needed. 60 tablet 0  . pantoprazole (PROTONIX) 40 MG tablet Take 40 mg by mouth daily.    [name] 20 MG TABS tablet TAKE  1 TABLET (20 MG TOTAL) BY MOUTH DAILY WITH SUPPER. 90 tablet 3  . furosemide (LASIX) 20 MG tablet Take 1 tablet (20 mg total) by mouth daily as needed. 90 tablet 3  . ondansetron (ZOFRAN) 4 MG tablet Take 1 tablet (4 mg total) by mouth every 8 (eight) hours as needed for nausea or vomiting. 40 tablet 0   No facility-administered medications prior to visit.    Past Medical History:  Diagnosis Date  . Arthritis   . CAD (coronary artery disease)    a. LHC on 01/29/16 which revealed significant apical LAD stenosis, best treated medically. There was moderate disease in the D2 and mid nondominant RCA.[name] No PCI performed   . Cataract   . Diabetes mellitus    type 2  . GERD (gastroesophageal reflux disease)   . Hyperlipidemia   . Hypertension   . Obesity   . PAF (paroxysmal atrial fibrillation) ([place]) 01/2016  . Sleep apnea    has CPAP machine but not wear  . Tubular adenoma of colon 08/2012   Past Surgical History:  Procedure Laterality Date  . ANKLE SURGERY Left 2012   tendon repair  . CARDIAC CATHETERIZATION [name] 01/29/2016   Procedure: Left Heart Cath and Coronary Angiography;  Surgeon: [name], MD;  Location: [place] [facility];  Service: Cardiovascular;  Laterality: [name];  . CATARACT  EXTRACTION W/PHACO  11/11/2012   Procedure: CATARACT EXTRACTION PHACO AND INTRAOCULAR LENS PLACEMENT (IOC);  Surgeon: [name], MD;  Location: [facility];  Service: Ophthalmology;  Laterality: Right;  CDE:  5.56  . CATARACT EXTRACTION W/PHACO Left 12/15/2013   Procedure: CATARACT EXTRACTION PHACO AND INTRAOCULAR LENS PLACEMENT (IOC);  Surgeon: [name], MD;  Location: [facility];  Service: Ophthalmology;  Laterality: Left;  CDE:13.36  . CHOLECYSTECTOMY    . CHONDROPLASTY Right 05/28/2017   Procedure: RIGHT KNEE CHONDROPLASTY;  Surgeon: [name], MD;  Location: [place];  Service: Orthopedics;  Laterality: Right;  . COLONOSCOPY    . CYSTOSCOPY W/ URETERAL STENT PLACEMENT     right  .  ELECTROPHYSIOLOGIC STUDY [name] 06/02/2016   Procedure: A-Flutter Ablation;  Surgeon: [name], MD;  Location: [place] [facility];  Service: Cardiovascular;  Laterality: [name];  . KNEE ARTHROSCOPY WITH MEDIAL MENISECTOMY Right 05/28/2017   Procedure: KNEE ARTHROSCOPY WITH MEDIAL MENISECTOMY WITH EXTENSIVE SYNOVECTOMY;  Surgeon: [name], MD;  Location: [place];  Service: Orthopedics;  Laterality: Right;  . left knee sugery Left    Arthroscopy  . NASAL SINUS SURGERY    . POLYPECTOMY    . TONSILLECTOMY     Social History   Socioeconomic History  . Marital status: Married    Spouse name: None  . Number of children: None  . Years of education: None  . Highest education level: None  Social Needs  . Financial resource strain: None  . Food insecurity - worry: None  . Food insecurity - inability: None  . Transportation needs - medical: None  . Transportation needs - non-medical: None  Occupational History  . None  Tobacco Use  . Smoking status: Never Smoker  . Smokeless tobacco: Never Used  Substance and Sexual Activity  . Alcohol use: Yes    Alcohol/week: 0.0 oz    Comment: rare occasion  . Drug use: No  . Sexual activity: Yes    Birth control/protection: None  Other Topics Concern  . None  Social History Narrative  . None   Family History  Problem Relation Age of Onset  . Heart murmur Mother   . Aneurysm Mother   . Hypertension Mother   . Cirrhosis Father   . Heart attack Sister   . Diabetes Sister   . Aneurysm Maternal Grandmother   . Stroke Maternal Grandmother   . Heart attack Paternal Grandmother   . Stroke Sister   . Colon cancer Neg Hx   . Rectal cancer Neg Hx   . Stomach cancer Neg Hx        Review of Systems: Pertinent positive and negative review of systems were noted in the above HPI section. All other review of systems were otherwise negative.    Physical Exam: General: Well developed, well nourished, no acute  distress Head: Normocephalic and atraumatic Eyes:  sclerae anicteric, EOMI Ears: Normal auditory acuity Mouth: No deformity or lesions Neck: Supple, no masses or thyromegaly Lungs: Clear throughout to auscultation Heart: Regular rate and rhythm; no murmurs, rubs or bruits Abdomen: Soft, non tender and non distended. No masses, hepatosplenomegaly or hernias noted. Normal Bowel sounds Rectal: deferred to colonoscopy Musculoskeletal: Symmetrical with no gross deformities  Skin: No lesions on visible extremities Pulses:  Normal pulses noted Extremities: No clubbing, cyanosis, edema or deformities noted Neurological: Alert oriented x 4, grossly nonfocal Cervical Nodes:  No significant cervical adenopathy Inguinal Nodes: No significant inguinal adenopathy Psychological:  Alert and cooperative. Normal mood and affect  Assessment and Recommendations:  1. Personal history of adenomatous colon polyps due for surveillance colonoscopy. Schedule colonoscopy. The risks (including bleeding, perforation, infection, missed lesions, medication reactions and possible hospitalization or surgery if complications occur), benefits, and alternatives to colonoscopy with possible biopsy and possible polypectomy were discussed with the patient and they consent to proceed.   2. Afib. Hold Xarelto 2 days before procedure - will instruct when and how to resume after procedure. Low but real risk of cardiovascular event such as heart attack, stroke, embolism, thrombosis or ischemia/infarct of other organs off Xarelto explained and need to seek urgent help if this occurs. The patient consents to proceed. Will communicate by phone or EMR with patient's prescribing provider to confirm that holding Xarelto is reasonable in this case.

## 2017-12-10 ENCOUNTER — Encounter: Payer: Self-pay | Admitting: Cardiology

## 2017-12-10 NOTE — Telephone Encounter (Signed)
Pt takes Xarelto for afib with CHADS2VASc score of 3 (CAD, HTN, DM). Renal function is normal. Ok to hold Xarelto for 2 days prior.

## 2017-12-10 NOTE — Telephone Encounter (Signed)
Informed patient to hold Xarelto 2 days prior to his procedure. Patient verbalized understanding.

## 2017-12-22 ENCOUNTER — Ambulatory Visit (AMBULATORY_SURGERY_CENTER): Payer: 59 | Admitting: Gastroenterology

## 2017-12-22 ENCOUNTER — Other Ambulatory Visit: Payer: Self-pay

## 2017-12-22 ENCOUNTER — Encounter: Payer: Self-pay | Admitting: Gastroenterology

## 2017-12-22 VITALS — BP 126/75 | HR 57 | Temp 98.0°F | Resp 17 | Ht 70.0 in | Wt 274.0 lb

## 2017-12-22 DIAGNOSIS — K635 Polyp of colon: Secondary | ICD-10-CM | POA: Diagnosis not present

## 2017-12-22 DIAGNOSIS — D126 Benign neoplasm of colon, unspecified: Secondary | ICD-10-CM

## 2017-12-22 DIAGNOSIS — Z86010 Personal history of colonic polyps: Secondary | ICD-10-CM | POA: Diagnosis not present

## 2017-12-22 DIAGNOSIS — D124 Benign neoplasm of descending colon: Secondary | ICD-10-CM | POA: Diagnosis not present

## 2017-12-22 DIAGNOSIS — D123 Benign neoplasm of transverse colon: Secondary | ICD-10-CM

## 2017-12-22 MED ORDER — SODIUM CHLORIDE 0.9 % IV SOLN: 500.0000 mL | Freq: Once | INTRAVENOUS | Status: DC

## 2017-12-22 NOTE — Progress Notes (Signed)
Pt's states no medical or surgical changes since previsit or office visit. 

## 2017-12-22 NOTE — Progress Notes (Signed)
Called to room to assist during endoscopic procedure.  Patient ID and intended procedure confirmed with present staff. Received instructions for my participation in the procedure from the performing physician.  

## 2017-12-22 NOTE — Patient Instructions (Signed)
YOU HAD AN ENDOSCOPIC PROCEDURE TODAY AT [facility]:   Refer to the procedure report that was given to you for any specific questions about what was found during the examination.  If the procedure report does not answer your questions, please call your gastroenterologist to clarify.  If you requested that your care partner not be given the details of your procedure findings, then the procedure report has been included in a sealed envelope for you to review at your convenience later.  YOU SHOULD EXPECT: Some feelings of bloating in the abdomen. Passage of more gas than usual.  Walking can help get rid of the air that was put into your GI tract during the procedure and reduce the bloating. If you had a lower endoscopy (such as a colonoscopy or flexible sigmoidoscopy) you may notice spotting of blood in your stool or on the toilet paper. If you underwent a bowel prep for your procedure, you may not have a normal bowel movement for a few days.  Please Note:  You might notice some irritation and congestion in your nose or some drainage.  This is from the oxygen used during your procedure.  There is no need for concern and it should clear up in a day or so.  SYMPTOMS TO REPORT IMMEDIATELY:   Following lower endoscopy (colonoscopy or flexible sigmoidoscopy):  Excessive amounts of blood in the stool  Significant tenderness or worsening of abdominal pains  Swelling of the abdomen that is new, acute  Fever of 100F or higher    For urgent or emergent issues, a gastroenterologist can be reached at any hour by calling [phone].   DIET:  We do recommend a small meal at first, but then you may proceed to your regular diet.  Drink plenty of fluids but you should avoid alcoholic beverages for 24 hours.  ACTIVITY:  You should plan to take it easy for the rest of today and you should NOT DRIVE or use heavy machinery until tomorrow (because of the sedation medicines used during the test).     FOLLOW UP: Our staff will call the number listed on your records the next business day following your procedure to check on you and address any questions or concerns that you may have regarding the information given to you following your procedure. If we do not reach you, we will leave a message.  However, if you are feeling well and you are not experiencing any problems, there is no need to return our call.  We will assume that you have returned to your regular daily activities without incident.  If any biopsies were taken you will be contacted by phone or by letter within the next 1-3 weeks.  Please call us at [phone] if you have not heard about the biopsies in 3 weeks.    SIGNATURES/CONFIDENTIALITY: You and/or your care partner have signed paperwork which will be entered into your electronic medical record.  These signatures attest to the fact that that the information above on your After Visit Summary has been reviewed and is understood.  Full responsibility of the confidentiality of this discharge information lies with you and/or your care-partner.  Resume Xarelto tomorrow at  Prior dose. No aspirin ,ibuprofen,naproxen,or other non-steroidal anti-inflammatory drugs for 2 weeks, resume remainder of medications. Information given on polyps and hemorrhoids.

## 2017-12-22 NOTE — Op Note (Signed)
[facility] Patient Name: Lucas [name] Procedure Date: 12/22/2017 2:28 PM MRN: [id number] Endoscopist: Ladene Artist , MD Age: 62 Referring MD:  Date of Birth: 01-15-1956 Gender: Male Account #: [id number] Procedure:                Colonoscopy Indications:              Surveillance: Personal history of adenomatous                            polyps on last colonoscopy 5 years ago Medicines:                Monitored Anesthesia Care Procedure:                Pre-Anesthesia Assessment:                           - Prior to the procedure, a History and Physical                            was performed, and patient medications and                            allergies were reviewed. The patient's tolerance of                            previous anesthesia was also reviewed. The risks                            and benefits of the procedure and the sedation                            options and risks were discussed with the patient.                            All questions were answered, and informed consent                            was obtained. Prior Anticoagulants: The patient has                            taken Xarelto (rivaroxaban), last dose was 2 days                            prior to procedure. ASA Grade Assessment: III - A                            patient with severe systemic disease. After                            reviewing the risks and benefits, the patient was                            deemed in satisfactory condition to undergo the  procedure.                           After obtaining informed consent, the colonoscope                            was passed under direct vision. Throughout the                            procedure, the patient's blood pressure, pulse, and                            oxygen saturations were monitored continuously. The                            Colonoscope was introduced through the anus and                 advanced to the the cecum, identified by                            appendiceal orifice and ileocecal valve. The                            ileocecal valve, appendiceal orifice, and rectum                            were photographed. The quality of the bowel                            preparation was good. The colonoscopy was performed                            without difficulty. The patient tolerated the                            procedure well. Scope In: 2:35:51 PM Scope Out: 2:51:33 PM Scope Withdrawal Time: 0 hours 13 minutes 10 seconds  Total Procedure Duration: 0 hours 15 minutes 42 seconds  Findings:                 The perianal and digital rectal examinations were                            normal.                           Three sessile polyps were found in the descending                            colon and hepatic flexure. The polyps were 6 to 8                            mm in size. These polyps were removed with a cold  snare. Resection and retrieval were complete.                           Internal hemorrhoids were found during                            retroflexion. The hemorrhoids were small and Grade                            I (internal hemorrhoids that do not prolapse).                           The exam was otherwise without abnormality on                            direct and retroflexion views. Complications:            No immediate complications. Estimated blood loss:                            None. Estimated Blood Loss:     Estimated blood loss: none. Impression:               - Three 6 to 8 mm polyps in the descending colon                            and at the hepatic flexure, removed with a cold                            snare. Resected and retrieved.                           - Internal hemorrhoids.                           - The examination was otherwise normal on direct                            and  retroflexion views. Recommendation:           - Repeat colonoscopy in 3 - 5 years for                            surveillance pending pathology review.                           - Resume Xarelto (rivaroxaban) tomorrow at prior                            dose. Refer to managing physician for further                            adjustment of therapy.                           - Patient has a contact number available for  emergencies. The signs and symptoms of potential                            delayed complications were discussed with the                            patient. Return to normal activities tomorrow.                            Written discharge instructions were provided to the                            patient.                           - Resume previous diet.                           - Continue present medications.                           - Await pathology results.                           - No aspirin, ibuprofen, naproxen, or other                            non-steroidal anti-inflammatory drugs for 2 weeks                            after polyp removal. Ladene Artist, MD 12/22/2017 2:54:53 PM This report has been signed electronically.

## 2017-12-22 NOTE — Progress Notes (Signed)
Report to PACU, RN, vss, BBS= Clear.  

## 2017-12-23 ENCOUNTER — Telehealth: Payer: Self-pay | Admitting: *Deleted

## 2017-12-23 ENCOUNTER — Telehealth: Payer: Self-pay

## 2017-12-23 NOTE — Telephone Encounter (Signed)
Left message on first follow up call.

## 2017-12-23 NOTE — Telephone Encounter (Signed)
  Follow up Call-  Call back number 12/22/2017  Post procedure Call Back phone  # [phone]  Permission to leave phone message Yes  Some recent data might be hidden     Patient questions:  Do you have a fever, pain , or abdominal swelling? No. Pain Score  0 *  Have you tolerated food without any problems? Yes.    Have you been able to return to your normal activities? Yes.    Do you have any questions about your discharge instructions: Diet   No. Medications  No. Follow up visit  No.  Do you have questions or concerns about your Care? No.  Actions: * If pain score is 4 or above: No action needed, pain <4.

## 2018-01-01 ENCOUNTER — Encounter: Payer: Self-pay | Admitting: Gastroenterology

## 2018-01-18 ENCOUNTER — Other Ambulatory Visit: Payer: Self-pay | Admitting: Cardiology

## 2018-03-16 ENCOUNTER — Telehealth: Payer: Self-pay | Admitting: Cardiology

## 2018-03-16 MED ORDER — NITROGLYCERIN 0.4 MG SL SUBL: 0.4000 mg | SUBLINGUAL_TABLET | SUBLINGUAL | 3 refills | Status: DC | PRN

## 2018-03-16 NOTE — Telephone Encounter (Signed)
Pt called stating he had chest pains a few nights ago, he took a nitro and it went away, said he had some chest pain again last night but it went away.  He's scheduled to see Dr. Harl Bowie next week

## 2018-03-16 NOTE — Telephone Encounter (Signed)
Patient has no pain now, I did refill his NTG as they just expired.He will note if he has any further pain and has apt with Dr Harl Bowie next week

## 2018-03-25 ENCOUNTER — Ambulatory Visit: Payer: 59 | Admitting: Cardiology

## 2018-03-25 ENCOUNTER — Other Ambulatory Visit (HOSPITAL_COMMUNITY)
Admission: RE | Admit: 2018-03-25 | Discharge: 2018-03-25 | Disposition: A | Discharge status: Home / Self Care | Payer: 59 | Source: Ambulatory Visit | Attending: Cardiology | Admitting: Cardiology

## 2018-03-25 ENCOUNTER — Encounter: Payer: Self-pay | Admitting: Cardiology

## 2018-03-25 VITALS — BP 132/86 | HR 68 | Ht 70.0 in | Wt 280.0 lb

## 2018-03-25 DIAGNOSIS — I25118 Atherosclerotic heart disease of native coronary artery with other forms of angina pectoris: Secondary | ICD-10-CM | POA: Diagnosis not present

## 2018-03-25 DIAGNOSIS — Z01818 Encounter for other preprocedural examination: Secondary | ICD-10-CM

## 2018-03-25 LAB — CBC WITH DIFFERENTIAL/PLATELET
Basophils Absolute: 0 10*3/uL (ref 0.0–0.1)
Basophils Relative: 0 %
Eosinophils Absolute: 0.1 10*3/uL (ref 0.0–0.7)
Eosinophils Relative: 1 %
HCT: 44.3 % (ref 39.0–52.0)
Hemoglobin: 15 g/dL (ref 13.0–17.0)
Lymphocytes Relative: 38 %
Lymphs Abs: 4 10*3/uL (ref 0.7–4.0)
MCH: 31.9 pg (ref 26.0–34.0)
MCHC: 33.9 g/dL (ref 30.0–36.0)
MCV: 94.3 fL (ref 78.0–100.0)
Monocytes Absolute: 0.8 10*3/uL (ref 0.1–1.0)
Monocytes Relative: 8 %
Neutro Abs: 5.6 10*3/uL (ref 1.7–7.7)
Neutrophils Relative %: 53 %
Platelets: 168 10*3/uL (ref 150–400)
RBC: 4.7 MIL/uL (ref 4.22–5.81)
RDW: 14 % (ref 11.5–15.5)
WBC: 10.6 10*3/uL — ABNORMAL HIGH (ref 4.0–10.5)

## 2018-03-25 LAB — BASIC METABOLIC PANEL
Anion gap: 9 (ref 5–15)
BUN: 19 mg/dL (ref 6–20)
CO2: 23 mmol/L (ref 22–32)
Calcium: 9.1 mg/dL (ref 8.9–10.3)
Chloride: 107 mmol/L (ref 101–111)
Creatinine, Ser: 0.89 mg/dL (ref 0.61–1.24)
GFR calc Af Amer: >60 mL/min (ref >60)
GFR calc non Af Amer: >60 mL/min (ref >60)
Glucose, Bld: 167 mg/dL — ABNORMAL HIGH (ref 65–99)
Potassium: 4.3 mmol/L (ref 3.5–5.1)
Sodium: 139 mmol/L (ref 135–145)

## 2018-03-25 MED ORDER — ISOSORBIDE MONONITRATE ER 60 MG PO TB24: 90.0000 mg | ORAL_TABLET | Freq: Every day | ORAL | 3 refills | Status: DC

## 2018-03-25 NOTE — Progress Notes (Addendum)
Clinical Summary [name] is a 62 y.o.male [name] is a 62 y.o.male seen today for follow up of the following medical problems. This is a focused visit on history of CAD and recent chest pain.    1. CAD - previous chest pain episode occurred in the setting of afib with RVR - cath 01/29/16 with apical LAD disease, treated medically. Moderate RCA and D2 disease.   - reports recent episodes of chest pain.  - episode about 3 weeks ago while at work. Occurred while at work walking. Pressure midchest, 3/10 in severity. No other associated symptoms. Not positional. Better with NG x 1. Total episode about 30 minutes. 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 Stopped and rest, resolved. Happened a second time while walking from his job to his car. Notes some increased DOE with his regular activities which is new.        Past Medical History:  Diagnosis Date  . Arthritis   . CAD (coronary artery disease)    a. LHC on 01/29/16 which revealed significant apical LAD stenosis, best treated medically. There was moderate disease in the D2 and mid nondominant RCA.[name] No PCI performed   . Cataract   . Diabetes mellitus    type 2  . GERD (gastroesophageal reflux disease)   . Hyperlipidemia   . Hypertension   . Obesity   . PAF (paroxysmal atrial fibrillation) ([place]) 01/2016  . Sleep apnea    has CPAP machine but not wear  . Tubular adenoma of colon 08/2012     Allergies  Allergen Reactions  . Bee Venom Swelling  . Codeine Nausea Only     Current Outpatient Medications  Medication Sig Dispense Refill  . acetaminophen (TYLENOL) 325 MG tablet Take 650 mg by mouth as needed.    [name] aspirin 81 MG tablet Take 81 mg by mouth daily.    [name] atorvastatin (LIPITOR) 80 MG tablet TAKE 1 TABLET (80 MG TOTAL) BY MOUTH DAILY AT 6 PM. 90 tablet 3  . benazepril (LOTENSIN) 20 MG tablet Take 20 mg by mouth daily.    . Canagliflozin-Metformin HCl (INVOKAMET) [phone] MG  TABS Take 1 tablet by mouth 2 (two) times daily.    [name] diltiazem (CARDIZEM) 30 MG tablet Take 1 tab 2 times daily as needed for palpitations 180 tablet 3  . furosemide (LASIX) 20 MG tablet Take 1 tablet (20 mg total) by mouth daily as needed. 90 tablet 3  . glimepiride (AMARYL) 4 MG tablet Take 4 mg by mouth 2 (two) times daily.     . isosorbide mononitrate (IMDUR) 60 MG 24 hr tablet TAKE 1 TABLET (60 MG TOTAL) BY MOUTH DAILY. 90 tablet 3  . metoprolol succinate (TOPROL-XL) 100 MG 24 hr tablet TAKE 1 TABLET BY MOUTH DAILY TAKE WITH OR IMMEDIATELY FOLLOWING A MEAL (Patient taking differently: TAKE 100 mg  TABLET BY MOUTH DAILY TAKE WITH OR IMMEDIATELY FOLLOWING A MEAL) 90 tablet 3  . nitroGLYCERIN (NITROSTAT) 0.4 MG SL tablet Place 1 tablet (0.4 mg total) under the tongue every 5 (five) minutes as needed for chest pain. 25 tablet 3  . oxyCODONE-acetaminophen (ROXICET) 5-325 MG tablet Take 1-2 tablets by mouth every 4 (four) hours as needed. 60 tablet 0  . pantoprazole (PROTONIX) 40 MG tablet Take 40 mg by mouth daily.    [name] 20 MG TABS tablet TAKE 1 TABLET (20 MG TOTAL) BY MOUTH DAILY WITH SUPPER. 90 tablet 3  No current facility-administered medications for this visit.      Past Surgical History:  Procedure Laterality Date  . ANKLE SURGERY Left 2012   tendon repair  . CARDIAC CATHETERIZATION [name] 01/29/2016   Procedure: Left Heart Cath and Coronary Angiography;  Surgeon: [name], MD;  Location: [facility];  Service: Cardiovascular;  Laterality: [name];  . CATARACT EXTRACTION W/PHACO  11/11/2012   Procedure: CATARACT EXTRACTION PHACO AND INTRAOCULAR LENS PLACEMENT (IOC);  Surgeon: [name], MD;  Location: [facility];  Service: Ophthalmology;  Laterality: Right;  CDE:  5.56  . CATARACT EXTRACTION W/PHACO Left 12/15/2013   Procedure: CATARACT EXTRACTION PHACO AND INTRAOCULAR LENS PLACEMENT (IOC);  Surgeon: [name], MD;  Location: [facility];  Service: Ophthalmology;  Laterality: Left;   CDE:13.36  . CHOLECYSTECTOMY    . CHONDROPLASTY Right 05/28/2017   Procedure: RIGHT KNEE CHONDROPLASTY;  Surgeon: [name], MD;  Location: [facility];  Service: Orthopedics;  Laterality: Right;  . COLONOSCOPY    . CYSTOSCOPY W/ URETERAL STENT PLACEMENT     right  . ELECTROPHYSIOLOGIC STUDY [name] 06/02/2016   Procedure: A-Flutter Ablation;  Surgeon: [name], MD;  Location: [facility];  Service: Cardiovascular;  Laterality: [name];  . KNEE ARTHROSCOPY WITH MEDIAL MENISECTOMY Right 05/28/2017   Procedure: KNEE ARTHROSCOPY WITH MEDIAL MENISECTOMY WITH EXTENSIVE SYNOVECTOMY;  Surgeon: [name], MD;  Location: [facility];  Service: Orthopedics;  Laterality: Right;  . left knee sugery Left    Arthroscopy  . NASAL SINUS SURGERY    . POLYPECTOMY    . TONSILLECTOMY       Allergies  Allergen Reactions  . Bee Venom Swelling  . Codeine Nausea Only      Family History  Problem Relation Age of Onset  . Heart murmur Mother   . Aneurysm Mother   . Hypertension Mother   . Cirrhosis Father   . Heart attack Sister   . Diabetes Sister   . Aneurysm Maternal Grandmother   . Stroke Maternal Grandmother   . Heart attack Paternal Grandmother   . Stroke Sister   . Colon cancer Neg Hx   . Rectal cancer Neg Hx   . Stomach cancer Neg Hx      Social History [name] reports that he has never smoked. He has never used smokeless tobacco. [name] reports that he drinks alcohol.   Review of Systems CONSTITUTIONAL: No weight loss, fever, chills, weakness or fatigue.  HEENT: Eyes: No visual loss, blurred vision, double vision or yellow sclerae.No hearing loss, sneezing, congestion, runny nose or sore throat.  SKIN: No rash or itching.  CARDIOVASCULAR: per hpi RESPIRATORY: per hpi GASTROINTESTINAL: No anorexia, nausea, vomiting or diarrhea. No abdominal pain or blood.  GENITOURINARY: No burning on urination, no polyuria NEUROLOGICAL: No  headache, dizziness, syncope, paralysis, ataxia, numbness or tingling in the extremities. No change in bowel or bladder control.  MUSCULOSKELETAL: No muscle, back pain, joint pain or stiffness.  LYMPHATICS: No enlarged nodes. No history of splenectomy.  PSYCHIATRIC: No history of depression or anxiety.  ENDOCRINOLOGIC: No reports of sweating, cold or heat intolerance. No polyuria or polydipsia.  [name]   Physical Examination Vitals:   03/25/18 1345  BP: 132/86  Pulse: 68  SpO2: 97%   Vitals:   03/25/18 1345  Weight: 280 lb (127 kg)  Height: 5\' 10"  (1.778 m)    Gen: resting comfortably, no acute distress HEENT: no scleral icterus, pupils equal  round and reactive, no palptable cervical adenopathy,  CV: RRR, no m/r/g, no jvd Resp: Clear to auscultation bilaterally GI: abdomen is soft, non-tender, non-distended, normal bowel sounds, no hepatosplenomegaly MSK: extremities are warm, no edema.  Skin: warm, no rash Neuro:  no focal deficits Psych: appropriate affect   Diagnostic Studies  01/2016 echo Study Conclusions  - Left ventricle: The cavity size was mildly dilated. Wall  thickness was increased in a pattern of mild LVH. Systolic  function was normal. The estimated ejection fraction was in the  range of 55% to 60%.  01/2016 cath 1. Dist LAD lesion, 80% stenosed. 2. Mid RCA lesion, 60% stenosed. 3. Ost 2nd Diag to 2nd Diag lesion, 60% stenosed. 4. 2nd Mrg lesion, 30% stenosed. 5. Mid LAD lesion, 25% stenosed. 6. LPDA lesion, 45% stenosed.   Significant apical LAD stenosis, best treated with medication. Moderate disease in the second diagonal and mid nondominant RCA.  Normal left ventricular systolic function. Normal left ventricular filling pressures.  Post Cath Recommendations:   Medical therapy of both atrial fibrillation and coronary artery disease. Percutaneous intervention is not currently indicated for coronary disease.    Assessment and Plan   1.  CAD - new onset exertional chest pain symptoms and DOE, concerning for angina. Patient with known distal LAD, borderline RCA and D2 disease from 2017 cath - ekg today SR, no acuete ischemic changes - based on symptoms will plan for cath for further evaluation. Hold xarelto 48 hrs prior to cath.  - work excuse given until after cath.    I have reviewed the risks, indications, and alternatives to cardiac catheterization, possible angioplasty, and stenting with the patient  today. Risks include but are not limited to bleeding, infection, vascular injury, stroke, myocardial infection, arrhythmia, kidney injury, radiation-related injury in the case of prolonged fluoroscopy use, emergency cardiac surgery, and death. The patient understands the risks of serious complication is 1-2 in 6283 with diagnostic cardiac cath and 1-2% or less with angioplasty/stenting.       Arnoldo Lenis, M.D., F.A.C.C.

## 2018-03-25 NOTE — Patient Instructions (Addendum)
Medication Instructions:  Increase Imdur to 90 mg daily (1 1/2 tablets)   Labwork: Today  bmet Cbc   Testing/Procedures: Your physician has requested that you have a cardiac catheterization. Cardiac catheterization is used to diagnose and/or treat various heart conditions. Doctors may recommend this procedure for a number of different reasons. The most common reason is to evaluate chest pain. Chest pain can be a symptom of coronary artery disease (CAD), and cardiac catheterization can show whether plaque is narrowing or blocking your heart's arteries. This procedure is also used to evaluate the valves, as well as measure the blood flow and oxygen levels in different parts of your heart. For further information please visit [email or web address]. Please follow instruction sheet, as given.    Follow-Up: Your physician recommends that you schedule a follow-up appointment in: 3 weeks    Any Other Special Instructions Will Be Listed Below (If Applicable).     If you need a refill on your cardiac medications before your next appointment, please call your pharmacy.    [place] [id number] Dept: [phone] Loc: [facility]  03/25/2018  You are scheduled for a Cardiac Catheterization on Tuesday, May 21 with Dr. Glenetta Hew.  1. Please arrive at the [facility] (Main Entrance A) at [facility]: [address] [place], [place] [address] at 5:30 AM (two hours before your procedure to ensure your preparation). Free valet parking service is available.   Special note: Every effort is made to have your procedure done on time. Please understand that emergencies sometimes delay scheduled procedures.  2. Diet: Do not eat or drink anything after midnight prior to your procedure except sips of water to take medications.  3. Labs:TODAY   4. Medication instructions in  preparation for your procedure:  Stop taking Xarelto (Rivaroxaban) on Saturday, May 18.  DO NOT TAKE LASIX MORNING OF PROCEDURE  HOLD GLIMEPRIDE THE MORNING OF PROCEDURE  TAKE ONLY 1/2 INSULIN DOSE THE NIGHT BEFORE PROCEDURE & NONE THE MORNING OF PROCEDURE   5. Plan for one night stay--bring personal belongings. 6. Bring a current list of your medications and current insurance cards. 7. You MUST have a responsible person to drive you home. 8. Someone MUST be with you the first 24 hours after you arrive home or your discharge will be delayed. 9. Please wear clothes that are easy to get on and off and wear slip-on shoes.  Thank you for allowing [place] to care for you!   -- [facility]

## 2018-03-25 NOTE — H&P (View-Only) (Signed)
Clinical Summary Lucas [name] is a 61 y.o.male Lucas [name] is a 62 y.o.male seen today for follow up of the following medical problems. This is a focused visit on history of CAD and recent chest pain.    1. CAD - previous chest pain episode occurred in the setting of afib with RVR - cath 01/29/16 with apical LAD disease, treated medically. Moderate RCA and D2 disease.   - reports recent episodes of chest pain.  - episode about 3 weeks ago while at work. Occurred while at work walking. Pressure midchest, 3/10 in severity. No other associated symptoms. Not positional. Better with NG x 1. Total episode about 30 minutes. 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 Stopped and rest, resolved. Happened a second time while walking from his job to his car. Notes some increased DOE with his regular activities which is new.        Past Medical History:  Diagnosis Date  . Arthritis   . CAD (coronary artery disease)    a. LHC on 01/29/16 which revealed significant apical LAD stenosis, best treated medically. There was moderate disease in the D2 and mid nondominant RCA.[name] No PCI performed   . Cataract   . Diabetes mellitus    type 2  . GERD (gastroesophageal reflux disease)   . Hyperlipidemia   . Hypertension   . Obesity   . PAF (paroxysmal atrial fibrillation) ([place]) 01/2016  . Sleep apnea    has CPAP machine but not wear  . Tubular adenoma of colon 08/2012     Allergies  Allergen Reactions  . Bee Venom Swelling  . Codeine Nausea Only     Current Outpatient Medications  Medication Sig Dispense Refill  . acetaminophen (TYLENOL) 325 MG tablet Take 650 mg by mouth as needed.    [name] aspirin 81 MG tablet Take 81 mg by mouth daily.    [name] atorvastatin (LIPITOR) 80 MG tablet TAKE 1 TABLET (80 MG TOTAL) BY MOUTH DAILY AT 6 PM. 90 tablet 3  . benazepril (LOTENSIN) 20 MG tablet Take 20 mg by mouth daily.    . Canagliflozin-Metformin HCl (INVOKAMET) [phone] MG  TABS Take 1 tablet by mouth 2 (two) times daily.    [name] diltiazem (CARDIZEM) 30 MG tablet Take 1 tab 2 times daily as needed for palpitations 180 tablet 3  . furosemide (LASIX) 20 MG tablet Take 1 tablet (20 mg total) by mouth daily as needed. 90 tablet 3  . glimepiride (AMARYL) 4 MG tablet Take 4 mg by mouth 2 (two) times daily.     . isosorbide mononitrate (IMDUR) 60 MG 24 hr tablet TAKE 1 TABLET (60 MG TOTAL) BY MOUTH DAILY. 90 tablet 3  . metoprolol succinate (TOPROL-XL) 100 MG 24 hr tablet TAKE 1 TABLET BY MOUTH DAILY TAKE WITH OR IMMEDIATELY FOLLOWING A MEAL (Patient taking differently: TAKE 100 mg  TABLET BY MOUTH DAILY TAKE WITH OR IMMEDIATELY FOLLOWING A MEAL) 90 tablet 3  . nitroGLYCERIN (NITROSTAT) 0.4 MG SL tablet Place 1 tablet (0.4 mg total) under the tongue every 5 (five) minutes as needed for chest pain. 25 tablet 3  . oxyCODONE-acetaminophen (ROXICET) 5-325 MG tablet Take 1-2 tablets by mouth every 4 (four) hours as needed. 60 tablet 0  . pantoprazole (PROTONIX) 40 MG tablet Take 40 mg by mouth daily.    [name] 20 MG TABS tablet TAKE 1 TABLET (20 MG TOTAL) BY MOUTH DAILY WITH SUPPER. 90 tablet 3  No current facility-administered medications for this visit.      Past Surgical History:  Procedure Laterality Date  . ANKLE SURGERY Left 2012   tendon repair  . CARDIAC CATHETERIZATION [name] 01/29/2016   Procedure: Left Heart Cath and Coronary Angiography;  Surgeon: [name], MD;  Location: [facility];  Service: Cardiovascular;  Laterality: [name];  . CATARACT EXTRACTION W/PHACO  11/11/2012   Procedure: CATARACT EXTRACTION PHACO AND INTRAOCULAR LENS PLACEMENT (IOC);  Surgeon: Tonny Kohana Amble, MD;  Location: [facility];  Service: Ophthalmology;  Laterality: Right;  CDE:  5.56  . CATARACT EXTRACTION W/PHACO Left 12/15/2013   Procedure: CATARACT EXTRACTION PHACO AND INTRAOCULAR LENS PLACEMENT (IOC);  Surgeon: Tonny Sammantha Mehlhaff, MD;  Location: [facility];  Service: Ophthalmology;  Laterality: Left;   CDE:13.36  . CHOLECYSTECTOMY    . CHONDROPLASTY Right 05/28/2017   Procedure: RIGHT KNEE CHONDROPLASTY;  Surgeon: [name], MD;  Location: [facility];  Service: Orthopedics;  Laterality: Right;  . COLONOSCOPY    . CYSTOSCOPY W/ URETERAL STENT PLACEMENT     right  . ELECTROPHYSIOLOGIC STUDY [name] 06/02/2016   Procedure: A-Flutter Ablation;  Surgeon: [name], MD;  Location: [facility];  Service: Cardiovascular;  Laterality: [name];  . KNEE ARTHROSCOPY WITH MEDIAL MENISECTOMY Right 05/28/2017   Procedure: KNEE ARTHROSCOPY WITH MEDIAL MENISECTOMY WITH EXTENSIVE SYNOVECTOMY;  Surgeon: [name], MD;  Location: [facility];  Service: Orthopedics;  Laterality: Right;  . left knee sugery Left    Arthroscopy  . NASAL SINUS SURGERY    . POLYPECTOMY    . TONSILLECTOMY       Allergies  Allergen Reactions  . Bee Venom Swelling  . Codeine Nausea Only      Family History  Problem Relation Age of Onset  . Heart murmur Mother   . Aneurysm Mother   . Hypertension Mother   . Cirrhosis Father   . Heart attack Sister   . Diabetes Sister   . Aneurysm Maternal Grandmother   . Stroke Maternal Grandmother   . Heart attack Paternal Grandmother   . Stroke Sister   . Colon cancer Neg Hx   . Rectal cancer Neg Hx   . Stomach cancer Neg Hx      Social History Lucas [name] reports that he has never smoked. He has never used smokeless tobacco. Lucas [name] reports that he drinks alcohol.   Review of Systems CONSTITUTIONAL: No weight loss, fever, chills, weakness or fatigue.  HEENT: Eyes: No visual loss, blurred vision, double vision or yellow sclerae.No hearing loss, sneezing, congestion, runny nose or sore throat.  SKIN: No rash or itching.  CARDIOVASCULAR: per hpi RESPIRATORY: per hpi GASTROINTESTINAL: No anorexia, nausea, vomiting or diarrhea. No abdominal pain or blood.  GENITOURINARY: No burning on urination, no polyuria NEUROLOGICAL: No  headache, dizziness, syncope, paralysis, ataxia, numbness or tingling in the extremities. No change in bowel or bladder control.  MUSCULOSKELETAL: No muscle, back pain, joint pain or stiffness.  LYMPHATICS: No enlarged nodes. No history of splenectomy.  PSYCHIATRIC: No history of depression or anxiety.  ENDOCRINOLOGIC: No reports of sweating, cold or heat intolerance. No polyuria or polydipsia.  [name]   Physical Examination Vitals:   03/25/18 1345  BP: 132/86  Pulse: 68  SpO2: 97%   Vitals:   03/25/18 1345  Weight: 280 lb (127 kg)  Height: 5\' 10"  (1.778 m)    Gen: resting comfortably, no acute distress HEENT: no scleral icterus, pupils equal  round and reactive, no palptable cervical adenopathy,  CV: RRR, no m/r/g, no jvd Resp: Clear to auscultation bilaterally GI: abdomen is soft, non-tender, non-distended, normal bowel sounds, no hepatosplenomegaly MSK: extremities are warm, no edema.  Skin: warm, no rash Neuro:  no focal deficits Psych: appropriate affect   Diagnostic Studies  01/2016 echo Study Conclusions  - Left ventricle: The cavity size was mildly dilated. Wall  thickness was increased in a pattern of mild LVH. Systolic  function was normal. The estimated ejection fraction was in the  range of 55% to 60%.  01/2016 cath 1. Dist LAD lesion, 80% stenosed. 2. Mid RCA lesion, 60% stenosed. 3. Ost 2nd Diag to 2nd Diag lesion, 60% stenosed. 4. 2nd Mrg lesion, 30% stenosed. 5. Mid LAD lesion, 25% stenosed. 6. LPDA lesion, 45% stenosed.   Significant apical LAD stenosis, best treated with medication. Moderate disease in the second diagonal and mid nondominant RCA.  Normal left ventricular systolic function. Normal left ventricular filling pressures.  Post Cath Recommendations:   Medical therapy of both atrial fibrillation and coronary artery disease. Percutaneous intervention is not currently indicated for coronary disease.    Assessment and Plan   1.  CAD - new onset exertional chest pain symptoms and DOE, concerning for angina. Patient with known distal LAD, borderline RCA and D2 disease from 2017 cath - ekg today SR, no acuete ischemic changes - based on symptoms will plan for cath for further evaluation. Hold xarelto 48 hrs prior to cath.  - work excuse given until after cath.    I have reviewed the risks, indications, and alternatives to cardiac catheterization, possible angioplasty, and stenting with the patient  today. Risks include but are not limited to bleeding, infection, vascular injury, stroke, myocardial infection, arrhythmia, kidney injury, radiation-related injury in the case of prolonged fluoroscopy use, emergency cardiac surgery, and death. The patient understands the risks of serious complication is 1-2 in 9211 with diagnostic cardiac cath and 1-2% or less with angioplasty/stenting.       Lucas [name], M.D., F.A.C.C.

## 2018-03-28 ENCOUNTER — Other Ambulatory Visit: Payer: Self-pay | Admitting: Cardiology

## 2018-03-28 ENCOUNTER — Encounter: Payer: Self-pay | Admitting: Cardiology

## 2018-03-28 DIAGNOSIS — R0789 Other chest pain: Secondary | ICD-10-CM

## 2018-03-29 ENCOUNTER — Telehealth: Payer: Self-pay | Admitting: *Deleted

## 2018-03-29 NOTE — Telephone Encounter (Signed)
Pt contacted pre-catheterization scheduled at [facility] for: Tuesday May 21,2019 7:30 AM Verified arrival time and place: [facility] Entrance A at: 5:30 AM  No solid food after midnight prior to cath, clear liquids until 5 AM day of procedure.  Hold: Xarelto-last dose afternoon 03/27/18 Glimepiride AM of procedure. Invokamet AM of procedure and 48 hours post procedure. Furosemide AM of procedure.  AM meds can be  taken pre-cath with sip of water including: ASA 81 mg  Confirmed patient has responsible person to drive home post procedure and observe patient for 24 hours: yes

## 2018-03-30 ENCOUNTER — Ambulatory Visit (HOSPITAL_COMMUNITY)
Admission: RE | Admit: 2018-03-30 | Discharge: 2018-03-30 | Disposition: A | Discharge status: Home / Self Care | Payer: 59 | Source: Ambulatory Visit | Attending: Cardiology | Admitting: Cardiology

## 2018-03-30 ENCOUNTER — Ambulatory Visit (HOSPITAL_COMMUNITY)
Admission: RE | Disposition: A | Discharge status: Home / Self Care | Payer: Self-pay | Source: Ambulatory Visit | Attending: Cardiology

## 2018-03-30 ENCOUNTER — Encounter (HOSPITAL_COMMUNITY): Payer: Self-pay | Admitting: Cardiology

## 2018-03-30 DIAGNOSIS — Z6838 Body mass index (BMI) 38.0-38.9, adult: Secondary | ICD-10-CM | POA: Insufficient documentation

## 2018-03-30 DIAGNOSIS — E785 Hyperlipidemia, unspecified: Secondary | ICD-10-CM | POA: Insufficient documentation

## 2018-03-30 DIAGNOSIS — Z9841 Cataract extraction status, right eye: Secondary | ICD-10-CM | POA: Insufficient documentation

## 2018-03-30 DIAGNOSIS — K219 Gastro-esophageal reflux disease without esophagitis: Secondary | ICD-10-CM | POA: Diagnosis not present

## 2018-03-30 DIAGNOSIS — I10 Essential (primary) hypertension: Secondary | ICD-10-CM | POA: Diagnosis not present

## 2018-03-30 DIAGNOSIS — Z8249 Family history of ischemic heart disease and other diseases of the circulatory system: Secondary | ICD-10-CM | POA: Insufficient documentation

## 2018-03-30 DIAGNOSIS — Z7982 Long term (current) use of aspirin: Secondary | ICD-10-CM | POA: Diagnosis not present

## 2018-03-30 DIAGNOSIS — Z91030 Bee allergy status: Secondary | ICD-10-CM | POA: Insufficient documentation

## 2018-03-30 DIAGNOSIS — Z7901 Long term (current) use of anticoagulants: Secondary | ICD-10-CM | POA: Diagnosis not present

## 2018-03-30 DIAGNOSIS — M1990 Unspecified osteoarthritis, unspecified site: Secondary | ICD-10-CM | POA: Diagnosis not present

## 2018-03-30 DIAGNOSIS — E119 Type 2 diabetes mellitus without complications: Secondary | ICD-10-CM | POA: Insufficient documentation

## 2018-03-30 DIAGNOSIS — Z9049 Acquired absence of other specified parts of digestive tract: Secondary | ICD-10-CM | POA: Insufficient documentation

## 2018-03-30 DIAGNOSIS — Z9842 Cataract extraction status, left eye: Secondary | ICD-10-CM | POA: Diagnosis not present

## 2018-03-30 DIAGNOSIS — I480 Paroxysmal atrial fibrillation: Secondary | ICD-10-CM | POA: Insufficient documentation

## 2018-03-30 DIAGNOSIS — H269 Unspecified cataract: Secondary | ICD-10-CM | POA: Insufficient documentation

## 2018-03-30 DIAGNOSIS — Z833 Family history of diabetes mellitus: Secondary | ICD-10-CM | POA: Diagnosis not present

## 2018-03-30 DIAGNOSIS — R0789 Other chest pain: Secondary | ICD-10-CM

## 2018-03-30 DIAGNOSIS — Z7984 Long term (current) use of oral hypoglycemic drugs: Secondary | ICD-10-CM | POA: Insufficient documentation

## 2018-03-30 DIAGNOSIS — G4730 Sleep apnea, unspecified: Secondary | ICD-10-CM | POA: Insufficient documentation

## 2018-03-30 DIAGNOSIS — E669 Obesity, unspecified: Secondary | ICD-10-CM | POA: Insufficient documentation

## 2018-03-30 DIAGNOSIS — I25119 Atherosclerotic heart disease of native coronary artery with unspecified angina pectoris: Secondary | ICD-10-CM | POA: Diagnosis present

## 2018-03-30 DIAGNOSIS — Z885 Allergy status to narcotic agent status: Secondary | ICD-10-CM | POA: Insufficient documentation

## 2018-03-30 DIAGNOSIS — Z960 Presence of urogenital implants: Secondary | ICD-10-CM | POA: Diagnosis not present

## 2018-03-30 DIAGNOSIS — Z9889 Other specified postprocedural states: Secondary | ICD-10-CM | POA: Insufficient documentation

## 2018-03-30 DIAGNOSIS — Z955 Presence of coronary angioplasty implant and graft: Secondary | ICD-10-CM | POA: Insufficient documentation

## 2018-03-30 DIAGNOSIS — Z823 Family history of stroke: Secondary | ICD-10-CM | POA: Insufficient documentation

## 2018-03-30 DIAGNOSIS — Z79899 Other long term (current) drug therapy: Secondary | ICD-10-CM | POA: Insufficient documentation

## 2018-03-30 HISTORY — PX: LEFT HEART CATH AND CORONARY ANGIOGRAPHY: CATH118249

## 2018-03-30 LAB — GLUCOSE, CAPILLARY
Glucose-Capillary: 178 mg/dL — ABNORMAL HIGH (ref 65–99)
Glucose-Capillary: 142 mg/dL — ABNORMAL HIGH (ref 65–99)

## 2018-03-30 SURGERY — LEFT HEART CATH AND CORONARY ANGIOGRAPHY
Anesthesia: LOCAL

## 2018-03-30 MED ORDER — SODIUM CHLORIDE 0.9% FLUSH: 3.0000 mL | INTRAVENOUS | Status: DC | PRN

## 2018-03-30 MED ORDER — SODIUM CHLORIDE 0.9 % IV SOLN: 250.0000 mL | INTRAVENOUS | Status: DC | PRN

## 2018-03-30 MED ORDER — SODIUM CHLORIDE 0.9 % IV SOLN: INTRAVENOUS | Status: AC

## 2018-03-30 MED ORDER — LIDOCAINE HCL (PF) 1 % IJ SOLN
INTRAMUSCULAR | Status: DC | PRN
  Administered 2018-03-30: 2 mL

## 2018-03-30 MED ORDER — ASPIRIN 81 MG PO CHEW: 81.0000 mg | CHEWABLE_TABLET | ORAL | Status: DC

## 2018-03-30 MED ORDER — SODIUM CHLORIDE 0.9 % WEIGHT BASED INFUSION: 1.0000 mL/kg/h | INTRAVENOUS | Status: DC

## 2018-03-30 MED ORDER — SODIUM CHLORIDE 0.9% FLUSH: 3.0000 mL | Freq: Two times a day (BID) | INTRAVENOUS | Status: DC

## 2018-03-30 MED ORDER — MIDAZOLAM HCL 2 MG/2ML IJ SOLN
INTRAMUSCULAR | Status: AC
  Filled 2018-03-30: qty 2

## 2018-03-30 MED ORDER — LIDOCAINE HCL (PF) 1 % IJ SOLN
INTRAMUSCULAR | Status: AC
  Filled 2018-03-30: qty 30

## 2018-03-30 MED ORDER — ONDANSETRON HCL 4 MG/2ML IJ SOLN: 4.0000 mg | Freq: Four times a day (QID) | INTRAMUSCULAR | Status: DC | PRN

## 2018-03-30 MED ORDER — FENTANYL CITRATE (PF) 100 MCG/2ML IJ SOLN
INTRAMUSCULAR | Status: AC
  Filled 2018-03-30: qty 2

## 2018-03-30 MED ORDER — HEPARIN (PORCINE) IN NACL 1000-0.9 UT/500ML-% IV SOLN
INTRAVENOUS | Status: AC
  Filled 2018-03-30: qty 1000

## 2018-03-30 MED ORDER — IOPAMIDOL (ISOVUE-370) INJECTION 76%
INTRAVENOUS | Status: DC | PRN
  Administered 2018-03-30: 90 mL

## 2018-03-30 MED ORDER — IOPAMIDOL (ISOVUE-370) INJECTION 76%
INTRAVENOUS | Status: AC
  Filled 2018-03-30: qty 100

## 2018-03-30 MED ORDER — VERAPAMIL HCL 2.5 MG/ML IV SOLN
INTRAVENOUS | Status: AC
  Filled 2018-03-30: qty 2

## 2018-03-30 MED ORDER — VERAPAMIL HCL 2.5 MG/ML IV SOLN
INTRAVENOUS | Status: DC | PRN
  Administered 2018-03-30: 10 mL via INTRA_ARTERIAL

## 2018-03-30 MED ORDER — HEPARIN SODIUM (PORCINE) 1000 UNIT/ML IJ SOLN
INTRAMUSCULAR | Status: AC
  Filled 2018-03-30: qty 1

## 2018-03-30 MED ORDER — SODIUM CHLORIDE 0.9 % WEIGHT BASED INFUSION
3.0000 mL/kg/h | INTRAVENOUS | Status: AC
  Administered 2018-03-30: 3 mL/kg/h via INTRAVENOUS

## 2018-03-30 MED ORDER — ACETAMINOPHEN 325 MG PO TABS: 650.0000 mg | ORAL_TABLET | ORAL | Status: DC | PRN

## 2018-03-30 MED ORDER — HEPARIN (PORCINE) IN NACL 2-0.9 UNITS/ML
INTRAMUSCULAR | Status: AC | PRN
  Administered 2018-03-30 (×2): 500 mL

## 2018-03-30 MED ORDER — FENTANYL CITRATE (PF) 100 MCG/2ML IJ SOLN
INTRAMUSCULAR | Status: DC | PRN
  Administered 2018-03-30: 25 ug via INTRAVENOUS

## 2018-03-30 MED ORDER — HEPARIN SODIUM (PORCINE) 1000 UNIT/ML IJ SOLN
INTRAMUSCULAR | Status: DC | PRN
  Administered 2018-03-30: 5000 [IU] via INTRAVENOUS

## 2018-03-30 MED ORDER — MIDAZOLAM HCL 2 MG/2ML IJ SOLN
INTRAMUSCULAR | Status: DC | PRN
  Administered 2018-03-30: 1 mg via INTRAVENOUS

## 2018-03-30 SURGICAL SUPPLY — 13 items
CATH INFINITI 5 FR JL3.5 (CATHETERS) ×1 IMPLANT
CATH INFINITI JR4 5F (CATHETERS) ×1 IMPLANT
CATH OPTITORQUE TIG 4.0 5F (CATHETERS) ×1 IMPLANT
DEVICE RAD COMP TR BAND LRG (VASCULAR PRODUCTS) ×1 IMPLANT
GUIDEWIRE INQWIRE 1.5J.035X260 (WIRE) IMPLANT
INQWIRE 1.5J .035X260CM (WIRE) ×2
KIT HEART LEFT (KITS) ×2 IMPLANT
NDL PERC 21GX4CM (NEEDLE) IMPLANT
NEEDLE PERC 21GX4CM (NEEDLE) ×2 IMPLANT
PACK CARDIAC CATHETERIZATION (CUSTOM PROCEDURE TRAY) ×2 IMPLANT
SHEATH RAIN RADIAL 21G 6FR (SHEATH) ×1 IMPLANT
TRANSDUCER W/STOPCOCK (MISCELLANEOUS) ×2 IMPLANT
TUBING CIL FLEX 10 FLL-RA (TUBING) ×2 IMPLANT

## 2018-03-30 NOTE — Discharge Instructions (Signed)

## 2018-03-30 NOTE — Interval H&P Note (Signed)
History and Physical Interval Note:  03/30/2018 7:27 AM  Lucas [name]  has presented today for surgery, with the diagnosis of cp-? Exertional Angina (Class II-III)  The various methods of treatment have been discussed with the patient and family. After consideration of risks, benefits and other options for treatment, the patient has consented to  Procedure(s): LEFT HEART CATH AND CORONARY ANGIOGRAPHY ([name]) with possible PERCUTANEOUS CORONARY INTERVENTION as a surgical intervention .  The patient's history has been reviewed, patient examined, no change in status, stable for surgery.  I have reviewed the patient's chart and labs.  Questions were answered to the patient's satisfaction.    Cath Lab Visit (complete for each Cath Lab visit)  Clinical Evaluation Leading to the Procedure:   ACS: No.  Non-ACS:    Anginal Classification: CCS II  Anti-ischemic medical therapy: Maximal Therapy (2 or more classes of medications)  Non-Invasive Test Results: No non-invasive testing performed  Prior CABG: No previous CABG   Glenetta Hew

## 2018-03-31 MED FILL — Heparin Sod (Porcine)-NaCl IV Soln 1000 Unit/500ML-0.9%: INTRAVENOUS | Qty: 1000 | Status: AC

## 2018-04-15 NOTE — Progress Notes (Signed)
Cardiology Office Note    Date:  04/16/2018   ID:  Lucas [name], DOB 1956-08-18, MRN [id number]  PCP:  Glenda Chroman, MD  Cardiologist: Carlyle Dolly, MD    Chief Complaint  Patient presents with  . Follow-up    3 week visit; s/p catheterization    History of Present Illness:    Lucas [name] is a 62 y.o. male with past medical history of CAD (catheterization in 01/2016 showing apical LAD stenosis with medical management recommended at that time), HTN, HLD, paroxysmal atrial fibrillation/flutter (s/p ablation in 05/2016), and Type II DM who presents to the office today for 3-week follow-up.  He was last examined by Dr. Harl Bowie on 03/25/2018 and reported an episode of chest discomfort several weeks prior which occurred with activity and improved with the use of sublingual nitroglycerin. He reported a similar episode the day prior to his appointment and it was recommended that he undergo a repeat cardiac catheterization for definitive evaluation. This was performed on 03/30/2018 and showed regression of his LAD stenosis now being at 50% with 60% mid RCA stenosis, 45% ostial LPDA stenosis, and 45% ostial second diagonal stenosis.  No culprit lesion was identified to explain his recent symptoms and he was discharged home on his current medication regimen.  In talking with the patient today, he reports having worsening lower extremity edema over the past 3-4 weeks with an associated 10 lb weight gain. He denies any associated changes in his respiratory status, orthopnea or PND. He did have one episode of chest discomfort approximately 2 weeks ago which resolved with the use of sublingual nitroglycerin. Denies any repeat episodes since.  He does not check his blood pressure regularly but it is well controlled at 122/70 during today's visit.   Past Medical History:  Diagnosis Date  . Arthritis   . CAD (coronary artery disease)    a. LHC on 01/29/16 which revealed significant apical LAD  stenosis, best treated medically. There was moderate disease in the D2 and mid nondominant RCA.[name] No PCI performed b. 03/2018: repeat cath showing regression of his LAD stenosis now being at 50% with 60% mid RCA stenosis, 45% ostial LPDA stenosis, and 45% ostial second diagonal stenosis.  . Cataract   . Diabetes mellitus    type 2  . GERD (gastroesophageal reflux disease)   . Hyperlipidemia   . Hypertension   . Obesity   . PAF (paroxysmal atrial fibrillation) ([place]) 01/2016  . Sleep apnea    has CPAP machine but not wear  . Tubular adenoma of colon 08/2012    Past Surgical History:  Procedure Laterality Date  . ANKLE SURGERY Left 2012   tendon repair  . CARDIAC CATHETERIZATION [name] 01/29/2016   Procedure: Left Heart Cath and Coronary Angiography;  Surgeon: [name], MD;  Location: [facility];  Service: Cardiovascular;  Laterality: [name];  . CATARACT EXTRACTION W/PHACO  11/11/2012   Procedure: CATARACT EXTRACTION PHACO AND INTRAOCULAR LENS PLACEMENT (IOC);  Surgeon: [name], MD;  Location: [facility];  Service: Ophthalmology;  Laterality: Right;  CDE:  5.56  . CATARACT EXTRACTION W/PHACO Left 12/15/2013   Procedure: CATARACT EXTRACTION PHACO AND INTRAOCULAR LENS PLACEMENT (IOC);  Surgeon: [name], MD;  Location: [facility];  Service: Ophthalmology;  Laterality: Left;  CDE:13.36  . CHOLECYSTECTOMY    . CHONDROPLASTY Right 05/28/2017   Procedure: RIGHT KNEE CHONDROPLASTY;  Surgeon: [name], MD;  Location: [facility];  Service: Orthopedics;  Laterality:  Right;  [name] COLONOSCOPY    . CYSTOSCOPY W/ URETERAL STENT PLACEMENT     right  . ELECTROPHYSIOLOGIC STUDY [name] 06/02/2016   Procedure: A-Flutter Ablation;  Surgeon: [name], MD;  Location: [facility];  Service: Cardiovascular;  Laterality: [name];  . KNEE ARTHROSCOPY WITH MEDIAL MENISECTOMY Right 05/28/2017   Procedure: KNEE ARTHROSCOPY WITH MEDIAL MENISECTOMY WITH EXTENSIVE SYNOVECTOMY;  Surgeon:  [name], MD;  Location: [facility];  Service: Orthopedics;  Laterality: Right;  . LEFT HEART CATH AND CORONARY ANGIOGRAPHY [name] 03/30/2018   Procedure: LEFT HEART CATH AND CORONARY ANGIOGRAPHY;  Surgeon: Leonie Man, MD;  Location: [facility];  Service: Cardiovascular;  Laterality: [name];  . left knee sugery Left    Arthroscopy  . NASAL SINUS SURGERY    . POLYPECTOMY    . TONSILLECTOMY      Current Medications: Outpatient Medications Prior to Visit  Medication Sig Dispense Refill  . acetaminophen (TYLENOL) 325 MG tablet Take 650 mg by mouth every 6 (six) hours as needed for moderate pain or headache.     [name] aspirin 81 MG tablet Take 81 mg by mouth daily.    [name] atorvastatin (LIPITOR) 80 MG tablet TAKE 1 TABLET (80 MG TOTAL) BY MOUTH DAILY AT 6 PM. 90 tablet 3  . benazepril (LOTENSIN) 20 MG tablet Take 20 mg by mouth daily.    . Canagliflozin-Metformin HCl (INVOKAMET) [phone] MG TABS Take 1 tablet by mouth 2 (two) times daily.    [name] diltiazem (CARDIZEM) 30 MG tablet Take 1 tab 2 times daily as needed for palpitations (Patient taking differently: Take 30 mg by mouth 2 (two) times daily as needed (for palpitations). ) 180 tablet 3  . furosemide (LASIX) 20 MG tablet Take 1 tablet (20 mg total) by mouth daily as needed. (Patient taking differently: Take 20 mg by mouth daily as needed for fluid. ) 90 tablet 3  . glimepiride (AMARYL) 4 MG tablet Take 4 mg by mouth 2 (two) times daily.     . isosorbide mononitrate (IMDUR) 60 MG 24 hr tablet Take 1.5 tablets (90 mg total) by mouth daily. 135 tablet 3  . metoprolol succinate (TOPROL-XL) 100 MG 24 hr tablet TAKE 1 TABLET BY MOUTH DAILY TAKE WITH OR IMMEDIATELY FOLLOWING A MEAL (Patient taking differently: TAKE 100 mg  TABLET BY MOUTH DAILY TAKE WITH OR IMMEDIATELY FOLLOWING A MEAL) 90 tablet 3  . nitroGLYCERIN (NITROSTAT) 0.4 MG SL tablet Place 1 tablet (0.4 mg total) under the tongue every 5 (five) minutes as needed for  chest pain. 25 tablet 3  . oxyCODONE-acetaminophen (ROXICET) 5-325 MG tablet Take 1-2 tablets by mouth every 4 (four) hours as needed. 60 tablet 0  . pantoprazole (PROTONIX) 40 MG tablet Take 40 mg by mouth daily.    [name] 20 MG TABS tablet TAKE 1 TABLET (20 MG TOTAL) BY MOUTH DAILY WITH SUPPER. 90 tablet 3   No facility-administered medications prior to visit.      Allergies:   Bee venom and Codeine   Social History   Socioeconomic History  . Marital status: Married    Spouse name: Not on file  . Number of children: Not on file  . Years of education: Not on file  . Highest education level: Not on file  Occupational History  . Not on file  Social Needs  . Financial resource strain: Not on file  . Food insecurity:    Worry: Not on file  Inability: Not on file  . Transportation needs:    Medical: Not on file    Non-medical: Not on file  Tobacco Use  . Smoking status: Never Smoker  . Smokeless tobacco: Never Used  Substance and Sexual Activity  . Alcohol use: Yes    Alcohol/week: 0.0 oz    Comment: rare occasion  . Drug use: No  . Sexual activity: Yes    Birth control/protection: None  Lifestyle  . Physical activity:    Days per week: Not on file    Minutes per session: Not on file  . Stress: Not on file  Relationships  . Social connections:    Talks on phone: Not on file    Gets together: Not on file    Attends religious service: Not on file    Active member of club or organization: Not on file    Attends meetings of clubs or organizations: Not on file    Relationship status: Not on file  Other Topics Concern  . Not on file  Social History Narrative  . Not on file     Family History:  The patient's family history includes Aneurysm in his maternal grandmother and mother; Cirrhosis in his father; Diabetes in his sister; Heart attack in his paternal grandmother and sister; Heart murmur in his mother; Hypertension in his mother; Stroke in his maternal  grandmother and sister.   Review of Systems:   Please see the history of present illness.     General:  No chills, fever, night sweats or weight changes.  Cardiovascular:  No chest pain, dyspnea on exertion, orthopnea, palpitations, paroxysmal nocturnal dyspnea. Positive for lower extremity edema.  Dermatological: No rash, lesions/masses Respiratory: No cough, dyspnea Urologic: No hematuria, dysuria Abdominal:   No nausea, vomiting, diarrhea, bright red blood per rectum, melena, or hematemesis Neurologic:  No visual changes, wkns, changes in mental status.  All other systems reviewed and are otherwise negative except as noted above.   Physical Exam:    VS:  BP 122/70 (BP Location: Right Arm)   Pulse 62   Ht 5\' 10"  (1.778 m)   Wt 283 lb (128.4 kg)   SpO2 93%   BMI 40.61 kg/m    General: Well developed, well nourished Caucasian male appearing in no acute distress. Head: Normocephalic, atraumatic, sclera non-icteric, no xanthomas, nares are without discharge.  Neck: No carotid bruits. JVD not elevated.  Lungs: Respirations regular and unlabored, without wheezes or rales.  Heart: Regular rate and rhythm. No S3 or S4.  No murmur, no rubs, or gallops appreciated. Abdomen: Soft, non-tender, non-distended with normoactive bowel sounds. No hepatomegaly. No rebound/guarding. No obvious abdominal masses. Msk:  Strength and tone appear normal for age. No joint deformities or effusions. Extremities: No clubbing or cyanosis. 1+ pitting edema up to mid-shins bilaterally.  Distal pedal pulses are 2+ bilaterally. Neuro: Alert and oriented X 3. Moves all extremities spontaneously. No focal deficits noted. Psych:  Responds to questions appropriately with a normal affect. Skin: No rashes or lesions noted  Wt Readings from Last 3 Encounters:  04/16/18 283 lb (128.4 kg)  03/30/18 270 lb (122.5 kg)  03/25/18 280 lb (127 kg)      Studies/Labs Reviewed:   EKG:  EKG is not ordered today.    Recent Labs: 03/25/2018: BUN 19; Creatinine, Ser 0.89; Hemoglobin 15.0; Platelets 168; Potassium 4.3; Sodium 139   Lipid Panel    Component Value Date/Time   CHOL 195 01/29/2016 0109   TRIG 133  01/29/2016 0109   HDL 39 (L) 01/29/2016 0109   CHOLHDL 5.0 01/29/2016 0109   VLDL 27 01/29/2016 0109   LDLCALC 129 (H) 01/29/2016 0109    Additional studies/ records that were reviewed today include:   Cardiac Catheterization: 03/30/2018  Dist LAD lesion is 50% stenosed. -Demonstrates regression of disease  Mid RCA (nondominant) stable lesion is 60% stenosed. Stable  Ost LPDA to LPDA lesion is 45% stenosed. Stable  Ost 2nd Diag lesion is 45% stenosed. Demonstrates progression of disease  The left ventricular systolic function is normal. The left ventricular ejection fraction is 55-65% by visual estimate.  LV end diastolic pressure is mildly elevated.   Angiographically stable to improved coronary disease with notably less significant disease in the diagonal branch as well as the apical LAD.  No potential culprit lesion to explain the patient's symptoms. Mildly elevated LVEDP.  Plan:  Discharge home after TR band removal and bedrest.   Restart Xarelto tonight.  Assessment:    1. Coronary artery disease involving native coronary artery of native heart with other form of angina pectoris ([place])   2. PAF (paroxysmal atrial fibrillation) ([place])   3. Essential hypertension   4. Hyperlipidemia LDL goal <70      Plan:   In order of problems listed above:  1. CAD - s/p catheterization in 01/2016 which showed apical LAD stenosis with medical management recommended. A repeat catheterization was performed last month in the setting of recurrent anginal symptoms and showed regression of his LAD stenosis now being at 50% with 60% mid RCA stenosis, 45% ostial LPDA stenosis, and 45% ostial second diagonal stenosis. No culprit lesion was identified and continued medical management was  recommended.  - denies any recurrent episodes of exertional chest pain.  - continue ASA, Imdur, BB, and statin therapy.   2. Paroxysmal Atrial Fibrillation/Flutter - s/p ablation in 05/2016. Has experienced occasional palpitations but denies any persistent arrhythmias.  - remains on Xarelto 20mg  daily for anticoagulation.   3. HTN - BP is well-controlled at 122/70 during today's visit.  - continue Imdur 90mg  daily and Toprol-XL 100mg  daily.   4. HLD - followed by PCP. FLP in 08/2017 showed total cholesterol of 111, HDL 43, Triglycerides 114, and LDL 45. AT goal of LDL < 70. - remains on Atorvastatin 80mg  daily.   5. Lower Extremity Edema - reports worsening lower extremity edema over the past 3-4 weeks with an associated 10 lb weight gain. LVEDP was mildly elevated at the time of his catheterization.  - He has an Rx for Lasix 20 mg as needed but has not taken this regularly over the past several weeks.  I recommended he take Lasix 20 mg daily for the next 7 days and call with his weight at that time. Sodium and fluid restriction were also reviewed.   Medication Adjustments/Labs and Tests Ordered: Current medicines are reviewed at length with the patient today.  Concerns regarding medicines are outlined above.  Medication changes, Labs and Tests ordered today are listed in the Patient Instructions below. Patient Instructions   Medication Instructions:  Take lasix 20 mg daily for next 7 days - then please call with weights & symptoms  Labwork: none  Testing/Procedures: none  Follow-Up: Your physician recommends that you schedule a follow-up appointment in: 4 months   Any Other Special Instructions Will Be Listed Below (If Applicable).  If you need a refill on your cardiac medications before your next appointment, please call your pharmacy.   Signed, [place]  Wynelle Fanny, PA-C  04/16/2018 3:20 PM    [facility]. [address] [place], [place]  [address] Phone: [phone]

## 2018-04-16 ENCOUNTER — Ambulatory Visit: Payer: 59 | Admitting: Student

## 2018-04-16 ENCOUNTER — Encounter: Payer: Self-pay | Admitting: Student

## 2018-04-16 VITALS — BP 122/70 | HR 62 | Ht 70.0 in | Wt 283.0 lb

## 2018-04-16 DIAGNOSIS — I10 Essential (primary) hypertension: Secondary | ICD-10-CM | POA: Diagnosis not present

## 2018-04-16 DIAGNOSIS — I25118 Atherosclerotic heart disease of native coronary artery with other forms of angina pectoris: Secondary | ICD-10-CM

## 2018-04-16 DIAGNOSIS — E785 Hyperlipidemia, unspecified: Secondary | ICD-10-CM | POA: Diagnosis not present

## 2018-04-16 DIAGNOSIS — I480 Paroxysmal atrial fibrillation: Secondary | ICD-10-CM

## 2018-04-16 DIAGNOSIS — R600 Localized edema: Secondary | ICD-10-CM

## 2018-04-16 NOTE — Patient Instructions (Addendum)
Medication Instructions:  Take lasix 20 mg daily for next 7 days - then please call with weights & symptoms  Labwork: none  Testing/Procedures: none  Follow-Up: Your physician recommends that you schedule a follow-up appointment in: 4 months    Any Other Special Instructions Will Be Listed Below (If Applicable).     If you need a refill on your cardiac medications before your next appointment, please call your pharmacy.     Two Gram Sodium Diet 2000 mg  What is Sodium? Sodium is a mineral found naturally in many foods. The most significant source of sodium in the diet is table salt, which is about 40% sodium.  Processed, convenience, and preserved foods also contain a large amount of sodium.  The body needs only 500 mg of sodium daily to function,  A normal diet provides more than enough sodium even if you do not use salt.  Why Limit Sodium? A build up of sodium in the body can cause thirst, increased blood pressure, shortness of breath, and water retention.  Decreasing sodium in the diet can reduce edema and risk of heart attack or stroke associated with high blood pressure.  Keep in mind that there are many other factors involved in these health problems.  Heredity, obesity, lack of exercise, cigarette smoking, stress and what you eat all play a role.  General Guidelines:  Do not add salt at the table or in cooking.  One teaspoon of salt contains over 2 grams of sodium.  Read food labels  Avoid processed and convenience foods  Ask your dietitian before eating any foods not dicussed in the menu planning guidelines  Consult your physician if you wish to use a salt substitute or a sodium containing medication such as antacids.  Limit milk and milk products to 16 oz (2 cups) per day.  Shopping Hints:  READ LABELS!! "Dietetic" does not necessarily mean low sodium.  Salt and other sodium ingredients are often added to foods during processing.   Menu Planning  Guidelines Food Group Choose More Often Avoid  Beverages (see also the milk group All fruit juices, low-sodium, salt-free vegetables juices, low-sodium carbonated beverages Regular vegetable or tomato juices, commercially softened water used for drinking or cooking  Breads and Cereals Enriched white, wheat, rye and pumpernickel bread, hard rolls and dinner rolls; muffins, cornbread and waffles; most dry cereals, cooked cereal without added salt; unsalted crackers and breadsticks; low sodium or homemade bread crumbs Bread, rolls and crackers with salted tops; quick breads; instant hot cereals; pancakes; commercial bread stuffing; self-rising flower and biscuit mixes; regular bread crumbs or cracker crumbs  Desserts and Sweets Desserts and sweets mad with mild should be within allowance Instant pudding mixes and cake mixes  Fats Butter or margarine; vegetable oils; unsalted salad dressings, regular salad dressings limited to 1 Tbs; light, sour and heavy cream Regular salad dressings containing bacon fat, bacon bits, and salt pork; snack dips made with instant soup mixes or processed cheese; salted nuts  Fruits Most fresh, frozen and canned fruits Fruits processed with salt or sodium-containing ingredient (some dried fruits are processed with sodium sulfites        Vegetables Fresh, frozen vegetables and low- sodium canned vegetables Regular canned vegetables, sauerkraut, pickled vegetables, and others prepared in brine; frozen vegetables in sauces; vegetables seasoned with ham, bacon or salt pork  Condiments, Sauces, Miscellaneous  Salt substitute with physician's approval; pepper, herbs, spices; vinegar, lemon or lime juice; hot pepper sauce; garlic powder, onion powder,  low sodium soy sauce (1 Tbs.); low sodium condiments (ketchup, chili sauce, mustard) in limited amounts (1 tsp.) fresh ground horseradish; unsalted tortilla chips, pretzels, potato chips, popcorn, salsa (1/4 cup) Any seasoning made  with salt including garlic salt, celery salt, onion salt, and seasoned salt; sea salt, rock salt, kosher salt; meat tenderizers; monosodium glutamate; mustard, regular soy sauce, barbecue, sauce, chili sauce, teriyaki sauce, steak sauce, Worcestershire sauce, and most flavored vinegars; canned gravy and mixes; regular condiments; salted snack foods, olives, picles, relish, horseradish sauce, catsup   Food preparation: Try these seasonings Meats:    Pork Sage, onion Serve with applesauce  Chicken Poultry seasoning, thyme, parsley Serve with cranberry sauce  Lamb Curry powder, rosemary, garlic, thyme Serve with mint sauce or jelly  Veal Marjoram, basil Serve with current jelly, cranberry sauce  Beef Pepper, bay leaf Serve with dry mustard, unsalted chive butter  Fish [place] leaf, dill Serve with unsalted lemon butter, unsalted parsley butter  Vegetables:    Asparagus Lemon juice   Broccoli Lemon juice   Carrots Mustard dressing parsley, mint, nutmeg, glazed with unsalted butter and sugar   Green beans Marjoram, lemon juice, nutmeg,dill seed   Tomatoes Basil, marjoram, onion   Spice /blend for [employer]" 4 tsp ground thyme 1 tsp ground sage 3 tsp ground rosemary 4 tsp ground marjoram   Test your knowledge 1. A product that says "Salt Free" may still contain sodium. True or False 2. Garlic Powder and Hot Pepper Sauce an be used as alternative seasonings.True or False 3. Processed foods have more sodium than fresh foods.  True or False 4. Canned Vegetables have less sodium than froze True or False  WAYS TO DECREASE YOUR SODIUM INTAKE 1. Avoid the use of added salt in cooking and at the table.  Table salt (and other prepared seasonings which contain salt) is probably one of the greatest sources of sodium in the diet.  Unsalted foods can gain flavor from the sweet, sour, and butter taste sensations of herbs and spices.  Instead of using salt for seasoning, try the following seasonings with the  foods listed.  Remember: how you use them to enhance natural food flavors is limited only by your creativity... Allspice-Meat, fish, eggs, fruit, peas, red and yellow vegetables Almond Extract-Fruit baked goods Anise Seed-Sweet breads, fruit, carrots, beets, cottage cheese, cookies (tastes like licorice) Basil-Meat, fish, eggs, vegetables, rice, vegetables salads, soups, sauces Bay Leaf-Meat, fish, stews, poultry Burnet-Salad, vegetables (cucumber-like flavor) Caraway Seed-Bread, cookies, cottage cheese, meat, vegetables, cheese, rice Cardamon-Baked goods, fruit, soups Celery Powder or seed-Salads, salad dressings, sauces, meatloaf, soup, bread.Do not use  celery salt Chervil-Meats, salads, fish, eggs, vegetables, cottage cheese (parsley-like flavor) Chili Power-Meatloaf, chicken cheese, corn, eggplant, egg dishes Chives-Salads cottage cheese, egg dishes, soups, vegetables, sauces Cilantro-Salsa, casseroles Cinnamon-Baked goods, fruit, pork, lamb, chicken, carrots Cloves-Fruit, baked goods, fish, pot roast, green beans, beets, carrots Coriander-Pastry, cookies, meat, salads, cheese (lemon-orange flavor) Cumin-Meatloaf, fish,cheese, eggs, cabbage,fruit pie (caraway flavor) [employer], fruit, eggs, fish, poultry, cottage cheese, vegetables Dill Seed-Meat, cottage cheese, poultry, vegetables, fish, salads, bread Fennel Seed-Bread, cookies, apples, pork, eggs, fish, beets, cabbage, cheese, Licorice-like flavor Garlic-(buds or powder) Salads, meat, poultry, fish, bread, butter, vegetables, potatoes.Do not  use garlic salt Ginger-Fruit, vegetables, baked goods, meat, fish, poultry Horseradish Root-Meet, vegetables, butter Lemon Juice or Extract-Vegetables, fruit, tea, baked goods, fish salads Mace-Baked goods fruit, vegetables, fish, poultry (taste like nutmeg) Maple Extract-Syrups Marjoram-Meat, chicken, fish, vegetables, breads, green salads (taste like Sage) Mint-Tea, lamb,  sherbet,  vegetables, desserts, carrots, cabbage Mustard, Dry or Seed-Cheese, eggs, meats, vegetables, poultry Nutmeg-Baked goods, fruit, chicken, eggs, vegetables, desserts Onion Powder-Meat, fish, poultry, vegetables, cheese, eggs, bread, rice salads (Do not use   Onion salt) Orange Extract-Desserts, baked goods Oregano-Pasta, eggs, cheese, onions, pork, lamb, fish, chicken, vegetables, green salads Paprika-Meat, fish, poultry, eggs, cheese, vegetables Parsley Flakes-Butter, vegetables, meat fish, poultry, eggs, bread, salads (certain forms may   Contain sodium Pepper-Meat fish, poultry, vegetables, eggs Peppermint Extract-Desserts, baked goods Poppy Seed-Eggs, bread, cheese, fruit dressings, baked goods, noodles, vegetables, cottage  [employer], poultry, meat, fish, cauliflower, turnips,eggs bread Saffron-Rice, bread, veal, chicken, fish, eggs Sage-Meat, fish, poultry, onions, eggplant, tomateos, pork, stews Savory-Eggs, salads, poultry, meat, rice, vegetables, soups, pork Tarragon-Meat, poultry, fish, eggs, butter, vegetables (licorice-like flavor)  Thyme-Meat, poultry, fish, eggs, vegetables, (clover-like flavor), sauces, soups Tumeric-Salads, butter, eggs, fish, rice, vegetables (saffron-like flavor) Vanilla Extract-Baked goods, candy Vinegar-Salads, vegetables, meat marinades Walnut Extract-baked goods, candy  2. Choose your Foods Wisely   The following is a list of foods to avoid which are high in sodium:  Meats-Avoid all smoked, canned, salt cured, dried and kosher meat and fish as well as Anchovies   [employer] meats:Bologna, Liverwurst, Pastrami Canned meat or fish  Marinated herring Caviar    Pepperoni Corned Beef   Pizza Dried chipped beef  Salami Frozen breaded fish or meat Salt pork Frankfurters or hot dogs  Sardines Gefilte fish   Sausage Ham (boiled ham, Proscuitto Smoked butt    spiced ham)   Spam      TV  Dinners Vegetables Canned vegetables (Regular) Relish Canned mushrooms  Sauerkraut Olives    Tomato juice Pickles  Bakery and Dessert Products Canned puddings  Cream pies Cheesecake   Decorated cakes Cookies  Beverages/Juices Tomato juice, regular  Gatorade   V-8 vegetable juice, regular  Breads and Cereals Biscuit mixes   Salted potato chips, corn chips, pretzels Bread stuffing mixes  Salted crackers and rolls Pancake and waffle mixes Self-rising flour  Seasonings Accent    Meat sauces Barbecue sauce  Meat tenderizer Catsup    Monosodium glutamate (MSG) Celery salt   Onion salt Chili sauce   Prepared mustard Garlic salt   Salt, seasoned salt, sea salt Gravy mixes   Soy sauce Horseradish   Steak sauce Ketchup   Tartar sauce Lite salt    Teriyaki sauce Marinade mixes   Worcestershire sauce  Others Baking powder   Cocoa and cocoa mixes Baking soda   Commercial casserole mixes Candy-caramels, chocolate  Dehydrated soups    Bars, fudge,nougats  Instant rice and pasta mixes Canned broth or soup  Maraschino cherries Cheese, aged and processed cheese and cheese spreads  Learning Assessment Quiz  Indicated T (for True) or F (for False) for each of the following statements:  1. _____ Fresh fruits and vegetables and unprocessed grains are generally low in sodium 2. _____ Water may contain a considerable amount of sodium, depending on the source 3. _____ You can always tell if a food is high in sodium by tasting it 4. _____ Certain laxatives my be high in sodium and should be avoided unless prescribed   by a physician or pharmacist 5. _____ Salt substitutes may be used freely by anyone on a sodium restricted diet 6. _____ Sodium is present in table salt, food additives and as a natural component of   most foods 7. _____ Table salt is approximately 90% sodium 8. _____  Limiting sodium intake may help prevent excess fluid accumulation in the body 9. _____ On a  sodium-restricted diet, seasonings such as bouillon soy sauce, and    cooking wine should be used in place of table salt 10. _____ On an ingredient list, a product which lists monosodium glutamate as the first   ingredient is an appropriate food to include on a low sodium diet  Circle the best answer(s) to the following statements (Hint: there may be more than one correct answer)  11. On a low-sodium diet, some acceptable snack items are:    A. Olives  F. Bean dip   K. Grapefruit juice    B. Salted Pretzels G. Commercial Popcorn   L. Canned peaches    C. Carrot Sticks  H. Bouillon   M. Unsalted nuts   D. [place] fries  I. Peanut butter crackers N. Salami   E. Sweet pickles J. Tomato Juice   O. Pizza  12.  Seasonings that may be used freely on a reduced - sodium diet include   A. Lemon wedges F.Monosodium glutamate K. Celery seed    B.Soysauce   G. Pepper   L. Mustard powder   C. Sea salt  H. Cooking wine  M. Onion flakes   D. Vinegar  E. Prepared horseradish N. Salsa   E. Sage   J. Worcestershire sauce  O. Chutney

## 2018-06-10 ENCOUNTER — Other Ambulatory Visit: Payer: Self-pay | Admitting: Cardiology

## 2018-06-11 ENCOUNTER — Other Ambulatory Visit: Payer: Self-pay | Admitting: Cardiology

## 2018-06-26 ENCOUNTER — Other Ambulatory Visit: Payer: Self-pay | Admitting: Cardiovascular Disease

## 2018-07-23 ENCOUNTER — Telehealth: Payer: Self-pay | Admitting: Cardiology

## 2018-07-23 NOTE — Telephone Encounter (Signed)
   He reported occasional palpitations at the time of his last office visit in 04/2018.  Has he been able to check his heart rate or blood pressure since being out of town? Any chest pain or associated dyspnea? He has an Rx for short-acting Cardizem 30mg  to take as needed. Has he utilized this?  Signed, Erma Heritage, PA-C 07/23/2018, 9:03 AM Pager: [phone]

## 2018-07-23 NOTE — Telephone Encounter (Signed)
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   States he isn't feeling it now, but yesterday it felt like it was starting and stopping.    Please give pt a call

## 2018-07-23 NOTE — Telephone Encounter (Signed)
   The combination of the two certainly could have triggered some extra beats (PAC's versus PAC's) or perhaps a brief episode of atrial fibrillation. 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 Would recommend using SL NTG as needed. If having to take more than 3 tablets, then he should proceed to the ED for evaluation.   If he is still having an irregular HR upon returning home, he should come to the office for a repeat EKG to assess his rhythm. If he thinks he is back in a regular rhythm, would just continue with his current medication regimen and use PRN Diltiazem.   Signed, Erma Heritage, PA-C 07/23/2018, 12:21 PM Pager: [phone]

## 2018-07-23 NOTE — Telephone Encounter (Signed)
Patient had some alcohol the night before, coffee in the am and then noted irregular beats.He did not have BP monitor with him, denies rate was fast, just irregular. 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 He has not used NTG.I explained how he can use it and keep diary of usage.He is not c/o irregular HB now.He does carry the cardizem 30 mg with him but did not use it yesterday.

## 2018-07-23 NOTE — Telephone Encounter (Signed)
Has hx of PAF, is on Xarelto.I will forward to provider for discussion

## 2018-08-09 ENCOUNTER — Telehealth: Payer: Self-pay | Admitting: Cardiology

## 2018-08-09 NOTE — Telephone Encounter (Signed)
149/94 BP yest - was outside at church for family day.  Once he got cool & went home - stated that he felt better.  Stated that he did take one of the Diltiazem 30mg  tabs that day.  No c/o chest pain, dizziness, or SOB today.

## 2018-08-09 NOTE — Telephone Encounter (Signed)
Likely from being outside in the sun/heat. Would just monitor for now, if recurrent symptoms have him contact us back   Zandra Abts MD

## 2018-08-09 NOTE — Telephone Encounter (Signed)
Patient's wife called stating that yesterday they had an outing outside when patient became very weak, felt faint. EMS was called. They evaluated the patient and said everything looked good. He was not transported to hospital. Patient wanted Dr.Branch to know this. Cell # [phone].

## 2018-08-09 NOTE — Telephone Encounter (Signed)
Patient notified and verbalized understanding. 

## 2018-09-02 ENCOUNTER — Encounter: Payer: Self-pay | Admitting: *Deleted

## 2018-09-02 ENCOUNTER — Telehealth: Payer: Self-pay | Admitting: Cardiology

## 2018-09-02 NOTE — Telephone Encounter (Signed)
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  They are going to request Dr Woody Seller to send results to [place], but want Dr Harl Bowie made aware and want his opinion

## 2018-09-02 NOTE — Telephone Encounter (Signed)
CT scan received

## 2018-09-02 NOTE — Telephone Encounter (Signed)
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

## 2018-09-02 NOTE — Telephone Encounter (Signed)
Pt called Dr Woody Seller who will see him in the morning

## 2018-09-02 NOTE — Telephone Encounter (Signed)
Wife called back to ask about her working.   He has had three episodes where he gets really dizzy and weak and light headed.  They are really concerned about something happening to him if he drives or goes to work  Dr Woody Seller office told patient they would fax over test right away to [place].

## 2018-09-03 NOTE — Telephone Encounter (Signed)
The aneurysm is small and not something to be concerned at this time, we will just need to monitor it with a repeat CT next year. Would not cause any symptoms, agree with pcp evaluating his dizziness   Zandra Abts MD

## 2018-09-03 NOTE — Telephone Encounter (Signed)
Pt and wife made aware and appreciative

## 2018-11-05 ENCOUNTER — Encounter: Payer: Self-pay | Admitting: *Deleted

## 2018-11-05 ENCOUNTER — Ambulatory Visit: Payer: 59 | Admitting: Cardiology

## 2018-11-05 ENCOUNTER — Encounter: Payer: Self-pay | Admitting: Cardiology

## 2018-11-05 VITALS — BP 140/80 | HR 74 | Ht 70.0 in | Wt 273.0 lb

## 2018-11-05 DIAGNOSIS — I712 Thoracic aortic aneurysm, without rupture, unspecified: Secondary | ICD-10-CM

## 2018-11-05 DIAGNOSIS — I25118 Atherosclerotic heart disease of native coronary artery with other forms of angina pectoris: Secondary | ICD-10-CM

## 2018-11-05 DIAGNOSIS — I480 Paroxysmal atrial fibrillation: Secondary | ICD-10-CM

## 2018-11-05 NOTE — Patient Instructions (Addendum)

## 2018-11-05 NOTE — Progress Notes (Signed)
Clinical Summary Lucas [name] is a 62 y.o.male seen today for follow up of the following medical problems.   1. CAD - previous chest pain episode occurred in the setting of afib with RVR - cath 01/29/16 with apical LAD disease, treated medically. Moderate RCA and D2 disease.   - 03/2018 cath distal LAD 50%, mid RCA 60%, LPDA 45%, D2 45%, mildly elevated LVEDP of 18  - compliant with meds - no significant chest pain since last visit.     2. Aflutter/Afib - CHADS2Vasc score of 2, on xarelto - s/p aflutter ablation 05/2016 by Dr Curt Bears.   - no recent palpitations.     3. Aortic aneurysm - 08/2018 CT ordered by pcp showed 4.3 ascending aortic aneurysm     Past Medical History:  Diagnosis Date  . Arthritis   . CAD (coronary artery disease)    a. LHC on 01/29/16 which revealed significant apical LAD stenosis, best treated medically. There was moderate disease in the D2 and mid nondominant RCA.[name] No PCI performed b. 03/2018: repeat cath showing regression of his LAD stenosis now being at 50% with 60% mid RCA stenosis, 45% ostial LPDA stenosis, and 45% ostial second diagonal stenosis.  . Cataract   . Diabetes mellitus    type 2  . GERD (gastroesophageal reflux disease)   . Hyperlipidemia   . Hypertension   . Obesity   . PAF (paroxysmal atrial fibrillation) ([place]) 01/2016  . Sleep apnea    has CPAP machine but not wear  . Tubular adenoma of colon 08/2012     Allergies  Allergen Reactions  . Bee Venom Swelling  . Codeine Nausea Only     Current Outpatient Medications  Medication Sig Dispense Refill  . acetaminophen (TYLENOL) 325 MG tablet Take 650 mg by mouth every 6 (six) hours as needed for moderate pain or headache.     [name] aspirin 81 MG tablet Take 81 mg by mouth daily.    [name] atorvastatin (LIPITOR) 80 MG tablet TAKE 1 TABLET (80 MG TOTAL) BY MOUTH DAILY AT 6 PM. 90 tablet 3  . benazepril (LOTENSIN) 20 MG tablet Take 20 mg by mouth daily.    . Canagliflozin-Metformin  HCl (INVOKAMET) 3051105350 MG TABS Take 1 tablet by mouth 2 (two) times daily.    [name] diltiazem (CARDIZEM) 30 MG tablet Take 1 tab 2 times daily as needed for palpitations (Patient taking differently: Take 30 mg by mouth 2 (two) times daily as needed (for palpitations). ) 180 tablet 3  . furosemide (LASIX) 20 MG tablet Take 1 tablet (20 mg total) by mouth daily as needed. (Patient taking differently: Take 20 mg by mouth daily as needed for fluid. ) 90 tablet 3  . glimepiride (AMARYL) 4 MG tablet Take 4 mg by mouth 2 (two) times daily.     . isosorbide mononitrate (IMDUR) 60 MG 24 hr tablet Take 1.5 tablets (90 mg total) by mouth daily. 135 tablet 3  . isosorbide mononitrate (IMDUR) 60 MG 24 hr tablet Take 1.5 tablets (90 mg total) by mouth daily. 135 tablet 3  . metoprolol succinate (TOPROL-XL) 100 MG 24 hr tablet TAKE 100 mg  TABLET BY MOUTH DAILY TAKE WITH OR IMMEDIATELY FOLLOWING A MEAL 90 tablet 3  . nitroGLYCERIN (NITROSTAT) 0.4 MG SL tablet Place 1 tablet (0.4 mg total) under the tongue every 5 (five) minutes as needed for chest pain. 25 tablet 3  . oxyCODONE-acetaminophen (ROXICET) 5-325 MG tablet Take 1-2 tablets by mouth  every 4 (four) hours as needed. 60 tablet 0  . pantoprazole (PROTONIX) 40 MG tablet Take 40 mg by mouth daily.    [name] 20 MG TABS tablet TAKE 1 TABLET (20 MG TOTAL) BY MOUTH DAILY WITH SUPPER. 90 tablet 3   No current facility-administered medications for this visit.      Past Surgical History:  Procedure Laterality Date  . ANKLE SURGERY Left 2012   tendon repair  . CARDIAC CATHETERIZATION [name] 01/29/2016   Procedure: Left Heart Cath and Coronary Angiography;  Surgeon: [name], MD;  Location: [facility];  Service: Cardiovascular;  Laterality: [name];  . CATARACT EXTRACTION W/PHACO  11/11/2012   Procedure: CATARACT EXTRACTION PHACO AND INTRAOCULAR LENS PLACEMENT (IOC);  Surgeon: Tonny Citlalic Norlander, MD;  Location: [facility];  Service: Ophthalmology;  Laterality: Right;   CDE:  5.56  . CATARACT EXTRACTION W/PHACO Left 12/15/2013   Procedure: CATARACT EXTRACTION PHACO AND INTRAOCULAR LENS PLACEMENT (IOC);  Surgeon: Tonny Yazir Koerber, MD;  Location: [facility];  Service: Ophthalmology;  Laterality: Left;  CDE:13.36  . CHOLECYSTECTOMY    . CHONDROPLASTY Right 05/28/2017   Procedure: RIGHT KNEE CHONDROPLASTY;  Surgeon: [name], MD;  Location: [facility];  Service: Orthopedics;  Laterality: Right;  . COLONOSCOPY    . CYSTOSCOPY W/ URETERAL STENT PLACEMENT     right  . ELECTROPHYSIOLOGIC STUDY [name] 06/02/2016   Procedure: A-Flutter Ablation;  Surgeon: [name], MD;  Location: [facility];  Service: Cardiovascular;  Laterality: [name];  . KNEE ARTHROSCOPY WITH MEDIAL MENISECTOMY Right 05/28/2017   Procedure: KNEE ARTHROSCOPY WITH MEDIAL MENISECTOMY WITH EXTENSIVE SYNOVECTOMY;  Surgeon: [name], MD;  Location: [facility];  Service: Orthopedics;  Laterality: Right;  . LEFT HEART CATH AND CORONARY ANGIOGRAPHY [name] 03/30/2018   Procedure: LEFT HEART CATH AND CORONARY ANGIOGRAPHY;  Surgeon: Leonie Man, MD;  Location: [facility];  Service: Cardiovascular;  Laterality: [name];  . left knee sugery Left    Arthroscopy  . NASAL SINUS SURGERY    . POLYPECTOMY    . TONSILLECTOMY       Allergies  Allergen Reactions  . Bee Venom Swelling  . Codeine Nausea Only      Family History  Problem Relation Age of Onset  . Heart murmur Mother   . Aneurysm Mother   . Hypertension Mother   . Cirrhosis Father   . Heart attack Sister   . Diabetes Sister   . Aneurysm Maternal Grandmother   . Stroke Maternal Grandmother   . Heart attack Paternal Grandmother   . Stroke Sister   . Colon cancer Neg Hx   . Rectal cancer Neg Hx   . Stomach cancer Neg Hx      Social History Lucas [name] reports that he has never smoked. He has never used smokeless tobacco. Lucas [name] reports current alcohol use.   Review of  Systems CONSTITUTIONAL: No weight loss, fever, chills, weakness or fatigue.  HEENT: Eyes: No visual loss, blurred vision, double vision or yellow sclerae.No hearing loss, sneezing, congestion, runny nose or sore throat.  SKIN: No rash or itching.  CARDIOVASCULAR: per hpi RESPIRATORY: No shortness of breath, cough or sputum.  GASTROINTESTINAL: No anorexia, nausea, vomiting or diarrhea. No abdominal pain or blood.  GENITOURINARY: No burning on urination, no polyuria NEUROLOGICAL: No headache, dizziness, syncope, paralysis, ataxia, numbness or tingling in the extremities. No change in bowel or bladder control.  MUSCULOSKELETAL: No muscle, back pain,  joint pain or stiffness.  LYMPHATICS: No enlarged nodes. No history of splenectomy.  PSYCHIATRIC: No history of depression or anxiety.  ENDOCRINOLOGIC: No reports of sweating, cold or heat intolerance. No polyuria or polydipsia.  [name]   Physical Examination Vitals:   11/05/18 1123  BP: 140/80  Pulse: 74  SpO2: 95%   Vitals:   11/05/18 1123  Weight: 273 lb (123.8 kg)  Height: 5\' 10"  (1.778 m)    Gen: resting comfortably, no acute distress HEENT: no scleral icterus, pupils equal round and reactive, no palptable cervical adenopathy,  CV: RRR, no m/r/g, no jvd Resp: Clear to auscultation bilaterally GI: abdomen is soft, non-tender, non-distended, normal bowel sounds, no hepatosplenomegaly MSK: extremities are warm, no edema.  Skin: warm, no rash Neuro:  no focal deficits Psych: appropriate affect   Diagnostic Studies 01/2016 echo Study Conclusions  - Left ventricle: The cavity size was mildly dilated. Wall  thickness was increased in a pattern of mild LVH. Systolic  function was normal. The estimated ejection fraction was in the  range of 55% to 60%.  01/2016 cath 1. Dist LAD lesion, 80% stenosed. 2. Mid RCA lesion, 60% stenosed. 3. Ost 2nd Diag to 2nd Diag lesion, 60% stenosed. 4. 2nd Mrg lesion, 30% stenosed. 5. Mid LAD  lesion, 25% stenosed. 6. LPDA lesion, 45% stenosed.   Significant apical LAD stenosis, best treated with medication. Moderate disease in the second diagonal and mid nondominant RCA.  Normal left ventricular systolic function. Normal left ventricular filling pressures.  Post Cath Recommendations:   Medical therapy of both atrial fibrillation and coronary artery disease. Percutaneous intervention is not currently indicated for coronary disease.  03/2018 cath  Dist LAD lesion is 50% stenosed. -Demonstrates regression of disease  Mid RCA (nondominant) stable lesion is 60% stenosed. Stable  Ost LPDA to LPDA lesion is 45% stenosed. Stable  Ost 2nd Diag lesion is 45% stenosed. Demonstrates progression of disease  The left ventricular systolic function is normal. The left ventricular ejection fraction is 55-65% by visual estimate.  LV end diastolic pressure is mildly elevated.   Angiographically stable to improved coronary disease with notably less significant disease in the diagonal Deloy Archey as well as the apical LAD.  No potential culprit lesion to explain the patient's symptoms. Mildly elevated LVEDP.  Plan:  Discharge home after TR band removal and bedrest.   Restart Xarelto tonight.  Assessment and Plan   1. CAD - recent chest pain symptoms have resolved, his recent cath showed mild to moderate disease only - continue medical therapy  2. Aflutter/Afib - no significant symptoms, continue current meds   3. Aortic aneurysm - mild ascending aortic aneurysm, will need repeat CTA 08/2019  F/u 6 months   Arnoldo Lenis, M.D.

## 2019-01-20 ENCOUNTER — Telehealth: Payer: Self-pay | Admitting: *Deleted

## 2019-01-20 DIAGNOSIS — R002 Palpitations: Secondary | ICD-10-CM

## 2019-01-20 NOTE — Telephone Encounter (Signed)
Per Dr Harl Bowie 2 week event monitor for palpitations - enrolled at [employer] - pt wife made aware

## 2019-02-03 ENCOUNTER — Other Ambulatory Visit: Payer: Self-pay | Admitting: *Deleted

## 2019-02-03 MED ORDER — RIVAROXABAN 20 MG PO TABS: 20.0000 mg | ORAL_TABLET | Freq: Every day | ORAL | 3 refills | Status: DC

## 2019-02-21 ENCOUNTER — Other Ambulatory Visit: Payer: Self-pay | Admitting: Cardiology

## 2019-02-21 MED ORDER — FUROSEMIDE 20 MG PO TABS: 20.0000 mg | ORAL_TABLET | Freq: Every day | ORAL | 3 refills | Status: DC | PRN

## 2019-02-21 NOTE — Telephone Encounter (Signed)
Needs refill on Lasix 20 mg - [facility]

## 2019-02-21 NOTE — Telephone Encounter (Signed)
Refilled lasix

## 2019-02-28 ENCOUNTER — Telehealth: Payer: Self-pay | Admitting: *Deleted

## 2019-02-28 ENCOUNTER — Encounter: Payer: Self-pay | Admitting: *Deleted

## 2019-02-28 ENCOUNTER — Telehealth: Payer: Self-pay | Admitting: Cardiology

## 2019-02-28 MED ORDER — METOPROLOL SUCCINATE ER 100 MG PO TB24: ORAL_TABLET | ORAL | 1 refills | Status: DC

## 2019-02-28 NOTE — Telephone Encounter (Signed)
-----   Message from Arnoldo Lenis, MD sent at 02/28/2019  1:30 PM EDT ----- Monitor does show some short episodes of afib. Can he increase his Toprol to 150mg  daily (verify he is currently taking 100mg  daily, can continue diltiazem prn).Update [place] at the end of the week on his symptoms   Zandra Abts MD

## 2019-02-28 NOTE — Telephone Encounter (Signed)
Pt aware that we will send letter via email

## 2019-02-28 NOTE — Telephone Encounter (Signed)
Pt says has episodes Thursday and today lasting for over an hour with some chest pain - denies any active chest pain/SOB/dizziness. says this morning BP was 170/110 HR 120-135 with some chest pain and took an extra diltiazem today and last Thursday and this seems to have helped. says BP now is 115/80 HR 60s. Also requesting monitor results.

## 2019-02-28 NOTE — Telephone Encounter (Signed)
Thanks, just out the result note with Awilda Metro Journii Nierman MD

## 2019-02-28 NOTE — Telephone Encounter (Signed)
See phone result note for event monitor

## 2019-02-28 NOTE — Telephone Encounter (Signed)
Ok to provid note excusing him from work today and note clearing him to return tomorrow. If symptoms are ongoing and needs more time out of work call us back   Zandra Abts MD

## 2019-02-28 NOTE — Telephone Encounter (Signed)
Pt confirmed he is currently taking 100 mg daily of Toprol XL - will increase 150 mg daily (requested [facility] rx be sent to [facility]) and update [place] at the end of the week. Pt called out of work for today with symptoms and his employer ([employer]) requesting a letter stating that he can return to work - will forward if ok to provide letter

## 2019-02-28 NOTE — Telephone Encounter (Signed)
Patient would like to speak with nurse regarding a couple episodes of A-Fib/tg

## 2019-03-04 ENCOUNTER — Other Ambulatory Visit: Payer: Self-pay | Admitting: Cardiology

## 2019-03-04 MED ORDER — ATORVASTATIN CALCIUM 80 MG PO TABS: 80.0000 mg | ORAL_TABLET | Freq: Every day | ORAL | 3 refills | Status: DC

## 2019-03-04 NOTE — Telephone Encounter (Signed)
°*  STAT* If patient is at the pharmacy, call can be transferred to refill team.   1. Which medications need to be refilled? atorvastatin (LIPITOR) 80 MG tablet    2. Which pharmacy/location (including street and city if local pharmacy) is medication to be sent to? [facility]  3. Do they need a 30 day or 90 day supply?

## 2019-04-27 ENCOUNTER — Telehealth: Payer: Self-pay | Admitting: Cardiology

## 2019-04-27 NOTE — Telephone Encounter (Signed)
Virtual Visit Pre-Appointment Phone Call  "(Name), I am calling you today to discuss your upcoming appointment. We are currently trying to limit exposure to the virus that causes COVID-19 by seeing patients at home rather than in the office."  1. "What is the BEST phone number to call the day of the visit?" - include this in appointment notes  2. Do you have or have access to (through a family member/friend) a smartphone with video capability that we can use for your visit?" a. If yes - list this number in appt notes as cell (if different from BEST phone #) and list the appointment type as a VIDEO visit in appointment notes b. If no - list the appointment type as a PHONE visit in appointment notes  3. Confirm consent - "In the setting of the current Covid19 crisis, you are scheduled for a (phone or video) visit with your provider on (date) at (time).  Just as we do with many in-office visits, in order for you to participate in this visit, we must obtain consent.  If you'd like, I can send this to your mychart (if signed up) or email for you to review.  Otherwise, I can obtain your verbal consent now.  All virtual visits are billed to your insurance company just like a normal visit would be.  By agreeing to a virtual visit, we'd like you to understand that the technology does not allow for your provider to perform an examination, and thus may limit your provider's ability to fully assess your condition. If your provider identifies any concerns that need to be evaluated in person, we will make arrangements to do so.  Finally, though the technology is pretty good, we cannot assure that it will always work on either your or our end, and in the setting of a video visit, we may have to convert it to a phone-only visit.  In either situation, we cannot ensure that we have a secure connection.  Are you willing to proceed?" STAFF: Did the patient verbally acknowledge consent to telehealth visit? Document  YES/NO here: yes  4. Advise patient to be prepared - "Two hours prior to your appointment, go ahead and check your blood pressure, pulse, oxygen saturation, and your weight (if you have the equipment to check those) and write them all down. When your visit starts, your provider will ask you for this information. If you have an Apple Watch or Kardia device, please plan to have heart rate information ready on the day of your appointment. Please have a pen and paper handy nearby the day of the visit as well."  5. Give patient instructions for MyChart download to smartphone OR Doximity/Doxy.me as below if video visit (depending on what platform provider is using)  6. Inform patient they will receive a phone call 15 minutes prior to their appointment time (may be from unknown caller ID) so they should be prepared to answer    TELEPHONE CALL NOTE  Shyne Lehrke Inscoe has been deemed a candidate for a follow-up tele-health visit to limit community exposure during the Covid-19 pandemic. I spoke with the patient via phone to ensure availability of phone/video source, confirm preferred email & phone number, and discuss instructions and expectations.  I reminded Dennise Bamber Lodato to be prepared with any vital sign and/or heart rhythm information that could potentially be obtained via home monitoring, at the time of his visit. I reminded Saim Almanza Nieto to expect a phone call prior to  his visit.  Weston Anna 04/27/2019 3:34 PM   INSTRUCTIONS FOR DOWNLOADING THE MYCHART APP TO SMARTPHONE  - The patient must first make sure to have activated MyChart and know their login information - If Apple, go to [employer] and type in MyChart in the search bar and download the app. If Android, ask patient to go to [employer] and type in [place] in the search bar and download the app. The app is free but as with any other app downloads, their phone may require them to verify saved payment information or Apple/Android  password.  - The patient will need to then log into the app with their MyChart username and password, and select [facility] as their healthcare provider to link the account. When it is time for your visit, go to the MyChart app, find appointments, and click Begin Video Visit. Be sure to Select Allow for your device to access the Microphone and Camera for your visit. You will then be connected, and your provider will be with you shortly.  **If they have any issues connecting, or need assistance please contact MyChart service desk [phone])**  **If using a computer, in order to ensure the best quality for their visit they will need to use either of the following Internet Browsers: [employer], or Google Chrome**  IF USING DOXIMITY or DOXY.ME - The patient will receive a link just prior to their visit by text.     FULL LENGTH CONSENT FOR TELE-HEALTH VISIT   I hereby voluntarily request, consent and authorize [facility] and its employed or contracted physicians, physician assistants, nurse practitioners or other licensed health care professionals (the Practitioner), to provide me with telemedicine health care services (the Services") as deemed necessary by the treating Practitioner. I acknowledge and consent to receive the Services by the Practitioner via telemedicine. I understand that the telemedicine visit will involve communicating with the Practitioner through live audiovisual communication technology and the disclosure of certain medical information by electronic transmission. I acknowledge that I have been given the opportunity to request an in-person assessment or other available alternative prior to the telemedicine visit and am voluntarily participating in the telemedicine visit.  I understand that I have the right to withhold or withdraw my consent to the use of telemedicine in the course of my care at any time, without affecting my right to future care or treatment,  and that the Practitioner or I may terminate the telemedicine visit at any time. I understand that I have the right to inspect all information obtained and/or recorded in the course of the telemedicine visit and may receive copies of available information for a reasonable fee.  I understand that some of the potential risks of receiving the Services via telemedicine include:   Delay or interruption in medical evaluation due to technological equipment failure or disruption;  Information transmitted may not be sufficient (e.g. poor resolution of images) to allow for appropriate medical decision making by the Practitioner; and/or   In rare instances, security protocols could fail, causing a breach of personal health information.  Furthermore, I acknowledge that it is my responsibility to provide information about my medical history, conditions and care that is complete and accurate to the best of my ability. I acknowledge that Practitioner's advice, recommendations, and/or decision may be based on factors not within their control, such as incomplete or inaccurate data provided by me or distortions of diagnostic images or specimens that may result from electronic transmissions. I  understand that the practice of medicine is not an exact science and that Practitioner makes no warranties or guarantees regarding treatment outcomes. I acknowledge that I will receive a copy of this consent concurrently upon execution via email to the email address I last provided but may also request a printed copy by calling the office of [facility].    I understand that my insurance will be billed for this visit.   I have read or had this consent read to me.  I understand the contents of this consent, which adequately explains the benefits and risks of the Services being provided via telemedicine.   I have been provided ample opportunity to ask questions regarding this consent and the Services and have had my questions  answered to my satisfaction.  I give my informed consent for the services to be provided through the use of telemedicine in my medical care  By participating in this telemedicine visit I agree to the above.

## 2019-05-04 ENCOUNTER — Telehealth (INDEPENDENT_AMBULATORY_CARE_PROVIDER_SITE_OTHER): Payer: 59 | Admitting: Cardiology

## 2019-05-04 ENCOUNTER — Encounter: Payer: Self-pay | Admitting: *Deleted

## 2019-05-04 ENCOUNTER — Encounter: Payer: Self-pay | Admitting: Cardiology

## 2019-05-04 VITALS — BP 144/79 | HR 60 | Ht 70.0 in | Wt 268.0 lb

## 2019-05-04 DIAGNOSIS — R002 Palpitations: Secondary | ICD-10-CM | POA: Diagnosis not present

## 2019-05-04 DIAGNOSIS — I10 Essential (primary) hypertension: Secondary | ICD-10-CM | POA: Diagnosis not present

## 2019-05-04 DIAGNOSIS — I480 Paroxysmal atrial fibrillation: Secondary | ICD-10-CM

## 2019-05-04 DIAGNOSIS — I2510 Atherosclerotic heart disease of native coronary artery without angina pectoris: Secondary | ICD-10-CM | POA: Diagnosis not present

## 2019-05-04 MED ORDER — ISOSORBIDE MONONITRATE ER 60 MG PO TB24: 90.0000 mg | ORAL_TABLET | Freq: Every day | ORAL | 1 refills | Status: DC

## 2019-05-04 NOTE — Progress Notes (Signed)
Virtual Visit via Video Note   This visit type was conducted due to national recommendations for restrictions regarding the COVID-19 Pandemic (e.g. social distancing) in an effort to limit this patient's exposure and mitigate transmission in our community.  Due to his co-morbid illnesses, this patient is at least at moderate risk for complications without adequate follow up.  This format is felt to be most appropriate for this patient at this time.  All issues noted in this document were discussed and addressed.  A limited physical exam was performed with this format.  Please refer to the patient's chart for his consent to telehealth for [facility].   Date:  05/04/2019   ID:  Lucas [name], DOB [birth year], MRN [id number]  Patient Location: Home Provider Location: Office  PCP:  Glenda Chroman, MD  Cardiologist:  Carlyle Dolly, MD  Electrophysiologist:  None   Evaluation Performed:  Follow-Up Visit  Chief Complaint:  Follow up visit  History of Present Illness:    Lucas [name] is a 63 y.o. male seen today for follow up of the following medical problems.   1. CAD - previous chest pain episode occurred in the setting of afib with RVR - cath 01/29/16 with apical LAD disease, treated medically.Moderate RCA and D2 disease.  - 03/2018 cath distal LAD 50%, mid RCA 60%, LPDA 45%, D2 45%, mildly elevated LVEDP of 18   - no recent chest pain. No SOB or DOE.    2. Aflutter/Afib - CHADS2Vasc score of 2, on xarelto - s/p aflutter ablation 05/2016 by Dr Curt Bears.     - reported some palpitations in March/April 01/2019 event monitor: short episode of afib, SR with PACs - toprol was increased to 150mg  daily - no recent palpitations since medication change  3. Aortic aneurysm - 08/2018 CT ordered by pcp showed 4.3 ascending aortic aneurysm   4. HTN - compliant with meds - can have some orthostatic dizziness at times, primarily with bending and standing up  SH: works  [employer] & [employer]  The patient does not have symptoms concerning for COVID-19 infection (fever, chills, cough, or new shortness of breath).    Past Medical History:  Diagnosis Date   Arthritis    CAD (coronary artery disease)    a. LHC on 01/29/16 which revealed significant apical LAD stenosis, best treated medically. There was moderate disease in the D2 and mid nondominant RCA.[name] No PCI performed b. 03/2018: repeat cath showing regression of his LAD stenosis now being at 50% with 60% mid RCA stenosis, 45% ostial LPDA stenosis, and 45% ostial second diagonal stenosis.   Cataract    Diabetes mellitus    type 2   GERD (gastroesophageal reflux disease)    Hyperlipidemia    Hypertension    Obesity    PAF (paroxysmal atrial fibrillation) ([place]) 01/2016   Sleep apnea    has CPAP machine but not wear   Tubular adenoma of colon 08/2012   Past Surgical History:  Procedure Laterality Date   ANKLE SURGERY Left 2012   tendon repair   CARDIAC CATHETERIZATION [name] 01/29/2016   Procedure: Left Heart Cath and Coronary Angiography;  Surgeon: [name], MD;  Location: [facility];  Service: Cardiovascular;  Laterality: [name];   CATARACT EXTRACTION W/PHACO  11/11/2012   Procedure: CATARACT EXTRACTION PHACO AND INTRAOCULAR LENS PLACEMENT (IOC);  Surgeon: Tonny Maysun Meditz Kirkendoll, MD;  Location: [facility];  Service: Ophthalmology;  Laterality: Right;  CDE:  5.56   CATARACT EXTRACTION W/PHACO  Left 12/15/2013   Procedure: CATARACT EXTRACTION PHACO AND INTRAOCULAR LENS PLACEMENT (IOC);  Surgeon: Tonny Sahmya Arai, MD;  Location: [facility];  Service: Ophthalmology;  Laterality: Left;  CDE:13.36   CHOLECYSTECTOMY     CHONDROPLASTY Right 05/28/2017   Procedure: RIGHT KNEE CHONDROPLASTY;  Surgeon: [name], MD;  Location: [facility];  Service: Orthopedics;  Laterality: Right;   COLONOSCOPY     CYSTOSCOPY W/ URETERAL STENT PLACEMENT     right   ELECTROPHYSIOLOGIC STUDY [name] 06/02/2016    Procedure: A-Flutter Ablation;  Surgeon: [name], MD;  Location: [facility];  Service: Cardiovascular;  Laterality: [name];   KNEE ARTHROSCOPY WITH MEDIAL MENISECTOMY Right 05/28/2017   Procedure: KNEE ARTHROSCOPY WITH MEDIAL MENISECTOMY WITH EXTENSIVE SYNOVECTOMY;  Surgeon: [name], MD;  Location: [facility];  Service: Orthopedics;  Laterality: Right;   LEFT HEART CATH AND CORONARY ANGIOGRAPHY [name] 03/30/2018   Procedure: LEFT HEART CATH AND CORONARY ANGIOGRAPHY;  Surgeon: Leonie Man, MD;  Location: [facility];  Service: Cardiovascular;  Laterality: [name];   left knee sugery Left    Arthroscopy   NASAL SINUS SURGERY     POLYPECTOMY     TONSILLECTOMY       No outpatient medications have been marked as taking for the 05/04/19 encounter (Appointment) with Arnoldo Lenis, MD.     Allergies:   Bee venom and Codeine   Social History   Tobacco Use   Smoking status: Never Smoker   Smokeless tobacco: Never Used  Substance Use Topics   Alcohol use: Yes    Alcohol/week: 0.0 standard drinks    Comment: rare occasion   Drug use: No     Family Hx: The patient's family history includes Aneurysm in his maternal grandmother and mother; Cirrhosis in his father; Diabetes in his sister; Heart attack in his paternal grandmother and sister; Heart murmur in his mother; Hypertension in his mother; Stroke in his maternal grandmother and sister. There is no history of Colon cancer, Rectal cancer, or Stomach cancer.  ROS:   Please see the history of present illness.     All other systems reviewed and are negative.   Prior CV studies:   The following studies were reviewed today:  01/2016 echo Study Conclusions  - Left ventricle: The cavity size was mildly dilated. Wall  thickness was increased in a pattern of mild LVH. Systolic  function was normal. The estimated ejection fraction was in the  range of 55% to 60%.  01/2016  cath 1. Dist LAD lesion, 80% stenosed. 2. Mid RCA lesion, 60% stenosed. 3. Ost 2nd Diag to 2nd Diag lesion, 60% stenosed. 4. 2nd Mrg lesion, 30% stenosed. 5. Mid LAD lesion, 25% stenosed. 6. LPDA lesion, 45% stenosed.   Significant apical LAD stenosis, best treated with medication. Moderate disease in the second diagonal and mid nondominant RCA.  Normal left ventricular systolic function. Normal left ventricular filling pressures.  Post Cath Recommendations:   Medical therapy of both atrial fibrillation and coronary artery disease. Percutaneous intervention is not currently indicated for coronary disease.  03/2018 cath  Dist LAD lesion is 50% stenosed. -Demonstrates regression of disease  Mid RCA (nondominant) stable lesion is 60% stenosed. Stable  Ost LPDA to LPDA lesion is 45% stenosed. Stable  Ost 2nd Diag lesion is 45% stenosed. Demonstrates progression of disease  The left ventricular systolic function is normal. The left ventricular ejection fraction is 55-65% by visual estimate.  LV end  diastolic pressure is mildly elevated.  Angiographically stable to improved coronary disease with notably less significant disease in the diagonal Torey Regan as well as the apical LAD.  No potential culprit lesion to explain the patient's symptoms. Mildly elevated LVEDP.  Plan:  Discharge home after TR band removal and bedrest.  Restart Xarelto tonight.  01/2019 event monitor  14 day event monitor  Min HR 54, Max HR 120, Avg HR 76  Sinus rhythm with PACs, with episode of afib rate 120  Reported symptoms correlated with sinus rhythm and PACs   Labs/Other Tests and Data Reviewed:    EKG:  No ECG reviewed.  Recent Labs: No results found for requested labs within last 8760 hours.   Recent Lipid Panel Lab Results  Component Value Date/Time   CHOL 195 01/29/2016 01:09 AM   TRIG 133 01/29/2016 01:09 AM   HDL 39 (L) 01/29/2016 01:09 AM   CHOLHDL 5.0 01/29/2016 01:09  AM   LDLCALC 129 (H) 01/29/2016 01:09 AM    Wt Readings from Last 3 Encounters:  11/05/18 273 lb (123.8 kg)  04/16/18 283 lb (128.4 kg)  03/30/18 270 lb (122.5 kg)     Objective:    Vital Signs:   Today's Vitals   05/04/19 0843  BP: (!) 144/79  Pulse: 60  Weight: 268 lb (121.6 kg)  Height: 5\' 10"  (1.778 m)   Body mass index is 38.45 kg/m.  Well nourished male sitting comfortable in no distress. Normal affect. Normal speech pattern and tone. No visual or audible signs of SOB or wheezing.   ASSESSMENT & PLAN:    1. CAD - no recent chest pain or SO - his recent cath showed mild to moderate disease only - continue current meds. Stop ASA since on xarelto  2. Aflutter/Afib -recent palpitations improved with high toprol dose, continue current meds  3. HTN - elevated this AM, has not taken meds yet - he will check bp's in the evening and call us and update [place] on Friday - some orthstatic symptoms at times, may be somewhat lenient on bp goal   4. Aortic aneurysm - mild ascending aortic aneurysm, will need repeat CTA 08/2019  COVID-19 Education: The signs and symptoms of COVID-19 were discussed with the patient and how to seek care for testing (follow up with PCP or arrange E-visit).  The importance of social distancing was discussed today.  Time:   Today, I have spent 14 minutes with the patient with telehealth technology discussing the above problems.     Medication Adjustments/Labs and Tests Ordered: Current medicines are reviewed at length with the patient today.  Concerns regarding medicines are outlined above.   Tests Ordered: No orders of the defined types were placed in this encounter.   Medication Changes: No orders of the defined types were placed in this encounter.   Follow Up:  In Person in 6 month(s)  Signed, Carlyle Dolly, MD  05/04/2019 8:14 AM    [place]

## 2019-05-04 NOTE — Patient Instructions (Signed)
Your physician wants you to follow-up in: [place] will receive a reminder letter in the mail two months in advance. If you don't receive a letter, please call our office to schedule the follow-up appointment.  Your physician has recommended you make the following change in your medication:   STOP ASPIRIN   CHECK BLOOD PRESSURE AT HOME DAILY AND CALL us ON Friday WITH READINGS  Thank you for choosing [facility]!!

## 2019-05-09 ENCOUNTER — Telehealth: Payer: Self-pay | Admitting: *Deleted

## 2019-05-09 NOTE — Telephone Encounter (Signed)
Pt states he has a slight headache other than that he feel fine.   6/25  144/79  68          137/79  70 6/27   162/96 66           152/85 66 6/28   115/68 60  6/29   131/70 67           129/ 77 78

## 2019-05-10 NOTE — Telephone Encounter (Signed)
BP somewhat up and down, reasonable for now, particularly given he has had some prior issues with dizziness. No changes   Zandra Abts MD

## 2019-05-10 NOTE — Telephone Encounter (Signed)
Patient notified

## 2019-05-17 ENCOUNTER — Other Ambulatory Visit: Payer: Self-pay | Admitting: Cardiology

## 2019-06-23 ENCOUNTER — Telehealth: Payer: Self-pay | Admitting: Cardiology

## 2019-06-23 ENCOUNTER — Other Ambulatory Visit: Payer: Self-pay | Admitting: Cardiovascular Disease

## 2019-06-23 NOTE — Telephone Encounter (Signed)
Patient called back to the office stating that around 300pm he started having chest pains. Took 1 Nitrostat at 3pm. States that he wanted to notify doctor.

## 2019-06-23 NOTE — Telephone Encounter (Signed)
Reported chest pain rated 3/10 around 3:00 pm that felt like it was moving into his neck. Took nitroglycerin x's 1 and the pain resolved. Not having active chest pain. Advised that message would be sent to his provider and if his symptoms returned or got worse, to go to the ED for an evaluation. Verbalized understanding.

## 2019-06-23 NOTE — Telephone Encounter (Signed)
Patient called stating that around 130AM his heart started going into A Fib. Patient states that he a 30 mg Cardizem @130AM . States that he was having some pains in his chest with breathing difficulty at time of A Fib.    [phone].

## 2019-06-23 NOTE — Telephone Encounter (Signed)
Shirlean Mylar (wife) - No c/o active chest now.  Woke up in the middle of the night with AFib.  Upon waking - the heart rate was 105.  BP at that time was 250/100's.  Did take the 30mg  Diltiazem x 1 last evening.  After about 30 minutes - rechecked heart rate & was back down 80.  No c/o sob, chest pain now.  Does c/o nausea, weakness, & light headedness now.  Been under a lot of extreme stress at work.  Has been a long time since having one of those episodes, so not sure if the stress could be causing this or not.  Wife is also questioning about taking him out of work.

## 2019-06-24 ENCOUNTER — Encounter: Payer: Self-pay | Admitting: *Deleted

## 2019-06-24 NOTE — Telephone Encounter (Signed)
Received telephone call from patient's wife stating that she needs to speak with Dr. Harl Bowie in regards to her husband.

## 2019-06-24 NOTE — Telephone Encounter (Signed)
I spoke with patient, continue current meds for now. Can we give him a note for work excuse for yesterday and today. He will pick up in [place] office on Monday    Zandra Abts MD

## 2019-07-14 ENCOUNTER — Ambulatory Visit: Payer: 59 | Admitting: Neurology

## 2019-07-14 ENCOUNTER — Other Ambulatory Visit: Payer: Self-pay

## 2019-07-14 ENCOUNTER — Encounter

## 2019-07-14 ENCOUNTER — Encounter: Payer: Self-pay | Admitting: Neurology

## 2019-07-14 VITALS — BP 112/73 | HR 63 | Temp 97.3°F | Ht 70.0 in | Wt 266.0 lb

## 2019-07-14 DIAGNOSIS — R202 Paresthesia of skin: Secondary | ICD-10-CM | POA: Diagnosis not present

## 2019-07-14 NOTE — Progress Notes (Signed)
Reason for visit: Numbness of the hands  Referring physician: Dr. Gayla Medicus is a 63 y.o. male  History of present illness:  Mr. Kopecky is a 63 year old right-handed white male with a history of diabetes and atrial fibrillation.  The patient has chronic neck and low back pain, he is followed by Dr. Sherwood Gambler and Dr. Maryjean Ka.  The patient has had a longstanding history of numbness in the hands, right greater than left, he was seen many years ago by Dr. Erling Cruz and was told that he had carpal tunnel syndrome.  The patient never had nerve conduction studies however.  The patient apparently had an alteration in his job requirements, he was having to break down boxes on a regular basis and this resulted in a significant worsening of his right greater than left hand numbness and pain.  The patient has noted symptoms while driving and occasionally he will wake up at night with numbness.  He does have neck pain but no pain radiating down from the neck to the hand.  He reports no weakness of the extremities, he denies any big change in balance although he is slightly unsteady.  He does have occasional episodes of diarrhea but he is on metformin.  He denies issues controlling the bladder.  He does have some numbness in the feet.  He is sent to this office for an evaluation.  Past Medical History:  Diagnosis Date  . Arthritis   . CAD (coronary artery disease)    a. LHC on 01/29/16 which revealed significant apical LAD stenosis, best treated medically. There was moderate disease in the D2 and mid nondominant RCA.[name] No PCI performed b. 03/2018: repeat cath showing regression of his LAD stenosis now being at 50% with 60% mid RCA stenosis, 45% ostial LPDA stenosis, and 45% ostial second diagonal stenosis.  . Cataract   . Diabetes mellitus    type 2  . GERD (gastroesophageal reflux disease)   . Heart disease   . Hyperlipidemia   . Hypertension   . Obesity   . PAF (paroxysmal atrial fibrillation)  ([place]) 01/2016  . Sleep apnea    has CPAP machine but not wear  . Tubular adenoma of colon 08/2012    Past Surgical History:  Procedure Laterality Date  . ANKLE SURGERY Left 2012   tendon repair  . CARDIAC CATHETERIZATION [name] 01/29/2016   Procedure: Left Heart Cath and Coronary Angiography;  Surgeon: [name], MD;  Location: [facility];  Service: Cardiovascular;  Laterality: [name];  . CATARACT EXTRACTION W/PHACO  11/11/2012   Procedure: CATARACT EXTRACTION PHACO AND INTRAOCULAR LENS PLACEMENT (IOC);  Surgeon: [name], MD;  Location: [facility];  Service: Ophthalmology;  Laterality: Right;  CDE:  5.56  . CATARACT EXTRACTION W/PHACO Left 12/15/2013   Procedure: CATARACT EXTRACTION PHACO AND INTRAOCULAR LENS PLACEMENT (IOC);  Surgeon: [name], MD;  Location: [facility];  Service: Ophthalmology;  Laterality: Left;  CDE:13.36  . CHOLECYSTECTOMY    . CHONDROPLASTY Right 05/28/2017   Procedure: RIGHT KNEE CHONDROPLASTY;  Surgeon: [name], MD;  Location: [facility];  Service: Orthopedics;  Laterality: Right;  . COLONOSCOPY    . CYSTOSCOPY W/ URETERAL STENT PLACEMENT     right  . ELECTROPHYSIOLOGIC STUDY [name] 06/02/2016   Procedure: A-Flutter Ablation;  Surgeon: [name], MD;  Location: [facility];  Service: Cardiovascular;  Laterality: [name];  . KNEE ARTHROSCOPY WITH MEDIAL MENISECTOMY Right 05/28/2017   Procedure:  KNEE ARTHROSCOPY WITH MEDIAL MENISECTOMY WITH EXTENSIVE SYNOVECTOMY;  Surgeon: [name], MD;  Location: [facility];  Service: Orthopedics;  Laterality: Right;  . LEFT HEART CATH AND CORONARY ANGIOGRAPHY [name] 03/30/2018   Procedure: LEFT HEART CATH AND CORONARY ANGIOGRAPHY;  Surgeon: Leonie Man, MD;  Location: [facility];  Service: Cardiovascular;  Laterality: [name];  . left knee sugery Left    Arthroscopy  . NASAL SINUS SURGERY    . POLYPECTOMY    . TONSILLECTOMY      Family History  Problem Relation Age of  Onset  . Heart murmur Mother   . Aneurysm Mother   . Hypertension Mother   . Cirrhosis Father   . Heart attack Sister   . Diabetes Sister   . Aneurysm Maternal Grandmother   . Stroke Maternal Grandmother   . Heart attack Paternal Grandmother   . Stroke Sister   . Colon cancer Neg Hx   . Rectal cancer Neg Hx   . Stomach cancer Neg Hx     Social history:  reports that he has never smoked. He has never used smokeless tobacco. He reports current alcohol use. He reports that he does not use drugs.  Medications:  Prior to Admission medications   Medication Sig Start Date End Date Taking? Authorizing Provider  acetaminophen (TYLENOL) 325 MG tablet Take 650 mg by mouth every 6 (six) hours as needed for moderate pain or headache.    Yes [provider]  atorvastatin (LIPITOR) 80 MG tablet Take 1 tablet (80 mg total) by mouth daily at 6 PM. 03/04/19  Yes Branch, Alphonse Guild, MD  benazepril (LOTENSIN) 20 MG tablet Take 20 mg by mouth daily.   Yes [provider]  Canagliflozin-Metformin HCl (INVOKAMET) [phone] MG TABS Take 1 tablet by mouth 2 (two) times daily.   Yes [provider]  diltiazem (CARDIZEM) 30 MG tablet Take 1 tab 2 times daily as needed for palpitations Patient taking differently: Take 30 mg by mouth 2 (two) times daily as needed (for palpitations).  03/27/16  Yes BranchAlphonse Guild, MD  furosemide (LASIX) 20 MG tablet TAKE 1 TABLET (20 MG TOTAL) BY MOUTH DAILY AS NEEDED FOR FLUID. 05/17/19 08/15/19 Yes Branch, Alphonse Guild, MD  glimepiride (AMARYL) 4 MG tablet Take 4 mg by mouth 2 (two) times daily.  07/25/12  Yes [provider]  isosorbide mononitrate (IMDUR) 60 MG 24 hr tablet Take 1.5 tablets (90 mg total) by mouth daily. 05/04/19  Yes Branch, Alphonse Guild, MD  metoprolol succinate (TOPROL-XL) 100 MG 24 hr tablet TAKE 1 &1/2 TABLETS BY MOUTH DAILY WITH OR IMMEDIATELY FOLLOWING A MEAL 06/23/19  Yes Branch, Alphonse Guild, MD  nitroGLYCERIN (NITROSTAT) 0.4 MG  SL tablet Place 1 tablet (0.4 mg total) under the tongue every 5 (five) minutes as needed for chest pain. 03/16/18  Yes Branch, Alphonse Guild, MD  oxyCODONE-acetaminophen (ROXICET) 5-325 MG tablet Take 1-2 tablets by mouth every 4 (four) hours as needed. 05/28/17  Yes [name], PA-C  pantoprazole (PROTONIX) 40 MG tablet Take 40 mg by mouth daily.   Yes [provider]  rivaroxaban (XARELTO) 20 MG TABS tablet Take 1 tablet (20 mg total) by mouth daily with supper. 02/03/19  Yes Branch, Alphonse Guild, MD  Semaglutide (OZEMPIC, 1 MG/DOSE, [place]) Inject 0.5 mLs into the skin once a week.   Yes [provider]      Allergies  Allergen Reactions  . Bee Venom Swelling  .  Codeine Nausea Only    ROS:  Out of a complete 14 system review of symptoms, the patient complains only of the following symptoms, and all other reviewed systems are negative.  Fatigue Chest pain, swelling in the legs Hearing loss, ringing in the ears Impotence Runny nose Anxiety Snoring, shiftwork  Blood pressure 112/73, pulse 63, temperature (!) 97.3 F (36.3 C), temperature source Temporal, height 5\' 10"  (1.778 m), weight 266 lb (120.7 kg).  Physical Exam  General: The patient is alert and cooperative at the time of the examination.  The patient is moderately to markedly obese.  Eyes: Pupils are equal, round, and reactive to light. Discs are flat bilaterally.  Neck: The neck is supple, no carotid bruits are noted.  Respiratory: The respiratory examination is clear.  Cardiovascular: The cardiovascular examination reveals a regular rate and rhythm, no obvious murmurs or rubs are noted.  Skin: Extremities are with 1+ edema at the ankles bilaterally.  Neurologic Exam  Mental status: The patient is alert and oriented x 3 at the time of the examination. The patient has apparent normal recent and remote memory, with an apparently normal attention span and concentration ability.  Cranial nerves: Facial  symmetry is present. There is good sensation of the face to pinprick and soft touch bilaterally. The strength of the facial muscles and the muscles to head turning and shoulder shrug are normal bilaterally. Speech is well enunciated, no aphasia or dysarthria is noted. Extraocular movements are full. Visual fields are full. The tongue is midline, and the patient has symmetric elevation of the soft palate. No obvious hearing deficits are noted.  Motor: The motor testing reveals 5 over 5 strength of all 4 extremities. Good symmetric motor tone is noted throughout.  Sensory: Sensory testing is intact to pinprick, soft touch, vibration sensation, and position sense on all 4 extremities, with exception of some decrease in vibration sensation on the left foot as compared to the right. No evidence of extinction is noted.  Coordination: Cerebellar testing reveals good finger-nose-finger and heel-to-shin bilaterally.  Tinel sign on the right wrist is positive, negative on the left.  Gait and station: Gait is normal. Tandem gait is slightly unsteady. Romberg is negative. No drift is seen.  Reflexes: Deep tendon reflexes are diffusely decreased, the patient does have a slight reduction in the right knee jerk reflexes compared to the left, ankle jerk reflexes are depressed to absent bilaterally. Toes are downgoing bilaterally.   Assessment/Plan:  1.  Diabetes  2.  Probable carpal tunnel syndrome, right greater than left  3.  Probable diabetic neuropathy  The patient likely does have carpal tunnel syndrome on the right.  We will check nerve conduction studies on both arms and on the right leg.  We will do EMG study on the right arm.  He will follow-up for the above study.  He claims that the carpal tunnel syndrome symptoms have improved recently.  He does have a wrist splint that he wears at night, he may want to do this while working during the day as well.  Jill Alexanders MD 07/14/2019 8:29 AM  [facility] [address] [place],  [address]  Phone [phone] Fax [phone]

## 2019-08-16 ENCOUNTER — Ambulatory Visit (INDEPENDENT_AMBULATORY_CARE_PROVIDER_SITE_OTHER): Payer: 59 | Admitting: Neurology

## 2019-08-16 ENCOUNTER — Encounter: Payer: Self-pay | Admitting: Neurology

## 2019-08-16 ENCOUNTER — Other Ambulatory Visit: Payer: Self-pay

## 2019-08-16 ENCOUNTER — Ambulatory Visit: Payer: 59 | Admitting: Neurology

## 2019-08-16 DIAGNOSIS — R202 Paresthesia of skin: Secondary | ICD-10-CM

## 2019-08-16 DIAGNOSIS — G5620 Lesion of ulnar nerve, unspecified upper limb: Secondary | ICD-10-CM

## 2019-08-16 DIAGNOSIS — G5603 Carpal tunnel syndrome, bilateral upper limbs: Secondary | ICD-10-CM

## 2019-08-16 HISTORY — DX: Lesion of ulnar nerve, unspecified upper limb: G56.20

## 2019-08-16 HISTORY — DX: Carpal tunnel syndrome, bilateral upper limbs: G56.03

## 2019-08-16 NOTE — Progress Notes (Signed)
Please refer to EMG and nerve conduction procedure note.  

## 2019-08-16 NOTE — Procedures (Signed)
     HISTORY:  Lucas [name] is a 63 year old gentleman with a history of diabetes who reports discomfort in both upper extremities, mainly involving the right hand more than the left.  He reports some mild numbness in the feet as well.  He is being evaluated for possible neuropathy or a cervical radiculopathy.  NERVE CONDUCTION STUDIES:  Nerve conduction studies were performed on both upper extremities.  The distal motor latencies for the median nerves were prolonged with low motor amplitudes for these nerves bilaterally.  Slowing was seen for the right median nerve, with a normal nerve conduction velocity seen for the left median nerve.  The distal motor latencies for the ulnar nerves were normal bilaterally with a slightly low motor amplitude on the left, normal on the right.  Slowing was seen across the elbows on both ulnar nerves, normal below the elbows.  The sensory latencies for the radial nerves and for the ulnar nerves were normal bilaterally.  The right median sensory latency was unobtainable, prolonged on the left.  The F-wave latencies for the ulnar nerves were prolonged bilaterally.  Nerve conduction studies were performed on the right lower extremity.  The distal motor latencies and motor amplitudes for the peroneal and posterior tibial nerves were normal with slowing seen for the right posterior tibial nerve, normal for the right peroneal nerve.  The right peroneal sensory latency was prolonged, normal for the right sural nerve.  The right posterior tibial F wave latency was prolonged.  EMG STUDIES:  EMG study was performed on the right upper extremity:  The first dorsal interosseous muscle reveals 2 to 4 K units with slightly reduced recruitment. No fibrillations or positive waves were noted. The abductor pollicis brevis muscle reveals 2 to 4 K units with slightly reduced recruitment. No fibrillations or positive waves were noted. The extensor indicis proprius muscle reveals 1 to 3  K units with full recruitment. No fibrillations or positive waves were noted. The pronator teres muscle reveals 2 to 3 K units with full recruitment. No fibrillations or positive waves were noted. The biceps muscle reveals 1 to 2 K units with full recruitment. No fibrillations or positive waves were noted. The triceps muscle reveals 2 to 4 K units with full recruitment. No fibrillations or positive waves were noted. The anterior deltoid muscle reveals 2 to 3 K units with full recruitment. No fibrillations or positive waves were noted. The cervical paraspinal muscles were tested at 2 levels. No abnormalities of insertional activity were seen at either level tested. There was good relaxation.   IMPRESSION:  Nerve conduction studies done on both upper extremities and the right lower extremity shows evidence of a mild generalized diabetic peripheral neuropathy.  There appears to be evidence of bilateral carpal tunnel syndrome of moderate severity and evidence of mild bilateral ulnar neuropathies across the elbows.  EMG evaluation of the right upper extremity shows mild chronic stable neuropathic denervation consistent with the ulnar neuropathy and median neuropathy, with no evidence of an overlying cervical radiculopathy.  Jill Alexanders MD 08/16/2019 11:09 AM  [facility] [address] [place], [place] [address]  Phone [phone] Fax [phone]

## 2019-08-16 NOTE — Progress Notes (Addendum)
The patient comes in for EMG and nerve conduction study evaluation.  He is found to have bilateral carpal tunnel syndrome and mild bilateral ulnar neuropathies, he likely has a mild overlying diabetic neuropathy.  He will be sent to a hand surgeon for the carpal tunnel syndrome that bothers him worse on the right arm than the left.       [place]    Nerve / Sites Muscle Latency Ref. Amplitude Ref. Rel Amp Segments Distance Velocity Ref. Area    ms ms mV mV %  cm m/s m/s mVms  L Median - APB     Wrist APB 6.6 ?4.4 3.8 ?4.0 100 Wrist - APB 7   16.8     Upper arm APB 10.9  3.6  94.1 Upper arm - Wrist 21 49 ?49 16.2  R Median - APB     Wrist APB 7.6 ?4.4 3.4 ?4.0 100 Wrist - APB 7   16.7     Upper arm APB 12.5  3.4  100 Upper arm - Wrist 21 42 ?49 16.9  L Ulnar - ADM     Wrist ADM 2.5 ?3.3 5.9 ?6.0 100 Wrist - ADM 7   26.0     B.Elbow ADM 6.5  5.3  89.9 B.Elbow - Wrist 20 50 ?49 24.8     A.Elbow ADM 8.8  5.1  96.5 A.Elbow - B.Elbow 10 44 ?49 25.3         A.Elbow - Wrist      R Ulnar - ADM     Wrist ADM 3.1 ?3.3 6.0 ?6.0 100 Wrist - ADM 7   29.7     B.Elbow ADM 7.0  4.9  80.9 B.Elbow - Wrist 20 51 ?49 21.9     A.Elbow ADM 9.7  4.4  90.1 A.Elbow - B.Elbow 10 37 ?49 22.2         A.Elbow - Wrist      R Peroneal - EDB     Ankle EDB 5.3 ?6.5 4.9 ?2.0 100 Ankle - EDB 9   14.8     Fib head EDB 12.0  4.8  98 Fib head - Ankle 30 45 ?44 15.4     Pop fossa EDB 14.2  4.6  96.3 Pop fossa - Fib head 10 45 ?44 15.0         Pop fossa - Ankle      R Tibial - AH     Ankle AH 4.0 ?5.8 9.3 ?4.0 100 Ankle - AH 9   19.7     Pop fossa AH 13.7  5.0  53.3 Pop fossa - Ankle 37 38 ?41 19.1                  SNC    Nerve / Sites Rec. Site Peak Lat Ref.  Amp Ref. Segments Distance    ms ms V V  cm  L Radial - Anatomical snuff box (Forearm)     Forearm Wrist 2.4 ?2.9 23 ?15 Forearm - Wrist 10  R Radial - Anatomical snuff box (Forearm)     Forearm Wrist 2.4 ?2.9 15 ?15 Forearm - Wrist 10  R Sural - Ankle (Calf)      Calf Ankle 3.8 ?4.4 10 ?6 Calf - Ankle 14  R Superficial peroneal - Ankle     Lat leg Ankle 4.8 ?4.4 4 ?6 Lat leg - Ankle 14  L Median - Orthodromic (Dig II, Mid palm)     Dig II Wrist  4.0 ?3.4 2 ?10 Dig II - Wrist 13  R Median - Orthodromic (Dig II, Mid palm)     Dig II Wrist NR ?3.4 NR ?10 Dig II - Wrist 13  L Ulnar - Orthodromic, (Dig V, Mid palm)     Dig V Wrist 2.8 ?3.1 3 ?5 Dig V - Wrist 11  R Ulnar - Orthodromic, (Dig V, Mid palm)     Dig V Wrist 2.9 ?3.1 1 ?5 Dig V - Wrist 43                     F  Wave    Nerve F Lat Ref.   ms ms  L Ulnar - ADM 34.4 ?32.0  R Ulnar - ADM 34.7 ?32.0  R Tibial - AH 64.1 ?56.0

## 2019-08-23 ENCOUNTER — Other Ambulatory Visit: Payer: Self-pay | Admitting: Orthopedic Surgery

## 2019-08-30 ENCOUNTER — Encounter (HOSPITAL_BASED_OUTPATIENT_CLINIC_OR_DEPARTMENT_OTHER): Payer: Self-pay | Admitting: *Deleted

## 2019-08-30 ENCOUNTER — Other Ambulatory Visit: Payer: Self-pay

## 2019-09-01 NOTE — Consult Note (Signed)
Patient is scheduled for Hand Surgery 09/06/2019 but have not been able to get a Cardiac Clearance & Clearance to D/C Xeralto prior to surgery.  Waiting for that or will have to [place].  Dr Harl Bowie to advise.

## 2019-09-02 ENCOUNTER — Other Ambulatory Visit (HOSPITAL_COMMUNITY)
Admission: RE | Admit: 2019-09-02 | Discharge: 2019-09-02 | Disposition: A | Discharge status: Home / Self Care | Payer: 59 | Source: Ambulatory Visit | Attending: Orthopedic Surgery | Admitting: Orthopedic Surgery

## 2019-09-02 ENCOUNTER — Encounter (HOSPITAL_BASED_OUTPATIENT_CLINIC_OR_DEPARTMENT_OTHER)
Admission: RE | Admit: 2019-09-02 | Discharge: 2019-09-02 | Disposition: A | Discharge status: Home / Self Care | Payer: 59 | Source: Ambulatory Visit | Attending: Orthopedic Surgery | Admitting: Orthopedic Surgery

## 2019-09-02 ENCOUNTER — Other Ambulatory Visit: Payer: Self-pay

## 2019-09-02 DIAGNOSIS — G4730 Sleep apnea, unspecified: Secondary | ICD-10-CM | POA: Diagnosis not present

## 2019-09-02 DIAGNOSIS — E785 Hyperlipidemia, unspecified: Secondary | ICD-10-CM | POA: Diagnosis not present

## 2019-09-02 DIAGNOSIS — I480 Paroxysmal atrial fibrillation: Secondary | ICD-10-CM | POA: Diagnosis not present

## 2019-09-02 DIAGNOSIS — Z20828 Contact with and (suspected) exposure to other viral communicable diseases: Secondary | ICD-10-CM | POA: Insufficient documentation

## 2019-09-02 DIAGNOSIS — Z7984 Long term (current) use of oral hypoglycemic drugs: Secondary | ICD-10-CM | POA: Diagnosis not present

## 2019-09-02 DIAGNOSIS — Z01812 Encounter for preprocedural laboratory examination: Secondary | ICD-10-CM | POA: Insufficient documentation

## 2019-09-02 DIAGNOSIS — Z79899 Other long term (current) drug therapy: Secondary | ICD-10-CM | POA: Diagnosis not present

## 2019-09-02 DIAGNOSIS — I2510 Atherosclerotic heart disease of native coronary artery without angina pectoris: Secondary | ICD-10-CM | POA: Diagnosis not present

## 2019-09-02 DIAGNOSIS — I10 Essential (primary) hypertension: Secondary | ICD-10-CM | POA: Diagnosis not present

## 2019-09-02 DIAGNOSIS — G5603 Carpal tunnel syndrome, bilateral upper limbs: Secondary | ICD-10-CM | POA: Diagnosis present

## 2019-09-02 DIAGNOSIS — M1990 Unspecified osteoarthritis, unspecified site: Secondary | ICD-10-CM | POA: Diagnosis not present

## 2019-09-02 DIAGNOSIS — Z8249 Family history of ischemic heart disease and other diseases of the circulatory system: Secondary | ICD-10-CM | POA: Diagnosis not present

## 2019-09-02 DIAGNOSIS — Z833 Family history of diabetes mellitus: Secondary | ICD-10-CM | POA: Diagnosis not present

## 2019-09-02 DIAGNOSIS — Z6837 Body mass index (BMI) 37.0-37.9, adult: Secondary | ICD-10-CM | POA: Diagnosis not present

## 2019-09-02 DIAGNOSIS — K219 Gastro-esophageal reflux disease without esophagitis: Secondary | ICD-10-CM | POA: Diagnosis not present

## 2019-09-02 DIAGNOSIS — E669 Obesity, unspecified: Secondary | ICD-10-CM | POA: Diagnosis not present

## 2019-09-02 DIAGNOSIS — Z7901 Long term (current) use of anticoagulants: Secondary | ICD-10-CM | POA: Diagnosis not present

## 2019-09-02 DIAGNOSIS — Z885 Allergy status to narcotic agent status: Secondary | ICD-10-CM | POA: Diagnosis not present

## 2019-09-02 DIAGNOSIS — E119 Type 2 diabetes mellitus without complications: Secondary | ICD-10-CM | POA: Diagnosis not present

## 2019-09-02 LAB — BASIC METABOLIC PANEL
Anion gap: 9 (ref 5–15)
BUN: 9 mg/dL (ref 8–23)
CO2: 25 mmol/L (ref 22–32)
Calcium: 9 mg/dL (ref 8.9–10.3)
Chloride: 105 mmol/L (ref 98–111)
Creatinine, Ser: 0.89 mg/dL (ref 0.61–1.24)
GFR calc Af Amer: >60 mL/min (ref >60)
GFR calc non Af Amer: >60 mL/min (ref >60)
Glucose, Bld: 159 mg/dL — ABNORMAL HIGH (ref 70–99)
Potassium: 4.2 mmol/L (ref 3.5–5.1)
Sodium: 139 mmol/L (ref 135–145)

## 2019-09-02 NOTE — Progress Notes (Signed)

## 2019-09-03 LAB — NOVEL CORONAVIRUS, NAA (HOSP ORDER, SEND-OUT TO REF LAB; TAT 18-24 HRS): SARS-CoV-2, NAA: NOT DETECTED

## 2019-09-06 ENCOUNTER — Other Ambulatory Visit: Payer: Self-pay

## 2019-09-06 ENCOUNTER — Ambulatory Visit (HOSPITAL_BASED_OUTPATIENT_CLINIC_OR_DEPARTMENT_OTHER)
Admission: RE | Admit: 2019-09-06 | Discharge: 2019-09-06 | Disposition: A | Discharge status: Home / Self Care | Payer: 59 | Attending: Orthopedic Surgery | Admitting: Orthopedic Surgery

## 2019-09-06 ENCOUNTER — Encounter (HOSPITAL_BASED_OUTPATIENT_CLINIC_OR_DEPARTMENT_OTHER)
Admission: RE | Disposition: A | Discharge status: Home / Self Care | Payer: Self-pay | Source: Home / Self Care | Attending: Orthopedic Surgery

## 2019-09-06 ENCOUNTER — Encounter (HOSPITAL_BASED_OUTPATIENT_CLINIC_OR_DEPARTMENT_OTHER): Payer: Self-pay | Admitting: *Deleted

## 2019-09-06 ENCOUNTER — Ambulatory Visit (HOSPITAL_BASED_OUTPATIENT_CLINIC_OR_DEPARTMENT_OTHER): Payer: 59 | Admitting: Certified Registered"

## 2019-09-06 DIAGNOSIS — Z6837 Body mass index (BMI) 37.0-37.9, adult: Secondary | ICD-10-CM | POA: Insufficient documentation

## 2019-09-06 DIAGNOSIS — I10 Essential (primary) hypertension: Secondary | ICD-10-CM | POA: Insufficient documentation

## 2019-09-06 DIAGNOSIS — Z79899 Other long term (current) drug therapy: Secondary | ICD-10-CM | POA: Insufficient documentation

## 2019-09-06 DIAGNOSIS — Z885 Allergy status to narcotic agent status: Secondary | ICD-10-CM | POA: Insufficient documentation

## 2019-09-06 DIAGNOSIS — K219 Gastro-esophageal reflux disease without esophagitis: Secondary | ICD-10-CM | POA: Insufficient documentation

## 2019-09-06 DIAGNOSIS — I2510 Atherosclerotic heart disease of native coronary artery without angina pectoris: Secondary | ICD-10-CM | POA: Insufficient documentation

## 2019-09-06 DIAGNOSIS — I480 Paroxysmal atrial fibrillation: Secondary | ICD-10-CM | POA: Insufficient documentation

## 2019-09-06 DIAGNOSIS — Z833 Family history of diabetes mellitus: Secondary | ICD-10-CM | POA: Insufficient documentation

## 2019-09-06 DIAGNOSIS — E669 Obesity, unspecified: Secondary | ICD-10-CM | POA: Insufficient documentation

## 2019-09-06 DIAGNOSIS — M1990 Unspecified osteoarthritis, unspecified site: Secondary | ICD-10-CM | POA: Insufficient documentation

## 2019-09-06 DIAGNOSIS — G5603 Carpal tunnel syndrome, bilateral upper limbs: Secondary | ICD-10-CM | POA: Diagnosis not present

## 2019-09-06 DIAGNOSIS — G4730 Sleep apnea, unspecified: Secondary | ICD-10-CM | POA: Insufficient documentation

## 2019-09-06 DIAGNOSIS — Z7984 Long term (current) use of oral hypoglycemic drugs: Secondary | ICD-10-CM | POA: Insufficient documentation

## 2019-09-06 DIAGNOSIS — E785 Hyperlipidemia, unspecified: Secondary | ICD-10-CM | POA: Insufficient documentation

## 2019-09-06 DIAGNOSIS — E119 Type 2 diabetes mellitus without complications: Secondary | ICD-10-CM | POA: Insufficient documentation

## 2019-09-06 DIAGNOSIS — Z7901 Long term (current) use of anticoagulants: Secondary | ICD-10-CM | POA: Insufficient documentation

## 2019-09-06 DIAGNOSIS — Z8249 Family history of ischemic heart disease and other diseases of the circulatory system: Secondary | ICD-10-CM | POA: Insufficient documentation

## 2019-09-06 HISTORY — PX: CARPAL TUNNEL RELEASE: SHX101

## 2019-09-06 LAB — GLUCOSE, CAPILLARY
Glucose-Capillary: 115 mg/dL — ABNORMAL HIGH (ref 70–99)
Glucose-Capillary: 128 mg/dL — ABNORMAL HIGH (ref 70–99)
Glucose-Capillary: 119 mg/dL — ABNORMAL HIGH (ref 70–99)

## 2019-09-06 SURGERY — CARPAL TUNNEL RELEASE
Anesthesia: Monitor Anesthesia Care | Site: Wrist | Laterality: Right

## 2019-09-06 MED ORDER — CEFAZOLIN SODIUM-DEXTROSE 2-4 GM/100ML-% IV SOLN
INTRAVENOUS | Status: AC
  Filled 2019-09-06: qty 100

## 2019-09-06 MED ORDER — LACTATED RINGERS IV SOLN
INTRAVENOUS | Status: DC
  Administered 2019-09-06: 08:00:00 via INTRAVENOUS

## 2019-09-06 MED ORDER — FENTANYL CITRATE (PF) 100 MCG/2ML IJ SOLN
INTRAMUSCULAR | Status: AC
  Filled 2019-09-06: qty 2

## 2019-09-06 MED ORDER — MIDAZOLAM HCL 2 MG/2ML IJ SOLN
1.0000 mg | INTRAMUSCULAR | Status: DC | PRN
  Administered 2019-09-06: 1 mg via INTRAVENOUS

## 2019-09-06 MED ORDER — ONDANSETRON HCL 4 MG/2ML IJ SOLN
INTRAMUSCULAR | Status: DC | PRN
  Administered 2019-09-06: 4 mg via INTRAVENOUS

## 2019-09-06 MED ORDER — HYDROCODONE-ACETAMINOPHEN 5-325 MG PO TABS: 1.0000 | ORAL_TABLET | Freq: Four times a day (QID) | ORAL | 0 refills | Status: DC | PRN

## 2019-09-06 MED ORDER — FENTANYL CITRATE (PF) 100 MCG/2ML IJ SOLN: 25.0000 ug | INTRAMUSCULAR | Status: DC | PRN

## 2019-09-06 MED ORDER — BUPIVACAINE HCL (PF) 0.25 % IJ SOLN
INTRAMUSCULAR | Status: DC | PRN
  Administered 2019-09-06: 10 mL

## 2019-09-06 MED ORDER — LIDOCAINE HCL (PF) 0.5 % IJ SOLN
INTRAMUSCULAR | Status: DC | PRN
  Administered 2019-09-06: 30 mL via INTRAVENOUS

## 2019-09-06 MED ORDER — ONDANSETRON HCL 4 MG/2ML IJ SOLN
INTRAMUSCULAR | Status: AC
  Filled 2019-09-06: qty 2

## 2019-09-06 MED ORDER — CEFAZOLIN SODIUM-DEXTROSE 2-4 GM/100ML-% IV SOLN
2.0000 g | INTRAVENOUS | Status: AC
  Administered 2019-09-06: 2 g via INTRAVENOUS

## 2019-09-06 MED ORDER — FENTANYL CITRATE (PF) 100 MCG/2ML IJ SOLN
50.0000 ug | INTRAMUSCULAR | Status: DC | PRN
  Administered 2019-09-06: 50 ug via INTRAVENOUS

## 2019-09-06 MED ORDER — MIDAZOLAM HCL 2 MG/2ML IJ SOLN
INTRAMUSCULAR | Status: AC
  Filled 2019-09-06: qty 2

## 2019-09-06 MED ORDER — CHLORHEXIDINE GLUCONATE 4 % EX LIQD: 60.0000 mL | Freq: Once | CUTANEOUS | Status: DC

## 2019-09-06 MED ORDER — PROPOFOL 500 MG/50ML IV EMUL
INTRAVENOUS | Status: DC | PRN
  Administered 2019-09-06: 50 ug/kg/min via INTRAVENOUS

## 2019-09-06 SURGICAL SUPPLY — 39 items
APL PRP STRL LF DISP 70% ISPRP (MISCELLANEOUS) ×1
BLADE SURG 15 STRL LF DISP TIS (BLADE) ×1 IMPLANT
BLADE SURG 15 STRL SS (BLADE) ×3
BNDG CMPR 9X4 STRL LF SNTH (GAUZE/BANDAGES/DRESSINGS)
BNDG COHESIVE 3X5 TAN STRL LF (GAUZE/BANDAGES/DRESSINGS) ×3 IMPLANT
BNDG ESMARK 4X9 LF (GAUZE/BANDAGES/DRESSINGS) IMPLANT
BNDG GAUZE ELAST 4 BULKY (GAUZE/BANDAGES/DRESSINGS) ×3 IMPLANT
CHLORAPREP W/TINT 26 (MISCELLANEOUS) ×3 IMPLANT
CORD BIPOLAR FORCEPS 12FT (ELECTRODE) ×3 IMPLANT
COVER BACK TABLE REUSABLE LG (DRAPES) ×3 IMPLANT
COVER MAYO STAND REUSABLE (DRAPES) ×3 IMPLANT
COVER WAND RF STERILE (DRAPES) IMPLANT
CUFF TOURN SGL QUICK 18X4 (TOURNIQUET CUFF) ×3 IMPLANT
DRAPE EXTREMITY T 121X128X90 (DISPOSABLE) ×3 IMPLANT
DRAPE SURG 17X23 STRL (DRAPES) ×3 IMPLANT
DRSG PAD ABDOMINAL 8X10 ST (GAUZE/BANDAGES/DRESSINGS) ×3 IMPLANT
GAUZE SPONGE 4X4 12PLY STRL (GAUZE/BANDAGES/DRESSINGS) ×3 IMPLANT
GAUZE XEROFORM 1X8 LF (GAUZE/BANDAGES/DRESSINGS) ×3 IMPLANT
GLOVE BIO SURGEON STRL SZ 6.5 (GLOVE) ×2 IMPLANT
GLOVE BIO SURGEONS STRL SZ 6.5 (GLOVE) ×2
GLOVE BIOGEL PI IND STRL 7.0 (GLOVE) IMPLANT
GLOVE BIOGEL PI IND STRL 8.5 (GLOVE) ×1 IMPLANT
GLOVE BIOGEL PI INDICATOR 7.0 (GLOVE) ×2
GLOVE BIOGEL PI INDICATOR 8.5 (GLOVE) ×2
GLOVE SURG ORTHO 8.0 STRL STRW (GLOVE) ×3 IMPLANT
GOWN STRL REUS W/ TWL LRG LVL3 (GOWN DISPOSABLE) ×1 IMPLANT
GOWN STRL REUS W/TWL LRG LVL3 (GOWN DISPOSABLE) ×3
GOWN STRL REUS W/TWL XL LVL3 (GOWN DISPOSABLE) ×3 IMPLANT
NDL PRECISIONGLIDE 27X1.5 (NEEDLE) IMPLANT
NEEDLE PRECISIONGLIDE 27X1.5 (NEEDLE) ×3 IMPLANT
NS IRRIG 1000ML POUR BTL (IV SOLUTION) ×3 IMPLANT
PACK BASIN DAY SURGERY FS (CUSTOM PROCEDURE TRAY) ×3 IMPLANT
STOCKINETTE 4X48 STRL (DRAPES) ×3 IMPLANT
SUT ETHILON 4 0 PS 2 18 (SUTURE) ×3 IMPLANT
SUT VICRYL 4-0 PS2 18IN ABS (SUTURE) ×2 IMPLANT
SYR BULB 3OZ (MISCELLANEOUS) ×3 IMPLANT
SYR CONTROL 10ML LL (SYRINGE) ×2 IMPLANT
TOWEL GREEN STERILE FF (TOWEL DISPOSABLE) ×3 IMPLANT
UNDERPAD 30X36 HEAVY ABSORB (UNDERPADS AND DIAPERS) ×3 IMPLANT

## 2019-09-06 NOTE — Anesthesia Preprocedure Evaluation (Signed)
Anesthesia Evaluation  Patient identified by MRN, date of birth, ID band Patient awake    Reviewed: Allergy & Precautions, NPO status , Patient's Chart, lab work & pertinent test results  Airway Mallampati: II  TM Distance: >3 FB     Dental   Pulmonary neg pulmonary ROS,    breath sounds clear to auscultation       Cardiovascular hypertension, + angina + CAD   Rhythm:Regular Rate:Normal     Neuro/Psych    GI/Hepatic Neg liver ROS, GERD  ,  Endo/Other  diabetes  Renal/GU Renal disease     Musculoskeletal   Abdominal   Peds  Hematology   Anesthesia Other Findings   Reproductive/Obstetrics                             Anesthesia Physical Anesthesia Plan  ASA: III  Anesthesia Plan: MAC   Post-op Pain Management:    Induction: Intravenous  PONV Risk Score and Plan: 1 and Ondansetron, Dexamethasone and Midazolam  Airway Management Planned: Nasal Cannula and Simple Face Mask  Additional Equipment:   Intra-op Plan:   Post-operative Plan:   Informed Consent: I have reviewed the patients History and Physical, chart, labs and discussed the procedure including the risks, benefits and alternatives for the proposed anesthesia with the patient or authorized representative who has indicated his/her understanding and acceptance.     Dental advisory given  Plan Discussed with: CRNA and Anesthesiologist  Anesthesia Plan Comments:         Anesthesia Quick Evaluation
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
## 2019-09-06 NOTE — Discharge Instructions (Addendum)

## 2019-09-06 NOTE — Op Note (Signed)
NAME: Lucas [name] [facility] MEDICAL RECORD NO: [id number] DATE OF BIRTH: 06-14-1956 FAC[facility] LOCATION: [facility] PHYSICIAN: Wynonia Sours, MD   OPERATIVE REPORT   DATE OF PROCEDURE: 09/06/19    PREOPERATIVE DIAGNOSIS:   Carpal tunnel syndrome right hand   POSTOPERATIVE DIAGNOSIS:   Same   PROCEDURE:   Decompression median nerve right hand   SURGEON: Daryll Brod, M.D.   ASSISTANT: none   ANESTHESIA:  Bier block with sedation and Local   INTRAVENOUS FLUIDS:  Per anesthesia flow sheet.   ESTIMATED BLOOD LOSS:  Minimal.   COMPLICATIONS:  None.   SPECIMENS:  none   TOURNIQUET TIME:    Total Tourniquet Time Documented: Forearm (Right) - 21 minutes Total: Forearm (Right) - 21 minutes    DISPOSITION:  Stable to PACU.   INDICATIONS: Patient is a 63 year old male with history of numbness and tingling to his hands.  He has had nerve conductions done revealing significant carpal tunnel syndrome bilaterally.  He has elected to undergo surgical decompression of the median nerve right side.  Preperi-and postoperative course been discussed along with risks and complications.  He is aware there is no guarantee to the surgery the possibility of infection recurrence injury to arteries nerves tendons complete relief of symptoms and dystrophy.  Preoperative area the patient is seen the extremity marked by both patient and surgeon antibiotic given  OPERATIVE COURSE: Patient is brought to the operating room where form based IV regional anesthetic was carried out without difficulty.  He was prepped using ChloraPrep and in supine position with the right arm free.  A 3-minute dry time was allowed and a timeout taken to confirm patient procedure.  A longitudinal incision was made in the right palm carried down through subcutaneous tissue.  Bleeders were electrocauterized with bipolar.  The palmar fascia was split.  Superficial palmar tries identified.  Flexor tendon to the ring little  finger was identified.  To the ulnar side of the median nerve the flexor retinaculum was incised with sharp dissection.  A right angle and stool retractor placed between skin and forearm fascia.  Deep structures were dissected free with blunt dissection.  Blunt scissors were then used to release the proximal aspect of the flexor retinaculum distal forearm fascia for approximately 2 cm proximal to the wrist crease under direct vision.  The canal was explored.  The area of compression to the median nerve was apparent.  Motor branch entered the muscle distally.  The wound was copiously irrigated with saline.  The skin was closed interrupted 4-0 nylon sutures.  A local infiltration quarter percent bupivacaine without epinephrine was given approximately 10 cc was used.  A sterile compressive dressing was applied.  The deflation of the tourniquet all fingers immediately pink.  He was taken to the recovery room for observation in satisfactory condition.  He will be discharged home to return [place] in 1 week on Tylenol ibuprofen with Norco for a backup.   Daryll Brod, MD Electronically signed, 09/06/19

## 2019-09-06 NOTE — Transfer of Care (Signed)
Immediate Anesthesia Transfer of Care Note  Patient: Bawi Lakins [place]  Procedure(s) Performed: CARPAL TUNNEL RELEASE (Right Wrist)  Patient Location: PACU  Anesthesia Type:MAC and Bier block  Level of Consciousness: awake, alert  and oriented  Airway & Oxygen Therapy: Patient Spontanous Breathing and Patient connected to nasal cannula oxygen  Post-op Assessment: Report given to RN and Post -op Vital signs reviewed and stable  Post vital signs: Reviewed and stable  Last Vitals:  Vitals Value Taken Time  BP 119/70 09/06/19 1007  Temp    Pulse 64 09/06/19 1008  Resp 18 09/06/19 1008  SpO2 100 % 09/06/19 1008  Vitals shown include unvalidated device data.  Last Pain:  Vitals:   09/06/19 0805  TempSrc: Oral  PainSc: 5       Patients Stated Pain Goal: 2 (05/39/76 7341)  Complications: No apparent anesthesia complications

## 2019-09-06 NOTE — Anesthesia Postprocedure Evaluation (Signed)
Anesthesia Post Note  Patient: Lucas [name]  Procedure(s) Performed: CARPAL TUNNEL RELEASE (Right Wrist)     Patient location during evaluation: PACU Anesthesia Type: MAC Level of consciousness: awake Pain management: pain level controlled Vital Signs Assessment: post-procedure vital signs reviewed and stable Respiratory status: spontaneous breathing Cardiovascular status: stable Postop Assessment: no apparent nausea or vomiting Anesthetic complications: no    Last Vitals:  Vitals:   09/06/19 1030 09/06/19 1045  BP: 126/76 125/72  Pulse: 61 61  Resp: 17 16  Temp:  36.7 C  SpO2: 94% 94%    Last Pain:  Vitals:   09/06/19 1045  TempSrc:   PainSc: 0-No pain                 Vivien Barretto

## 2019-09-06 NOTE — Anesthesia Procedure Notes (Signed)
Anesthesia Regional Block: Bier block (IV Regional)   Pre-Anesthetic Checklist: ,, timeout performed, Correct Patient, Correct Site, Correct Laterality, Correct Procedure,, site marked, surgical consent,, at surgeon's request  Laterality: Right     Needles:  Injection technique: Single-shot  Needle Type: Other      Needle Gauge: 20     Additional Needles:   Procedures:,,,,, intact distal pulses, Esmarch exsanguination, single tourniquet utilized,  Narrative:  Start time: 09/06/2019 9:40 AM End time: 09/06/2019 9:40 AM  Performed by: Personally

## 2019-09-06 NOTE — Brief Op Note (Signed)
09/06/2019  10:14 AM  PATIENT:  [name] Sill  63 y.o. male  PRE-OPERATIVE DIAGNOSIS:  RIGHT CARPAL TUNNEL SYNDROME  POST-OPERATIVE DIAGNOSIS:  RIGHT CARPAL TUNNEL SYNDROME  PROCEDURE:  Procedure(s): CARPAL TUNNEL RELEASE (Right)  SURGEON:  Surgeon(s) and Role:    * Daryll Brod, MD - Primary  PHYSICIAN ASSISTANT:   ASSISTANTS: none   ANESTHESIA:   local, regional and IV sedation  EBL: 18ml  BLOOD ADMINISTERED:none  DRAINS: none   LOCAL MEDICATIONS USED:  BUPIVICAINE   SPECIMEN:  No Specimen  DISPOSITION OF SPECIMEN:  [name]  COUNTS:  YES  TOURNIQUET:   Total Tourniquet Time Documented: Forearm (Right) - 21 minutes Total: Forearm (Right) - 21 minutes   DICTATION: .Dragon Dictation  PLAN OF CARE: Discharge to home after PACU  PATIENT DISPOSITION:  PACU - hemodynamically stable.

## 2019-09-07 ENCOUNTER — Encounter (HOSPITAL_BASED_OUTPATIENT_CLINIC_OR_DEPARTMENT_OTHER): Payer: Self-pay | Admitting: Orthopedic Surgery

## 2019-09-21 ENCOUNTER — Other Ambulatory Visit: Payer: Self-pay | Admitting: Orthopedic Surgery

## 2019-09-28 ENCOUNTER — Other Ambulatory Visit: Payer: Self-pay

## 2019-09-28 ENCOUNTER — Encounter (HOSPITAL_BASED_OUTPATIENT_CLINIC_OR_DEPARTMENT_OTHER): Payer: Self-pay

## 2019-10-03 ENCOUNTER — Other Ambulatory Visit: Payer: Self-pay

## 2019-10-03 ENCOUNTER — Encounter (HOSPITAL_BASED_OUTPATIENT_CLINIC_OR_DEPARTMENT_OTHER)
Admission: RE | Admit: 2019-10-03 | Discharge: 2019-10-03 | Disposition: A | Discharge status: Home / Self Care | Payer: 59 | Source: Ambulatory Visit | Attending: Orthopedic Surgery | Admitting: Orthopedic Surgery

## 2019-10-03 DIAGNOSIS — Z01812 Encounter for preprocedural laboratory examination: Secondary | ICD-10-CM | POA: Insufficient documentation

## 2019-10-03 DIAGNOSIS — Z01818 Encounter for other preprocedural examination: Secondary | ICD-10-CM | POA: Insufficient documentation

## 2019-10-03 LAB — BASIC METABOLIC PANEL
Anion gap: 9 (ref 5–15)
BUN: 16 mg/dL (ref 8–23)
CO2: 24 mmol/L (ref 22–32)
Calcium: 8.9 mg/dL (ref 8.9–10.3)
Chloride: 102 mmol/L (ref 98–111)
Creatinine, Ser: 1.15 mg/dL (ref 0.61–1.24)
GFR calc Af Amer: >60 mL/min (ref >60)
GFR calc non Af Amer: >60 mL/min (ref >60)
Glucose, Bld: 311 mg/dL — ABNORMAL HIGH (ref 70–99)
Potassium: 5.2 mmol/L — ABNORMAL HIGH (ref 3.5–5.1)
Sodium: 135 mmol/L (ref 135–145)

## 2019-10-03 NOTE — Progress Notes (Signed)

## 2019-10-04 NOTE — Progress Notes (Signed)
Glucose-311, notified Dr. Erling Conte at Dr. Levell July office, and left message for patient.

## 2019-10-07 ENCOUNTER — Other Ambulatory Visit (HOSPITAL_COMMUNITY): Payer: 59 | Attending: Orthopedic Surgery

## 2019-10-08 ENCOUNTER — Other Ambulatory Visit (HOSPITAL_COMMUNITY)
Admission: RE | Admit: 2019-10-08 | Discharge: 2019-10-08 | Disposition: A | Discharge status: Home / Self Care | Payer: 59 | Source: Ambulatory Visit | Attending: Orthopedic Surgery | Admitting: Orthopedic Surgery

## 2019-10-08 DIAGNOSIS — Z20828 Contact with and (suspected) exposure to other viral communicable diseases: Secondary | ICD-10-CM | POA: Insufficient documentation

## 2019-10-08 DIAGNOSIS — Z01812 Encounter for preprocedural laboratory examination: Secondary | ICD-10-CM | POA: Insufficient documentation

## 2019-10-08 LAB — SARS CORONAVIRUS 2 (TAT 6-24 HRS): SARS Coronavirus 2: NEGATIVE

## 2019-10-10 NOTE — Anesthesia Preprocedure Evaluation (Addendum)
Anesthesia Evaluation  Patient identified by MRN, date of birth, ID band Patient awake    Reviewed: Allergy & Precautions, NPO status , Patient's Chart, lab work & pertinent test results  Airway Mallampati: II  TM Distance: >3 FB Neck ROM: Full    Dental no notable dental hx. (+) Teeth Intact   Pulmonary sleep apnea ,    Pulmonary exam normal breath sounds clear to auscultation       Cardiovascular hypertension, Pt. on home beta blockers and Pt. on medications + CAD  Normal cardiovascular exam+ dysrhythmias Atrial Fibrillation  Rhythm:Regular Rate:Normal     Neuro/Psych negative neurological ROS  negative psych ROS   GI/Hepatic Neg liver ROS, GERD  ,  Endo/Other  diabetes  Renal/GU      Musculoskeletal   Abdominal (+) + obese,   Peds  Hematology negative hematology ROS (+)   Anesthesia Other Findings   Reproductive/Obstetrics                           Anesthesia Physical Anesthesia Plan  ASA: III  Anesthesia Plan: Bier Block and Bier Block-LIDOCAINE ONLY   Post-op Pain Management:    Induction:   PONV Risk Score and Plan: 1 and Treatment may vary due to age or medical condition and Midazolam  Airway Management Planned: Nasal Cannula and Natural Airway  Additional Equipment: None  Intra-op Plan:   Post-operative Plan:   Informed Consent: I have reviewed the patients History and Physical, chart, labs and discussed the procedure including the risks, benefits and alternatives for the proposed anesthesia with the patient or authorized representative who has indicated his/her understanding and acceptance.       Plan Discussed with:   Anesthesia Plan Comments:        Anesthesia Quick Evaluation

## 2019-10-11 ENCOUNTER — Encounter (HOSPITAL_BASED_OUTPATIENT_CLINIC_OR_DEPARTMENT_OTHER)
Admission: RE | Disposition: A | Discharge status: Home / Self Care | Payer: Self-pay | Source: Home / Self Care | Attending: Orthopedic Surgery

## 2019-10-11 ENCOUNTER — Encounter (HOSPITAL_BASED_OUTPATIENT_CLINIC_OR_DEPARTMENT_OTHER): Payer: Self-pay | Admitting: *Deleted

## 2019-10-11 ENCOUNTER — Ambulatory Visit (HOSPITAL_BASED_OUTPATIENT_CLINIC_OR_DEPARTMENT_OTHER): Payer: 59 | Admitting: Anesthesiology

## 2019-10-11 ENCOUNTER — Ambulatory Visit (HOSPITAL_BASED_OUTPATIENT_CLINIC_OR_DEPARTMENT_OTHER)
Admission: RE | Admit: 2019-10-11 | Discharge: 2019-10-11 | Disposition: A | Discharge status: Home / Self Care | Payer: 59 | Attending: Orthopedic Surgery | Admitting: Orthopedic Surgery

## 2019-10-11 ENCOUNTER — Other Ambulatory Visit: Payer: Self-pay

## 2019-10-11 DIAGNOSIS — I2510 Atherosclerotic heart disease of native coronary artery without angina pectoris: Secondary | ICD-10-CM | POA: Insufficient documentation

## 2019-10-11 DIAGNOSIS — I480 Paroxysmal atrial fibrillation: Secondary | ICD-10-CM | POA: Diagnosis not present

## 2019-10-11 DIAGNOSIS — E669 Obesity, unspecified: Secondary | ICD-10-CM | POA: Diagnosis not present

## 2019-10-11 DIAGNOSIS — M25839 Other specified joint disorders, unspecified wrist: Secondary | ICD-10-CM | POA: Diagnosis not present

## 2019-10-11 DIAGNOSIS — G5602 Carpal tunnel syndrome, left upper limb: Secondary | ICD-10-CM | POA: Diagnosis not present

## 2019-10-11 DIAGNOSIS — E1136 Type 2 diabetes mellitus with diabetic cataract: Secondary | ICD-10-CM | POA: Diagnosis not present

## 2019-10-11 DIAGNOSIS — G4730 Sleep apnea, unspecified: Secondary | ICD-10-CM | POA: Diagnosis not present

## 2019-10-11 DIAGNOSIS — Z6837 Body mass index (BMI) 37.0-37.9, adult: Secondary | ICD-10-CM | POA: Insufficient documentation

## 2019-10-11 DIAGNOSIS — I10 Essential (primary) hypertension: Secondary | ICD-10-CM | POA: Insufficient documentation

## 2019-10-11 DIAGNOSIS — M19032 Primary osteoarthritis, left wrist: Secondary | ICD-10-CM | POA: Diagnosis not present

## 2019-10-11 HISTORY — PX: CARPAL TUNNEL RELEASE: SHX101

## 2019-10-11 LAB — GLUCOSE, CAPILLARY
Glucose-Capillary: 160 mg/dL — ABNORMAL HIGH (ref 70–99)
Glucose-Capillary: 152 mg/dL — ABNORMAL HIGH (ref 70–99)

## 2019-10-11 SURGERY — CARPAL TUNNEL RELEASE
Anesthesia: Regional | Laterality: Left

## 2019-10-11 MED ORDER — ONDANSETRON HCL 4 MG/2ML IJ SOLN
INTRAMUSCULAR | Status: DC | PRN
  Administered 2019-10-11: 4 mg via INTRAVENOUS

## 2019-10-11 MED ORDER — PROPOFOL 10 MG/ML IV BOLUS
INTRAVENOUS | Status: DC | PRN
  Administered 2019-10-11 (×2): 20 mg via INTRAVENOUS

## 2019-10-11 MED ORDER — MIDAZOLAM HCL 2 MG/2ML IJ SOLN
0.5000 mg | INTRAMUSCULAR | Status: DC | PRN
  Administered 2019-10-11 (×2): 1 mg via INTRAVENOUS

## 2019-10-11 MED ORDER — CHLORHEXIDINE GLUCONATE 4 % EX LIQD: 60.0000 mL | Freq: Once | CUTANEOUS | Status: DC

## 2019-10-11 MED ORDER — FENTANYL CITRATE (PF) 100 MCG/2ML IJ SOLN: 25.0000 ug | INTRAMUSCULAR | Status: DC | PRN

## 2019-10-11 MED ORDER — FENTANYL CITRATE (PF) 100 MCG/2ML IJ SOLN
INTRAMUSCULAR | Status: AC
  Filled 2019-10-11: qty 2

## 2019-10-11 MED ORDER — ONDANSETRON HCL 4 MG/2ML IJ SOLN: 4.0000 mg | Freq: Once | INTRAMUSCULAR | Status: DC | PRN

## 2019-10-11 MED ORDER — FENTANYL CITRATE (PF) 100 MCG/2ML IJ SOLN
50.0000 ug | INTRAMUSCULAR | Status: DC | PRN
  Administered 2019-10-11 (×2): 50 ug via INTRAVENOUS

## 2019-10-11 MED ORDER — CEFAZOLIN SODIUM-DEXTROSE 2-4 GM/100ML-% IV SOLN
2.0000 g | INTRAVENOUS | Status: AC
  Administered 2019-10-11: 2 g via INTRAVENOUS

## 2019-10-11 MED ORDER — LIDOCAINE HCL (PF) 0.5 % IJ SOLN
INTRAMUSCULAR | Status: DC | PRN
  Administered 2019-10-11: 30 mL via INTRAVENOUS

## 2019-10-11 MED ORDER — HYDROCODONE-ACETAMINOPHEN 5-325 MG PO TABS: 1.0000 | ORAL_TABLET | Freq: Four times a day (QID) | ORAL | 0 refills | Status: DC | PRN

## 2019-10-11 MED ORDER — LACTATED RINGERS IV SOLN
INTRAVENOUS | Status: DC
  Administered 2019-10-11: 08:00:00 via INTRAVENOUS

## 2019-10-11 MED ORDER — BUPIVACAINE HCL (PF) 0.25 % IJ SOLN
INTRAMUSCULAR | Status: DC | PRN
  Administered 2019-10-11: 9 mL

## 2019-10-11 MED ORDER — CEFAZOLIN SODIUM-DEXTROSE 2-4 GM/100ML-% IV SOLN
INTRAVENOUS | Status: AC
  Filled 2019-10-11: qty 100

## 2019-10-11 MED ORDER — ACETAMINOPHEN 10 MG/ML IV SOLN: 1000.0000 mg | Freq: Once | INTRAVENOUS | Status: DC | PRN

## 2019-10-11 MED ORDER — MIDAZOLAM HCL 2 MG/2ML IJ SOLN
INTRAMUSCULAR | Status: AC
  Filled 2019-10-11: qty 2

## 2019-10-11 SURGICAL SUPPLY — 34 items
APL PRP STRL LF DISP 70% ISPRP (MISCELLANEOUS) ×1
BLADE SURG 15 STRL LF DISP TIS (BLADE) ×1 IMPLANT
BLADE SURG 15 STRL SS (BLADE) ×3
BNDG CMPR 9X4 STRL LF SNTH (GAUZE/BANDAGES/DRESSINGS) ×1
BNDG COHESIVE 3X5 TAN STRL LF (GAUZE/BANDAGES/DRESSINGS) ×3 IMPLANT
BNDG ESMARK 4X9 LF (GAUZE/BANDAGES/DRESSINGS) ×2 IMPLANT
BNDG GAUZE ELAST 4 BULKY (GAUZE/BANDAGES/DRESSINGS) ×3 IMPLANT
CHLORAPREP W/TINT 26 (MISCELLANEOUS) ×3 IMPLANT
CORD BIPOLAR FORCEPS 12FT (ELECTRODE) ×3 IMPLANT
COVER BACK TABLE REUSABLE LG (DRAPES) ×3 IMPLANT
COVER MAYO STAND REUSABLE (DRAPES) ×3 IMPLANT
CUFF TOURN SGL QUICK 18X4 (TOURNIQUET CUFF) ×3 IMPLANT
DRAPE EXTREMITY T 121X128X90 (DISPOSABLE) ×3 IMPLANT
DRAPE SURG 17X23 STRL (DRAPES) ×3 IMPLANT
DRSG PAD ABDOMINAL 8X10 ST (GAUZE/BANDAGES/DRESSINGS) ×3 IMPLANT
GAUZE SPONGE 4X4 12PLY STRL (GAUZE/BANDAGES/DRESSINGS) ×3 IMPLANT
GAUZE XEROFORM 1X8 LF (GAUZE/BANDAGES/DRESSINGS) ×3 IMPLANT
GLOVE BIOGEL PI IND STRL 8.5 (GLOVE) ×1 IMPLANT
GLOVE BIOGEL PI INDICATOR 8.5 (GLOVE) ×2
GLOVE SURG ORTHO 8.0 STRL STRW (GLOVE) ×3 IMPLANT
GOWN STRL REUS W/ TWL LRG LVL3 (GOWN DISPOSABLE) ×1 IMPLANT
GOWN STRL REUS W/TWL LRG LVL3 (GOWN DISPOSABLE) ×3
GOWN STRL REUS W/TWL XL LVL3 (GOWN DISPOSABLE) ×3 IMPLANT
NDL PRECISIONGLIDE 27X1.5 (NEEDLE) IMPLANT
NEEDLE PRECISIONGLIDE 27X1.5 (NEEDLE) IMPLANT
NS IRRIG 1000ML POUR BTL (IV SOLUTION) ×3 IMPLANT
PACK BASIN DAY SURGERY FS (CUSTOM PROCEDURE TRAY) ×3 IMPLANT
STOCKINETTE 4X48 STRL (DRAPES) ×3 IMPLANT
SUT ETHILON 4 0 PS 2 18 (SUTURE) ×3 IMPLANT
SUT VICRYL 4-0 PS2 18IN ABS (SUTURE) IMPLANT
SYR BULB 3OZ (MISCELLANEOUS) ×3 IMPLANT
SYR CONTROL 10ML LL (SYRINGE) ×2 IMPLANT
TOWEL GREEN STERILE FF (TOWEL DISPOSABLE) ×3 IMPLANT
UNDERPAD 30X36 HEAVY ABSORB (UNDERPADS AND DIAPERS) ×3 IMPLANT

## 2019-10-11 NOTE — Transfer of Care (Signed)
Immediate Anesthesia Transfer of Care Note  Patient: Lucas [name]  Procedure(s) Performed: LEFT CARPAL TUNNEL RELEASE (Left )  Patient Location: PACU  Anesthesia Type:MAC and Bier block  Level of Consciousness: awake, alert  and oriented  Airway & Oxygen Therapy: Patient Spontanous Breathing and Patient connected to face mask oxygen  Post-op Assessment: Report given to RN and Post -op Vital signs reviewed and stable  Post vital signs: Reviewed and stable  Last Vitals:  Vitals Value Taken Time  BP    Temp    Pulse    Resp    SpO2      Last Pain:  Vitals:   10/11/19 0816  TempSrc: Oral  PainSc: 2          Complications: No apparent anesthesia complications

## 2019-10-11 NOTE — Addendum Note (Signed)
Addendum  created 10/11/19 1135 by Willa Frater, CRNA   Intraprocedure Meds edited

## 2019-10-11 NOTE — Anesthesia Postprocedure Evaluation (Signed)
Anesthesia Post Note  Patient: Lucas [name]  Procedure(s) Performed: LEFT CARPAL TUNNEL RELEASE (Left )     Patient location during evaluation: PACU Anesthesia Type: Bier Block Level of consciousness: awake and alert Pain management: pain level controlled Vital Signs Assessment: post-procedure vital signs reviewed and stable Respiratory status: spontaneous breathing, nonlabored ventilation, respiratory function stable and patient connected to nasal cannula oxygen Cardiovascular status: stable and blood pressure returned to baseline Postop Assessment: no apparent nausea or vomiting Anesthetic complications: no    Last Vitals:  Vitals:   10/11/19 1100 10/11/19 1115  BP: (!) 142/86 139/86  Pulse: 64 62  Resp: 18 16  Temp: 36.6 C   SpO2: 93% 93%    Last Pain:  Vitals:   10/11/19 1056  TempSrc:   PainSc: 0-No pain                 Barnet Glasgow
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
## 2019-10-11 NOTE — Op Note (Signed)
NAME: Kiyoshi Scherf [facility] MEDICAL RECORD NO: [id number] DATE OF BIRTH: 1956/06/08 FACILITY[facility] LOCATION: [facility] PHYSICIAN: Wynonia Sours, MD   OPERATIVE REPORT   DATE OF PROCEDURE: 10/11/19    PREOPERATIVE DIAGNOSIS:   Carpal tunnel syndrome left hand   POSTOPERATIVE DIAGNOSIS:   Same   PROCEDURE:   Decompression median nerve left hand   SURGEON: Daryll Brod, M.D.   ASSISTANT: none   ANESTHESIA:  Bier block with sedation and Local   INTRAVENOUS FLUIDS:  Per anesthesia flow sheet.   ESTIMATED BLOOD LOSS:  Minimal.   COMPLICATIONS:  None.   SPECIMENS:  none   TOURNIQUET TIME:    Total Tourniquet Time Documented: Upper Arm (Left) - 23 minutes Total: Upper Arm (Left) - 23 minutes    DISPOSITION:  Stable to PACU.   INDICATIONS: Patient is a 63 year old male with history of carpal tunnel syndrome bilaterally.  He has undergone release on his right side he is admitted now for release on his left nerve conductions are positive for this not responded to conservative treatment.  Preoperative area the patient is seen extremity marked by both patient and surgeon antibiotic given  OPERATIVE COURSE: Patient is brought to the operating room where form based IV regional anesthetic was carried out without difficulty under the direction the anesthesia department.  Was prepped using ChloraPrep in the supine position with a left arm free.  A 3-minute dry time was allowed and a timeout taken to confirm patient procedure.  A longitudinal incision was then made in the left palm carried down through subcutaneous tissue.  Bleeders were electrocauterized with bipolar.  Palmar fascia was split.  The superficial palmar arch was identified.  The flexor tendon to the ring and little finger was identified.  Retractors were placed retracting the flexor tendons median nerve radially.  Retractors were placed protecting the ulnar nerve ulnarly.  The flexor retinaculum was then read released  on its ulnar border.  Right angle and stool retractor placed between skin and forearm fascia.  The deep structures were dissected free with blunt dissection.  Blunt scissors were then used to release the proximal aspect of the foot flexor retinaculum distal forearm fascia for approximately 2 cm proximal to the wrist crease under direct vision.  The canal was explored.  Area compression to the nerve was apparent.  Motor branch entered in the muscle distally.  Wound was copiously irrigated with saline.  The skin was closed interrupted 4-0 nylon sutures.  A local infiltration of quarter percent bupivacaine without epinephrine was given 9 cc was used.  A sterile compressive dressing with fingers 3 was applied.  Deflation of the tourniquet all fingers immediately pink.  He was taken to the recovery room for observation in satisfactory condition.  He will be discharged home to return [place] in 1 week on Norco.   Daryll Brod, MD Electronically signed, 10/11/19

## 2019-10-11 NOTE — Brief Op Note (Signed)
10/11/2019  10:51 AM  PATIENT:  Lucas [name]  63 y.o. male  PRE-OPERATIVE DIAGNOSIS:  LEFT CARPAL TUNNEL SYNDROME  POST-OPERATIVE DIAGNOSIS:  LEFT CARPAL TUNNEL SYNDROME  PROCEDURE:  Procedure(s) with comments: LEFT CARPAL TUNNEL RELEASE (Left) - IV REGIONAL FOREARM BLOCK  SURGEON:  Surgeon(s) and Role:    * Daryll Brod, MD - Primary  PHYSICIAN ASSISTANT:   ASSISTANTS: none   ANESTHESIA:   local, regional and IV sedation  EBL:  2 mL   BLOOD ADMINISTERED:none  DRAINS: none   LOCAL MEDICATIONS USED:  BUPIVICAINE   SPECIMEN:  No Specimen  DISPOSITION OF SPECIMEN:  [name]  COUNTS:  YES  TOURNIQUET:   Total Tourniquet Time Documented: Upper Arm (Left) - 23 minutes Total: Upper Arm (Left) - 23 minutes   DICTATION: .Viviann Spare Dictation  PLAN OF CARE: Discharge to home after PACU  PATIENT DISPOSITION:  PACU - hemodynamically stable.

## 2019-10-11 NOTE — Discharge Instructions (Addendum)

## 2019-10-12 ENCOUNTER — Encounter (HOSPITAL_BASED_OUTPATIENT_CLINIC_OR_DEPARTMENT_OTHER): Payer: Self-pay | Admitting: Orthopedic Surgery

## 2019-10-12 NOTE — Addendum Note (Signed)
Addendum  created 10/12/19 1247 by Lakea Mittelman, Ernesta Amble, CRNA   Charge Capture section accepted

## 2019-10-27 ENCOUNTER — Telehealth: Payer: Self-pay | Admitting: Cardiology

## 2019-10-27 MED ORDER — ISOSORBIDE MONONITRATE ER 60 MG PO TB24: 90.0000 mg | ORAL_TABLET | Freq: Every day | ORAL | 1 refills | Status: DC

## 2019-10-27 NOTE — Telephone Encounter (Signed)
Done

## 2019-10-27 NOTE — Telephone Encounter (Signed)
Needing refill on isosorbide mononitrate (IMDUR) 60 MG 24 hr tablet [id number]  [facility] [place]-- has apt w/ JB in [place] office on 11/29/2019

## 2019-11-14 ENCOUNTER — Other Ambulatory Visit: Payer: Self-pay | Admitting: Cardiology

## 2019-11-29 ENCOUNTER — Encounter: Payer: Self-pay | Admitting: Cardiology

## 2019-11-29 ENCOUNTER — Ambulatory Visit: Payer: 59 | Admitting: Cardiology

## 2019-11-29 VITALS — BP 121/73 | HR 61 | Temp 98.3°F | Ht 70.0 in | Wt 265.0 lb

## 2019-11-29 DIAGNOSIS — I2510 Atherosclerotic heart disease of native coronary artery without angina pectoris: Secondary | ICD-10-CM

## 2019-11-29 DIAGNOSIS — I712 Thoracic aortic aneurysm, without rupture, unspecified: Secondary | ICD-10-CM

## 2019-11-29 DIAGNOSIS — R0989 Other specified symptoms and signs involving the circulatory and respiratory systems: Secondary | ICD-10-CM

## 2019-11-29 DIAGNOSIS — I480 Paroxysmal atrial fibrillation: Secondary | ICD-10-CM | POA: Diagnosis not present

## 2019-11-29 DIAGNOSIS — I10 Essential (primary) hypertension: Secondary | ICD-10-CM

## 2019-11-29 NOTE — Patient Instructions (Signed)
Medication Instructions:  Your physician recommends that you continue on your current medications as directed. Please refer to the Current Medication list given to you today.   Labwork: NONE  Testing/Procedures: Your physician has requested that you have a carotid duplex. This test is an ultrasound of the carotid arteries in your neck. It looks at blood flow through these arteries that supply the brain with blood. Allow one hour for this exam. There are no restrictions or special instructions.  Non-Cardiac CT Angiography (CTA), is a special type of CT scan that uses a computer to produce multi-dimensional views of major blood vessels throughout the body. In CT angiography, a contrast material is injected through an IV to help visualize the blood vessels   Follow-Up: Your physician wants you to follow-up in: 6 MONTHS .  You will receive a reminder letter in the mail two months in advance. If you don't receive a letter, please call our office to schedule the follow-up appointment.   Any Other Special Instructions Will Be Listed Below (If Applicable).     If you need a refill on your cardiac medications before your next appointment, please call your pharmacy.

## 2019-11-29 NOTE — Progress Notes (Signed)
Clinical Summary Mr. Almond is a 64 y.o.male seen today for follow up of the following medical problems.  1. CAD - previous chest pain episode occurred in the setting of afib with RVR - cath 01/29/16 with apical LAD disease, treated medically.Moderate RCA and D2 disease.  - 03/2018 cath distal LAD 50%, mid RCA 60%, LPDA 45%, D2 45%, mildly elevated LVEDP of 18   - no recent chest pain. No SOB or DOE.    2. Aflutter/Afib - CHADS2Vasc score of 2, on xarelto - s/p aflutter ablation 05/2016 by Dr Curt Bears. 01/2019 event monitor: short episode of afib, SR with PACs - toprol was increased to 150mg  daily    - rare symptoms, compliatn with meds.  - no recent bleeding on xarelto.    3. Aortic aneurysm - [facility]10/2019 CT ordered by pcp showed 4.3 ascending aortic aneurysm - he reports has not had repeat CT since  4. HTN - compliant with meds  SH: works Economist. Has a 66 yo [place]     Past Medical History:  Diagnosis Date  . Arthritis   . Bilateral carpal tunnel syndrome 08/16/2019  . CAD (coronary artery disease)    a. LHC on 01/29/16 which revealed significant apical LAD stenosis, best treated medically. There was moderate disease in the D2 and mid nondominant RCA.[name] No PCI performed b. 03/2018: repeat cath showing regression of his LAD stenosis now being at 50% with 60% mid RCA stenosis, 45% ostial LPDA stenosis, and 45% ostial second diagonal stenosis.  . Cataract   . Diabetes mellitus    type 2  . GERD (gastroesophageal reflux disease)   . Heart disease   . Hyperlipidemia   . Hypertension   . Obesity   . PAF (paroxysmal atrial fibrillation) ([place]) 01/2016  . Sleep apnea    has CPAP machine but not wear  . Tubular adenoma of colon 08/2012  . Ulnar neuropathy at elbow 08/16/2019   Bilateral     Allergies  Allergen Reactions  . Bee Venom Swelling  . Codeine Nausea Only     Current Outpatient Medications  Medication Sig Dispense Refill    . acetaminophen (TYLENOL) 325 MG tablet Take 650 mg by mouth every 6 (six) hours as needed for moderate pain or headache.     [name] atorvastatin (LIPITOR) 80 MG tablet Take 1 tablet (80 mg total) by mouth daily at 6 PM. 90 tablet 3  . benazepril (LOTENSIN) 20 MG tablet Take 20 mg by mouth daily.    [name] diltiazem (CARDIZEM) 30 MG tablet Take 1 tab 2 times daily as needed for palpitations (Patient taking differently: Take 30 mg by mouth 2 (two) times daily as needed (for palpitations). ) 180 tablet 3  . Empagliflozin-metFORMIN HCl ER (SYNJARDY XR) 25-1000 MG TB24 Take by mouth.    . furosemide (LASIX) 20 MG tablet TAKE 1 TABLET (20 MG TOTAL) BY MOUTH DAILY AS NEEDED FOR FLUID. 90 tablet 1  . glimepiride (AMARYL) 4 MG tablet Take 4 mg by mouth 2 (two) times daily.     [name] HYDROcodone-acetaminophen (NORCO) 5-325 MG tablet Take 1 tablet by mouth every 6 (six) hours as needed. 20 tablet 0  . HYDROcodone-acetaminophen (NORCO) 5-325 MG tablet Take 1 tablet by mouth every 6 (six) hours as needed. 20 tablet 0  . isosorbide mononitrate (IMDUR) 60 MG 24 hr tablet Take 1.5 tablets (90 mg total) by mouth daily. 135 tablet 1  . metoprolol succinate (TOPROL-XL) 100 MG 24  hr tablet TAKE 1 &1/2 TABLETS BY MOUTH DAILY WITH OR IMMEDIATELY FOLLOWING A MEAL 135 tablet 3  . nitroGLYCERIN (NITROSTAT) 0.4 MG SL tablet Place 1 tablet (0.4 mg total) under the tongue every 5 (five) minutes as needed for chest pain. 25 tablet 3  . pantoprazole (PROTONIX) 40 MG tablet Take 40 mg by mouth daily.    . rivaroxaban (XARELTO) 20 MG TABS tablet Take 1 tablet (20 mg total) by mouth daily with supper. 90 tablet 3  . Semaglutide (OZEMPIC, 1 MG/DOSE, ) Inject 0.5 mLs into the skin once a week.     No current facility-administered medications for this visit.     Past Surgical History:  Procedure Laterality Date  . ANKLE SURGERY Left 2012   tendon repair  . CARDIAC CATHETERIZATION [name] 01/29/2016   Procedure: Left Heart Cath and Coronary  Angiography;  Surgeon: [name], MD;  Location: [facility];  Service: Cardiovascular;  Laterality: [name];  . CARPAL TUNNEL RELEASE Right 09/06/2019   Procedure: CARPAL TUNNEL RELEASE;  Surgeon: Daryll Brod, MD;  Location: [place];  Service: Orthopedics;  Laterality: Right;  . CARPAL TUNNEL RELEASE Left 10/11/2019   Procedure: LEFT CARPAL TUNNEL RELEASE;  Surgeon: Daryll Brod, MD;  Location: [facility];  Service: Orthopedics;  Laterality: Left;  IV REGIONAL FOREARM BLOCK  . CATARACT EXTRACTION W/PHACO  11/11/2012   Procedure: CATARACT EXTRACTION PHACO AND INTRAOCULAR LENS PLACEMENT (IOC);  Surgeon: Tonny Mildred Bollard, MD;  Location[facility];  Service: Ophthalmology;  Laterality: Right;  CDE:  5.56  . CATARACT EXTRACTION W/PHACO Left 12/15/2013   Procedure: CATARACT EXTRACTION PHACO AND INTRAOCULAR LENS PLACEMENT (IOC);  Surgeon: Tonny Trevante Tennell, MD;  [facility]: [facility];  Service: Ophthalmology;  Laterality: Left;  CDE:13.36  . CHOLECYSTECTOMY    . CHONDROPLASTY Right 05/28/2017   Procedure: RIGHT KNEE CHONDROPLASTY;  Surgeon: [name], MD;  Location: [facility];  Service: Orthopedics;  Laterality: Right;  . COLONOSCOPY    . CYSTOSCOPY W/ URETERAL STENT PLACEMENT     right  . ELECTROPHYSIOLOGIC STUDY [name] 06/02/2016   Procedure: A-Flutter Ablation;  Surgeon: [name], MD;  Location: [facility];  Service: Cardiovascular;  Laterality: [name];  . KNEE ARTHROSCOPY WITH MEDIAL MENISECTOMY Right 05/28/2017   Procedure: KNEE ARTHROSCOPY WITH MEDIAL MENISECTOMY WITH EXTENSIVE SYNOVECTOMY;  Surgeon: [name], MD;  Location: [place];  Service: Orthopedics;  Laterality: Right;  . LEFT HEART CATH AND CORONARY ANGIOGRAPHY [name] 03/30/2018   Procedure: LEFT HEART CATH AND CORONARY ANGIOGRAPHY;  Surgeon: Leonie Man, MD;  Location: [place] [facility];  Service: Cardiovascular;  Laterality: [name];  . left knee sugery Left     Arthroscopy  . NASAL SINUS SURGERY    . POLYPECTOMY    . TONSILLECTOMY       Allergies  Allergen Reactions  . Bee Venom Swelling  . Codeine Nausea Only      Family History  Problem Relation Age of Onset  . Heart murmur Mother   . Aneurysm Mother   . Hypertension Mother   . Cirrhosis Father   . Heart attack Sister   . Diabetes Sister   . Aneurysm Maternal Grandmother   . Stroke Maternal Grandmother   . Heart attack Paternal Grandmother   . Stroke Sister   . Colon cancer Neg Hx   . Rectal cancer Neg Hx   . Stomach cancer Neg Hx      Social  History Mr. Schmiesing reports that he has never smoked. He has never used smokeless tobacco. Mr. Steenbergen reports current alcohol use.   Review of Systems CONSTITUTIONAL: No weight loss, fever, chills, weakness or fatigue.  HEENT: Eyes: No visual loss, blurred vision, double vision or yellow sclerae.No hearing loss, sneezing, congestion, runny nose or sore throat.  SKIN: No rash or itching.  CARDIOVASCULAR: per hpi RESPIRATORY: No shortness of breath, cough or sputum.  GASTROINTESTINAL: No anorexia, nausea, vomiting or diarrhea. No abdominal pain or blood.  GENITOURINARY: No burning on urination, no polyuria NEUROLOGICAL: No headache, dizziness, syncope, paralysis, ataxia, numbness or tingling in the extremities. No change in bowel or bladder control.  MUSCULOSKELETAL: No muscle, back pain, joint pain or stiffness.  LYMPHATICS: No enlarged nodes. No history of splenectomy.  PSYCHIATRIC: No history of depression or anxiety.  ENDOCRINOLOGIC: No reports of sweating, cold or heat intolerance. No polyuria or polydipsia.  [name]   Physical Examination Today's Vitals   11/29/19 1025  BP: 121/73  Pulse: 61  Temp: 98.3 F (36.8 C)  TempSrc: Temporal  SpO2: 96%  Weight: 265 lb (120.2 kg)  Height: 5\' 10"  (1.778 m)   Body mass index is 38.02 kg/m.  Gen: resting comfortably, no acute distress HEENT: no scleral icterus, pupils equal round  and reactive, no palptable cervical adenopathy,  CV: RRR, nol m/r/g, no jvd. Right sided carotid bruit Resp: Clear to auscultation bilaterally GI: abdomen is soft, non-tender, non-distended, normal bowel sounds, no hepatosplenomegaly MSK: extremities are warm, no edema.  Skin: warm, no rash Neuro:  no focal deficits Psych: appropriate affect   Diagnostic Studies 01/2016 echo Study Conclusions  - Left ventricle: The cavity size was mildly dilated. Wall  thickness was increased in a pattern of mild LVH. Systolic  function was normal. The estimated ejection fraction was in the  range of 55% to 60%.  01/2016 cath 1. Dist LAD lesion, 80% stenosed. 2. Mid RCA lesion, 60% stenosed. 3. Ost 2nd Diag to 2nd Diag lesion, 60% stenosed. 4. 2nd Mrg lesion, 30% stenosed. 5. Mid LAD lesion, 25% stenosed. 6. LPDA lesion, 45% stenosed.   Significant apical LAD stenosis, best treated with medication. Moderate disease in the second diagonal and mid nondominant RCA.  Normal left ventricular systolic function. Normal left ventricular filling pressures.  Post Cath Recommendations:   Medical therapy of both atrial fibrillation and coronary artery disease. Percutaneous intervention is not currently indicated for coronary disease.  03/2018 cath  Dist LAD lesion is 50% stenosed. -Demonstrates regression of disease  Mid RCA (nondominant) stable lesion is 60% stenosed. Stable  Ost LPDA to LPDA lesion is 45% stenosed. Stable  Ost 2nd Diag lesion is 45% stenosed. Demonstrates progression of disease  The left ventricular systolic function is normal. The left ventricular ejection fraction is 55-65% by visual estimate.  LV end diastolic pressure is mildly elevated.  Angiographically stable to improved coronary disease with notably less significant disease in the diagonal Alvie Speltz as well as the apical LAD.  No potential culprit lesion to explain the patient's symptoms. Mildly elevated  LVEDP.  Plan:  Discharge home after TR band removal and bedrest.  Restart Xarelto tonight.  01/2019 event monitor  14 day event monitor  Min HR 54, Max HR 120, Avg HR 76  Sinus rhythm with PACs, with episode of afib rate 120  Reported symptoms correlated with sinus rhythm and PACs    Assessment and Plan  1. CAD -no symptoms, continue current medds  2. Aflutter/Afib - rare  palpitations, continue current meds  3. HTN - at goal, continue current meds   4. Aortic aneurysm - repeat CTA   F/u 6 months   Arnoldo Lenis, M.D.

## 2019-11-30 ENCOUNTER — Telehealth: Payer: Self-pay

## 2019-11-30 NOTE — Telephone Encounter (Signed)
Pt made aware. Voiced understanding. He stated that the nitro helped. He will check vitals later and he agrees if the pain returns or gets worse he will go to ED to be evaluates.

## 2019-11-30 NOTE — Telephone Encounter (Signed)
Pt called in complaining with mild  chest pain. He states that it is just a little discomfort. He did take a nitro with some relief. He does not have any dizziness, nausea, rapid heart rates or new SOB. He did states it may be stress related. His wife stated that he has an aortic aneurysm and had not had it checked in over a year, but CT is scheduled. Please advise.

## 2019-11-30 NOTE — Telephone Encounter (Signed)
Dr. Harl Bowie evaluated him yesterday and he appeared to be stable. I reviewed his note. Would monitor vitals at home. Take nitro prn. He apparently takes 90 mg of Imdur. This could be increased to 120 mg daily. I will CC Dr. Harl Bowie so he can also review tomorrow.

## 2019-12-01 NOTE — Telephone Encounter (Signed)
Increase imdur to 120mg  daily, update [place] on symptoms early next week. If severe symptoms over the weekend please have him contact the on call.   Zandra Abts MD

## 2019-12-02 MED ORDER — ISOSORBIDE MONONITRATE ER 120 MG PO TB24: 120.0000 mg | ORAL_TABLET | Freq: Every day | ORAL | 3 refills | Status: DC

## 2019-12-02 NOTE — Telephone Encounter (Signed)
PT was informed on Tuesday afternoon to increase imdur. He was advised to call if symptoms persisted.

## 2019-12-02 NOTE — Addendum Note (Signed)
Addended by: Debbora Lacrosse R on: 12/02/2019 07:49 AM   Modules accepted: Orders

## 2019-12-13 ENCOUNTER — Ambulatory Visit (HOSPITAL_COMMUNITY)
Admission: RE | Admit: 2019-12-13 | Discharge: 2019-12-13 | Disposition: A | Discharge status: Home / Self Care | Payer: 59 | Source: Ambulatory Visit | Attending: Cardiology | Admitting: Cardiology

## 2019-12-13 ENCOUNTER — Other Ambulatory Visit: Payer: Self-pay

## 2019-12-13 DIAGNOSIS — R0989 Other specified symptoms and signs involving the circulatory and respiratory systems: Secondary | ICD-10-CM | POA: Insufficient documentation

## 2019-12-13 DIAGNOSIS — I712 Thoracic aortic aneurysm, without rupture, unspecified: Secondary | ICD-10-CM

## 2019-12-13 LAB — POCT I-STAT CREATININE: Creatinine, Ser: 0.9 mg/dL (ref 0.61–1.24)

## 2019-12-13 MED ORDER — IOHEXOL 350 MG/ML SOLN
100.0000 mL | Freq: Once | INTRAVENOUS | Status: AC | PRN
  Administered 2019-12-13: 100 mL via INTRAVENOUS

## 2019-12-14 ENCOUNTER — Telehealth: Payer: Self-pay | Admitting: Cardiology

## 2019-12-14 NOTE — Telephone Encounter (Signed)
Calling for testing results

## 2019-12-14 NOTE — Telephone Encounter (Signed)
Returned pt call. No answer, voicemail unavailable.

## 2019-12-14 NOTE — Telephone Encounter (Signed)
Spoke with pt. Informed him we will call with test results once they are resulted to [place]. Pt voiced understanding.

## 2019-12-15 ENCOUNTER — Other Ambulatory Visit: Payer: Self-pay | Admitting: Cardiology

## 2019-12-21 NOTE — Progress Notes (Signed)
Pt made aware, voiced understanding. Copy to pcp.  

## 2019-12-22 ENCOUNTER — Other Ambulatory Visit: Payer: Self-pay | Admitting: Cardiology

## 2019-12-30 ENCOUNTER — Telehealth: Payer: Self-pay | Admitting: Cardiology

## 2019-12-30 NOTE — Telephone Encounter (Signed)
Shirlean Mylar - wife called stating that patient has been having episodes of A.Fib since yesterday.

## 2020-01-06 NOTE — Telephone Encounter (Signed)
No answer

## 2020-01-17 ENCOUNTER — Telehealth: Payer: Self-pay

## 2020-01-17 NOTE — Telephone Encounter (Signed)
Pt is at work now. Pt states when he bends over he get dizzy.  Please call [phone]  Thanks renee

## 2020-01-17 NOTE — Telephone Encounter (Signed)
Returned pt call. No answer, no voicemail available as mailbox is full.

## 2020-01-17 NOTE — Telephone Encounter (Signed)
Returned call

## 2020-01-20 NOTE — Telephone Encounter (Signed)
IMdur can cause some dizziness, though we had made that change about 1.5 months ago. Lots of other potential causes of dizziness. Is he staying well hydrated, drinking at least 4-6 bottles of water today. Dehydration combined with his medications very commonly can cause dizziness   Zandra Abts MD

## 2020-01-20 NOTE — Telephone Encounter (Signed)
Patient will increase his oral intake

## 2020-01-20 NOTE — Telephone Encounter (Signed)
Patient spoke with nurse at work and was told not to bend over but to squat to avoid dizziness. He has MRI of right shoulder today to evaluate arm/shoulder pain.  He wondered if the increase in Imdur could have caused dizziness but he took his BP and states it was a normal reading.   He will let us know reults of MRI next week.    I will FYI Dr.Branch

## 2020-01-27 ENCOUNTER — Telehealth: Payer: Self-pay | Admitting: Cardiology

## 2020-01-27 NOTE — Telephone Encounter (Signed)
Pt voiced understanding - updated medication list and wife will update [place] next week

## 2020-01-27 NOTE — Telephone Encounter (Signed)
Increase toprol to 200mg  daily, update [place] again next week   J Drue Harr DM

## 2020-01-27 NOTE — Telephone Encounter (Signed)
Robin-wife called stating that around 1100pm last evening he Atrial Fib- states it is happening more often. Patient went to work but very tired (cell # [phone])

## 2020-01-27 NOTE — Telephone Encounter (Signed)
Wife says HR went up in the 150s last night took 30 mg of diltiazem - says after an hour HR went down to 70 - with some discomfort in his chest - went to work today but is feeling tired - wife says seems to happening more frequently (once or twice monthly)

## 2020-02-16 ENCOUNTER — Encounter: Payer: Self-pay | Admitting: Family Medicine

## 2020-02-16 ENCOUNTER — Encounter: Payer: Self-pay | Admitting: *Deleted

## 2020-02-16 ENCOUNTER — Ambulatory Visit: Payer: 59 | Admitting: Family Medicine

## 2020-02-16 ENCOUNTER — Other Ambulatory Visit: Payer: Self-pay

## 2020-02-16 VITALS — BP 124/76 | HR 68 | Ht 70.0 in | Wt 270.6 lb

## 2020-02-16 DIAGNOSIS — I4892 Unspecified atrial flutter: Secondary | ICD-10-CM

## 2020-02-16 DIAGNOSIS — I2510 Atherosclerotic heart disease of native coronary artery without angina pectoris: Secondary | ICD-10-CM

## 2020-02-16 DIAGNOSIS — I4891 Unspecified atrial fibrillation: Secondary | ICD-10-CM | POA: Diagnosis not present

## 2020-02-16 DIAGNOSIS — I10 Essential (primary) hypertension: Secondary | ICD-10-CM

## 2020-02-16 DIAGNOSIS — I712 Thoracic aortic aneurysm, without rupture, unspecified: Secondary | ICD-10-CM

## 2020-02-16 DIAGNOSIS — I6529 Occlusion and stenosis of unspecified carotid artery: Secondary | ICD-10-CM

## 2020-02-16 NOTE — Progress Notes (Addendum)
Cardiology Office Note  Date: 02/16/2020   ID: Lucas [name], DOB 04-27-1956, MRN [id number]  PCP:  Lucas Chroman, MD  Cardiologist:  Lucas Dolly, MD Electrophysiologist:  None   Chief Complaint: A. fib, CAD, HTN.  History of Present Illness: Lucas [name] is a 64 y.o. male with a history of CA , atrial flutter/atrial fibrillation, aortic aneurysm, HTN.  Most recent cath May 2019 distal LAD 50%, mid RCA 60%, L PDA 45%, D2 45%, mildly elevated left ventricular end-diastolic pressure 18.  Last saw Lucas [name] 11/29/2019.  He was experiencing no recent chest pain or shortness of breath/DOE.  He is status post a flutter ablation 2017 by Dr. Curt [name].  Event monitor on 01/16/2019 showed short episode of atrial fibrillation, sinus rhythm with PACs.  Toprol was increased to 150 mg daily.  He was having rare symptoms and compliant with meds with no bleeding on Xarelto. Recent CT ordered by PCP 2019 showed 4.3 ascending aortic aneurysm.   Patient states he has been doing well from a cardiac standpoint.  He does complain of increased exertional fatigue since increasing the dose of Toprol-XL to 200 mg daily.  States he had one recent episode of brief palpitations for which he took one of the as needed diltiazem doses and the palpitations resolved.  He denies any other progressive anginal or exertional symptoms, orthostatic symptoms, CVA or TIA-like symptoms, or lower extremity edema.  States he has not been using his CPAP recently.  He states he thinks he may need to start back on the CPAP.  Patient states he has some issues with his right shoulder and may need surgery in the future.  He is going to see an orthopedist soon.  He states he had a recent episode of palpitations which seemed to cause his right shoulder to hurt more.  He has had no further episodes of palpitations or right shoulder pain which he associates with palpitations since.  Past Medical History:  Diagnosis Date  . Arthritis   .  Bilateral carpal tunnel syndrome 08/16/2019  . CAD (coronary artery disease)    a. LHC on 01/29/16 which revealed significant apical LAD stenosis, best treated medically. There was moderate disease in the D2 and mid nondominant RCA.[name] No PCI performed b. 03/2018: repeat cath showing regression of his LAD stenosis now being at 50% with 60% mid RCA stenosis, 45% ostial LPDA stenosis, and 45% ostial second diagonal stenosis.  . Cataract   . Diabetes mellitus    type 2  . GERD (gastroesophageal reflux disease)   . Heart disease   . Hyperlipidemia   . Hypertension   . Obesity   . PAF (paroxysmal atrial fibrillation) ([place]) 01/2016  . Sleep apnea    has CPAP machine but not wear  . Tubular adenoma of colon 08/2012  . 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 ANKLE SURGERY Left 2012   tendon repair  . CARDIAC CATHETERIZATION [name] 01/29/2016   Procedure: Left Heart Cath and Coronary Angiography;  Surgeon: [name], MD;  Location: [facility];  Service: Cardiovascular;  Laterality: [name];  . CARPAL TUNNEL RELEASE Right 09/06/2019   Procedure: CARPAL TUNNEL RELEASE;  Surgeon: Daryll Brod, MD;  Location: [facility];  Service: Orthopedics;  Laterality: Right;  . CARPAL TUNNEL RELEASE Left 10/11/2019   Procedure: LEFT CARPAL TUNNEL RELEASE;  Surgeon: Daryll Brod, MD;  Location: [facility];  Service: Orthopedics;  Laterality: Left;  IV REGIONAL FOREARM BLOCK  . CATARACT EXTRACTION W/PHACO  11/11/2012   Procedure: CATARACT EXTRACTION PHACO AND INTRAOCULAR LENS PLACEMENT (IOC);  Surgeon: [name], MD;  Location: [facility];  Service: Ophthalmology;  Laterality: Right;  CDE:  5.56  . CATARACT EXTRACTION W/PHACO Left 12/15/2013   Procedure: CATARACT EXTRACTION PHACO AND INTRAOCULAR LENS PLACEMENT (IOC);  Surgeon: [name], MD;  Location: [facility];  Service: Ophthalmology;  Laterality: Left;  CDE:13.36  .  CHOLECYSTECTOMY    . CHONDROPLASTY Right 05/28/2017   Procedure: RIGHT KNEE CHONDROPLASTY;  Surgeon: [name], MD;  Location: [facility];  Service: Orthopedics;  Laterality: Right;  . COLONOSCOPY    . CYSTOSCOPY W/ URETERAL STENT PLACEMENT     right  . ELECTROPHYSIOLOGIC STUDY [name] 06/02/2016   Procedure: A-Flutter Ablation;  Surgeon: [name], MD;  Location: [facility];  Service: Cardiovascular;  Laterality: [name];  . KNEE ARTHROSCOPY WITH MEDIAL MENISECTOMY Right 05/28/2017   Procedure: KNEE ARTHROSCOPY WITH MEDIAL MENISECTOMY WITH EXTENSIVE SYNOVECTOMY;  Surgeon: [name], MD;  Location: [facility];  Service: Orthopedics;  Laterality: Right;  . LEFT HEART CATH AND CORONARY ANGIOGRAPHY [name] 03/30/2018   Procedure: LEFT HEART CATH AND CORONARY ANGIOGRAPHY;  Surgeon: Leonie Man, MD;  Location: [facility];  Service: Cardiovascular;  Laterality: [name];  . left knee sugery Left    Arthroscopy  . NASAL SINUS SURGERY    . POLYPECTOMY    . TONSILLECTOMY      Current Outpatient Medications  Medication Sig Dispense Refill  . acetaminophen (TYLENOL) 325 MG tablet Take 650 mg by mouth every 6 (six) hours as needed for moderate pain or headache.     [name] atorvastatin (LIPITOR) 80 MG tablet TAKE 1 TABLET (80 MG TOTAL) BY MOUTH DAILY AT 6 PM. 90 tablet 3  . benazepril (LOTENSIN) 20 MG tablet Take 20 mg by mouth daily.    [name] diltiazem (CARDIZEM) 30 MG tablet Take 1 tab 2 times daily as needed for palpitations (Patient taking differently: Take 30 mg by mouth 2 (two) times daily as needed (for palpitations). ) 180 tablet 3  . furosemide (LASIX) 20 MG tablet Take 20 mg by mouth daily.    [name] glimepiride (AMARYL) 4 MG tablet Take 4 mg by mouth 2 (two) times daily.     [name] HYDROcodone-acetaminophen (NORCO) 5-325 MG tablet Take 1 tablet by mouth every 6 (six) hours as needed. 20 tablet 0  . isosorbide mononitrate (IMDUR) 120 MG 24 hr tablet Take 1  tablet (120 mg total) by mouth daily. 90 tablet 3  . metoprolol succinate (TOPROL-XL) 100 MG 24 hr tablet Take 200 mg by mouth daily. Take with or immediately following a meal.    . nitroGLYCERIN (NITROSTAT) 0.4 MG SL tablet Place 1 tablet (0.4 mg total) under the tongue every 5 (five) minutes as needed for chest pain. 25 tablet 3  . pantoprazole (PROTONIX) 40 MG tablet Take 40 mg by mouth daily.    . Semaglutide (OZEMPIC, 1 MG/DOSE, [place]) Inject 0.5 mLs into the skin once a week.    [name] SYNJARDY XR 12.03-999 MG TB24 Take 1 tablet by mouth in the morning and at bedtime.    [name] 20 MG TABS tablet TAKE 1 TABLET (20 MG TOTAL) BY MOUTH DAILY WITH SUPPER. 90 tablet 3   No current facility-administered medications for this visit.   Allergies:  Bee  venom and Codeine   Social History: The patient  reports that he has never smoked. He has never used smokeless tobacco. He reports current alcohol use. He reports that he does not use drugs.   Family History: The patient's family history includes Aneurysm in his maternal grandmother and mother; Cirrhosis in his father; Diabetes in his sister; Heart attack in his paternal grandmother and sister; Heart murmur in his mother; Hypertension in his mother; Stroke in his maternal grandmother and sister.   ROS:  Please see the history of present illness. Otherwise, complete review of systems is positive for none.  All other systems are reviewed and negative.   Physical Exam: VS:  BP 124/76   Pulse 68   Ht 5\' 10"  (1.778 m)   Wt 270 lb 9.6 oz (122.7 kg)   SpO2 96%   BMI 38.83 kg/m , BMI Body mass index is 38.83 kg/m.  Wt Readings from Last 3 Encounters:  02/16/20 270 lb 9.6 oz (122.7 kg)  11/29/19 265 lb (120.2 kg)  10/11/19 258 lb 13.1 oz (117.4 kg)    General: Patient appears comfortable at rest. Neck: Supple, no elevated JVP or carotid bruits, no thyromegaly. Lungs: Clear to auscultation, nonlabored breathing at rest. Cardiac: Regular rate and  rhythm, no S3 or significant systolic murmur, no pericardial rub. Extremities: No pitting edema, distal pulses 2+. Skin: Warm and dry. Musculoskeletal: No kyphosis. Neuropsychiatric: Alert and oriented x3, affect grossly appropriate.  ECG:  An ECG dated 02/16/2020 was personally reviewed today and demonstrated:  Sinus rhythm with frequent PACs/atrial bigeminy, incomplete left bundle branch block.  Heart rate of 69  Recent Labwork: 10/03/2019: BUN 16; Potassium 5.2; Sodium 135 12/13/2019: Creatinine, Ser 0.90     Component Value Date/Time   CHOL 195 01/29/2016 0109   TRIG 133 01/29/2016 0109   HDL 39 (L) 01/29/2016 0109   CHOLHDL 5.0 01/29/2016 0109   VLDL 27 01/29/2016 0109   LDLCALC 129 (H) 01/29/2016 0109    Other Studies Reviewed Today:  CTA chest/aorta 12/13/2019 IMPRESSION: 1. Stable uncomplicated mild fusiform aneurysmal dilatation of the ascending thoracic aorta measuring 40 mm in diameter, unchanged compared to the 2015 examination. Aortic aneurysm NOS (ICD10-I71.9). 2. Coronary artery calcifications. Aortic Atherosclerosis  Carotid artery duplex study 12/13/2019 IMPRESSION: Color duplex indicates minimal heterogeneous plaque, with no hemodynamically significant stenosis by duplex criteria in the extracranial cerebrovascular circulation.  01/2016 echo Study Conclusions - Left ventricle: The cavity size was mildly dilated. Wall  thickness was increased in a pattern of mild LVH. Systolic  function was normal. The estimated ejection fraction was in the  range of 55% to 60%.  01/2016 cath 1. Dist LAD lesion, 80% stenosed. 2. Mid RCA lesion, 60% stenosed. 3. Ost 2nd Diag to 2nd Diag lesion, 60% stenosed. 4. 2nd Mrg lesion, 30% stenosed. 5. Mid LAD lesion, 25% stenosed. 6. LPDA lesion, 45% stenosed.   Significant apical LAD stenosis, best treated with medication. Moderate disease in the second diagonal and mid nondominant RCA.  Normal left ventricular systolic  function. Normal left ventricular filling pressures.  Post Cath Recommendations:   Medical therapy of both atrial fibrillation and coronary artery disease. Percutaneous intervention is not currently indicated for coronary disease.  03/2018 cath  Dist LAD lesion is 50% stenosed. -Demonstrates regression of disease  Mid RCA (nondominant) stable lesion is 60% stenosed. Stable  Ost LPDA to LPDA lesion is 45% stenosed. Stable  Ost 2nd Diag lesion is 45% stenosed. Demonstrates progression of disease  The left  ventricular systolic function is normal. The left ventricular ejection fraction is 55-65% by visual estimate.  LV end diastolic pressure is mildly elevated.  Angiographically stable to improved coronary disease with notably less significant disease in the diagonal branch as well as the apical LAD.  No potential culprit lesion to explain the patient's symptoms. Mildly elevated LVEDP.  Plan:  Discharge home after TR band removal and bedrest.  Restart Xarelto tonight.  01/2019 event monitor  14 day event monitor  Min HR 54, Max HR 120, Avg HR 76  Sinus rhythm with PACs, with episode of afib rate 120  Reported symptoms correlated with sinus rhythm and PACs   Assessment and Plan:  1. CAD in native artery   2. Thoracic aortic aneurysm without rupture ([place])   3. Essential hypertension   4. Atrial fibrillation and flutter ([facility])   5. Stenosis of carotid artery, unspecified laterality    1. CAD in native artery Patient denies any recent progressive anginal or exertional symptoms.  He is compliant with all his medications.  Continue Imdur 20 mg daily.  Sublingual nitroglycerin 0.4 as needed.  2. Thoracic aortic aneurysm without rupture ([place]) Recent CTA chest aorta on 12/31/2019 showed a mild fusiform aneurysmal dilatation of ascending thoracic aorta measuring 40 mm in diameter unchanged from prior examination in 2015   3. Essential hypertension Blood pressure is  well controlled on current medications.  Continue benazepril 20 mg daily, Lasix 20 mg daily.  4. Atrial fibrillation and flutter ([place]) EKG today shows sinus rhythm with frequent PACs-atrial bigeminy, incomplete left bundle branch block rate of 69.  Continue Xarelto 20 mg daily.  Patient states the increased dose of Toprol-XL to 200 mg a day has been making him feel tired recently.  Advised the patient if he wanted he could try the Toprol 100 mg twice a day to see if this improves the fatigue.  However if palpitations return he would need to go back on the once a day dosing.  Patient verbalizes understanding.  He states he took 1 as needed dose of diltiazem recently for a brief period of palpitations   5.  Carotid artery stenosis Recent carotid artery duplex on 12/13/2019 showed minimal heterogeneous plaque with no hemodynamically significant stenosis by duplex criteria.  Medication Adjustments/Labs and Tests Ordered: Current medicines are reviewed at length with the patient today.  Concerns regarding medicines are outlined above.   Disposition: Follow-up with Lucas [name] or APP 1 year.  Signed, Levell July, NP 02/16/2020 12:22 PM    [facility] [place], [place], [place] [address] Phone: [phone]; Fax: [phone]

## 2020-02-16 NOTE — Patient Instructions (Addendum)

## 2020-04-10 ENCOUNTER — Other Ambulatory Visit: Payer: Self-pay

## 2020-04-10 ENCOUNTER — Emergency Department (HOSPITAL_COMMUNITY): Payer: 59

## 2020-04-10 ENCOUNTER — Encounter (HOSPITAL_COMMUNITY): Payer: Self-pay | Admitting: Emergency Medicine

## 2020-04-10 ENCOUNTER — Emergency Department (HOSPITAL_COMMUNITY)
Admission: EM | Admit: 2020-04-10 | Discharge: 2020-04-11 | Disposition: A | Discharge status: Home / Self Care | Payer: 59 | Attending: Emergency Medicine | Admitting: Emergency Medicine

## 2020-04-10 DIAGNOSIS — I10 Essential (primary) hypertension: Secondary | ICD-10-CM | POA: Insufficient documentation

## 2020-04-10 DIAGNOSIS — I2510 Atherosclerotic heart disease of native coronary artery without angina pectoris: Secondary | ICD-10-CM | POA: Insufficient documentation

## 2020-04-10 DIAGNOSIS — Z7901 Long term (current) use of anticoagulants: Secondary | ICD-10-CM | POA: Diagnosis not present

## 2020-04-10 DIAGNOSIS — I4891 Unspecified atrial fibrillation: Secondary | ICD-10-CM | POA: Diagnosis not present

## 2020-04-10 DIAGNOSIS — R079 Chest pain, unspecified: Secondary | ICD-10-CM

## 2020-04-10 DIAGNOSIS — E119 Type 2 diabetes mellitus without complications: Secondary | ICD-10-CM | POA: Diagnosis not present

## 2020-04-10 DIAGNOSIS — Z79899 Other long term (current) drug therapy: Secondary | ICD-10-CM | POA: Diagnosis not present

## 2020-04-10 LAB — CBC
HCT: 44.9 % (ref 39.0–52.0)
Hemoglobin: 14.9 g/dL (ref 13.0–17.0)
MCH: 30.7 pg (ref 26.0–34.0)
MCHC: 33.2 g/dL (ref 30.0–36.0)
MCV: 92.4 fL (ref 80.0–100.0)
Platelets: 157 10*3/uL (ref 150–400)
RBC: 4.86 MIL/uL (ref 4.22–5.81)
RDW: 14 % (ref 11.5–15.5)
WBC: 8.9 10*3/uL (ref 4.0–10.5)
nRBC: 0 % (ref 0.0–0.2)

## 2020-04-10 LAB — BASIC METABOLIC PANEL
Anion gap: 12 (ref 5–15)
BUN: 13 mg/dL (ref 8–23)
CO2: 22 mmol/L (ref 22–32)
Calcium: 8.7 mg/dL — ABNORMAL LOW (ref 8.9–10.3)
Chloride: 104 mmol/L (ref 98–111)
Creatinine, Ser: 0.96 mg/dL (ref 0.61–1.24)
GFR calc Af Amer: >60 mL/min (ref >60)
GFR calc non Af Amer: >60 mL/min (ref >60)
Glucose, Bld: 125 mg/dL — ABNORMAL HIGH (ref 70–99)
Potassium: 3.5 mmol/L (ref 3.5–5.1)
Sodium: 138 mmol/L (ref 135–145)

## 2020-04-10 LAB — TROPONIN I (HIGH SENSITIVITY)
Troponin I (High Sensitivity): 4 ng/L (ref <18)
Troponin I (High Sensitivity): 4 ng/L (ref <18)

## 2020-04-10 MED ORDER — DILTIAZEM LOAD VIA INFUSION: 10.0000 mg | Freq: Once | INTRAVENOUS | Status: DC

## 2020-04-10 MED ORDER — DILTIAZEM LOAD VIA INFUSION
10.0000 mg | Freq: Once | INTRAVENOUS | Status: AC
  Administered 2020-04-10: 10 mg via INTRAVENOUS
  Filled 2020-04-10: qty 10

## 2020-04-10 MED ORDER — DILTIAZEM HCL-DEXTROSE 125-5 MG/125ML-% IV SOLN (PREMIX)
5.0000 mg/h | INTRAVENOUS | Status: DC
  Administered 2020-04-10: 5 mg/h via INTRAVENOUS
  Filled 2020-04-10: qty 125

## 2020-04-10 MED ORDER — RIVAROXABAN 20 MG PO TABS
20.0000 mg | ORAL_TABLET | Freq: Once | ORAL | Status: AC
  Administered 2020-04-10: 20 mg via ORAL
  Filled 2020-04-10: qty 1

## 2020-04-10 MED ORDER — SODIUM CHLORIDE 0.9 % IV BOLUS
500.0000 mL | Freq: Once | INTRAVENOUS | Status: AC
  Administered 2020-04-10: 500 mL via INTRAVENOUS

## 2020-04-10 MED ORDER — RIVAROXABAN 10 MG PO TABS
ORAL_TABLET | ORAL | Status: AC
  Filled 2020-04-10: qty 2

## 2020-04-10 NOTE — Discharge Instructions (Signed)
You were seen in the emergency department today with return of atrial fibrillation.  We were able to slow down your rate and will have you take your evening dose of metoprolol when you get home.  Continue to take the diltiazem as needed for severe palpitations.  If you develop palpitations that do not resolve with your diltiazem or return of chest pain you should return immediately to the emergency department.  Please call your cardiologist first thing tomorrow morning for a follow-up appointment.

## 2020-04-10 NOTE — ED Provider Notes (Signed)
Emergency Department Provider Note   I have reviewed the triage vital signs and the nursing notes.   HISTORY  Chief Complaint Chest Pain   HPI Lucas [name]. is a 64 y.o. male with past medical history reviewed below including paroxysmal atrial fibrillation and coronary artery disease followed by Dr. Harl Bowie presents with acute onset chest discomfort.  He reports that he has similar feelings in the past when he goes into atrial fibrillation.  He has been compliant with his Xarelto and his metoprolol.  He was leaving work when he developed symptoms and took his 30 mg of p.o. diltiazem which he has to take as needed.  Symptoms continued so he came to the emergency department.  No lightheadedness or syncope.  Denies diaphoresis or vomiting.  No radiation of symptoms or other modifying factors.  Past Medical History:  Diagnosis Date  . Arthritis   . Bilateral carpal tunnel syndrome 08/16/2019  . CAD (coronary artery disease)    a. LHC on 01/29/16 which revealed significant apical LAD stenosis, best treated medically. There was moderate disease in the D2 and mid nondominant RCA.[name] No PCI performed b. 03/2018: repeat cath showing regression of his LAD stenosis now being at 50% with 60% mid RCA stenosis, 45% ostial LPDA stenosis, and 45% ostial second diagonal stenosis.  . Cataract   . Diabetes mellitus    type 2  . GERD (gastroesophageal reflux disease)   . Heart disease   . Hyperlipidemia   . Hypertension   . Obesity   . PAF (paroxysmal atrial fibrillation) ([place]) 01/2016  . Sleep apnea    has CPAP machine but not wear  . Tubular adenoma of colon 08/2012  . Ulnar neuropathy at elbow 08/16/2019   Bilateral    Patient Active Problem List   Diagnosis Date Noted  . Bilateral carpal tunnel syndrome 08/16/2019  . Ulnar neuropathy at elbow 08/16/2019  . Typical atrial flutter ([place])   . Obesity 05/30/2016  . Leukocytosis 05/30/2016  . AKI (acute kidney injury) ([place]) 05/30/2016  .  Diabetes mellitus, type II ([place]) 01/30/2016  . Coronary artery disease involving native coronary artery of native heart with angina pectoris ([place]); Apical LAD 80%, LPDA ~45%. D2 ~60% & non-dominant RCA 80%.   . Hypertension   . Hyperlipidemia   . Sleep apnea   . Atrial fibrillation with rapid ventricular response ([place]) 01/29/2016  . Elevated troponin     Past Surgical History:  Procedure Laterality Date  . ANKLE SURGERY Left 2012   tendon repair  . CARDIAC CATHETERIZATION [name] 01/29/2016   Procedure: Left Heart Cath and Coronary Angiography;  Surgeon: [name], MD;  Location: [facility];  Service: Cardiovascular;  Laterality: [name];  . CARPAL TUNNEL RELEASE Right 09/06/2019   Procedure: CARPAL TUNNEL RELEASE;  Surgeon: Daryll Brod, MD;  Location: [place];  Service: Orthopedics;  Laterality: Right;  . CARPAL TUNNEL RELEASE Left 10/11/2019   Procedure: LEFT CARPAL TUNNEL RELEASE;  Surgeon: Daryll Brod, MD;  Location: [facility];  Service: Orthopedics;  Laterality: Left;  IV REGIONAL FOREARM BLOCK  . CATARACT EXTRACTION W/PHACO  11/11/2012   Procedure: CATARACT EXTRACTION PHACO AND INTRAOCULAR LENS PLACEMENT (IOC);  Surgeon: [name], MD;  Location: [facility];  Service: Ophthalmology;  Laterality: Right;  CDE:  5.56  . CATARACT EXTRACTION W/PHACO Left 12/15/2013   Procedure: CATARACT EXTRACTION PHACO AND INTRAOCULAR LENS PLACEMENT (IOC);  Surgeon: [name], MD;  Location: [facility];  Service:  Ophthalmology;  Laterality: Left;  CDE:13.36  . CHOLECYSTECTOMY    . CHONDROPLASTY Right 05/28/2017   Procedure: RIGHT KNEE CHONDROPLASTY;  Surgeon: [name], MD;  Location: [facility];  Service: Orthopedics;  Laterality: Right;  . COLONOSCOPY    . CYSTOSCOPY W/ URETERAL STENT PLACEMENT     right  . ELECTROPHYSIOLOGIC STUDY [name] 06/02/2016   Procedure: A-Flutter Ablation;  Surgeon: [name], MD;  Location: [place] [facility] [facility];  Service:  Cardiovascular;  Laterality: [name];  . KNEE ARTHROSCOPY WITH MEDIAL MENISECTOMY Right 05/28/2017   Procedure: KNEE ARTHROSCOPY WITH MEDIAL MENISECTOMY WITH EXTENSIVE SYNOVECTOMY;  Surgeon: [name], MD;  Location: [facility];  Service: Orthopedics;  Laterality: Right;  . LEFT HEART CATH AND CORONARY ANGIOGRAPHY [name] 03/30/2018   Procedure: LEFT HEART CATH AND CORONARY ANGIOGRAPHY;  Surgeon: Leonie Man, MD;  Location: [place] [facility] [facility];  Service: Cardiovascular;  Laterality: [name];  . left knee sugery Left    Arthroscopy  . NASAL SINUS SURGERY    . POLYPECTOMY    . TONSILLECTOMY      Allergies Bee venom and Codeine  Family History  Problem Relation Age of Onset  . Heart murmur Mother   . Aneurysm Mother   . Hypertension Mother   . Cirrhosis Father   . Heart attack Sister   . Diabetes Sister   . Aneurysm Maternal Grandmother   . Stroke Maternal Grandmother   . Heart attack Paternal Grandmother   . Stroke Sister   . Colon cancer Neg Hx   . Rectal cancer Neg Hx   . Stomach cancer Neg Hx     Social History Social History   Tobacco Use  . Smoking status: Never Smoker  . Smokeless tobacco: Never Used  Substance Use Topics  . Alcohol use: Yes    Alcohol/week: 0.0 standard drinks    Comment: rare occasion  . Drug use: No    Review of Systems  Constitutional: No fever/chills Eyes: No visual changes. ENT: No sore throat. Cardiovascular: Positive chest pain. Respiratory: Denies shortness of breath. Gastrointestinal: No abdominal pain.  No nausea, no vomiting.  No diarrhea.  No constipation. Genitourinary: Negative for dysuria. Musculoskeletal: Negative for back pain. Skin: Negative for rash. Neurological: Negative for headaches, focal weakness or numbness.  10-point ROS otherwise negative.  ____________________________________________   PHYSICAL EXAM:  VITAL SIGNS: ED Triage Vitals  Enc Vitals Group     BP 04/10/20 2018 (!) 141/105      Pulse Rate 04/10/20 2018 (!) 150     Resp 04/10/20 2018 20     Temp --      Temp src --      SpO2 04/10/20 2018 98 %     Weight 04/10/20 2016 265 lb (120.2 kg)     Height 04/10/20 2016 5\' 10"  (1.778 m)   Constitutional: Alert and oriented. Well appearing and in no acute distress. Eyes: Conjunctivae are normal.  Head: Atraumatic. Nose: No congestion/rhinnorhea. Mouth/Throat: Mucous membranes are moist.  Neck: No stridor.   Cardiovascular: A-fib with RVR. Good peripheral circulation. Grossly normal heart sounds.   Respiratory: Normal respiratory effort.  No retractions. Lungs CTAB. Gastrointestinal: Soft and nontender. No distention.  Musculoskeletal: No lower extremity tenderness nor edema. No gross deformities of extremities. Neurologic:  Normal speech and language.  Skin:  Skin is warm, dry and intact. No rash noted.  ____________________________________________   LABS (all labs ordered are listed, but only abnormal results are displayed)  Labs Reviewed  BASIC METABOLIC PANEL - Abnormal; Notable for the following components:      Result Value   Glucose, Bld 125 (*)    Calcium 8.7 (*)    All other components within normal limits  CBC  TROPONIN I (HIGH SENSITIVITY)  TROPONIN I (HIGH SENSITIVITY)   ____________________________________________  EKG   EKG Interpretation  Date/Time:  Tuesday April 10 2020 20:15:58 EDT Ventricular Rate:  152 PR Interval:    QRS Duration: 80 QT Interval:  298 QTC Calculation: 473 R Axis:   33 Text Interpretation: Atrial fibrillation with rapid ventricular response Low voltage QRS Septal infarct , age undetermined Abnormal ECG No STEMI Confirmed by Nanda Quinton [phone]) on 04/10/2020 8:23:44 PM       ____________________________________________  RADIOLOGY  DG Chest Port 1 View  Result Date: 04/10/2020 CLINICAL DATA:  Chest pain for several hours EXAM: PORTABLE CHEST 1 VIEW COMPARISON:  08/22/2017, CT from 12/13/2019 FINDINGS: Cardiac  shadow is within normal limits. Linear scarring is noted in the left upper lobe along the fissure stable in appearance from the prior exam. No focal infiltrate or sizable effusion is seen. No acute bony abnormality is noted. IMPRESSION: Chronic scarring in the left upper lobe. No acute abnormality noted. Electronically Signed   By: Inez Catalina M.D.   On: 04/10/2020 20:51    ____________________________________________   PROCEDURES  Procedure(s) performed:   Procedures  CRITICAL CARE Performed by: Margette Fast Total critical care time: 35 minutes Critical care time was exclusive of separately billable procedures and treating other patients. Critical care was necessary to treat or prevent imminent or life-threatening deterioration. Critical care was time spent personally by me on the following activities: development of treatment plan with patient and/or surrogate as well as nursing, discussions with consultants, evaluation of patient's response to treatment, examination of patient, obtaining history from patient or surrogate, ordering and performing treatments and interventions, ordering and review of laboratory studies, ordering and review of radiographic studies, pulse oximetry and re-evaluation of patient's condition.  Nanda Quinton, MD Emergency Medicine  ____________________________________________   INITIAL IMPRESSION / ASSESSMENT AND PLAN / ED COURSE  Pertinent labs & imaging results that were available during my care of the patient were reviewed by me and considered in my medical decision making (see chart for details).   Patient presents emergency department with chest pain and found to be in atrial fibrillation with RVR.  He does have CAD history and so ACS must be considered as well.  He is compliant with his Xarelto and would be a good candidate for cardioversion but as I am assessing the bedside he is fluctuating between runs of sinus rhythm and then back into A. fib with  RVR.  Discussed the case with Dr. Kalman Shan on-call for cardiology.  With his jumping in and out of A. fib would not be a great cardioversion candidate at this time.  May ultimately need rhythm intervention as outpatient.   Started the patient on diltiazem infusion and rate improved with minimal diltiazem.  If patient remains rate controlled and delta troponin is unchanged could consider discharge with close cardiology follow-up as an outpatient.   Patient maintaining rate control off drip in the ED. Will take his Metoprolol when he returns home. Plans to call Cardiology team in the AM for f/u appointment. Delta troponin negative.    I reviewed all nursing notes, vitals, pertinent old records, EKGs, labs, imaging (as available).  ____________________________________________  FINAL CLINICAL IMPRESSION(S) / ED DIAGNOSES  Final diagnoses:  Atrial fibrillation with RVR ([place])     MEDICATIONS GIVEN DURING THIS VISIT:  Medications  sodium chloride 0.9 % bolus 500 mL (0 mLs Intravenous Stopped 04/10/20 2246)  rivaroxaban (XARELTO) tablet 20 mg (20 mg Oral Given 04/10/20 2152)  diltiazem (CARDIZEM) 1 mg/mL load via infusion 10 mg (10 mg Intravenous Bolus from Bag 04/10/20 2102)     Note:  This document was prepared using Dragon voice recognition software and may include unintentional dictation errors.  Nanda Quinton, MD, [facility] Emergency Medicine    Zyann Mabry, [facility], MD 04/11/20 [phone]

## 2020-04-10 NOTE — ED Triage Notes (Signed)
Pt states he was at work around [place] and his chest started hurting. Pt states "I have a history of a-fib and i think i may be in a-fib again."

## 2020-04-12 ENCOUNTER — Ambulatory Visit: Payer: 59 | Admitting: Cardiology

## 2020-04-12 ENCOUNTER — Other Ambulatory Visit: Payer: Self-pay

## 2020-04-12 ENCOUNTER — Encounter: Payer: Self-pay | Admitting: Cardiology

## 2020-04-12 VITALS — BP 118/72 | HR 65 | Ht 70.0 in | Wt 270.2 lb

## 2020-04-12 DIAGNOSIS — I4891 Unspecified atrial fibrillation: Secondary | ICD-10-CM

## 2020-04-12 DIAGNOSIS — I480 Paroxysmal atrial fibrillation: Secondary | ICD-10-CM | POA: Diagnosis not present

## 2020-04-12 MED ORDER — DILTIAZEM HCL 30 MG PO TABS: 30.0000 mg | ORAL_TABLET | Freq: Two times a day (BID) | ORAL | 3 refills | Status: DC

## 2020-04-12 NOTE — Patient Instructions (Signed)
Medication Instructions:   Diltiazem 30mg  twice a day - may use 1 - 2 tabs extra as needed for palpitations.Robet Leu  Continue all other medications.    Labwork: none  Testing/Procedures: none  Follow-Up: 2 months   Any Other Special Instructions Will Be Listed Below (If Applicable). Dr. Curt Bears - follow up for Atrial Fibrillation   If you need a refill on your cardiac medications before your next appointment, please call your pharmacy.

## 2020-04-12 NOTE — Progress Notes (Signed)
Clinical Summary Lucas [name] is a 64 y.o.male seen today for follow up of the following medical problems.This is a focused visit on recent ER visit with afib with RVR.        1. Aflutter/Afib - CHADS2Vasc score of 2, on xarelto - s/p aflutter ablation 05/2016 by Dr Curt Bears. 01/2019 event monitor: short episode of afib, SR with PACs - toprol was increased to 150mg  daily    - rare symptoms, compliatn with meds.  - no recent bleeding on xarelto.  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 Has a 66 yo [place]     Past Medical History:  Diagnosis Date  . Arthritis   . Bilateral carpal tunnel syndrome 08/16/2019  . CAD (coronary artery disease)    a. LHC on 01/29/16 which revealed significant apical LAD stenosis, best treated medically. There was moderate disease in the D2 and mid nondominant RCA.[name] No PCI performed b. 03/2018: repeat cath showing regression of his LAD stenosis now being at 50% with 60% mid RCA stenosis, 45% ostial LPDA stenosis, and 45% ostial second diagonal stenosis.  . Cataract   . Diabetes mellitus    type 2  . GERD (gastroesophageal reflux disease)   . Heart disease   . Hyperlipidemia   . Hypertension   . Obesity   . PAF (paroxysmal atrial fibrillation) ([place]) 01/2016  . Sleep apnea    has CPAP machine but not wear  . Tubular adenoma of colon 08/2012  . Ulnar neuropathy at elbow 08/16/2019   Bilateral     Allergies  Allergen Reactions  . Bee Venom Swelling  . Codeine Nausea Only     Current Outpatient Medications  Medication Sig Dispense Refill  . acetaminophen (TYLENOL) 325 MG tablet Take 650 mg by mouth every 6 (six) hours as needed for moderate pain or headache.     [name]  atorvastatin (LIPITOR) 80 MG tablet TAKE 1 TABLET (80 MG TOTAL) BY MOUTH DAILY AT 6 PM. 90 tablet 3  . benazepril (LOTENSIN) 20 MG tablet Take 20 mg by mouth in the morning.     . diltiazem (CARDIZEM) 30 MG tablet Take 1 tab 2 times daily as needed for palpitations (Patient taking differently: Take 30 mg by mouth 2 (two) times daily as needed (for palpitations). ) 180 tablet 3  . furosemide (LASIX) 20 MG tablet Take 20 mg by mouth daily as needed for fluid.     [name] glimepiride (AMARYL) 4 MG tablet Take 4 mg by mouth 2 (two) times daily.     [name] HYDROcodone-acetaminophen (NORCO) 5-325 MG tablet Take 1 tablet by mouth every 6 (six) hours as needed. (Patient taking differently: Take 1 tablet by mouth every 6 (six) hours as needed for moderate pain. ) 20 tablet 0  . isosorbide mononitrate (IMDUR) 120 MG 24 hr tablet Take 1 tablet (120 mg total) by mouth daily. (Patient taking differently: Take 120 mg by mouth in the morning. ) 90 tablet 3  . metoprolol succinate (TOPROL-XL) 100 MG 24 hr tablet Take 100 mg by mouth in the morning and at bedtime.     . nitroGLYCERIN (NITROSTAT) 0.4 MG SL tablet Place 1  tablet (0.4 mg total) under the tongue every 5 (five) minutes as needed for chest pain. 25 tablet 3  . OZEMPIC, 0.25 OR 0.5 MG/DOSE, 2 MG/1.5ML SOPN Inject 0.5 mg into the skin every 7 (seven) days.    . pantoprazole (PROTONIX) 40 MG tablet Take 40 mg by mouth in the morning.     [name] SYNJARDY XR 12.03-999 MG TB24 Take 1 tablet by mouth in the morning and at bedtime.    [name] 20 MG TABS tablet TAKE 1 TABLET (20 MG TOTAL) BY MOUTH DAILY WITH SUPPER. (Patient taking differently: Take 20 mg by mouth daily with supper. ) 90 tablet 3   No current facility-administered medications for this visit.     Past Surgical History:  Procedure Laterality Date  . ANKLE SURGERY Left 2012   tendon repair  . CARDIAC CATHETERIZATION [name] 01/29/2016   Procedure: Left Heart Cath and Coronary Angiography;  Surgeon: [name],  MD;  Location: [place] [facility];  Service: Cardiovascular;  Laterality: [name];  . CARPAL TUNNEL RELEASE Right 09/06/2019   Procedure: CARPAL TUNNEL RELEASE;  Surgeon: Daryll Brod, MD;  Location: [facility];  Service: Orthopedics;  Laterality: Right;  . CARPAL TUNNEL RELEASE Left 10/11/2019   Procedure: LEFT CARPAL TUNNEL RELEASE;  Surgeon: Daryll Brod, MD;  Location: [place];  Service: Orthopedics;  Laterality: Left;  IV REGIONAL FOREARM BLOCK  . CATARACT EXTRACTION W/PHACO  11/11/2012   Procedure: CATARACT EXTRACTION PHACO AND INTRAOCULAR LENS PLACEMENT (IOC);  Surgeon: Tonny Raizy Auzenne, MD;  [facility];  Service: Ophthalmology;  Laterality: Right;  CDE:  5.56  . CATARACT EXTRACTION W/PHACO Left 12/15/2013   Procedure: CATARACT EXTRACTION PHACO AND INTRAOCULAR LENS PLACEMENT (IOC);  Surgeon: Tonny Staceyann Knouff, MD;  [facility];  Service: Ophthalmology;  Laterality: Left;  CDE:13.36  . CHOLECYSTECTOMY    . CHONDROPLASTY Right 05/28/2017   Procedure: RIGHT KNEE CHONDROPLASTY;  Surgeon: [name], MD;  Location: [facility];  Service: Orthopedics;  Laterality: Right;  . COLONOSCOPY    . CYSTOSCOPY W/ URETERAL STENT PLACEMENT     right  . ELECTROPHYSIOLOGIC STUDY [name] 06/02/2016   Procedure: A-Flutter Ablation;  Surgeon: [name], MD;  Location: [facility] [place] [facility];  Service: Cardiovascular;  Laterality: [name];  . KNEE ARTHROSCOPY WITH MEDIAL MENISECTOMY Right 05/28/2017   Procedure: KNEE ARTHROSCOPY WITH MEDIAL MENISECTOMY WITH EXTENSIVE SYNOVECTOMY;  Surgeon: [name], MD;  Location: [place];  Service: Orthopedics;  Laterality: Right;  . LEFT HEART CATH AND CORONARY ANGIOGRAPHY [name] 03/30/2018   Procedure: LEFT HEART CATH AND CORONARY ANGIOGRAPHY;  Surgeon: Leonie Man, MD;  Location: [facility] [facility];  Service: Cardiovascular;  Laterality: [name];  . left knee sugery Left    Arthroscopy  . NASAL SINUS SURGERY    .  POLYPECTOMY    . TONSILLECTOMY       Allergies  Allergen Reactions  . Bee Venom Swelling  . Codeine Nausea Only      Family History  Problem Relation Age of Onset  . Heart murmur Mother   . Aneurysm Mother   . Hypertension Mother   . Cirrhosis Father   . Heart attack Sister   . Diabetes Sister   . Aneurysm Maternal Grandmother   . Stroke Maternal Grandmother   . Heart attack Paternal Grandmother   . Stroke Sister   . Colon cancer Neg Hx   . Rectal cancer Neg Hx   .  Stomach cancer Neg Hx      Social History Lucas [name] reports that he has never smoked. He has never used smokeless tobacco. Lucas [name] reports current alcohol use.   Review of Systems CONSTITUTIONAL: No weight loss, fever, chills, weakness or fatigue.  HEENT: Eyes: No visual loss, blurred vision, double vision or yellow sclerae.No hearing loss, sneezing, congestion, runny nose or sore throat.  SKIN: No rash or itching.  CARDIOVASCULAR: per hpi RESPIRATORY: No shortness of breath, cough or sputum.  GASTROINTESTINAL: No anorexia, nausea, vomiting or diarrhea. No abdominal pain or blood.  GENITOURINARY: No burning on urination, no polyuria NEUROLOGICAL: No headache, dizziness, syncope, paralysis, ataxia, numbness or tingling in the extremities. No change in bowel or bladder control.  MUSCULOSKELETAL: No muscle, back pain, joint pain or stiffness.  LYMPHATICS: No enlarged nodes. No history of splenectomy.  PSYCHIATRIC: No history of depression or anxiety.  ENDOCRINOLOGIC: No reports of sweating, cold or heat intolerance. No polyuria or polydipsia.  [name]   Physical Examination Today's Vitals   04/12/20 1149  BP: 118/72  Pulse: 65  SpO2: 95%  Weight: 270 lb 3.2 oz (122.6 kg)  Height: 5\' 10"  (1.778 m)   Body mass index is 38.77 kg/m.  Gen: resting comfortably, no acute distress HEENT: no scleral icterus, pupils equal round and reactive, no palptable cervical adenopathy,  CV: RRR, no m/r,g no jvd Resp:  Clear to auscultation bilaterally GI: abdomen is soft, non-tender, non-distended, normal bowel sounds, no hepatosplenomegaly MSK: extremities are warm, no edema.  Skin: warm, no rash Neuro:  no focal deficits Psych: appropriate affect   Diagnostic Studies 01/2016 echo Study Conclusions  - Left ventricle: The cavity size was mildly dilated. Wall  thickness was increased in a pattern of mild LVH. Systolic  function was normal. The estimated ejection fraction was in the  range of 55% to 60%.  01/2016 cath 1. Dist LAD lesion, 80% stenosed. 2. Mid RCA lesion, 60% stenosed. 3. Ost 2nd Diag to 2nd Diag lesion, 60% stenosed. 4. 2nd Mrg lesion, 30% stenosed. 5. Mid LAD lesion, 25% stenosed. 6. LPDA lesion, 45% stenosed.   Significant apical LAD stenosis, best treated with medication. Moderate disease in the second diagonal and mid nondominant RCA.  Normal left ventricular systolic function. Normal left ventricular filling pressures.  Post Cath Recommendations:   Medical therapy of both atrial fibrillation and coronary artery disease. Percutaneous intervention is not currently indicated for coronary disease.  03/2018 cath  Dist LAD lesion is 50% stenosed. -Demonstrates regression of disease  Mid RCA (nondominant) stable lesion is 60% stenosed. Stable  Ost LPDA to LPDA lesion is 45% stenosed. Stable  Ost 2nd Diag lesion is 45% stenosed. Demonstrates progression of disease  The left ventricular systolic function is normal. The left ventricular ejection fraction is 55-65% by visual estimate.  LV end diastolic pressure is mildly elevated.  Angiographically stable to improved coronary disease with notably less significant disease in the diagonal Doneta Bayman as well as the apical LAD.  No potential culprit lesion to explain the patient's symptoms. Mildly elevated LVEDP.  Plan:  Discharge home after TR band removal and bedrest.  Restart Xarelto tonight.  01/2019 event  monitor  14 day event monitor  Min HR 54, Max HR 120, Avg HR 76  Sinus rhythm with PACs, with episode of afib rate 120  Reported symptoms correlated with sinus rhythm and PACs    Assessment and Plan    1. Aflutter/Afib - progressing palpitations, including recent ER visit showing afib  with RVR - continue toprol 100mg  bid (bid dosing tolerated better in regards to fatigue) - change his prn diltiazem to 30mg  bid scheduled, may take additional prn doses as needed - titrate dilt up if neccesary - will have him reestablish with EP, potential he may need consideration for antiarrhythmic in the near future. Options limited by his CAD history. - EKG today shows SR, frequent PACs     Arnoldo Lenis, M.D..

## 2020-04-18 ENCOUNTER — Other Ambulatory Visit: Payer: Self-pay | Admitting: *Deleted

## 2020-04-18 DIAGNOSIS — I4891 Unspecified atrial fibrillation: Secondary | ICD-10-CM

## 2020-04-27 ENCOUNTER — Other Ambulatory Visit: Payer: Self-pay | Admitting: Cardiology

## 2020-05-03 ENCOUNTER — Telehealth: Payer: Self-pay | Admitting: Cardiology

## 2020-05-03 NOTE — Telephone Encounter (Signed)
Patients wife called 4:25pm trying to speak to someone about her husbands condition. He went into AFIB on 04/28/20  And no medication is helping or holding him. She said that only last night did his AFIB start to slow down about 1am. Can someone give her a call back to help them figure out next steps for this? Has appt to see Dr Curt Bears on 7/29 but she was wondering if they can be seen any sooner.

## 2020-05-03 NOTE — Telephone Encounter (Signed)
Pt scheduled with afib clinic Monday at [place] - spoke with wife and confirmed appt gave phone # to afib clinic and gate code     Pt wife says HR went up to 150-160 for about an hour last night pt was SOB/weak - took 2 additional tablets of diltiazem 30mg  when this happened and did not help until about an hour later (this was in addition to diltiazem 30 bid earlier yesterday) pt is at work now hasn't had any further symptoms or episodes - c/o being weak/tired - has EP appt 7/29 - will forward to provider to see if we should get pt appt in afib clinic sooner

## 2020-05-04 NOTE — Telephone Encounter (Signed)
Mrs. Ator called requesting to speak with Staci.  Cell # [phone] Shirlean Mylar)

## 2020-05-07 ENCOUNTER — Other Ambulatory Visit: Payer: Self-pay

## 2020-05-07 ENCOUNTER — Ambulatory Visit (HOSPITAL_COMMUNITY)
Admission: RE | Admit: 2020-05-07 | Discharge: 2020-05-07 | Disposition: A | Discharge status: Home / Self Care | Payer: 59 | Source: Ambulatory Visit | Attending: Nurse Practitioner | Admitting: Nurse Practitioner

## 2020-05-07 VITALS — BP 108/60 | HR 65 | Ht 70.0 in | Wt 265.0 lb

## 2020-05-07 DIAGNOSIS — I483 Typical atrial flutter: Secondary | ICD-10-CM | POA: Diagnosis not present

## 2020-05-07 DIAGNOSIS — Z885 Allergy status to narcotic agent status: Secondary | ICD-10-CM | POA: Diagnosis not present

## 2020-05-07 DIAGNOSIS — D6869 Other thrombophilia: Secondary | ICD-10-CM

## 2020-05-07 DIAGNOSIS — I2510 Atherosclerotic heart disease of native coronary artery without angina pectoris: Secondary | ICD-10-CM | POA: Insufficient documentation

## 2020-05-07 DIAGNOSIS — M1990 Unspecified osteoarthritis, unspecified site: Secondary | ICD-10-CM | POA: Insufficient documentation

## 2020-05-07 DIAGNOSIS — K219 Gastro-esophageal reflux disease without esophagitis: Secondary | ICD-10-CM | POA: Diagnosis not present

## 2020-05-07 DIAGNOSIS — I480 Paroxysmal atrial fibrillation: Secondary | ICD-10-CM

## 2020-05-07 DIAGNOSIS — E119 Type 2 diabetes mellitus without complications: Secondary | ICD-10-CM | POA: Insufficient documentation

## 2020-05-07 DIAGNOSIS — I10 Essential (primary) hypertension: Secondary | ICD-10-CM | POA: Diagnosis not present

## 2020-05-07 DIAGNOSIS — E669 Obesity, unspecified: Secondary | ICD-10-CM | POA: Diagnosis not present

## 2020-05-07 DIAGNOSIS — Z7901 Long term (current) use of anticoagulants: Secondary | ICD-10-CM | POA: Diagnosis not present

## 2020-05-07 DIAGNOSIS — G4730 Sleep apnea, unspecified: Secondary | ICD-10-CM | POA: Insufficient documentation

## 2020-05-07 DIAGNOSIS — I4891 Unspecified atrial fibrillation: Secondary | ICD-10-CM | POA: Diagnosis not present

## 2020-05-07 DIAGNOSIS — E785 Hyperlipidemia, unspecified: Secondary | ICD-10-CM | POA: Insufficient documentation

## 2020-05-07 DIAGNOSIS — G5603 Carpal tunnel syndrome, bilateral upper limbs: Secondary | ICD-10-CM | POA: Diagnosis not present

## 2020-05-07 DIAGNOSIS — Z79899 Other long term (current) drug therapy: Secondary | ICD-10-CM | POA: Insufficient documentation

## 2020-05-07 NOTE — Progress Notes (Addendum)
Primary Care Physician: Glenda Chroman, MD Referring Physician: Dr. Mickeal Skinner. is a 64 y.o. male with a h/o CAD, HTN, DM,  typical atrial  flutter ablation in 2017, paroxysmal afib that has had 2 episodes of afib with RVR with  chest pain that occurs with afib, since last week. Lucas [name] He was treated in the ER 6/1 with chest pain and was in and out of afib so no changes were made. He is on metoprolol 100 mg bid and xarelto for a CHA2DS2VASc score of 4.He is now here to discuss increase in afib episodes. He also states that he has noted an increase of shortness of breath and intermittent chest tightness since February, when he returned to work, after carpel wrist surgery.He has to wear a mask at work and was thinking it was from  mask wear. However, he has to walk up a small incline on his way to work and has to slow down for the shortness of breath and mild chest discomfort, and does not have the mask on at that point. He does have untreated sleep  apnea. No alcohol, excessive caffeine or tobacco use. He is pending an appointment with Dr. Curt Bears in July. Last echo in 2017 and last cath in 2019,   Today, he denies symptoms of palpitations, chest pain, shortness of breath, orthopnea, PND, lower extremity edema, dizziness, presyncope, syncope, or neurologic sequela. The patient is tolerating medications without difficulties and is otherwise without complaint today.   Past Medical History:  Diagnosis Date  . Arthritis   . Bilateral carpal tunnel syndrome 08/16/2019  . CAD (coronary artery disease)    a. LHC on 01/29/16 which revealed significant apical LAD stenosis, best treated medically. There was moderate disease in the D2 and mid nondominant RCA.Lucas [name] No PCI performed b. 03/2018: repeat cath showing regression of his LAD stenosis now being at 50% with 60% mid RCA stenosis, 45% ostial LPDA stenosis, and 45% ostial second diagonal stenosis.  . Cataract   . Diabetes mellitus    type 2  . GERD  (gastroesophageal reflux disease)   . Heart disease   . Hyperlipidemia   . Hypertension   . Obesity   . PAF (paroxysmal atrial fibrillation) ([place]) 01/2016  . Sleep apnea    has CPAP machine but not wear  . Tubular adenoma of colon 08/2012  . 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 ANKLE SURGERY Left 2012   tendon repair  . CARDIAC CATHETERIZATION [name] 01/29/2016   Procedure: Left Heart Cath and Coronary Angiography;  Surgeon: [name], MD;  Location: [facility];  Service: Cardiovascular;  Laterality: [name];  . CARPAL TUNNEL RELEASE Right 09/06/2019   Procedure: CARPAL TUNNEL RELEASE;  Surgeon: Daryll Brod, MD;  Location: [facility];  Service: Orthopedics;  Laterality: Right;  . CARPAL TUNNEL RELEASE Left 10/11/2019   Procedure: LEFT CARPAL TUNNEL RELEASE;  Surgeon: Daryll Brod, MD;  Location: [facility];  Service: Orthopedics;  Laterality: Left;  IV REGIONAL FOREARM BLOCK  . CATARACT EXTRACTION W/PHACO  11/11/2012   Procedure: CATARACT EXTRACTION PHACO AND INTRAOCULAR LENS PLACEMENT (IOC);  Surgeon: [name], MD;  Location: [facility];  Service: Ophthalmology;  Laterality: Right;  CDE:  5.56  . CATARACT EXTRACTION W/PHACO Left 12/15/2013   Procedure: CATARACT EXTRACTION PHACO AND INTRAOCULAR LENS PLACEMENT (IOC);  Surgeon: [name], MD;  Location: [facility];  Service: Ophthalmology;  Laterality: Left;  CDE:13.36  . CHOLECYSTECTOMY    . CHONDROPLASTY Right 05/28/2017   Procedure: RIGHT KNEE CHONDROPLASTY;  Surgeon: [name], MD;  Location: [facility];  Service: Orthopedics;  Laterality: Right;  . COLONOSCOPY    . CYSTOSCOPY W/ URETERAL STENT PLACEMENT     right  . ELECTROPHYSIOLOGIC STUDY [name] 06/02/2016   Procedure: A-Flutter Ablation;  Surgeon: [name], MD;  Location: [facility];  Service: Cardiovascular;  Laterality: [name];  . KNEE ARTHROSCOPY WITH  MEDIAL MENISECTOMY Right 05/28/2017   Procedure: KNEE ARTHROSCOPY WITH MEDIAL MENISECTOMY WITH EXTENSIVE SYNOVECTOMY;  Surgeon: [name], MD;  Location: [facility];  Service: Orthopedics;  Laterality: Right;  . LEFT HEART CATH AND CORONARY ANGIOGRAPHY [name] 03/30/2018   Procedure: LEFT HEART CATH AND CORONARY ANGIOGRAPHY;  Surgeon: Leonie Man, MD;  Location: [facility];  Service: Cardiovascular;  Laterality: [name];  . left knee sugery Left    Arthroscopy  . NASAL SINUS SURGERY    . POLYPECTOMY    . TONSILLECTOMY      Current Outpatient Medications  Medication Sig Dispense Refill  . ACCU-CHEK GUIDE test strip     . acetaminophen (TYLENOL) 325 MG tablet Take 650 mg by mouth every 6 (six) hours as needed for moderate pain or headache.     Lucas [name] atorvastatin (LIPITOR) 80 MG tablet TAKE 1 TABLET (80 MG TOTAL) BY MOUTH DAILY AT 6 PM. 90 tablet 3  . benazepril (LOTENSIN) 20 MG tablet Take 20 mg by mouth in the morning.     . diltiazem (CARDIZEM) 30 MG tablet Take 1 tablet (30 mg total) by mouth 2 (two) times daily. May take 1 - 2 tabs extra as needed for palpitations 210 tablet 3  . furosemide (LASIX) 20 MG tablet Take 20 mg by mouth daily as needed for fluid.     Lucas [name] glimepiride (AMARYL) 4 MG tablet Take 4 mg by mouth 2 (two) times daily.     Lucas [name] HYDROcodone-acetaminophen (NORCO) 5-325 MG tablet Take 1 tablet by mouth every 6 (six) hours as needed. (Patient taking differently: Take 1 tablet by mouth every 6 (six) hours as needed for moderate pain. ) 20 tablet 0  . isosorbide mononitrate (IMDUR) 120 MG 24 hr tablet Take 1 tablet (120 mg total) by mouth daily. (Patient taking differently: Take 120 mg by mouth in the morning. ) 90 tablet 3  . metoprolol succinate (TOPROL-XL) 100 MG 24 hr tablet Take 100 mg by mouth in the morning and at bedtime.     . nitroGLYCERIN (NITROSTAT) 0.4 MG SL tablet Place 1 tablet (0.4 mg total) under the tongue every 5 (five) minutes as needed for  chest pain. 25 tablet 3  . OZEMPIC, 0.25 OR 0.5 MG/DOSE, 2 MG/1.5ML SOPN Inject 0.5 mg into the skin every 7 (seven) days.    . pantoprazole (PROTONIX) 40 MG tablet Take 40 mg by mouth in the morning.     Lucas [name] SYNJARDY XR 12.03-999 MG TB24 Take 1 tablet by mouth in the morning and at bedtime.    [name] 20 MG TABS tablet TAKE 1 TABLET (20 MG TOTAL) BY MOUTH DAILY WITH SUPPER. (Patient taking differently: Take 20 mg by mouth daily with supper. ) 90 tablet 3   No current facility-administered medications for this encounter.    Allergies  Allergen Reactions  . Bee Venom Swelling  . Codeine Nausea Only    Social  History   Socioeconomic History  . Marital status: Married    Spouse name: Not on file  . Number of children: Not on file  . Years of education: 68  . Highest education level: Not on file  Occupational History  . Not on file  Tobacco Use  . Smoking status: Never Smoker  . Smokeless tobacco: Never Used  Vaping Use  . Vaping Use: Never used  Substance and Sexual Activity  . Alcohol use: Yes    Alcohol/week: 0.0 standard drinks    Comment: rare occasion  . Drug use: No  . Sexual activity: Yes    Birth control/protection: None  Other Topics Concern  . Not on file  Social History Narrative   Right handed   Caffeine~ 1-2 cups per day   Lives at home with wife Shirlean Mylar    Social Determinants of Health   Financial Resource Strain:   . Difficulty of Paying Living Expenses:   Food Insecurity:   . Worried About Charity fundraiser in the Last Year:   . Arboriculturist in the Last Year:   Transportation Needs:   . Film/video editor (Medical):   Lucas [name] Lack of Transportation (Non-Medical):   Physical Activity:   . Days of Exercise per Week:   . Minutes of Exercise per Session:   Stress:   . Feeling of Stress :   Social Connections:   . Frequency of Communication with Friends and Family:   . Frequency of Social Gatherings with Friends and Family:   . Attends Religious  Services:   . Active Member of Clubs or Organizations:   . Attends Archivist Meetings:   Lucas [name] Marital Status:   Intimate Partner Violence:   . Fear of Current or Ex-Partner:   . Emotionally Abused:   Lucas [name] Physically Abused:   . Sexually Abused:     Family History  Problem Relation Age of Onset  . Heart murmur Mother   . Aneurysm Mother   . Hypertension Mother   . Cirrhosis Father   . Heart attack Sister   . Diabetes Sister   . Aneurysm Maternal Grandmother   . Stroke Maternal Grandmother   . Heart attack Paternal Grandmother   . Stroke Sister   . Colon cancer Neg Hx   . Rectal cancer Neg Hx   . Stomach cancer Neg Hx     ROS- All systems are reviewed and negative except as per the HPI above  Physical Exam: There were no vitals filed for this visit. Wt Readings from Last 3 Encounters:  04/12/20 122.6 kg  04/10/20 120.2 kg  02/16/20 122.7 kg    Labs: Lab Results  Component Value Date   NA 138 04/10/2020   K 3.5 04/10/2020   CL 104 04/10/2020   CO2 22 04/10/2020   GLUCOSE 125 (H) 04/10/2020   BUN 13 04/10/2020   CREATININE 0.96 04/10/2020   CALCIUM 8.7 (L) 04/10/2020   MG 1.9 06/02/2016   Lab Results  Component Value Date   INR 1.17 01/29/2016   Lab Results  Component Value Date   CHOL 195 01/29/2016   HDL 39 (L) 01/29/2016   LDLCALC 129 (H) 01/29/2016   TRIG 133 01/29/2016     GEN- The patient is well appearing, alert and oriented x 3 today.   Head- normocephalic, atraumatic Eyes-  Sclera clear, conjunctiva pink Ears- hearing intact Oropharynx- clear Neck- supple, no JVP Lymph- no cervical lymphadenopathy Lungs- Clear to ausculation bilaterally,  normal work of breathing Heart- Irregular( PAC's)  rate and rhythm, no murmurs, rubs or gallops, PMI not laterally displaced GI- soft, NT, ND, + BS Extremities- no clubbing, cyanosis, or edema MS- no significant deformity or atrophy Skin- no rash or lesion Psych- euthymic mood, full affect Neuro-  strength and sensation are intact  EKG-Sinus rhythm with PAC's at 65 bpm, PR int 142 ms, qrs int 76 ms, qtc 422 ms   Cardiac cath- 5/32019-Dist LAD lesion is 50% stenosed. -Demonstrates regression of disease  Mid RCA (nondominant) stable lesion is 60% stenosed. Stable  Ost LPDA to LPDA lesion is 45% stenosed. Stable  Ost 2nd Diag lesion is 45% stenosed. Demonstrates progression of disease  The left ventricular systolic function is normal. The left ventricular ejection fraction is 55-65% by visual estimate.  LV end diastolic pressure is mildly elevated.   Angiographically stable to improved coronary disease with notably less significant disease in the diagonal branch as well as the apical LAD.  No potential culprit lesion to explain the patient's symptoms. Mildly elevated LVEDP.    Assessment and Plan: 1. Paroxsymal afib  Recent up tick in afib episodes associated with chest pain Usually will go back into rhythm with 30 mg Cardizem dose within one hour   On max dose of BB Untreated sleep apnea Pt encouraged to use mask  Discussed antiarrythmic's, 1c agent are not a good option with CAD, if LV function is normal by echo, scheduled today for Friday, can possibly use this, if Multaq not an good option then tikosyn is the best drug to use. Dr. Curt Bears may consider front line ablation as well.  2. CAD I am concerned that  pt's c/o of increased shortness of breath and intermittent chest tightness since February may be a progression of his CAD I will forward my note to Dr. Harl Bowie to make aware in case he feels either repeat cath or other  studies need to be done to assess coronary arteries   3. CHA2DS2VASc score of 4  Continue xarelto 20 mg daily   Will wait to hear from echo and Dr. Harl Bowie to determine next steps   Butch Penny C. Anjelique Makar, [facility] [address] [place], [place] [id number] [phone]

## 2020-05-09 ENCOUNTER — Observation Stay (HOSPITAL_BASED_OUTPATIENT_CLINIC_OR_DEPARTMENT_OTHER): Payer: 59

## 2020-05-09 ENCOUNTER — Other Ambulatory Visit: Payer: Self-pay

## 2020-05-09 ENCOUNTER — Encounter (HOSPITAL_COMMUNITY): Payer: Self-pay

## 2020-05-09 ENCOUNTER — Emergency Department (HOSPITAL_COMMUNITY): Payer: 59

## 2020-05-09 ENCOUNTER — Other Ambulatory Visit (HOSPITAL_COMMUNITY): Payer: Self-pay | Admitting: *Deleted

## 2020-05-09 ENCOUNTER — Observation Stay (HOSPITAL_COMMUNITY)
Admission: EM | Admit: 2020-05-09 | Discharge: 2020-05-10 | Disposition: A | Discharge status: Home / Self Care | Payer: 59 | Attending: Internal Medicine | Admitting: Internal Medicine

## 2020-05-09 DIAGNOSIS — Z79891 Long term (current) use of opiate analgesic: Secondary | ICD-10-CM | POA: Diagnosis not present

## 2020-05-09 DIAGNOSIS — Z833 Family history of diabetes mellitus: Secondary | ICD-10-CM | POA: Insufficient documentation

## 2020-05-09 DIAGNOSIS — Z7984 Long term (current) use of oral hypoglycemic drugs: Secondary | ICD-10-CM | POA: Diagnosis not present

## 2020-05-09 DIAGNOSIS — E1165 Type 2 diabetes mellitus with hyperglycemia: Secondary | ICD-10-CM | POA: Insufficient documentation

## 2020-05-09 DIAGNOSIS — I4891 Unspecified atrial fibrillation: Secondary | ICD-10-CM | POA: Diagnosis present

## 2020-05-09 DIAGNOSIS — R0789 Other chest pain: Secondary | ICD-10-CM

## 2020-05-09 DIAGNOSIS — R079 Chest pain, unspecified: Secondary | ICD-10-CM | POA: Diagnosis not present

## 2020-05-09 DIAGNOSIS — M1990 Unspecified osteoarthritis, unspecified site: Secondary | ICD-10-CM | POA: Insufficient documentation

## 2020-05-09 DIAGNOSIS — I483 Typical atrial flutter: Secondary | ICD-10-CM | POA: Insufficient documentation

## 2020-05-09 DIAGNOSIS — I119 Hypertensive heart disease without heart failure: Secondary | ICD-10-CM | POA: Diagnosis not present

## 2020-05-09 DIAGNOSIS — E785 Hyperlipidemia, unspecified: Secondary | ICD-10-CM | POA: Diagnosis not present

## 2020-05-09 DIAGNOSIS — Z20822 Contact with and (suspected) exposure to covid-19: Secondary | ICD-10-CM | POA: Insufficient documentation

## 2020-05-09 DIAGNOSIS — I25119 Atherosclerotic heart disease of native coronary artery with unspecified angina pectoris: Secondary | ICD-10-CM | POA: Insufficient documentation

## 2020-05-09 DIAGNOSIS — G4733 Obstructive sleep apnea (adult) (pediatric): Secondary | ICD-10-CM | POA: Diagnosis not present

## 2020-05-09 DIAGNOSIS — Z8249 Family history of ischemic heart disease and other diseases of the circulatory system: Secondary | ICD-10-CM | POA: Insufficient documentation

## 2020-05-09 DIAGNOSIS — Z91030 Bee allergy status: Secondary | ICD-10-CM | POA: Insufficient documentation

## 2020-05-09 DIAGNOSIS — J984 Other disorders of lung: Secondary | ICD-10-CM | POA: Insufficient documentation

## 2020-05-09 DIAGNOSIS — Z79899 Other long term (current) drug therapy: Secondary | ICD-10-CM | POA: Insufficient documentation

## 2020-05-09 DIAGNOSIS — Z9119 Patient's noncompliance with other medical treatment and regimen: Secondary | ICD-10-CM | POA: Insufficient documentation

## 2020-05-09 DIAGNOSIS — Z6838 Body mass index (BMI) 38.0-38.9, adult: Secondary | ICD-10-CM | POA: Diagnosis not present

## 2020-05-09 DIAGNOSIS — I480 Paroxysmal atrial fibrillation: Principal | ICD-10-CM | POA: Insufficient documentation

## 2020-05-09 DIAGNOSIS — Z885 Allergy status to narcotic agent status: Secondary | ICD-10-CM | POA: Insufficient documentation

## 2020-05-09 DIAGNOSIS — Z7901 Long term (current) use of anticoagulants: Secondary | ICD-10-CM | POA: Diagnosis not present

## 2020-05-09 DIAGNOSIS — K219 Gastro-esophageal reflux disease without esophagitis: Secondary | ICD-10-CM | POA: Insufficient documentation

## 2020-05-09 DIAGNOSIS — E669 Obesity, unspecified: Secondary | ICD-10-CM | POA: Insufficient documentation

## 2020-05-09 LAB — CBC
HCT: 47.7 % (ref 39.0–52.0)
Hemoglobin: 16.3 g/dL (ref 13.0–17.0)
MCH: 31.8 pg (ref 26.0–34.0)
MCHC: 34.2 g/dL (ref 30.0–36.0)
MCV: 93 fL (ref 80.0–100.0)
Platelets: 185 10*3/uL (ref 150–400)
RBC: 5.13 MIL/uL (ref 4.22–5.81)
RDW: 14.1 % (ref 11.5–15.5)
WBC: 9.8 10*3/uL (ref 4.0–10.5)
nRBC: 0 % (ref 0.0–0.2)

## 2020-05-09 LAB — BASIC METABOLIC PANEL
Anion gap: 11 (ref 5–15)
BUN: 18 mg/dL (ref 8–23)
CO2: 22 mmol/L (ref 22–32)
Calcium: 9 mg/dL (ref 8.9–10.3)
Chloride: 103 mmol/L (ref 98–111)
Creatinine, Ser: 0.94 mg/dL (ref 0.61–1.24)
GFR calc Af Amer: >60 mL/min (ref >60)
GFR calc non Af Amer: >60 mL/min (ref >60)
Glucose, Bld: 284 mg/dL — ABNORMAL HIGH (ref 70–99)
Potassium: 4.1 mmol/L (ref 3.5–5.1)
Sodium: 136 mmol/L (ref 135–145)

## 2020-05-09 LAB — SARS CORONAVIRUS 2 BY RT PCR (HOSPITAL ORDER, PERFORMED IN ~~LOC~~ HOSPITAL LAB): SARS Coronavirus 2: NEGATIVE

## 2020-05-09 LAB — HEPATIC FUNCTION PANEL
ALT: 35 U/L (ref 0–44)
AST: 32 U/L (ref 15–41)
Albumin: 3.9 g/dL (ref 3.5–5.0)
Alkaline Phosphatase: 125 U/L (ref 38–126)
Bilirubin, Direct: 0.2 mg/dL (ref 0.0–0.2)
Indirect Bilirubin: 0.8 mg/dL (ref 0.3–0.9)
Total Bilirubin: 1 mg/dL (ref 0.3–1.2)
Total Protein: 6.9 g/dL (ref 6.5–8.1)

## 2020-05-09 LAB — MAGNESIUM: Magnesium: 2 mg/dL (ref 1.7–2.4)

## 2020-05-09 LAB — ECHOCARDIOGRAM COMPLETE
Height: 70 in
Weight: 4240 oz

## 2020-05-09 LAB — LIPASE, BLOOD: Lipase: 33 U/L (ref 11–51)

## 2020-05-09 LAB — TROPONIN I (HIGH SENSITIVITY)
Troponin I (High Sensitivity): 5 ng/L (ref <18)
Troponin I (High Sensitivity): 5 ng/L (ref <18)

## 2020-05-09 LAB — GLUCOSE, CAPILLARY: Glucose-Capillary: 159 mg/dL — ABNORMAL HIGH (ref 70–99)

## 2020-05-09 LAB — BRAIN NATRIURETIC PEPTIDE: B Natriuretic Peptide: 148 pg/mL — ABNORMAL HIGH (ref 0.0–100.0)

## 2020-05-09 LAB — CBG MONITORING, ED: Glucose-Capillary: 209 mg/dL — ABNORMAL HIGH (ref 70–99)

## 2020-05-09 LAB — HEMOGLOBIN A1C
Hgb A1c MFr Bld: 8 % — ABNORMAL HIGH (ref 4.8–5.6)
Mean Plasma Glucose: 182.9 mg/dL

## 2020-05-09 LAB — TSH: TSH: 3.293 u[IU]/mL (ref 0.350–4.500)

## 2020-05-09 MED ORDER — METOPROLOL SUCCINATE ER 50 MG PO TB24
100.0000 mg | ORAL_TABLET | Freq: Two times a day (BID) | ORAL | Status: DC
  Administered 2020-05-09 – 2020-05-10 (×2): 100 mg via ORAL
  Filled 2020-05-09 (×2): qty 2

## 2020-05-09 MED ORDER — SODIUM CHLORIDE 0.9 % IV SOLN: 250.0000 mL | INTRAVENOUS | Status: DC | PRN

## 2020-05-09 MED ORDER — ISOSORBIDE MONONITRATE ER 60 MG PO TB24
120.0000 mg | ORAL_TABLET | Freq: Every morning | ORAL | Status: DC
  Administered 2020-05-10: 120 mg via ORAL
  Filled 2020-05-09: qty 2

## 2020-05-09 MED ORDER — DILTIAZEM HCL-DEXTROSE 125-5 MG/125ML-% IV SOLN (PREMIX)
5.0000 mg/h | INTRAVENOUS | Status: DC
  Administered 2020-05-09: 5 mg/h via INTRAVENOUS
  Filled 2020-05-09: qty 125

## 2020-05-09 MED ORDER — INSULIN ASPART 100 UNIT/ML ~~LOC~~ SOLN: 0.0000 [IU] | Freq: Every day | SUBCUTANEOUS | Status: DC

## 2020-05-09 MED ORDER — PANTOPRAZOLE SODIUM 40 MG PO TBEC
40.0000 mg | DELAYED_RELEASE_TABLET | Freq: Every morning | ORAL | Status: DC
  Administered 2020-05-10: 40 mg via ORAL
  Filled 2020-05-09: qty 1

## 2020-05-09 MED ORDER — RIVAROXABAN 20 MG PO TABS
20.0000 mg | ORAL_TABLET | Freq: Every day | ORAL | Status: DC
  Administered 2020-05-09: 20 mg via ORAL
  Filled 2020-05-09: qty 1

## 2020-05-09 MED ORDER — PERFLUTREN LIPID MICROSPHERE
1.0000 mL | INTRAVENOUS | Status: AC | PRN
  Administered 2020-05-09: 1 mL via INTRAVENOUS
  Filled 2020-05-09: qty 10

## 2020-05-09 MED ORDER — ACETAMINOPHEN 650 MG RE SUPP: 650.0000 mg | Freq: Four times a day (QID) | RECTAL | Status: DC | PRN

## 2020-05-09 MED ORDER — SODIUM CHLORIDE 0.9% FLUSH: 3.0000 mL | INTRAVENOUS | Status: DC | PRN

## 2020-05-09 MED ORDER — ASPIRIN 81 MG PO CHEW
324.0000 mg | CHEWABLE_TABLET | Freq: Once | ORAL | Status: AC
  Administered 2020-05-09: 324 mg via ORAL
  Filled 2020-05-09: qty 4

## 2020-05-09 MED ORDER — ACETAMINOPHEN 325 MG PO TABS: 650.0000 mg | ORAL_TABLET | Freq: Four times a day (QID) | ORAL | Status: DC | PRN

## 2020-05-09 MED ORDER — CHLORHEXIDINE GLUCONATE CLOTH 2 % EX PADS
6.0000 | MEDICATED_PAD | Freq: Every day | CUTANEOUS | Status: DC
  Administered 2020-05-09 – 2020-05-10 (×2): 6 via TOPICAL

## 2020-05-09 MED ORDER — ONDANSETRON HCL 4 MG PO TABS: 4.0000 mg | ORAL_TABLET | Freq: Four times a day (QID) | ORAL | Status: DC | PRN

## 2020-05-09 MED ORDER — ONDANSETRON HCL 4 MG/2ML IJ SOLN: 4.0000 mg | Freq: Four times a day (QID) | INTRAMUSCULAR | Status: DC | PRN

## 2020-05-09 MED ORDER — HYDROCODONE-ACETAMINOPHEN 5-325 MG PO TABS: 1.0000 | ORAL_TABLET | Freq: Four times a day (QID) | ORAL | Status: DC | PRN

## 2020-05-09 MED ORDER — DILTIAZEM HCL 60 MG PO TABS
60.0000 mg | ORAL_TABLET | Freq: Two times a day (BID) | ORAL | Status: DC
  Administered 2020-05-09 – 2020-05-10 (×3): 60 mg via ORAL
  Filled 2020-05-09 (×3): qty 1

## 2020-05-09 MED ORDER — ATORVASTATIN CALCIUM 40 MG PO TABS
80.0000 mg | ORAL_TABLET | Freq: Every day | ORAL | Status: DC
  Administered 2020-05-09: 80 mg via ORAL
  Filled 2020-05-09: qty 2

## 2020-05-09 MED ORDER — GLIMEPIRIDE 2 MG PO TABS
4.0000 mg | ORAL_TABLET | Freq: Two times a day (BID) | ORAL | Status: DC
  Administered 2020-05-09 – 2020-05-10 (×2): 4 mg via ORAL
  Filled 2020-05-09 (×2): qty 2

## 2020-05-09 MED ORDER — INSULIN ASPART 100 UNIT/ML ~~LOC~~ SOLN
0.0000 [IU] | Freq: Three times a day (TID) | SUBCUTANEOUS | Status: DC
  Administered 2020-05-10: 2 [IU] via SUBCUTANEOUS
  Administered 2020-05-10: 3 [IU] via SUBCUTANEOUS

## 2020-05-09 MED ORDER — DILTIAZEM LOAD VIA INFUSION
15.0000 mg | Freq: Once | INTRAVENOUS | Status: AC
  Administered 2020-05-09: 15 mg via INTRAVENOUS
  Filled 2020-05-09: qty 15

## 2020-05-09 MED ORDER — SODIUM CHLORIDE 0.9% FLUSH
3.0000 mL | Freq: Two times a day (BID) | INTRAVENOUS | Status: DC
  Administered 2020-05-09 – 2020-05-10 (×2): 3 mL via INTRAVENOUS

## 2020-05-09 NOTE — Progress Notes (Addendum)
Patient discussed with primary team. Presents with afib with RVR. He has been managed for afib as outpatient in our clinic and afib clinic, with recent increase in symptoms. Afib considering antiarryhtmic pending echo findings Friday. We had been managing him with toprol and diltiazem at home. Admitted with afib with RVR, would continue his toprol 100mg  bid and increase his dilt to 60mg  bid. Hold his home benazepril to allow room with bp's. Similar presentation 6/1 with chest pain found to be in afib with RVR at that time as well, I would not pursue ischemic testing unless ongoing symptosm after his afib is controlled. We are available for any issues this admssion, plan as above. Outpatient echo ordered can get done during [place].    Zandra Abts MD

## 2020-05-09 NOTE — H&P (Signed)
History and Physical    Lucas [name] [employer]. NTI:[id number] DOB: 03/25/1956 DOA: 05/09/2020  PCP: Glenda Chroman, MD   Patient coming from: Home  Chief Complaint: Tachycardia/palpitations  HPI: Lucas [name]. is a 64 y.o. male with medical history significant for CAD, hypertension, type 2 diabetes, GERD, Lucas-untreated, obesity, atrial flutter with ablation 2017, and paroxysmal atrial fibrillation who presented to the ED this a.m. with some tachycardia and palpitations as well as mild chest tightness noted earlier this morning.  His chest tightness was nonradiating and was not associated with any activity in particular.  He denies any shortness of breath, diaphoresis, nausea, vomiting, cough, fevers, or chills.  He states that he had taken his home medications as usual this morning.   ED Course: Patient was noted to have some hyperglycemia with glucose of 284, but no other significant abnormalities were noted in his lab work.  Chest x-ray with no acute findings.  His troponins were not elevated and remained at 5.  BNP is 148, and TSH is 3.29.  He is EKG demonstrated atrial fibrillation with RVR at 147 bpm.  He was given a Cardizem bolus and then drip and had lower blood pressures subsequently, but became rate controlled.  Review of Systems: All others reviewed and otherwise negative except as noted above.  Past Medical History:  Diagnosis Date  . Arthritis   . Bilateral carpal tunnel syndrome 08/16/2019  . CAD (coronary artery disease)    a. LHC on 01/29/16 which revealed significant apical LAD stenosis, best treated medically. There was moderate disease in the D2 and mid nondominant RCA.[name] No PCI performed b. 03/2018: repeat cath showing regression of his LAD stenosis now being at 50% with 60% mid RCA stenosis, 45% ostial LPDA stenosis, and 45% ostial second diagonal stenosis.  . Cataract   . Diabetes mellitus    type 2  . GERD (gastroesophageal reflux disease)   . Heart disease   .  Hyperlipidemia   . Hypertension   . Obesity   . PAF (paroxysmal atrial fibrillation) ([place]) 01/2016  . Sleep apnea    has CPAP machine but not wear  . Tubular adenoma of colon 08/2012  . 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 ANKLE SURGERY Left 2012   tendon repair  . CARDIAC CATHETERIZATION [name] 01/29/2016   Procedure: Left Heart Cath and Coronary Angiography;  Surgeon: [name], MD;  Location: [facility];  Service: Cardiovascular;  Laterality: [name];  . CARPAL TUNNEL RELEASE Right 09/06/2019   Procedure: CARPAL TUNNEL RELEASE;  Surgeon: Daryll Brod, MD;  Location: [facility];  Service: Orthopedics;  Laterality: Right;  . CARPAL TUNNEL RELEASE Left 10/11/2019   Procedure: LEFT CARPAL TUNNEL RELEASE;  Surgeon: Daryll Brod, MD;  Location: [facility];  Service: Orthopedics;  Laterality: Left;  IV REGIONAL FOREARM BLOCK  . CATARACT EXTRACTION W/PHACO  11/11/2012   Procedure: CATARACT EXTRACTION PHACO AND INTRAOCULAR LENS PLACEMENT (IOC);  Surgeon: [name], MD;  Location: [facility];  Service: Ophthalmology;  Laterality: Right;  CDE:  5.56  . CATARACT EXTRACTION W/PHACO Left 12/15/2013   Procedure: CATARACT EXTRACTION PHACO AND INTRAOCULAR LENS PLACEMENT (IOC);  Surgeon: [name], MD;  Location: [facility];  Service: Ophthalmology;  Laterality: Left;  CDE:13.36  . CHOLECYSTECTOMY    . CHONDROPLASTY Right 05/28/2017   Procedure: RIGHT KNEE CHONDROPLASTY;  Surgeon: [name], MD;  Location:  [facility];  Service: Orthopedics;  Laterality: Right;  . COLONOSCOPY    . CYSTOSCOPY W/ URETERAL STENT PLACEMENT     right  . ELECTROPHYSIOLOGIC STUDY [name] 06/02/2016   Procedure: A-Flutter Ablation;  Surgeon: [name], MD;  Location: [facility];  Service: Cardiovascular;  Laterality: [name];  . KNEE ARTHROSCOPY WITH MEDIAL MENISECTOMY Right 05/28/2017   Procedure: KNEE  ARTHROSCOPY WITH MEDIAL MENISECTOMY WITH EXTENSIVE SYNOVECTOMY;  Surgeon: [name], MD;  Location: [facility];  Service: Orthopedics;  Laterality: Right;  . LEFT HEART CATH AND CORONARY ANGIOGRAPHY [name] 03/30/2018   Procedure: LEFT HEART CATH AND CORONARY ANGIOGRAPHY;  Surgeon: Leonie Man, MD;  Location: [facility];  Service: Cardiovascular;  Laterality: [name];  . left knee sugery Left    Arthroscopy  . NASAL SINUS SURGERY    . POLYPECTOMY    . TONSILLECTOMY       reports that he has never smoked. He has never used smokeless tobacco. He reports current alcohol use. He reports that he does not use drugs.  Allergies  Allergen Reactions  . Bee Venom Swelling  . Codeine Nausea Only    Family History  Problem Relation Age of Onset  . Heart murmur Mother   . Aneurysm Mother   . Hypertension Mother   . Cirrhosis Father   . Heart attack Sister   . Diabetes Sister   . Aneurysm Maternal Grandmother   . Stroke Maternal Grandmother   . Heart attack Paternal Grandmother   . Stroke Sister   . Colon cancer Neg Hx   . Rectal cancer Neg Hx   . Stomach cancer Neg Hx     Prior to Admission medications   Medication Sig Start Date End Date Taking? Authorizing Provider  ACCU-CHEK GUIDE test strip  05/05/20  Yes [provider]  acetaminophen (TYLENOL) 325 MG tablet Take 650 mg by mouth every 6 (six) hours as needed for moderate pain or headache.    Yes [provider]  atorvastatin (LIPITOR) 80 MG tablet TAKE 1 TABLET (80 MG TOTAL) BY MOUTH DAILY AT 6 PM. 12/22/19  Yes Branch, Alphonse Guild, MD  benazepril (LOTENSIN) 20 MG tablet Take 20 mg by mouth in the morning.    Yes [provider]  diltiazem (CARDIZEM) 30 MG tablet Take 1 tablet (30 mg total) by mouth 2 (two) times daily. May take 1 - 2 tabs extra as needed for palpitations 04/12/20  Yes Branch, Alphonse Guild, MD  furosemide (LASIX) 20 MG tablet Take 20 mg by mouth daily as needed for  fluid.    Yes [provider]  glimepiride (AMARYL) 4 MG tablet Take 4 mg by mouth 2 (two) times daily.  07/25/12  Yes [provider]  HYDROcodone-acetaminophen (NORCO) 5-325 MG tablet Take 1 tablet by mouth every 6 (six) hours as needed. Patient taking differently: Take 1 tablet by mouth every 6 (six) hours as needed for moderate pain.  10/11/19  Yes Daryll Brod, MD  isosorbide mononitrate (IMDUR) 120 MG 24 hr tablet Take 1 tablet (120 mg total) by mouth daily. Patient taking differently: Take 120 mg by mouth in the morning.  12/02/19  Yes Branch, Alphonse Guild, MD  metoprolol succinate (TOPROL-XL) 100 MG 24 hr tablet Take 100 mg by mouth in the morning and at bedtime.    Yes [provider]  nitroGLYCERIN (NITROSTAT) 0.4 MG SL tablet Place 1 tablet (0.4 mg total) under the tongue every  5 (five) minutes as needed for chest pain. 03/16/18  Yes Branch, Alphonse Guild, MD  OZEMPIC, 0.25 OR 0.5 MG/DOSE, 2 MG/1.5ML SOPN Inject 0.5 mg into the skin every 7 (seven) days. 03/28/20  Yes [provider]  pantoprazole (PROTONIX) 40 MG tablet Take 40 mg by mouth in the morning.    Yes [provider]  SYNJARDY XR 12.03-999 MG TB24 Take 1 tablet by mouth in the morning and at bedtime. 01/03/20  Yes [provider]  XARELTO 20 MG TABS tablet TAKE 1 TABLET (20 MG TOTAL) BY MOUTH DAILY WITH SUPPER. Patient taking differently: Take 20 mg by mouth daily with supper.  12/15/19  Yes Arnoldo Lenis, MD    Physical Exam: Vitals:   05/09/20 1200 05/09/20 1230 05/09/20 1300 05/09/20 1330  BP: (!) 101/50  (!) 95/56 (!) 113/59  Pulse:  63 62 66  Resp: 19 12 17 20   Temp:  97.7 F (36.5 C)    TempSrc:      SpO2: 98% 97% 93% 96%  Weight:      Height:        Constitutional: NAD, calm, comfortable, obese Vitals:   05/09/20 1200 05/09/20 1230 05/09/20 1300 05/09/20 1330  BP: (!) 101/50  (!) 95/56 (!) 113/59  Pulse:  63 62 66  Resp: 19 12 17 20   Temp:  97.7 F (36.5 C)     TempSrc:      SpO2: 98% 97% 93% 96%  Weight:      Height:       Eyes: lids and conjunctivae normal ENMT: Mucous membranes are moist.  Neck: normal, supple Respiratory: clear to auscultation bilaterally. Normal respiratory effort. No accessory muscle use.  Cardiovascular: Irregular, no murmurs. No extremity edema. Abdomen: no tenderness, no distention. Bowel sounds positive.  Musculoskeletal:  No joint deformity upper and lower extremities.   Skin: no rashes, lesions, ulcers.  Psychiatric: Normal judgment and insight. Alert and oriented x 3. Normal mood.   Labs on Admission: I have personally reviewed following labs and imaging studies  CBC: Recent Labs  Lab 05/09/20 0727  WBC 9.8  HGB 16.3  HCT 47.7  MCV 93.0  PLT 956   Basic Metabolic Panel: Recent Labs  Lab 05/09/20 0727  NA 136  K 4.1  CL 103  CO2 22  GLUCOSE 284*  BUN 18  CREATININE 0.94  CALCIUM 9.0  MG 2.0   GFR: Estimated Creatinine Clearance: 101.8 mL/min (by C-G formula based on SCr of 0.94 mg/dL). Liver Function Tests: Recent Labs  Lab 05/09/20 0727  AST 32  ALT 35  ALKPHOS 125  BILITOT 1.0  PROT 6.9  ALBUMIN 3.9   Recent Labs  Lab 05/09/20 0727  LIPASE 33   No results for input(s): AMMONIA in the last 168 hours. Coagulation Profile: No results for input(s): INR, PROTIME in the last 168 hours. Cardiac Enzymes: No results for input(s): CKTOTAL, CKMB, CKMBINDEX, TROPONINI in the last 168 hours. BNP (last 3 results) No results for input(s): PROBNP in the last 8760 hours. HbA1C: No results for input(s): HGBA1C in the last 72 hours. CBG: Recent Labs  Lab 05/09/20 0817  GLUCAP 209*   Lipid Profile: No results for input(s): CHOL, HDL, LDLCALC, TRIG, CHOLHDL, LDLDIRECT in the last 72 hours. Thyroid Function Tests: Recent Labs    05/09/20 0727  TSH 3.293   Anemia Panel: No results for input(s): VITAMINB12, FOLATE, FERRITIN, TIBC, IRON, RETICCTPCT in the last 72 hours. Urine  analysis:  Component Value Date/Time   COLORURINE YELLOW 05/29/2016 1806   APPEARANCEUR CLEAR 05/29/2016 1806   LABSPEC 1.029 05/29/2016 1806   PHURINE 5.5 05/29/2016 1806   GLUCOSEU >1000 (A) 05/29/2016 1806   HGBUR NEGATIVE 05/29/2016 1806   BILIRUBINUR NEGATIVE 05/29/2016 1806   KETONESUR 15 (A) 05/29/2016 1806   PROTEINUR NEGATIVE 05/29/2016 1806   UROBILINOGEN 1.0 08/30/2007 0949   NITRITE NEGATIVE 05/29/2016 1806   LEUKOCYTESUR NEGATIVE 05/29/2016 1806    Radiological Exams on Admission: DG Chest Portable 1 View  Result Date: 05/09/2020 CLINICAL DATA:  Chest pain and arrhythmia. EXAM: PORTABLE CHEST 1 VIEW COMPARISON:  04/10/2020 FINDINGS: Artifact overlies the chest. Lordotic positioning. Heart size is normal. Chronic linear scar in the left perihilar lung. The pulmonary vascularity is normal. No edema. The lungs are otherwise clear. No effusions. No significant bone finding. IMPRESSION: Overlying artifact. Lordotic positioning. No active disease allowing for that. Electronically Signed   By: Nelson Chimes M.D.   On: 05/09/2020 08:06    EKG: Independently reviewed. 147bpm, Afib with RVR. Low voltage.  Assessment/Plan Active Problems:   Atrial fibrillation with RVR (HCC)    Atrial fibrillation with RVR -Noted in setting of paroxysmal atrial fibrillation -We will obtain 2D echocardiogram per cardiology recommendations while inpatient, prior 2D echocardiogram 01/2016 with LVEF 55-60% -TSH 3.29 -Continue on Toprol 100 mg twice daily as prescribed as well as Cardizem increased to 60 mg twice daily per cardiology recommendations -No further need for Cardizem drip and therefore will be moved to telemetry -Continue telemetry monitoring -Plans to follow-up with atrial fibrillation clinic  CAD -Continue Imdur, statin, and metoprolol -Troponins without elevation and no sign of ACS noted  Obesity/type 2 diabetes with hyperglycemia -Continue Amaryl 4 mg twice daily while  inpatient -Add SSI -Will need lifestyle changes in outpatient setting  Hypertension -Continue current Toprol XL dose with 100 mg twice daily -Increase Cardizem to 60 mg twice daily as recommended by cardiology -Hold benazepril to allow for titration -Continue Imdur -Hold Lasix  Dyslipidemia -Continue statin  Lucas -Noncompliant with home CPAP  GERD -Continue Protonix   DVT prophylaxis: Xarelto Code Status: Full code Family Communication:Wife at bedside  Disposition Plan:Treatment and evaluation of afib/RVR Consults called:Spoke with Cardiology Admission status: Obs, Tele  Status is: Observation  The patient remains OBS appropriate and will d/c before 2 midnights.  Dispo: The patient is from: Home              Anticipated d/c is to: Home              Anticipated d/c date is: 1 day              Patient currently is not medically stable to d/c.   Tyreanna Bisesi D Manuella Ghazi DO [facility]  If 7PM-7AM, please contact night-coverage [email or web address]  05/09/2020, 2:26 PM

## 2020-05-09 NOTE — Progress Notes (Signed)
Inpatient Diabetes Program Recommendations  AACE/ADA: New Consensus Statement on Inpatient Glycemic Control   Target Ranges:  Prepandial:   less than 140 mg/dL      Peak postprandial:   less than 180 mg/dL (1-2 hours)      Critically ill patients:  140 - 180 mg/dL   Results for FENTON, CANDEE (MRN [id number]) as of 05/09/2020 13:31  Ref. Range 05/09/2020 08:17  Glucose-Capillary Latest Ref Range: 70 - 99 mg/dL 209 (H)   Results for KELVON, GIANNINI (MRN [id number]) as of 05/09/2020 13:31  Ref. Range 05/09/2020 07:27  Glucose Latest Ref Range: 70 - 99 mg/dL 284 (H)   Review of Glycemic Control  Diabetes history: DM2 Outpatient Diabetes medications: Amaryl 4 mg BID, Ozempic 0.5 mg Qweek, Synjardy XR 12.03-999 mg BID Current orders for Inpatient glycemic control: Amaryl 4 mg BID  Inpatient Diabetes Program Recommendations:   Correction (SSI): While inpatient, please consider ordering CBGs AC&HS with Novolog 0-9 units TID with meals and Novolog 0-5 units QHS.  Thanks, Barnie Alderman, RN, MSN, CDE Diabetes Coordinator Inpatient Diabetes Program [phone] (Team Pager from 8am to 5pm)

## 2020-05-09 NOTE — Progress Notes (Signed)
*  PRELIMINARY RESULTS* Echocardiogram 2D Echocardiogram with definity has been performed.  Leavy Cella 05/09/2020, 4:59 PM

## 2020-05-09 NOTE — ED Provider Notes (Signed)
[facility] Provider Note   CSN: [id number] Arrival date & time: 05/09/20  4098     History Chief Complaint  Patient presents with  . Tachycardia    Cauy Melody. is a 64 y.o. male.  Patient with history of atrial fibrillation compliant with Xarelto, metoprolol and diltiazem, coronary artery disease last cath 2017 showing 50 and 60% lesions, followed by Dr. Harl Bowie, history of atrial flutter, high blood pressure and diabetes presents with palpitations, chest tightness and decreased appetite since early this morning.  Patient feels he said 2 or 3 episodes of A. fib since earlier this year.  Patient denies history of cardioversion.  Mild chest tightness nonradiating this morning.  No history of heart attack or stents.  Patient denies cough shortness of breath or fever.        Past Medical History:  Diagnosis Date  . Arthritis   . Bilateral carpal tunnel syndrome 08/16/2019  . CAD (coronary artery disease)    a. LHC on 01/29/16 which revealed significant apical LAD stenosis, best treated medically. There was moderate disease in the D2 and mid nondominant RCA.[name] No PCI performed b. 03/2018: repeat cath showing regression of his LAD stenosis now being at 50% with 60% mid RCA stenosis, 45% ostial LPDA stenosis, and 45% ostial second diagonal stenosis.  . Cataract   . Diabetes mellitus    type 2  . GERD (gastroesophageal reflux disease)   . Heart disease   . Hyperlipidemia   . Hypertension   . Obesity   . PAF (paroxysmal atrial fibrillation) ([place]) 01/2016  . Sleep apnea    has CPAP machine but not wear  . Tubular adenoma of colon 08/2012  . Ulnar neuropathy at elbow 08/16/2019   Bilateral    Patient Active Problem List   Diagnosis Date Noted  . Atrial fibrillation with RVR ([place]) 05/09/2020  . Bilateral carpal tunnel syndrome 08/16/2019  . Ulnar neuropathy at elbow 08/16/2019  . Typical atrial flutter ([place])   . Obesity 05/30/2016  . Leukocytosis  05/30/2016  . AKI (acute kidney injury) ([place]) 05/30/2016  . Diabetes mellitus, type II ([place]) 01/30/2016  . Coronary artery disease involving native coronary artery of native heart with angina pectoris ([place]); Apical LAD 80%, LPDA ~45%. D2 ~60% & non-dominant RCA 80%.   . Hypertension   . Hyperlipidemia   . Sleep apnea   . Atrial fibrillation with rapid ventricular response ([place]) 01/29/2016  . Elevated troponin     Past Surgical History:  Procedure Laterality Date  . ANKLE SURGERY Left 2012   tendon repair  . CARDIAC CATHETERIZATION [name] 01/29/2016   Procedure: Left Heart Cath and Coronary Angiography;  Surgeon: [name], MD;  Location: [facility];  Service: Cardiovascular;  Laterality: [name];  . CARPAL TUNNEL RELEASE Right 09/06/2019   Procedure: CARPAL TUNNEL RELEASE;  Surgeon: Daryll Brod, MD;  Location: [facility];  Service: Orthopedics;  Laterality: Right;  . CARPAL TUNNEL RELEASE Left 10/11/2019   Procedure: LEFT CARPAL TUNNEL RELEASE;  Surgeon: Daryll Brod, MD;  Location: [facility];  Service: Orthopedics;  Laterality: Left;  IV REGIONAL FOREARM BLOCK  . CATARACT EXTRACTION W/PHACO  11/11/2012   Procedure: CATARACT EXTRACTION PHACO AND INTRAOCULAR LENS PLACEMENT (IOC);  Surgeon: [name], MD;  Location: [facility];  Service: Ophthalmology;  Laterality: Right;  CDE:  5.56  . CATARACT EXTRACTION W/PHACO Left 12/15/2013   Procedure: CATARACT EXTRACTION PHACO AND INTRAOCULAR LENS PLACEMENT (IOC);  Surgeon:  [name], MD;  Location: [facility];  Service: Ophthalmology;  Laterality: Left;  CDE:13.36  . CHOLECYSTECTOMY    . CHONDROPLASTY Right 05/28/2017   Procedure: RIGHT KNEE CHONDROPLASTY;  Surgeon: [name], MD;  Location: [facility];  Service: Orthopedics;  Laterality: Right;  . COLONOSCOPY    . CYSTOSCOPY W/ URETERAL STENT PLACEMENT     right  . ELECTROPHYSIOLOGIC STUDY [name] 06/02/2016   Procedure: A-Flutter Ablation;  Surgeon: Will  Meredith Leeds, MD;  Location: [facility];  Service: Cardiovascular;  Laterality: [name];  . KNEE ARTHROSCOPY WITH MEDIAL MENISECTOMY Right 05/28/2017   Procedure: KNEE ARTHROSCOPY WITH MEDIAL MENISECTOMY WITH EXTENSIVE SYNOVECTOMY;  Surgeon: [name], MD;  Location: [facility];  Service: Orthopedics;  Laterality: Right;  . LEFT HEART CATH AND CORONARY ANGIOGRAPHY [name] 03/30/2018   Procedure: LEFT HEART CATH AND CORONARY ANGIOGRAPHY;  Surgeon: Leonie Man, MD;  Location: [facility];  Service: Cardiovascular;  Laterality: [name];  . left knee sugery Left    Arthroscopy  . NASAL SINUS SURGERY    . POLYPECTOMY    . TONSILLECTOMY         Family History  Problem Relation Age of Onset  . Heart murmur Mother   . Aneurysm Mother   . Hypertension Mother   . Cirrhosis Father   . Heart attack Sister   . Diabetes Sister   . Aneurysm Maternal Grandmother   . Stroke Maternal Grandmother   . Heart attack Paternal Grandmother   . Stroke Sister   . Colon cancer Neg Hx   . Rectal cancer Neg Hx   . Stomach cancer Neg Hx     Social History   Tobacco Use  . Smoking status: Never Smoker  . Smokeless tobacco: Never Used  Vaping Use  . Vaping Use: Never used  Substance Use Topics  . Alcohol use: Yes    Alcohol/week: 0.0 standard drinks    Comment: rare occasion  . Drug use: No    Home Medications Prior to Admission medications   Medication Sig Start Date End Date Taking? Authorizing Provider  ACCU-CHEK GUIDE test strip  05/05/20  Yes [provider]  acetaminophen (TYLENOL) 325 MG tablet Take 650 mg by mouth every 6 (six) hours as needed for moderate pain or headache.    Yes [provider]  atorvastatin (LIPITOR) 80 MG tablet TAKE 1 TABLET (80 MG TOTAL) BY MOUTH DAILY AT 6 PM. 12/22/19  Yes Branch, Alphonse Guild, MD  benazepril (LOTENSIN) 20 MG tablet Take 20 mg by mouth in the morning.    Yes [provider]  diltiazem (CARDIZEM)  30 MG tablet Take 1 tablet (30 mg total) by mouth 2 (two) times daily. May take 1 - 2 tabs extra as needed for palpitations 04/12/20  Yes Branch, Alphonse Guild, MD  furosemide (LASIX) 20 MG tablet Take 20 mg by mouth daily as needed for fluid.    Yes [provider]  glimepiride (AMARYL) 4 MG tablet Take 4 mg by mouth 2 (two) times daily.  07/25/12  Yes [provider]  HYDROcodone-acetaminophen (NORCO) 5-325 MG tablet Take 1 tablet by mouth every 6 (six) hours as needed. Patient taking differently: Take 1 tablet by mouth every 6 (six) hours as needed for moderate pain.  10/11/19  Yes Daryll Brod, MD  isosorbide mononitrate (IMDUR) 120 MG 24 hr tablet Take 1 tablet (120 mg total) by mouth daily. Patient taking differently: Take 120 mg  by mouth in the morning.  12/02/19  Yes Branch, Alphonse Guild, MD  metoprolol succinate (TOPROL-XL) 100 MG 24 hr tablet Take 100 mg by mouth in the morning and at bedtime.    Yes [provider]  nitroGLYCERIN (NITROSTAT) 0.4 MG SL tablet Place 1 tablet (0.4 mg total) under the tongue every 5 (five) minutes as needed for chest pain. 03/16/18  Yes Branch, Alphonse Guild, MD  OZEMPIC, 0.25 OR 0.5 MG/DOSE, 2 MG/1.5ML SOPN Inject 0.5 mg into the skin every 7 (seven) days. 03/28/20  Yes [provider]  pantoprazole (PROTONIX) 40 MG tablet Take 40 mg by mouth in the morning.    Yes [provider]  SYNJARDY XR 12.03-999 MG TB24 Take 1 tablet by mouth in the morning and at bedtime. 01/03/20  Yes [provider]  XARELTO 20 MG TABS tablet TAKE 1 TABLET (20 MG TOTAL) BY MOUTH DAILY WITH SUPPER. Patient taking differently: Take 20 mg by mouth daily with supper.  12/15/19  Yes BranchAlphonse Guild, MD    Allergies    Bee venom and Codeine  Review of Systems   Review of Systems  Constitutional: Negative for chills, diaphoresis and fever.  HENT: Negative for congestion.   Eyes: Negative for visual disturbance.  Respiratory: Positive for chest  tightness and shortness of breath.   Cardiovascular: Positive for palpitations. Negative for chest pain.  Gastrointestinal: Negative for abdominal pain and vomiting.  Genitourinary: Negative for dysuria and flank pain.  Musculoskeletal: Negative for back pain, neck pain and neck stiffness.  Skin: Negative for rash.  Neurological: Negative for light-headedness and headaches.    Physical Exam Updated Vital Signs BP 96/64   Pulse 63   Temp 98.5 F (36.9 C) (Oral)   Resp 18   Ht 5\' 10"  (1.778 m)   Wt 120.2 kg   SpO2 94%   BMI 38.02 kg/m   Physical Exam Vitals and nursing note reviewed.  Constitutional:      Appearance: He is well-developed.  HENT:     Head: Normocephalic and atraumatic.  Eyes:     General:        Right eye: No discharge.        Left eye: No discharge.     Conjunctiva/sclera: Conjunctivae normal.  Neck:     Trachea: No tracheal deviation.  Cardiovascular:     Rate and Rhythm: Tachycardia present. Rhythm irregular.  Pulmonary:     Effort: Pulmonary effort is normal.     Breath sounds: Normal breath sounds.  Abdominal:     General: There is no distension.     Palpations: Abdomen is soft.     Tenderness: There is no abdominal tenderness. There is no guarding.  Musculoskeletal:        General: Swelling (mild LE bilateral) present. No tenderness.     Cervical back: Normal range of motion and neck supple.  Skin:    General: Skin is warm.     Findings: No rash.  Neurological:     Mental Status: He is alert and oriented to person, place, and time.     ED Results / Procedures / Treatments   Labs (all labs ordered are listed, but only abnormal results are displayed) Labs Reviewed  BASIC METABOLIC PANEL - Abnormal; Notable for the following components:      Result Value   Glucose, Bld 284 (*)    All other components within normal limits  BRAIN NATRIURETIC PEPTIDE - Abnormal; Notable for the following components:  B Natriuretic Peptide 148.0 (*)     All other components within normal limits  CBG MONITORING, ED - Abnormal; Notable for the following components:   Glucose-Capillary 209 (*)    All other components within normal limits  SARS CORONAVIRUS 2 BY RT PCR (HOSPITAL ORDER, [facility])  CBC  HEPATIC FUNCTION PANEL  LIPASE, BLOOD  TSH  MAGNESIUM  TROPONIN I (HIGH SENSITIVITY)  TROPONIN I (HIGH SENSITIVITY)    EKG EKG Interpretation  Date/Time:  Wednesday May 09 2020 07:18:52 EDT Ventricular Rate:  147 PR Interval:    QRS Duration: 82 QT Interval:  294 QTC Calculation: 460 R Axis:   77 Text Interpretation: Atrial fibrillation Low voltage, precordial leads Borderline repolarization abnormality Borderline ST elevation, lateral leads Confirmed by Elnora Morrison [phone]) on 05/09/2020 7:25:47 AM   Radiology DG Chest Portable 1 View  Result Date: 05/09/2020 CLINICAL DATA:  Chest pain and arrhythmia. EXAM: PORTABLE CHEST 1 VIEW COMPARISON:  04/10/2020 FINDINGS: Artifact overlies the chest. Lordotic positioning. Heart size is normal. Chronic linear scar in the left perihilar lung. The pulmonary vascularity is normal. No edema. The lungs are otherwise clear. No effusions. No significant bone finding. IMPRESSION: Overlying artifact. Lordotic positioning. No active disease allowing for that. Electronically Signed   By: Nelson Chimes M.D.   On: 05/09/2020 08:06    Procedures .Critical Care Performed by: Elnora Morrison, MD Authorized by: Elnora Morrison, MD   Critical care provider statement:    Critical care time (minutes):  80   Critical care start time:  05/09/2020 7:25 AM   Critical care end time:  05/09/2020 8:45 AM   Critical care time was exclusive of:  Separately billable procedures and treating other patients and teaching time   Critical care was necessary to treat or prevent imminent or life-threatening deterioration of the following conditions:  Cardiac failure   Critical care was time spent  personally by me on the following activities:  Evaluation of patient's response to treatment, examination of patient, ordering and performing treatments and interventions, ordering and review of laboratory studies, ordering and review of radiographic studies, pulse oximetry, re-evaluation of patient's condition and review of old charts   (including critical care time)  Medications Ordered in ED Medications  diltiazem (CARDIZEM) 1 mg/mL load via infusion 15 mg (15 mg Intravenous Bolus from Bag 05/09/20 0808)    And  diltiazem (CARDIZEM) 125 mg in dextrose 5% 125 mL (1 mg/mL) infusion (5 mg/hr Intravenous New Bag/Given 05/09/20 0815)  aspirin chewable tablet 324 mg (324 mg Oral Given 05/09/20 0805)    ED Course  I have reviewed the triage vital signs and the nursing notes.  Pertinent labs & imaging results that were available during my care of the patient were reviewed by me and considered in my medical decision making (see chart for details).    MDM Rules/Calculators/A&P                          Patient with multiple risk factors for coronary artery disease and known disease on last cath, reviewed medical records, presents with chest tightness palpitations.  Patient clinically in atrial fibrillation with rapid ventricular response.  Rhythm strip initially reviewed heart rate 146, mild elevation in aVL, irregular, tachycardia.  Formal EKG obtained and reviewed showing atrial fibrillation with rapid ventricular response nonspecific ST changes.  Patient has been compliant with medications.  Plan for diltiazem bolus and drip to improve heart rate  and symptoms.  Aspirin ordered.  Plan for blood work including troponin and thyroid testing with general CBC/electrolytes.  Chest x-ray ordered and reviewed.  Plan for cardiology consult and likely admission. Patient improved on reassessment heart rate in the 80s, mild decrease in blood pressure to decrease the drip rate.  No active chest pain.  Paged  cardiology twice no response.  Discussed with hospitalist for admission and decision for either transferring [facility] or keeping at [facility].  Blood work reviewed troponins negative, BNP 148 decreased compared to previous, hemoglobin normal, white blood cell count normal. Chest x-ray reviewed no acute abnormalities. Patient now stable for admission.  Final Clinical Impression(s) / ED Diagnoses Final diagnoses:  Atrial fibrillation with RVR ([place])  Chest tightness    Rx / DC Orders ED Discharge Orders    None       Elnora Morrison, MD 05/09/20 1104

## 2020-05-09 NOTE — ED Triage Notes (Signed)
Pt reports history of atrial fib.  Reports around 5am this morning pt took his morning meds and checked his bp.  Reports bp and hr was fluctuating.  Pt reports weakness, chest pain, and sob.

## 2020-05-10 DIAGNOSIS — I4891 Unspecified atrial fibrillation: Secondary | ICD-10-CM | POA: Diagnosis not present

## 2020-05-10 LAB — CBC
HCT: 45.1 % (ref 39.0–52.0)
Hemoglobin: 14.8 g/dL (ref 13.0–17.0)
MCH: 31.1 pg (ref 26.0–34.0)
MCHC: 32.8 g/dL (ref 30.0–36.0)
MCV: 94.7 fL (ref 80.0–100.0)
Platelets: 168 10*3/uL (ref 150–400)
RBC: 4.76 MIL/uL (ref 4.22–5.81)
RDW: 14.2 % (ref 11.5–15.5)
WBC: 8.6 10*3/uL (ref 4.0–10.5)
nRBC: 0 % (ref 0.0–0.2)

## 2020-05-10 LAB — BASIC METABOLIC PANEL
Anion gap: 9 (ref 5–15)
BUN: 20 mg/dL (ref 8–23)
CO2: 28 mmol/L (ref 22–32)
Calcium: 9 mg/dL (ref 8.9–10.3)
Chloride: 104 mmol/L (ref 98–111)
Creatinine, Ser: 0.97 mg/dL (ref 0.61–1.24)
GFR calc Af Amer: >60 mL/min (ref >60)
GFR calc non Af Amer: >60 mL/min (ref >60)
Glucose, Bld: 120 mg/dL — ABNORMAL HIGH (ref 70–99)
Potassium: 4.7 mmol/L (ref 3.5–5.1)
Sodium: 141 mmol/L (ref 135–145)

## 2020-05-10 LAB — HIV ANTIBODY (ROUTINE TESTING W REFLEX): HIV Screen 4th Generation wRfx: NONREACTIVE

## 2020-05-10 LAB — MRSA PCR SCREENING: MRSA by PCR: NEGATIVE

## 2020-05-10 LAB — GLUCOSE, CAPILLARY
Glucose-Capillary: 129 mg/dL — ABNORMAL HIGH (ref 70–99)
Glucose-Capillary: 165 mg/dL — ABNORMAL HIGH (ref 70–99)

## 2020-05-10 LAB — MAGNESIUM: Magnesium: 2 mg/dL (ref 1.7–2.4)

## 2020-05-10 MED ORDER — DILTIAZEM HCL 60 MG PO TABS: 60.0000 mg | ORAL_TABLET | Freq: Two times a day (BID) | ORAL | 2 refills | Status: DC

## 2020-05-10 NOTE — Progress Notes (Signed)
Discharge instructions reviewed with patient and patient's wife. Both verbalized understanding of instructions. Patient discharged home with wife in stable condition.  

## 2020-05-10 NOTE — Care Management Obs Status (Signed)
[facility] NOTIFICATION   Patient Details  Name: Lucas [name]. MRN: [id number] Date of Birth: May 26, 1956   Medicare Observation Status Notification Given:  Yes    Boneta Lucks, RN 05/10/2020, 9:21 AM

## 2020-05-10 NOTE — Discharge Summary (Addendum)
Physician Discharge Summary  Lucas [name]. KWI:[id number] DOB: 1956/06/05 DOA: 05/09/2020  PCP: Lucas Chroman, MD  Admit date: 05/09/2020  Discharge date: 05/10/2020  Admitted From:Home  Disposition:  Home  Recommendations for Outpatient Follow-up:  1. Follow up with PCP in 1-2 weeks 2. Follow-up with cardiology as scheduled with appointment scheduled with atrial fibrillation clinic on 7/8. 3. Discontinue home benazepril, but continue Imdur 4. Continue Toprol-XL 100 mg twice daily as prior and increase diltiazem to 60 mg twice daily as recommended by cardiology 5. 2D echocardiogram performed while admitted with LVEF 60-65% and no acute abnormalities noted  Home Health: None  Equipment/Devices: None  Discharge Condition: Stable  CODE STATUS: Full  Diet recommendation: Heart Healthy/carb modified  Brief/Interim Summary: Per HPI: Lucas [name]. is a 64 y.o. male with medical history significant for CAD, hypertension, type 2 diabetes, GERD, OSA-untreated, obesity, atrial flutter with ablation 2017, and paroxysmal atrial fibrillation who presented to the ED this a.m. with some tachycardia and palpitations as well as mild chest tightness noted earlier this morning.  His chest tightness was nonradiating and was not associated with any activity in particular.  He denies any shortness of breath, diaphoresis, nausea, vomiting, cough, fevers, or chills.  He states that he had taken his home medications as usual this morning.   ED Course: Patient was noted to have some hyperglycemia with glucose of 284, but no other significant abnormalities were noted in his lab work.  Chest x-ray with no acute findings.  His troponins were not elevated and remained at 5.  BNP is 148, and TSH is 3.29.  He is EKG demonstrated atrial fibrillation with RVR at 147 bpm.  He was given a Cardizem bolus and then drip and had lower blood pressures subsequently, but became rate controlled.  Patient was admitted with  atrial fibrillation with RVR in the setting of paroxysmal atrial fibrillation.  He was given an IV Cardizem bolus as well as Cardizem drip which was turned off shortly thereafter as his heart rates were improved and he was noted to have some mild hypotension.  Case was discussed with his cardiologist Lucas [name] who had recommended increasing his home diltiazem dose to 60 mg twice daily and to continue his usual home Toprol XL and hold benazepril.  These measures were performed and he had remained rate controlled throughout the evening and eventually went into sinus rhythm.  He does intermittently still go into atrial fibrillation as usual.  He denies any palpitations, shortness of breath, or chest pain and has had no other acute events during the course of his inpatient stay.  He is overall stable for discharge today and has had 2D echocardiogram with LVEF 55-60% which is stable compared to his prior reading back in 2017.  His TSH is 3.29.  He agrees to follow-up with his cardiology appointments as scheduled.  Discharge Diagnoses:  Active Problems:   Atrial fibrillation with RVR (HCC)  Principal discharge diagnosis: Atrial fibrillation with RVR in the setting of paroxysmal atrial fibrillation.  Discharge Instructions  Discharge Instructions    Diet - low sodium heart healthy   Complete by: As directed    Increase activity slowly   Complete by: As directed      Allergies as of 05/10/2020      Reactions   Bee Venom Swelling   Codeine Nausea Only      Medication List    STOP taking these medications   benazepril 20 MG tablet Commonly  known as: LOTENSIN     TAKE these medications   Accu-Chek Guide test strip Generic drug: glucose blood   acetaminophen 325 MG tablet Commonly known as: TYLENOL Take 650 mg by mouth every 6 (six) hours as needed for moderate pain or headache.   atorvastatin 80 MG tablet Commonly known as: LIPITOR TAKE 1 TABLET (80 MG TOTAL) BY MOUTH DAILY AT 6 PM.    diltiazem 60 MG tablet Commonly known as: CARDIZEM Take 1 tablet (60 mg total) by mouth every 12 (twelve) hours. What changed:   medication strength  how much to take  when to take this  additional instructions   furosemide 20 MG tablet Commonly known as: LASIX Take 20 mg by mouth daily as needed for fluid.   glimepiride 4 MG tablet Commonly known as: AMARYL Take 4 mg by mouth 2 (two) times daily.   HYDROcodone-acetaminophen 5-325 MG tablet Commonly known as: Norco Take 1 tablet by mouth every 6 (six) hours as needed. What changed: reasons to take this   isosorbide mononitrate 120 MG 24 hr tablet Commonly known as: IMDUR Take 1 tablet (120 mg total) by mouth daily. What changed: when to take this   metoprolol succinate 100 MG 24 hr tablet Commonly known as: TOPROL-XL Take 100 mg by mouth in the morning and at bedtime.   nitroGLYCERIN 0.4 MG SL tablet Commonly known as: NITROSTAT Place 1 tablet (0.4 mg total) under the tongue every 5 (five) minutes as needed for chest pain.   Ozempic (0.25 or 0.5 MG/DOSE) 2 MG/1.5ML Sopn Generic drug: Semaglutide(0.25 or 0.5MG /DOS) Inject 0.5 mg into the skin every 7 (seven) days.   pantoprazole 40 MG tablet Commonly known as: PROTONIX Take 40 mg by mouth in the morning.   Synjardy XR 12.03-999 MG Tb24 Generic drug: Empagliflozin-metFORMIN HCl ER Take 1 tablet by mouth in the morning and at bedtime.   Xarelto 20 MG Tabs tablet Generic drug: rivaroxaban TAKE 1 TABLET (20 MG TOTAL) BY MOUTH DAILY WITH SUPPER. What changed: See the new instructions.       Follow-up Information    Vyas, Dhruv B, MD Follow up in 1 week(s).   Specialty: Internal Medicine Contact information: [place] [address] [phone]        Lucas Lenis, MD Follow up in 2 week(s).   Specialty: Cardiology Contact information: [address] [place] [address] [phone]        Lucas Haw, MD. Go in 4  week(s).   Specialty: Cardiology Contact information: [address] [place] [address] [phone]              Allergies  Allergen Reactions  . Bee Venom Swelling  . Codeine Nausea Only    Consultations:  Discussion with cardiology   Procedures/Studies: DG Chest Portable 1 View  Result Date: 05/09/2020 CLINICAL DATA:  Chest pain and arrhythmia. EXAM: PORTABLE CHEST 1 VIEW COMPARISON:  04/10/2020 FINDINGS: Artifact overlies the chest. Lordotic positioning. Heart size is normal. Chronic linear scar in the left perihilar lung. The pulmonary vascularity is normal. No edema. The lungs are otherwise clear. No effusions. No significant bone finding. IMPRESSION: Overlying artifact. Lordotic positioning. No active disease allowing for that. Electronically Signed   By: Nelson Chimes M.D.   On: 05/09/2020 08:06   DG Chest Port 1 View  Result Date: 04/10/2020 CLINICAL DATA:  Chest pain for several hours EXAM: PORTABLE CHEST 1 VIEW COMPARISON:  08/22/2017, CT from  12/13/2019 FINDINGS: Cardiac shadow is within normal limits. Linear scarring is noted in the left upper lobe along the fissure stable in appearance from the prior exam. No focal infiltrate or sizable effusion is seen. No acute bony abnormality is noted. IMPRESSION: Chronic scarring in the left upper lobe. No acute abnormality noted. Electronically Signed   By: Inez Catalina M.D.   On: 04/10/2020 20:51   ECHOCARDIOGRAM COMPLETE  Result Date: 05/09/2020    ECHOCARDIOGRAM REPORT   Patient Name:   Lucas [name]. Date of Exam: 05/09/2020 Medical Rec #:  [id number]         Height:       70.0 in Accession #:    [id number]        Weight:       265.0 lb Date of Birth:  05-Oct-1956         BSA:          2.353 m Patient Age:    12 years          BP:           110/64 mmHg Patient Gender: M                 HR:           58 bpm. Exam Location:  [facility] Procedure: 2D Echo and Intracardiac Opacification Agent Indications:    Chest  Pain 786.50 / R07.9  History:        Patient has prior history of Echocardiogram examinations, most                 recent 10/08/2019. CAD, Arrythmias:Atrial Flutter and Atrial                 Fibrillation; Risk Factors:Hypertension, Dyslipidemia,                 Non-Smoker and Diabetes. Elevated Troponin.  Sonographer:    Leavy Cella RDCS (AE) Referring Phys: [id number] [place]  1. Left ventricular ejection fraction, by estimation, is 60 to 65%. The left ventricle has normal function. The left ventricle has no regional wall motion abnormalities. There is moderate left ventricular hypertrophy. Left ventricular diastolic parameters are indeterminate.  2. Right ventricular systolic function is normal. The right ventricular size is normal. There is normal pulmonary artery systolic pressure.  3. The mitral valve is normal in structure. No evidence of mitral valve regurgitation. No evidence of mitral stenosis.  4. The aortic valve is tricuspid. Aortic valve regurgitation is not visualized. No aortic stenosis is present.  5. The inferior vena cava is normal in size with greater than 50% respiratory variability, suggesting right atrial pressure of 3 mmHg. FINDINGS  Left Ventricle: Left ventricular ejection fraction, by estimation, is 60 to 65%. The left ventricle has normal function. The left ventricle has no regional wall motion abnormalities. Definity contrast agent was given IV to delineate the left ventricular  endocardial borders. The left ventricular internal cavity size was normal in size. There is moderate left ventricular hypertrophy. Left ventricular diastolic parameters are indeterminate. Right Ventricle: The right ventricular size is normal. No increase in right ventricular wall thickness. Right ventricular systolic function is normal. There is normal pulmonary artery systolic pressure. The tricuspid regurgitant velocity is 1.97 m/s, and  with an assumed right atrial pressure of 10 mmHg,  the estimated right ventricular systolic pressure is 62.6 mmHg. Left Atrium: Left atrial size was normal in size. Right Atrium: Right atrial size was  normal in size. Pericardium: There is no evidence of pericardial effusion. Mitral Valve: The mitral valve is normal in structure. No evidence of mitral valve regurgitation. No evidence of mitral valve stenosis. Tricuspid Valve: The tricuspid valve is not well visualized. Tricuspid valve regurgitation is not demonstrated. No evidence of tricuspid stenosis. Aortic Valve: The aortic valve is tricuspid. Aortic valve regurgitation is not visualized. No aortic stenosis is present. Pulmonic Valve: The pulmonic valve was not well visualized. Pulmonic valve regurgitation is not visualized. No evidence of pulmonic stenosis. Aorta: The aortic root is normal in size and structure. Pulmonary Artery: Indeterminant PASP, inadequate TR jet. Venous: The inferior vena cava is normal in size with greater than 50% respiratory variability, suggesting right atrial pressure of 3 mmHg. IAS/Shunts: No atrial level shunt detected by color flow Doppler.  LEFT VENTRICLE PLAX 2D LVIDd:         4.92 cm  Diastology LVIDs:         2.36 cm  LV e' lateral:   7.94 cm/s LV PW:         1.31 cm  LV E/e' lateral: 9.6 LV IVS:        1.24 cm  LV e' medial:    6.09 cm/s LVOT diam:     2.00 cm  LV E/e' medial:  12.5 LVOT Area:     3.14 cm  RIGHT VENTRICLE RV S prime:     10.80 cm/s TAPSE (M-mode): 2.6 cm LEFT ATRIUM             Index       RIGHT ATRIUM           Index LA diam:        3.20 cm 1.36 cm/m  RA Area:     14.60 cm LA Vol (A2C):   41.8 ml 17.77 ml/m RA Volume:   41.90 ml  17.81 ml/m LA Vol (A4C):   46.2 ml 19.64 ml/m LA Biplane Vol: 48.2 ml 20.49 ml/m   AORTA Ao Root diam: 3.30 cm MITRAL VALVE               TRICUSPID VALVE MV Area (PHT): 2.80 cm    TR Peak grad:   15.5 mmHg MV Decel Time: 271 msec    TR Vmax:        197.00 cm/s MV E velocity: 76.20 cm/s MV A velocity: 42.70 cm/s  SHUNTS MV E/A  ratio:  1.78        Systemic Diam: 2.00 cm Carlyle Dolly MD Electronically signed by Carlyle Dolly MD Signature Date/Time: 05/09/2020/7:05:39 PM    Final      Discharge Exam: Vitals:   05/10/20 0600 05/10/20 0734  BP: 116/66   Pulse: (!) 52   Resp: 17   Temp:  97.6 F (36.4 C)  SpO2: 94%    Vitals:   05/10/20 0400 05/10/20 0500 05/10/20 0600 05/10/20 0734  BP: (!) 103/48 117/70 116/66   Pulse: (!) 58 68 (!) 52   Resp: 18 15 17    Temp: 98.7 F (37.1 C)   97.6 F (36.4 C)  TempSrc: Oral   Oral  SpO2: 97% 98% 94%   Weight:  118.9 kg    Height:        General: Pt is alert, awake, not in acute distress Cardiovascular: RRR, S1/S2 +, no rubs, no gallops Respiratory: CTA bilaterally, no wheezing, no rhonchi Abdominal: Soft, NT, ND, bowel sounds + Extremities: no edema, no cyanosis    The results  of significant diagnostics from this hospitalization (including imaging, microbiology, ancillary and laboratory) are listed below for reference.     Microbiology: Recent Results (from the past 240 hour(s))  SARS Coronavirus 2 by RT PCR (hospital order, performed in [facility] hospital lab) Nasopharyngeal Nasopharyngeal Swab     Status: None   Collection Time: 05/09/20  9:11 AM   Specimen: Nasopharyngeal Swab  Result Value Ref Range Status   SARS Coronavirus 2 NEGATIVE NEGATIVE Final    Comment: (NOTE) SARS-CoV-2 target nucleic acids are NOT DETECTED.  The SARS-CoV-2 RNA is generally detectable in upper and lower respiratory specimens during the acute phase of infection. The lowest concentration of SARS-CoV-2 viral copies this assay can detect is 250 copies / mL. A negative result does not preclude SARS-CoV-2 infection and should not be used as the sole basis for treatment or other patient management decisions.  A negative result may occur with improper specimen collection / handling, submission of specimen other than nasopharyngeal swab, presence of viral mutation(s) within  the areas targeted by this assay, and inadequate number of viral copies (<250 copies / mL). A negative result must be combined with clinical observations, patient history, and epidemiological information.  Fact Sheet for Patients:   StrictlyIdeas.no  Fact Sheet for Healthcare Providers: BankingDealers.co.za  This test is not yet approved or  cleared by the [place] FDA and has been authorized for detection and/or diagnosis of SARS-CoV-2 by FDA under an Emergency Use Authorization (EUA).  This EUA will remain in effect (meaning this test can be used) for the duration of the COVID-19 declaration under Section 564([name])(1) of the Act, 21 U.S.C. section 360bbb-3([name])(1), unless the authorization is terminated or revoked sooner.  Performed at [facility], [address] [place], [place] [address]   MRSA PCR Screening     Status: None   Collection Time: 05/10/20  3:23 AM  Result Value Ref Range Status   MRSA by PCR NEGATIVE NEGATIVE Final    Comment:        The GeneXpert MRSA Assay (FDA approved for NASAL specimens only), is one component of a comprehensive MRSA colonization surveillance program. It is not intended to diagnose MRSA infection nor to guide or monitor treatment for MRSA infections. Performed at [facility], [address], [place], [place] [address]      Labs: BNP (last 3 results) Recent Labs    05/09/20 0727  BNP 725.3*   Basic Metabolic Panel: Recent Labs  Lab 05/09/20 0727 05/10/20 0400  NA 136 141  K 4.1 4.7  CL 103 104  CO2 22 28  GLUCOSE 284* 120*  BUN 18 20  CREATININE 0.94 0.97  CALCIUM 9.0 9.0  MG 2.0 2.0   Liver Function Tests: Recent Labs  Lab 05/09/20 0727  AST 32  ALT 35  ALKPHOS 125  BILITOT 1.0  PROT 6.9  ALBUMIN 3.9   Recent Labs  Lab 05/09/20 0727  LIPASE 33   No results for input(s): AMMONIA in the last 168 hours. CBC: Recent Labs  Lab 05/09/20 0727 05/10/20 0400   WBC 9.8 8.6  HGB 16.3 14.8  HCT 47.7 45.1  MCV 93.0 94.7  PLT 185 168   Cardiac Enzymes: No results for input(s): CKTOTAL, CKMB, CKMBINDEX, TROPONINI in the last 168 hours. BNP: Invalid input(s): POCBNP CBG: Recent Labs  Lab 05/09/20 0817 05/09/20 2137 05/10/20 0736  GLUCAP 209* 159* 129*   D-Dimer No results for input(s): DDIMER in the last 72 hours. Hgb A1c Recent  Labs    05/09/20 0725  HGBA1C 8.0*   Lipid Profile No results for input(s): CHOL, HDL, LDLCALC, TRIG, CHOLHDL, LDLDIRECT in the last 72 hours. Thyroid function studies Recent Labs    05/09/20 0727  TSH 3.293   Anemia work up No results for input(s): VITAMINB12, FOLATE, FERRITIN, TIBC, IRON, RETICCTPCT in the last 72 hours. Urinalysis    Component Value Date/Time   COLORURINE YELLOW 05/29/2016 1806   APPEARANCEUR CLEAR 05/29/2016 1806   LABSPEC 1.029 05/29/2016 1806   PHURINE 5.5 05/29/2016 1806   GLUCOSEU >1000 (A) 05/29/2016 1806   HGBUR NEGATIVE 05/29/2016 1806   BILIRUBINUR NEGATIVE 05/29/2016 1806   KETONESUR 15 (A) 05/29/2016 1806   PROTEINUR NEGATIVE 05/29/2016 1806   UROBILINOGEN 1.0 08/30/2007 0949   NITRITE NEGATIVE 05/29/2016 1806   LEUKOCYTESUR NEGATIVE 05/29/2016 1806   Sepsis Labs Invalid input(s): PROCALCITONIN,  WBC,  LACTICIDVEN Microbiology Recent Results (from the past 240 hour(s))  SARS Coronavirus 2 by RT PCR (hospital order, performed in [facility] hospital lab) Nasopharyngeal Nasopharyngeal Swab     Status: None   Collection Time: 05/09/20  9:11 AM   Specimen: Nasopharyngeal Swab  Result Value Ref Range Status   SARS Coronavirus 2 NEGATIVE NEGATIVE Final    Comment: (NOTE) SARS-CoV-2 target nucleic acids are NOT DETECTED.  The SARS-CoV-2 RNA is generally detectable in upper and lower respiratory specimens during the acute phase of infection. The lowest concentration of SARS-CoV-2 viral copies this assay can detect is 250 copies / mL. A negative result does not  preclude SARS-CoV-2 infection and should not be used as the sole basis for treatment or other patient management decisions.  A negative result may occur with improper specimen collection / handling, submission of specimen other than nasopharyngeal swab, presence of viral mutation(s) within the areas targeted by this assay, and inadequate number of viral copies (<250 copies / mL). A negative result must be combined with clinical observations, patient history, and epidemiological information.  Fact Sheet for Patients:   StrictlyIdeas.no  Fact Sheet for Healthcare Providers: BankingDealers.co.za  This test is not yet approved or  cleared by the [place] FDA and has been authorized for detection and/or diagnosis of SARS-CoV-2 by FDA under an Emergency Use Authorization (EUA).  This EUA will remain in effect (meaning this test can be used) for the duration of the COVID-19 declaration under Section 564([name])(1) of the Act, 21 U.S.C. section 360bbb-3([name])(1), unless the authorization is terminated or revoked sooner.  Performed at [facility], [address] [place], [place] [address]   MRSA PCR Screening     Status: None   Collection Time: 05/10/20  3:23 AM  Result Value Ref Range Status   MRSA by PCR NEGATIVE NEGATIVE Final    Comment:        The GeneXpert MRSA Assay (FDA approved for NASAL specimens only), is one component of a comprehensive MRSA colonization surveillance program. It is not intended to diagnose MRSA infection nor to guide or monitor treatment for MRSA infections. Performed at [facility], [address], [place], [place] [address]      Time coordinating discharge: 35 minutes  SIGNED:   Rodena Goldmann, DO [facility] 05/10/2020, 10:22 AM  If 7PM-7AM, please contact night-coverage [email or web address]

## 2020-05-11 ENCOUNTER — Other Ambulatory Visit (HOSPITAL_COMMUNITY): Payer: 59

## 2020-05-17 ENCOUNTER — Ambulatory Visit (HOSPITAL_COMMUNITY)
Admit: 2020-05-17 | Discharge: 2020-05-17 | Disposition: A | Discharge status: Home / Self Care | Payer: 59 | Source: Ambulatory Visit | Attending: Nurse Practitioner | Admitting: Nurse Practitioner

## 2020-05-17 ENCOUNTER — Encounter (HOSPITAL_COMMUNITY): Payer: Self-pay | Admitting: Nurse Practitioner

## 2020-05-17 ENCOUNTER — Other Ambulatory Visit: Payer: Self-pay

## 2020-05-17 VITALS — BP 100/66 | HR 59 | Ht 70.0 in | Wt 264.2 lb

## 2020-05-17 DIAGNOSIS — I480 Paroxysmal atrial fibrillation: Secondary | ICD-10-CM | POA: Insufficient documentation

## 2020-05-17 DIAGNOSIS — D6869 Other thrombophilia: Secondary | ICD-10-CM

## 2020-05-17 DIAGNOSIS — Z79899 Other long term (current) drug therapy: Secondary | ICD-10-CM | POA: Insufficient documentation

## 2020-05-17 DIAGNOSIS — E785 Hyperlipidemia, unspecified: Secondary | ICD-10-CM | POA: Diagnosis not present

## 2020-05-17 DIAGNOSIS — E118 Type 2 diabetes mellitus with unspecified complications: Secondary | ICD-10-CM | POA: Insufficient documentation

## 2020-05-17 DIAGNOSIS — I10 Essential (primary) hypertension: Secondary | ICD-10-CM | POA: Insufficient documentation

## 2020-05-17 DIAGNOSIS — I4892 Unspecified atrial flutter: Secondary | ICD-10-CM | POA: Insufficient documentation

## 2020-05-17 DIAGNOSIS — I2510 Atherosclerotic heart disease of native coronary artery without angina pectoris: Secondary | ICD-10-CM | POA: Diagnosis not present

## 2020-05-17 DIAGNOSIS — Z8249 Family history of ischemic heart disease and other diseases of the circulatory system: Secondary | ICD-10-CM | POA: Insufficient documentation

## 2020-05-17 DIAGNOSIS — K219 Gastro-esophageal reflux disease without esophagitis: Secondary | ICD-10-CM | POA: Insufficient documentation

## 2020-05-17 DIAGNOSIS — Z7901 Long term (current) use of anticoagulants: Secondary | ICD-10-CM | POA: Diagnosis not present

## 2020-05-17 DIAGNOSIS — R079 Chest pain, unspecified: Secondary | ICD-10-CM | POA: Diagnosis not present

## 2020-05-17 DIAGNOSIS — G4730 Sleep apnea, unspecified: Secondary | ICD-10-CM | POA: Diagnosis not present

## 2020-05-17 NOTE — Progress Notes (Signed)
Primary Care Physician: Glenda Chroman, MD Referring Physician: Dr. Mickeal Skinner. is a 64 y.o. male with a h/o CAD, HTN, DM,  typical atrial  flutter ablation in 2017, paroxysmal afib that has had 2 episodes of afib with RVR with  chest pain that occurs with afib, since last week. [name] He was treated in the ER 6/1 with chest pain and was in and out of afib so no changes were made. He is on metoprolol 100 mg bid and xarelto for a CHA2DS2VASc score of 4.He is now here to discuss increase in afib episodes. He also states that he has noted an increase of shortness of breath and intermittent chest tightness since February, when he returned to work, after carpel wrist surgery.He has to wear a mask at work and was thinking it was from  mask wear. However, he has to walk up a small incline on his way to work and has to slow down for the shortness of breath and mild chest discomfort, and does not have the mask on at that point. He does have untreated sleep  apnea. No alcohol, excessive caffeine or tobacco use. He is pending an appointment with Dr. Curt Bears in July. Last echo in 2017 and last cath in 2019.  F/u in afib clinic, 8/8. PT had a [facility] admission since I last saw him for afib with RVR and chest apain. HE wa on his way to work when he felt it and decided to stop by the ER. Troponin negative. Had echo with LVH with EF at 690-65%Today, I discussed Coumadin as well as novel anticoagulants including Pradaxa, Xarelto, Savaysa, and Eliquis today as indicated for risk reduction in stroke and systemic emboli with nonvalvular atrial fibrillation.  Risks, benefits, and alternatives to each of these drugs were discussed at length today. No wall motion abnormalities. IT was not flet that pt needed a stress test/cath. Cardizem 60 mg bid was added to his Toprol and no further afib. We discussed trying Multaq but he would prefer not to add any more meds and discuss with Dr. Curt Bears  front  line ablation 7/29. HE  also has an appointment with Dr. Claiborne Billings to discuss restarting his cpap as he has not used for a few years. I told him his success from ablation or antiarrythmic's  would be lowered if sleep apnea was not treated.   Today, he denies symptoms of palpitations, chest pain, shortness of breath, orthopnea, PND, lower extremity edema, dizziness, presyncope, syncope, or neurologic sequela. The patient is tolerating medications without difficulties and is otherwise without complaint today.   Past Medical History:  Diagnosis Date  . Arthritis   . Bilateral carpal tunnel syndrome 08/16/2019  . CAD (coronary artery disease)    a. LHC on 01/29/16 which revealed significant apical LAD stenosis, best treated medically. There was moderate disease in the D2 and mid nondominant RCA.[name] No PCI performed b. 03/2018: repeat cath showing regression of his LAD stenosis now being at 50% with 60% mid RCA stenosis, 45% ostial LPDA stenosis, and 45% ostial second diagonal stenosis.  . Cataract   . Diabetes mellitus    type 2  . GERD (gastroesophageal reflux disease)   . Heart disease   . Hyperlipidemia   . Hypertension   . Obesity   . PAF (paroxysmal atrial fibrillation) ([place]) 01/2016  . Sleep apnea    has CPAP machine but not wear  . Tubular adenoma of colon 08/2012  . 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 ANKLE SURGERY Left 2012   tendon repair  . CARDIAC CATHETERIZATION [name] 01/29/2016   Procedure: Left Heart Cath and Coronary Angiography;  Surgeon: [name], MD;  Location: [facility];  Service: Cardiovascular;  Laterality: [name];  . CARPAL TUNNEL RELEASE Right 09/06/2019   Procedure: CARPAL TUNNEL RELEASE;  Surgeon: Daryll Brod, MD;  Location: [facility];  Service: Orthopedics;  Laterality: Right;  . CARPAL TUNNEL RELEASE Left 10/11/2019   Procedure: LEFT CARPAL TUNNEL RELEASE;  Surgeon: Daryll Brod, MD;  Location: [facility];  Service: Orthopedics;  Laterality: Left;  IV REGIONAL FOREARM BLOCK  . CATARACT EXTRACTION W/PHACO  11/11/2012   Procedure: CATARACT EXTRACTION PHACO AND INTRAOCULAR LENS PLACEMENT (IOC);  Surgeon: [name], MD;  Location: [facility];  Service: Ophthalmology;  Laterality: Right;  CDE:  5.56  . CATARACT EXTRACTION W/PHACO Left 12/15/2013   Procedure: CATARACT EXTRACTION PHACO AND INTRAOCULAR LENS PLACEMENT (IOC);  Surgeon: [name], MD;  Location: [facility];  Service: Ophthalmology;  Laterality: Left;  CDE:13.36  . CHOLECYSTECTOMY    . CHONDROPLASTY Right 05/28/2017   Procedure: RIGHT KNEE CHONDROPLASTY;  Surgeon: [name], MD;  Location: [facility];  Service: Orthopedics;  Laterality: Right;  . COLONOSCOPY    . CYSTOSCOPY W/ URETERAL STENT PLACEMENT     right  . ELECTROPHYSIOLOGIC STUDY [name] 06/02/2016   Procedure: A-Flutter Ablation;  Surgeon: [name], MD;  Location: [facility];  Service: Cardiovascular;  Laterality: [name];  . KNEE ARTHROSCOPY WITH MEDIAL MENISECTOMY Right 05/28/2017   Procedure: KNEE ARTHROSCOPY WITH MEDIAL MENISECTOMY WITH EXTENSIVE SYNOVECTOMY;  Surgeon: [name], MD;  Location: [facility];  Service: Orthopedics;  Laterality: Right;  . LEFT HEART CATH AND CORONARY ANGIOGRAPHY [name] 03/30/2018   Procedure: LEFT HEART CATH AND CORONARY ANGIOGRAPHY;  Surgeon: Leonie Man, MD;  Location: [facility];  Service: Cardiovascular;  Laterality: [name];  . left knee sugery Left    Arthroscopy  . NASAL SINUS SURGERY    . POLYPECTOMY    . TONSILLECTOMY      Current Outpatient Medications  Medication Sig Dispense Refill  . ACCU-CHEK GUIDE test strip     . acetaminophen (TYLENOL) 325 MG tablet Take 650 mg by mouth every 6 (six) hours as needed for moderate pain or headache.     [name] atorvastatin (LIPITOR) 80 MG tablet TAKE 1 TABLET (80 MG TOTAL) BY MOUTH DAILY AT 6 PM. 90 tablet 3  . diltiazem (CARDIZEM)  60 MG tablet Take 1 tablet (60 mg total) by mouth every 12 (twelve) hours. 60 tablet 2  . furosemide (LASIX) 20 MG tablet Take 20 mg by mouth daily as needed for fluid.     [name] glimepiride (AMARYL) 4 MG tablet Take 4 mg by mouth 2 (two) times daily.     [name] HYDROcodone-acetaminophen (NORCO) 5-325 MG tablet Take 1 tablet by mouth every 6 (six) hours as needed. 20 tablet 0  . isosorbide mononitrate (IMDUR) 120 MG 24 hr tablet Take 1 tablet (120 mg total) by mouth daily. 90 tablet 3  . metoprolol succinate (TOPROL-XL) 100 MG 24 hr tablet Take 100 mg by mouth in the morning and at bedtime.     . nitroGLYCERIN (NITROSTAT) 0.4 MG SL tablet Place 1 tablet (0.4 mg total) under the tongue every 5 (five) minutes as needed  for chest pain. 25 tablet 3  . OZEMPIC, 0.25 OR 0.5 MG/DOSE, 2 MG/1.5ML SOPN Inject 0.5 mg into the skin every 7 (seven) days.    . pantoprazole (PROTONIX) 40 MG tablet Take 40 mg by mouth in the morning.     [name] SYNJARDY XR 12.03-999 MG TB24 Take 1 tablet by mouth in the morning and at bedtime.    [name] 20 MG TABS tablet TAKE 1 TABLET (20 MG TOTAL) BY MOUTH DAILY WITH SUPPER. 90 tablet 3   No current facility-administered medications for this encounter.    Allergies  Allergen Reactions  . Bee Venom Swelling  . Codeine Nausea Only    Social History   Socioeconomic History  . Marital status: Married    Spouse name: Not on file  . Number of children: Not on file  . Years of education: 32  . Highest education level: Not on file  Occupational History  . Not on file  Tobacco Use  . Smoking status: Never Smoker  . Smokeless tobacco: Never Used  Vaping Use  . Vaping Use: Never used  Substance and Sexual Activity  . Alcohol use: Yes    Alcohol/week: 0.0 standard drinks    Comment: rare occasion  . Drug use: No  . Sexual activity: Yes    Birth control/protection: None  Other Topics Concern  . Not on file  Social History Narrative   Right handed   Caffeine~ 1-2 cups per day    Lives at home with wife Shirlean Mylar    Social Determinants of Health   Financial Resource Strain:   . Difficulty of Paying Living Expenses:   Food Insecurity:   . Worried About Charity fundraiser in the Last Year:   . Arboriculturist in the Last Year:   Transportation Needs:   . Film/video editor (Medical):   [name] Lack of Transportation (Non-Medical):   Physical Activity:   . Days of Exercise per Week:   . Minutes of Exercise per Session:   Stress:   . Feeling of Stress :   Social Connections:   . Frequency of Communication with Friends and Family:   . Frequency of Social Gatherings with Friends and Family:   . Attends Religious Services:   . Active Member of Clubs or Organizations:   . Attends Archivist Meetings:   [name] Marital Status:   Intimate Partner Violence:   . Fear of Current or Ex-Partner:   . Emotionally Abused:   [name] Physically Abused:   . Sexually Abused:     Family History  Problem Relation Age of Onset  . Heart murmur Mother   . Aneurysm Mother   . Hypertension Mother   . Cirrhosis Father   . Heart attack Sister   . Diabetes Sister   . Aneurysm Maternal Grandmother   . Stroke Maternal Grandmother   . Heart attack Paternal Grandmother   . Stroke Sister   . Colon cancer Neg Hx   . Rectal cancer Neg Hx   . Stomach cancer Neg Hx     ROS- All systems are reviewed and negative except as per the HPI above  Physical Exam: Vitals:   05/17/20 1115  BP: 100/66  Pulse: (!) 59  Weight: 119.8 kg  Height: 5\' 10"  (1.778 m)   Wt Readings from Last 3 Encounters:  05/17/20 119.8 kg  05/10/20 118.9 kg  05/07/20 120.2 kg    Labs: Lab Results  Component Value Date  NA 141 05/10/2020   K 4.7 05/10/2020   CL 104 05/10/2020   CO2 28 05/10/2020   GLUCOSE 120 (H) 05/10/2020   BUN 20 05/10/2020   CREATININE 0.97 05/10/2020   CALCIUM 9.0 05/10/2020   MG 2.0 05/10/2020   Lab Results  Component Value Date   INR 1.17 01/29/2016   Lab Results    Component Value Date   CHOL 195 01/29/2016   HDL 39 (L) 01/29/2016   LDLCALC 129 (H) 01/29/2016   TRIG 133 01/29/2016     GEN- The patient is well appearing, alert and oriented x 3 today.   Head- normocephalic, atraumatic Eyes-  Sclera clear, conjunctiva pink Ears- hearing intact Oropharynx- clear Neck- supple, no JVP Lymph- no cervical lymphadenopathy Lungs- Clear to ausculation bilaterally, normal work of breathing Heart- Irregular( PAC's)  rate and rhythm, no murmurs, rubs or gallops, PMI not laterally displaced GI- soft, NT, ND, + BS Extremities- no clubbing, cyanosis, or edema MS- no significant deformity or atrophy Skin- no rash or lesion Psych- euthymic mood, full affect Neuro- strength and sensation are intact  EKG-Sinus rhythm with PAC's at 70 bpm, PR int 142 ms, qrs int 82 ms, qtc 419 ms   Cardiac cath- 5/32019-Dist LAD lesion is 50% stenosed. -Demonstrates regression of disease  Mid RCA (nondominant) stable lesion is 60% stenosed. Stable  Ost LPDA to LPDA lesion is 45% stenosed. Stable  Ost 2nd Diag lesion is 45% stenosed. Demonstrates progression of disease  The left ventricular systolic function is normal. The left ventricular ejection fraction is 55-65% by visual estimate.  LV end diastolic pressure is mildly elevated.   Angiographically stable to improved coronary disease with notably less significant disease in the diagonal branch as well as the apical LAD.  No potential culprit lesion to explain the patient's symptoms. Mildly elevated LVEDP.  Echo- 05/09/20- FINDINGS  Left Ventricle: Left ventricular ejection fraction, by estimation, is 60  to 65%. The left ventricle has normal function. The left ventricle has no  regional wall motion abnormalities. Definity contrast agent was given IV  to delineate the left ventricular  endocardial borders. The left ventricular internal cavity size was normal  in size. There is moderate left ventricular  hypertrophy. Left ventricular  diastolic parameters are indeterminate.   Assessment and Plan: 1. Paroxsymal afib  Recent up tick in afib episodes associated with chest pain Recent hospital stay for afib with RVR and 60 mg Cardizem was added bid Continue  current dose  of BB Untreated sleep apnea, appointment with Dr. Claiborne Billings pending  Discussed antiarrythmic's, 1c agent are not a good option with CAD  LV function was normal by echo, discussed trying Multaq today, he would prefer no additional meds as he feels sluggish every day  Wants to be considered for ablation   2. CAD Per Dr, Harl Bowie   3. CHA2DS2VASc score of 4  Continue xarelto 20 mg daily   4. Untreated sleep apnea Continue with appointment with Dr. Claiborne Billings 7/28 Stressed to pt importance of treating this for success of afib management   F/u with Dr. Curt Bears 7/29 and 9/9 with Dr. Lorelei Pont C. Riku Buttery, [facility] [address] [place], [place] [address] [phone]

## 2020-05-21 ENCOUNTER — Ambulatory Visit: Payer: 59 | Admitting: Family Medicine

## 2020-05-21 ENCOUNTER — Telehealth: Payer: Self-pay | Admitting: Cardiology

## 2020-05-21 NOTE — Telephone Encounter (Signed)
Defer to Afib Clinic.

## 2020-05-21 NOTE — Telephone Encounter (Signed)
New message    Patient left message on voicemail stating he needs a call back regarding his medication , it was increased and it is making him very tired

## 2020-05-22 MED ORDER — DILTIAZEM HCL ER COATED BEADS 120 MG PO CP24: 120.0000 mg | ORAL_CAPSULE | Freq: Every day | ORAL | 3 refills | Status: DC

## 2020-05-22 NOTE — Telephone Encounter (Signed)
Talked with patient since adding cardizem 60mg  BID noticed increase in fatigue after taking medications. HR staying in the 59-62 range. Discussed with Lucas Palau NP will stop cardizem 60mg  BID and start Cardizem CD 120mg  once a day at bedtime. Pt will call if symptoms do not improve with change of formulary. Pt in agreement.

## 2020-06-01 ENCOUNTER — Telehealth: Payer: Self-pay | Admitting: Cardiology

## 2020-06-01 NOTE — Telephone Encounter (Signed)
New message    Pt c/o of Chest Pain: STAT if CP now or developed within 24 hours  1. Are you having CP right now?  no  2. Are you experiencing any other symptoms (ex. SOB, nausea, vomiting, sweating)? Sob and coughing up mucus   3. How long have you been experiencing CP? It comes and goes - this happened the other day when he was laying on the couch   4. Is your CP continuous or coming and going?  Comes and goes   5. Have you taken Nitroglycerin? No -thinks he is going into afib again ?

## 2020-06-01 NOTE — Telephone Encounter (Signed)
Pt states that he has been having chest pressure for 2 weeks. Pt states that pain comes and goes. He does feel SOB at times with the pain. Pt reports that he took one nitro with relief. Please advise.

## 2020-06-04 NOTE — Telephone Encounter (Signed)
Can he see me 840 tomorrow in [place], there is an open spot. Verify its not filled before confirming him   Zandra Abts MD

## 2020-06-05 NOTE — Telephone Encounter (Signed)
I spoke with patient.He states he is not having CP now.He feels his breathing is more an issue. He says he has some chronic sinus drainage and cough.He has apt 06/12/20 at 930 am with A.Quinn, NP

## 2020-06-06 ENCOUNTER — Telehealth: Payer: Self-pay | Admitting: Cardiovascular Disease

## 2020-06-06 ENCOUNTER — Encounter: Payer: Self-pay | Admitting: Cardiovascular Disease

## 2020-06-06 ENCOUNTER — Other Ambulatory Visit: Payer: Self-pay

## 2020-06-06 ENCOUNTER — Ambulatory Visit: Payer: 59 | Admitting: Cardiovascular Disease

## 2020-06-06 VITALS — BP 102/70 | HR 60 | Ht 70.0 in | Wt 265.4 lb

## 2020-06-06 DIAGNOSIS — I480 Paroxysmal atrial fibrillation: Secondary | ICD-10-CM | POA: Diagnosis not present

## 2020-06-06 DIAGNOSIS — E118 Type 2 diabetes mellitus with unspecified complications: Secondary | ICD-10-CM

## 2020-06-06 DIAGNOSIS — Z6838 Body mass index (BMI) 38.0-38.9, adult: Secondary | ICD-10-CM

## 2020-06-06 DIAGNOSIS — I483 Typical atrial flutter: Secondary | ICD-10-CM

## 2020-06-06 DIAGNOSIS — I10 Essential (primary) hypertension: Secondary | ICD-10-CM | POA: Diagnosis not present

## 2020-06-06 DIAGNOSIS — G4733 Obstructive sleep apnea (adult) (pediatric): Secondary | ICD-10-CM | POA: Diagnosis not present

## 2020-06-06 DIAGNOSIS — I2510 Atherosclerotic heart disease of native coronary artery without angina pectoris: Secondary | ICD-10-CM | POA: Diagnosis not present

## 2020-06-06 NOTE — Telephone Encounter (Signed)
Patient is requesting to speak with Dr. Evette Georges nurse because he has additional questions regarding CPAP machine, as discussed during appointment today, 06/06/20 at 10:20 AM.

## 2020-06-06 NOTE — Patient Instructions (Signed)
Medication Instructions:  CONTINUE WITH CURRENT MEDICATIONS. NO CHANGES.  *If you need a refill on your cardiac medications before your next appointment, please call your pharmacy*  Follow-Up: At [facility], you and your health needs are our priority.  As part of our continuing mission to provide you with exceptional heart care, we have created designated Provider Care Teams.  These Care Teams include your primary Cardiologist (physician) and Advanced Practice Providers (APPs -  Physician Assistants and Nurse Practitioners) who all work together to provide you with the care you need, when you need it.  We recommend signing up for the patient portal called "MyChart".  Sign up information is provided on this After Visit Summary.  MyChart is used to connect with patients for Virtual Visits (Telemedicine).  Patients are able to view lab/test results, encounter notes, upcoming appointments, etc.  Non-urgent messages can be sent to your provider as well.   To learn more about what you can do with MyChart, go to [email or web address].    Your next appointment:   3 month(s)  The format for your next appointment:   In Person  Provider:   Shelva Majestic, MD   Other Instructions [employer]med Airfit [id number] full face mask Go to [place] to link your machine to [facility]

## 2020-06-06 NOTE — Progress Notes (Signed)
Cardiology Office Note    Date:  06/08/2020   ID:  Lucas [name]., DOB [birth year], MRN [id number]  PCP:  Glenda Chroman, MD  Cardiologist:  Shelva Majestic, MD (sleep): Dr. Carlyle Dolly.   New sleep evaluation  History of Present Illness:  Lucas [name]. is a 64 y.o. male who is followed by Dr. Carlyle Dolly for general cardiology care.  He has a history of sleep apnea originally diagnosed in 2017 but has not been therapy for several years.  He now presents for new sleep evaluation.  [name] has a history of CAD, hypertension, diabetes mellitus, as well as atrial flutter who had undergone atrial flutter ablation in 2017.  He also recently has developed paroxysmal atrial fibrillation with RVR.  Reportedly, in 2017 he had undergone a sleep study which was interpreted by Dr. Merlene Laughter in [place].  I was able to obtain a copy of that report from April 20, 2016 which showed mild overall sleep apnea with an AHI of 10.9; however, sleep apnea was very severe during REM sleep with an AHI of 48.2.  He had significant oxygen desaturation to a nadir of 75%.  There was moderate snoring.  He does not recall ever seeing Dr. Merlene Laughter in follow-up but was told to initiate AutoPap therapy at a range of 5-15.  The results of the sleep study were never explained to him.  He apparently had received a ResMed air sense 10 CPAP auto machine.  He had gotten his supplies with [employer].  He apparently has not used CPAP in several years.  He has developed episodes of recurrent atrial fibrillation his RPR.  His most recent echo Doppler study has shown normal systolic function with LVH and normal estimated RV systolic pressure.  EF was 60 to 65%.  He was recently evaluated in atrial fibrillation clinic by Roderic Palau and now presents to see me today for new sleep evaluation and discussion regarding the possibility of reinstitution of CPAP therapy.  Presently, he admits to snoring.  He does have  daytime sleepiness, frequent awakenings, nocturia several times per night.  An Epworth Sleepiness Scale score was calculated in the office today which endorsed as shown below and is consistent with excessive daytime sleepiness.   Epworth Sleepiness Scale: Situation   Chance of Dozing/Sleeping (0 = never , 1 = slight chance , 2 = moderate chance , 3 = high chance )   sitting and reading 2   watching TV 3   sitting inactive in a public place 2   being a passenger in a motor vehicle for an hour or more 2   lying down in the afternoon 2   sitting and talking to someone 0   sitting quietly after lunch (no alcohol) 1   while stopped for a few minutes in traffic as the driver 1   Total Score  13   Presently he denies any recurrent anginal symptoms.  He has been on diltiazem 120 mg, metoprolol succinate 100 mg in addition to isosorbide 120 mg and furosemide 20 mg for his PAF as well as hypertension and CAD.  He is diabetic on Synjardy XR and glimepiride.  He is on atorvastatin 80 mg for hyperlipidemia.  He has been anticoagulated with Xarelto with a CHA2DS2-VASc score is 4.  He presents for initial evaluation.    Past Medical History:  Diagnosis Date  . Arthritis   . Bilateral carpal tunnel syndrome 08/16/2019  . CAD (coronary artery  disease)    a. LHC on 01/29/16 which revealed significant apical LAD stenosis, best treated medically. There was moderate disease in the D2 and mid nondominant RCA.[name] No PCI performed b. 03/2018: repeat cath showing regression of his LAD stenosis now being at 50% with 60% mid RCA stenosis, 45% ostial LPDA stenosis, and 45% ostial second diagonal stenosis.  . Cataract   . Diabetes mellitus    type 2  . GERD (gastroesophageal reflux disease)   . Heart disease   . Hyperlipidemia   . Hypertension   . Obesity   . PAF (paroxysmal atrial fibrillation) ([place]) 01/2016  . Sleep apnea    has CPAP machine but not wear  . Tubular adenoma of colon 08/2012  . 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 ANKLE SURGERY Left 2012   tendon repair  . CARDIAC CATHETERIZATION [name] 01/29/2016   Procedure: Left Heart Cath and Coronary Angiography;  Surgeon: [name], MD;  Location: [name];  Service: Cardiovascular;  Laterality: [name];  . CARPAL TUNNEL RELEASE Right 09/06/2019   Procedure: CARPAL TUNNEL RELEASE;  Surgeon: Daryll Brod, MD;  Location: [facility];  Service: Orthopedics;  Laterality: Right;  . CARPAL TUNNEL RELEASE Left 10/11/2019   Procedure: LEFT CARPAL TUNNEL RELEASE;  Surgeon: Daryll Brod, MD;  Location: [facility];  Service: Orthopedics;  Laterality: Left;  IV REGIONAL FOREARM BLOCK  . CATARACT EXTRACTION W/PHACO  11/11/2012   Procedure: CATARACT EXTRACTION PHACO AND INTRAOCULAR LENS PLACEMENT (IOC);  Surgeon: [name], MD;  Location: [facility];  Service: Ophthalmology;  Laterality: Right;  CDE:  5.56  . CATARACT EXTRACTION W/PHACO Left 12/15/2013   Procedure: CATARACT EXTRACTION PHACO AND INTRAOCULAR LENS PLACEMENT (IOC);  Surgeon: [name], MD;  Location: [facility];  Service: Ophthalmology;  Laterality: Left;  CDE:13.36  . CHOLECYSTECTOMY    . CHONDROPLASTY Right 05/28/2017   Procedure: RIGHT KNEE CHONDROPLASTY;  Surgeon: [name], MD;  Location: [facility];  Service: Orthopedics;  Laterality: Right;  . COLONOSCOPY    . CYSTOSCOPY W/ URETERAL STENT PLACEMENT     right  . ELECTROPHYSIOLOGIC STUDY [name] 06/02/2016   Procedure: A-Flutter Ablation;  Surgeon: [name], MD;  Location: [facility];  Service: Cardiovascular;  Laterality: [name];  . KNEE ARTHROSCOPY WITH MEDIAL MENISECTOMY Right 05/28/2017   Procedure: KNEE ARTHROSCOPY WITH MEDIAL MENISECTOMY WITH EXTENSIVE SYNOVECTOMY;  Surgeon: [name], MD;  Location: [facility];  Service: Orthopedics;  Laterality: Right;  . LEFT HEART CATH AND CORONARY  ANGIOGRAPHY [name] 03/30/2018   Procedure: LEFT HEART CATH AND CORONARY ANGIOGRAPHY;  Surgeon: Leonie Man, MD;  Location: [facility];  Service: Cardiovascular;  Laterality: [name];  . left knee sugery Left    Arthroscopy  . NASAL SINUS SURGERY    . POLYPECTOMY    . TONSILLECTOMY      Current Medications: Outpatient Medications Prior to Visit  Medication Sig Dispense Refill  . ACCU-CHEK GUIDE test strip     . acetaminophen (TYLENOL) 325 MG tablet Take 650 mg by mouth every 6 (six) hours as needed for moderate pain or headache.     [name] atorvastatin (LIPITOR) 80 MG tablet TAKE 1 TABLET (80 MG TOTAL) BY MOUTH DAILY AT 6 PM. 90 tablet 3  . diltiazem (CARDIZEM CD) 120 MG 24 hr capsule Take 1 capsule (120 mg total) by mouth  at bedtime. 30 capsule 3  . furosemide (LASIX) 20 MG tablet Take 20 mg by mouth daily as needed for fluid.     [name] glimepiride (AMARYL) 4 MG tablet Take 4 mg by mouth 2 (two) times daily.     [name] HYDROcodone-acetaminophen (NORCO) 5-325 MG tablet Take 1 tablet by mouth every 6 (six) hours as needed. 20 tablet 0  . isosorbide mononitrate (IMDUR) 120 MG 24 hr tablet Take 1 tablet (120 mg total) by mouth daily. 90 tablet 3  . metoprolol succinate (TOPROL-XL) 100 MG 24 hr tablet Take 100 mg by mouth in the morning and at bedtime.     . nitroGLYCERIN (NITROSTAT) 0.4 MG SL tablet Place 1 tablet (0.4 mg total) under the tongue every 5 (five) minutes as needed for chest pain. 25 tablet 3  . OZEMPIC, 0.25 OR 0.5 MG/DOSE, 2 MG/1.5ML SOPN Inject 0.5 mg into the skin every 7 (seven) days.    . pantoprazole (PROTONIX) 40 MG tablet Take 40 mg by mouth in the morning.     [name] SYNJARDY XR 12.03-999 MG TB24 Take 1 tablet by mouth in the morning and at bedtime.    [name] 20 MG TABS tablet TAKE 1 TABLET (20 MG TOTAL) BY MOUTH DAILY WITH SUPPER. 90 tablet 3   No facility-administered medications prior to visit.     Allergies:   Bee venom and Codeine   Social History   Socioeconomic  History  . Marital status: Married    Spouse name: Not on file  . Number of children: Not on file  . Years of education: 22  . Highest education level: Not on file  Occupational History  . Not on file  Tobacco Use  . Smoking status: Never Smoker  . Smokeless tobacco: Never Used  Vaping Use  . Vaping Use: Never used  Substance and Sexual Activity  . Alcohol use: Yes    Alcohol/week: 0.0 standard drinks    Comment: rare occasion  . Drug use: No  . Sexual activity: Yes    Birth control/protection: None  Other Topics Concern  . Not on file  Social History Narrative   Right handed   Caffeine~ 1-2 cups per day   Lives at home with wife Shirlean Mylar    Social Determinants of Health   Financial Resource Strain:   . Difficulty of Paying Living Expenses:   Food Insecurity:   . Worried About Charity fundraiser in the Last Year:   . Arboriculturist in the Last Year:   Transportation Needs:   . Film/video editor (Medical):   [name] Lack of Transportation (Non-Medical):   Physical Activity:   . Days of Exercise per Week:   . Minutes of Exercise per Session:   Stress:   . Feeling of Stress :   Social Connections:   . Frequency of Communication with Friends and Family:   . Frequency of Social Gatherings with Friends and Family:   . Attends Religious Services:   . Active Member of Clubs or Organizations:   . Attends Archivist Meetings:   [name] Marital Status:     Socially he was born in [place].  He works for [employer] currently on [employer].  He is married for 35 years.  There are no children.  Family History:  The patient's family history includes Aneurysm in his maternal grandmother and mother; Cirrhosis in his father; Diabetes in his sister; Heart attack in his paternal grandmother and  sister; Heart murmur in his mother; Hypertension in his mother; Stroke in his maternal grandmother and sister.  Both parents are deceased, mother at age 31, father age 26.  He has  4 sisters.  ROS General: Negative; No fevers, chills, or night sweats;  HEENT: Negative; No changes in vision or hearing, sinus congestion, difficulty swallowing Pulmonary: Negative; No cough, wheezing, shortness of breath, hemoptysis Cardiovascular: Positive for CAD, hypertension, atrial flutter ablation, and PAF GI: Negative; No nausea, vomiting, diarrhea, or abdominal pain GU: Negative; No dysuria, hematuria, or difficulty voiding Musculoskeletal: Negative; no myalgias, joint pain, or weakness Hematologic/Oncology: Negative; no easy bruising, bleeding Endocrine: Negative; no heat/cold intolerance; no diabetes Neuro: Negative; no changes in balance, headaches Skin: Negative; No rashes or skin lesions Psychiatric: Negative; No behavioral problems, depression Sleep: Positive for snoring,  daytime sleepiness, hypersomnolence,; nobruxism, restless legs, hypnogognic hallucinations, no cataplexy Other comprehensive 14 point system review is negative.   PHYSICAL EXAM:   VS:  BP 102/70 (BP Location: Left Arm, Patient Position: Sitting, Cuff Size: Large)   Pulse 60   Ht _0  (1.778 m)   Wt (!) 265 lb 6.4 oz (120.4 kg)   BMI 38.08 kg/m     Repeat blood pressure by me 116/70  Wt Readings from Last 3 Encounters:  06/07/20 (!) 266 lb (120.7 kg)  06/06/20 (!) 265 lb 6.4 oz (120.4 kg)  05/17/20 264 lb 3.2 oz (119.8 kg)    General: Alert, oriented, no distress.  Moderate obesity Skin: normal turgor, no rashes, warm and dry HEENT: Normocephalic, atraumatic. Pupils equal round and reactive to light; sclera anicteric; extraocular muscles intact; Nose without nasal septal hypertrophy Mouth/Parynx benign; Mallinpatti scale 3/4 Neck: No JVD, no carotid bruits; normal carotid upstroke Lungs: clear to ausculatation and percussion; no wheezing or rales Chest wall: without tenderness to palpitation Heart: PMI not displaced, RRR, s1 s2 normal, 1/6 systolic murmur, no diastolic murmur, no rubs,  gallops, thrills, or heaves Abdomen: Moderate central adiposity soft, nontender; no hepatosplenomehaly, BS+; abdominal aorta nontender and not dilated by palpation. Back: no CVA tenderness Pulses 2+ Musculoskeletal: full range of motion, normal strength, no joint deformities Extremities: no clubbing cyanosis or edema, Homan's sign negative  Neurologic: grossly nonfocal; Cranial nerves grossly wnl Psychologic: Normal mood and affect   Studies/Labs Reviewed:   06/07/2020 ECG (independently read by me): Sinus rhythm at 69 bpm with PACs, QTc interval 431 ms.  Recent Labs: BMP Latest Ref Rng & Units 06/07/2020 05/10/2020 05/09/2020  Glucose 65 - 99 mg/dL 161(H) 120(H) 284(H)  BUN 8 - 27 mg/dL _1 Creatinine 0.76 - 1.27 mg/dL 0.99 0.97 0.94  BUN/Creat Ratio 10 - 24 13 - -  Sodium 134 - 144 mmol/L 137 141 136  Potassium 3.5 - 5.2 mmol/L 4.4 4.7 4.1  Chloride 96 - 106 mmol/L 100 104 103  CO2 20 - 29 mmol/L _2 Calcium 8.6 - 10.2 mg/dL 9.1 9.0 9.0     Hepatic Function Latest Ref Rng & Units 05/09/2020 05/30/2016 02/12/2016  Total Protein 6.5 - 8.1 g/dL 6.9 6.6 6.8  Albumin 3.5 - 5.0 g/dL 3.9 3.6 3.7  AST 15 - 41 U/L 32 30 30  ALT 0 - 44 U/L 35 34 31  Alk Phosphatase 38 - 126 U/L 125 84 90  Total Bilirubin 0.3 - 1.2 mg/dL 1.0 1.2 1.1  Bilirubin, Direct 0.0 - 0.2 mg/dL 0.2 0.2 -    CBC Latest Ref Rng & Units 05/10/2020 05/09/2020 04/10/2020  WBC 4.0 - 10.5 K/uL 8.6 9.8 8.9  Hemoglobin 13.0 - 17.0 g/dL 14.8 16.3 14.9  Hematocrit 39 - 52 % 45.1 47.7 44.9  Platelets 150 - 400 K/uL 168 185 157   Lab Results  Component Value Date   MCV 94.7 05/10/2020   MCV 93.0 05/09/2020   MCV 92.4 04/10/2020   Lab Results  Component Value Date   TSH 3.293 05/09/2020   Lab Results  Component Value Date   HGBA1C 8.0 (H) 05/09/2020     BNP    Component Value Date/Time   BNP 148.0 (H) 05/09/2020 0727    ProBNP No results found for: PROBNP   Lipid Panel     Component Value  Date/Time   CHOL 195 01/29/2016 0109   TRIG 133 01/29/2016 0109   HDL 39 (L) 01/29/2016 0109   CHOLHDL 5.0 01/29/2016 0109   VLDL 27 01/29/2016 0109   LDLCALC 129 (H) 01/29/2016 0109     RADIOLOGY: DG Chest Portable 1 View  Result Date: 05/09/2020 CLINICAL DATA:  Chest pain and arrhythmia. EXAM: PORTABLE CHEST 1 VIEW COMPARISON:  04/10/2020 FINDINGS: Artifact overlies the chest. Lordotic positioning. Heart size is normal. Chronic linear scar in the left perihilar lung. The pulmonary vascularity is normal. No edema. The lungs are otherwise clear. No effusions. No significant bone finding. IMPRESSION: Overlying artifact. Lordotic positioning. No active disease allowing for that. Electronically Signed   By: Nelson Chimes M.D.   On: 05/09/2020 08:06   ECHOCARDIOGRAM COMPLETE  Result Date: 05/09/2020    ECHOCARDIOGRAM REPORT   Patient Name:   Alder Murri. Date of Exam: 05/09/2020 Medical Rec #:  [id number]         Height:       70.0 in Accession #:    [id number]        Weight:       265.0 lb Date of Birth:  Jul 06, 1956         BSA:          2.353 m Patient Age:    78 years          BP:           110/64 mmHg Patient Gender: M                 HR:           58 bpm. Exam Location:  [facility] Procedure: 2D Echo and Intracardiac Opacification Agent Indications:    Chest Pain 786.50 / R07.9  History:        Patient has prior history of Echocardiogram examinations, most                 recent 10/08/2019. CAD, Arrythmias:Atrial Flutter and Atrial                 Fibrillation; Risk Factors:Hypertension, Dyslipidemia,                 Non-Smoker and Diabetes. Elevated Troponin.  Sonographer:    Leavy Cella RDCS (AE) Referring Phys: [id number] [place]  1. Left ventricular ejection fraction, by estimation, is 60 to 65%. The left ventricle has normal function. The left ventricle has no regional wall motion abnormalities. There is moderate left ventricular hypertrophy. Left ventricular diastolic  parameters are indeterminate.  2. Right ventricular systolic function is normal. The right ventricular size is normal. There is normal pulmonary artery systolic pressure.  3. The mitral valve is normal in structure. No evidence  of mitral valve regurgitation. No evidence of mitral stenosis.  4. The aortic valve is tricuspid. Aortic valve regurgitation is not visualized. No aortic stenosis is present.  5. The inferior vena cava is normal in size with greater than 50% respiratory variability, suggesting right atrial pressure of 3 mmHg. FINDINGS  Left Ventricle: Left ventricular ejection fraction, by estimation, is 60 to 65%. The left ventricle has normal function. The left ventricle has no regional wall motion abnormalities. Definity contrast agent was given IV to delineate the left ventricular  endocardial borders. The left ventricular internal cavity size was normal in size. There is moderate left ventricular hypertrophy. Left ventricular diastolic parameters are indeterminate. Right Ventricle: The right ventricular size is normal. No increase in right ventricular wall thickness. Right ventricular systolic function is normal. There is normal pulmonary artery systolic pressure. The tricuspid regurgitant velocity is 1.97 m/s, and  with an assumed right atrial pressure of 10 mmHg, the estimated right ventricular systolic pressure is 25.0 mmHg. Left Atrium: Left atrial size was normal in size. Right Atrium: Right atrial size was normal in size. Pericardium: There is no evidence of pericardial effusion. Mitral Valve: The mitral valve is normal in structure. No evidence of mitral valve regurgitation. No evidence of mitral valve stenosis. Tricuspid Valve: The tricuspid valve is not well visualized. Tricuspid valve regurgitation is not demonstrated. No evidence of tricuspid stenosis. Aortic Valve: The aortic valve is tricuspid. Aortic valve regurgitation is not visualized. No aortic stenosis is present. Pulmonic Valve: The  pulmonic valve was not well visualized. Pulmonic valve regurgitation is not visualized. No evidence of pulmonic stenosis. Aorta: The aortic root is normal in size and structure. Pulmonary Artery: Indeterminant PASP, inadequate TR jet. Venous: The inferior vena cava is normal in size with greater than 50% respiratory variability, suggesting right atrial pressure of 3 mmHg. IAS/Shunts: No atrial level shunt detected by color flow Doppler.  LEFT VENTRICLE PLAX 2D LVIDd:         4.92 cm  Diastology LVIDs:         2.36 cm  LV e' lateral:   7.94 cm/s LV PW:         1.31 cm  LV E/e' lateral: 9.6 LV IVS:        1.24 cm  LV e' medial:    6.09 cm/s LVOT diam:     2.00 cm  LV E/e' medial:  12.5 LVOT Area:     3.14 cm  RIGHT VENTRICLE RV S prime:     10.80 cm/s TAPSE (M-mode): 2.6 cm LEFT ATRIUM             Index       RIGHT ATRIUM           Index LA diam:        3.20 cm 1.36 cm/m  RA Area:     14.60 cm LA Vol (A2C):   41.8 ml 17.77 ml/m RA Volume:   41.90 ml  17.81 ml/m LA Vol (A4C):   46.2 ml 19.64 ml/m LA Biplane Vol: 48.2 ml 20.49 ml/m   AORTA Ao Root diam: 3.30 cm MITRAL VALVE               TRICUSPID VALVE MV Area (PHT): 2.80 cm    TR Peak grad:   15.5 mmHg MV Decel Time: 271 msec    TR Vmax:        197.00 cm/s MV E velocity: 76.20 cm/s MV A velocity: 42.70 cm/s  SHUNTS MV  E/A ratio:  1.78        Systemic Diam: 2.00 cm Carlyle Dolly MD Electronically signed by Carlyle Dolly MD Signature Date/Time: 05/09/2020/7:05:39 PM    Final      Additional studies/ records that were reviewed today include:  I personally reviewed the diagnostic polysomnogram dated 12/22/2015 and interpreted by Dr. Merlene Laughter. Records of Dr. Harl Bowie, and AF clinic were reviewed   ASSESSMENT:    1. OSA (obstructive sleep apnea)   2. Paroxysmal atrial fibrillation (HCC)   3. Coronary artery disease involving native coronary artery of native heart without angina pectoris   4. Essential hypertension   5. Typical atrial flutter [facility]):  s/p ablation   6. Type 2 diabetes mellitus with complication, without long-term current use of insulin (HCC)   7. Class 2 severe obesity due to excess calories with serious comorbidity and body mass index (BMI) of 38.0 to 38.9 in adult [facility])     PLAN:  Mr. Dewie Ahart is a 64 year old gentleman who has significant cardiovascular comorbidities including CAD, hypertension, diabetes mellitus, typical atrial flutter status post ablation, and who has experienced recurrent episodes of paroxysmal atrial fibrillation with RVR.  He is on anticoagulation with his CHA2DS2-VASc score of 4.  He has a history of moderate obesity current BMI is 38.  He had undergone an initial sleep study in 2017.  I personally reviewed this today.  He tells me that he never had seen a physician following the sleep study but was just notified of the results of his started on AutoPap therapy.  Upon review of the sleep study, he had mild sleep apnea overall with an AHI of 10.9.  He had severe sleep apnea during REM sleep with an AHI of 48.2 and there was significant oxygen desaturation to a nadir of 75%.  There was moderate snoring.  He apparently had used CPAP for short duration when he got supplies from [employer].  I spent considerable time with the patient today reviewing his sleep study and discussing with him at length potential adverse cardiovascular consequences associated with untreated sleep apnea.  I discussed its effect on hypertension, nocturnal arrhythmias with increased risk for atrial fibrillation as well as recurrent atrial fibrillation following cardioversion if sleep apnea remains untreated.  We also discussed its potential nocturnal hypoxemic effects including potential coronary ischemia with underlying CAD as well as cerebrovascular ischemia.  I discussed its implications with reference to glucose metabolism as well as GERD.  Presently he does snore.  He has frequent nocturia.  He has daytime sleepiness.  He  brought his machine with him today.  He has not used it in several years.  Upon interrogation by me it appears to be set at a rate of 5 up to 15 with an EPR of 3.  He apparently had previously used it.  If needed.  Assessment new mask technology and I believe he would benefit from the AirFit F30i mask.  I have recommended that he go to [employer] with his machine.  They will need to obtain the serial number as well as the device number and then they will be able to move his unit to our office so that we may monitor his treatment.  Presently, his blood pressure is controlled on his current regimen.  He is maintaining sinus rhythm.  I reviewed the ECG that was done by Dr. Curt Bears the day after this office visit which showed sinus rhythm with PACs.  We discussed the importance of weight loss  and exercise particularly with his BMI of 38 and also the effects on sleep apnea.  He continues to be on atorvastatin 80 mg for hyperlipidemia.  He is diabetic on Synjardy XR and glimepiride.  He is anticoagulated with Xarelto.  I will see him in 3 months for follow-up evaluation and further recommendations will be made at that time.  Time spent: 40 minutes Medication Adjustments/Labs and Tests Ordered: Current medicines are reviewed at length with the patient today.  Concerns regarding medicines are outlined above.  Medication changes, Labs and Tests ordered today are listed in the Patient Instructions below. Patient Instructions  Medication Instructions:  CONTINUE WITH CURRENT MEDICATIONS. NO CHANGES.  *If you need a refill on your cardiac medications before your next appointment, please call your pharmacy*  Follow-Up: At [facility], you and your health needs are our priority.  As part of our continuing mission to provide you with exceptional heart care, we have created designated Provider Care Teams.  These Care Teams include your primary Cardiologist (physician) and Advanced Practice Providers (APPs -   Physician Assistants and Nurse Practitioners) who all work together to provide you with the care you need, when you need it.  We recommend signing up for the patient portal called "MyChart".  Sign up information is provided on this After Visit Summary.  MyChart is used to connect with patients for Virtual Visits (Telemedicine).  Patients are able to view lab/test results, encounter notes, upcoming appointments, etc.  Non-urgent messages can be sent to your provider as well.   To learn more about what you can do with MyChart, go to [email or web address].    Your next appointment:   3 month(s)  The format for your next appointment:   In Person  Provider:   Shelva Majestic, MD   Other Instructions Resmed Airfit F30i full face mask Go to [place] to link your machine to [facility]    Signed, Shelva Majestic, MD  06/08/2020 6:51 AM    [address] [phone] [address] [place], [place], [name]  [address] Phone: [phone]

## 2020-06-07 ENCOUNTER — Ambulatory Visit: Payer: 59 | Admitting: Cardiology

## 2020-06-07 ENCOUNTER — Encounter: Payer: Self-pay | Admitting: Cardiology

## 2020-06-07 ENCOUNTER — Other Ambulatory Visit: Payer: Self-pay | Admitting: Cardiovascular Disease

## 2020-06-07 VITALS — BP 120/72 | HR 66 | Ht 70.0 in | Wt 266.0 lb

## 2020-06-07 DIAGNOSIS — I480 Paroxysmal atrial fibrillation: Secondary | ICD-10-CM | POA: Diagnosis not present

## 2020-06-07 DIAGNOSIS — G4733 Obstructive sleep apnea (adult) (pediatric): Secondary | ICD-10-CM

## 2020-06-07 DIAGNOSIS — I2510 Atherosclerotic heart disease of native coronary artery without angina pectoris: Secondary | ICD-10-CM | POA: Diagnosis not present

## 2020-06-07 DIAGNOSIS — R079 Chest pain, unspecified: Secondary | ICD-10-CM | POA: Diagnosis not present

## 2020-06-07 DIAGNOSIS — I4891 Unspecified atrial fibrillation: Secondary | ICD-10-CM | POA: Diagnosis not present

## 2020-06-07 LAB — BASIC METABOLIC PANEL
BUN/Creatinine Ratio: 13 (ref 10–24)
BUN: 13 mg/dL (ref 8–27)
CO2: 20 mmol/L (ref 20–29)
Calcium: 9.1 mg/dL (ref 8.6–10.2)
Chloride: 100 mmol/L (ref 96–106)
Creatinine, Ser: 0.99 mg/dL (ref 0.76–1.27)
GFR calc Af Amer: 92 mL/min/{1.73_m2} (ref >59)
GFR calc non Af Amer: 80 mL/min/{1.73_m2} (ref >59)
Glucose: 161 mg/dL — ABNORMAL HIGH (ref 65–99)
Potassium: 4.4 mmol/L (ref 3.5–5.2)
Sodium: 137 mmol/L (ref 134–144)

## 2020-06-07 MED ORDER — EMPAGLIFLOZIN 25 MG PO TABS
25.0000 mg | ORAL_TABLET | Freq: Every day | ORAL | 0 refills | Status: DC
Start: 2020-06-07 — End: 2020-07-23

## 2020-06-07 NOTE — Telephone Encounter (Signed)
-----   Message from Darlyn Chamber June, RN sent at 06/06/2020 11:55 AM EDT ----- Regarding: New mask Pt uses [employer] and his machine needs to be linked. They will be going today to have that done but they also need a new mask ResMed AirFit F30i mask. Pt not sure on what size he wears and would like to discuss this with you. Thank  you!

## 2020-06-07 NOTE — Telephone Encounter (Signed)
Returned a call to patient. He informs me that he uses [facility] instead of [facility]. He would like to be changed to [place] if possible. Patient was informed that if I don't have all of the documentation he will need to get another sleep study. He will stay with [facility] if he has to. I tod patient I will call him back again later today or tomorrow to let him know which place will be servicing him.

## 2020-06-07 NOTE — Progress Notes (Signed)
Electrophysiology Office Note   Date:  06/07/2020   ID:  Lucas [name]., DOB 1956-06-09, MRN [id number]  PCP:  Glenda Chroman, MD  Cardiologist: Carlyle Dolly Primary Electrophysiologist:  Olivya Sobol Meredith Leeds, MD    Chief Complaint: AF   History of Present Illness: Lucas [name]. is a 64 y.o. male who is being seen today for the evaluation of AF at the request of Jerene Bears B, MD. Presenting today for electrophysiology evaluation.  He has a history significant for coronary artery disease, hypertension, diabetes, typical atrial flutter status post ablation in 2017, and paroxysmal atrial fibrillation.  He was seen in emergency room 04/10/2020 with chest pain was found to be in and out of atrial fibrillation.  He is currently on metoprolol and Xarelto.  He has been having increasing shortness of breath and chest tightness since February.  He gets short of breath when walking up small inclines.  He also has sleep apnea that is untreated.  Today, he denies symptoms of palpitations, chest pain, shortness of breath, orthopnea, PND, lower extremity edema, claudication, dizziness, presyncope, syncope, bleeding, or neurologic sequela. The patient is tolerating medications without difficulties. He has severe weakness, fatigue, and shortness of breath when he is in atrial fibrillation. He is presented to the emergency room a few times due to rapid episodes of atrial fibrillation. He has started the diltiazem which has improved his symptoms overall. He is taking it on a daily basis instead of twice daily. Otherwise, he does have some chest discomfort. His chest hurts in the center without radiation. He says that it does bother him when he exerts himself. He says that it is not necessarily related to his atrial fibrillation. He does have a history of coronary artery disease.   Past Medical History:  Diagnosis Date  . Arthritis   . Bilateral carpal tunnel syndrome 08/16/2019  . CAD (coronary artery  disease)    a. LHC on 01/29/16 which revealed significant apical LAD stenosis, best treated medically. There was moderate disease in the D2 and mid nondominant RCA.[name] No PCI performed b. 03/2018: repeat cath showing regression of his LAD stenosis now being at 50% with 60% mid RCA stenosis, 45% ostial LPDA stenosis, and 45% ostial second diagonal stenosis.  . Cataract   . Diabetes mellitus    type 2  . GERD (gastroesophageal reflux disease)   . Heart disease   . Hyperlipidemia   . Hypertension   . Obesity   . PAF (paroxysmal atrial fibrillation) ([place]) 01/2016  . Sleep apnea    has CPAP machine but not wear  . Tubular adenoma of colon 08/2012  . 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 ANKLE SURGERY Left 2012   tendon repair  . CARDIAC CATHETERIZATION [name] 01/29/2016   Procedure: Left Heart Cath and Coronary Angiography;  Surgeon: [name], MD;  Location: [facility];  Service: Cardiovascular;  Laterality: [name];  . CARPAL TUNNEL RELEASE Right 09/06/2019   Procedure: CARPAL TUNNEL RELEASE;  Surgeon: Daryll Brod, MD;  Location: [facility];  Service: Orthopedics;  Laterality: Right;  . CARPAL TUNNEL RELEASE Left 10/11/2019   Procedure: LEFT CARPAL TUNNEL RELEASE;  Surgeon: Daryll Brod, MD;  Location: [facility];  Service: Orthopedics;  Laterality: Left;  IV REGIONAL FOREARM BLOCK  . CATARACT EXTRACTION W/PHACO  11/11/2012   Procedure: CATARACT EXTRACTION PHACO AND INTRAOCULAR LENS PLACEMENT (IOC);  Surgeon: [name], MD;  Location: [facility];  Service: Ophthalmology;  Laterality: Right;  CDE:  5.56  . CATARACT EXTRACTION W/PHACO Left 12/15/2013   Procedure: CATARACT EXTRACTION PHACO AND INTRAOCULAR LENS PLACEMENT (IOC);  Surgeon: [name], MD;  Location: [facility];  Service: Ophthalmology;  Laterality: Left;  CDE:13.36  . CHOLECYSTECTOMY    . CHONDROPLASTY Right 05/28/2017   Procedure: RIGHT KNEE  CHONDROPLASTY;  Surgeon: [name], MD;  Location: [facility];  Service: Orthopedics;  Laterality: Right;  . COLONOSCOPY    . CYSTOSCOPY W/ URETERAL STENT PLACEMENT     right  . ELECTROPHYSIOLOGIC STUDY [name] 06/02/2016   Procedure: A-Flutter Ablation;  Surgeon: Taariq Leitz Meredith Leeds, MD;  Location: [facility];  Service: Cardiovascular;  Laterality: [name];  . KNEE ARTHROSCOPY WITH MEDIAL MENISECTOMY Right 05/28/2017   Procedure: KNEE ARTHROSCOPY WITH MEDIAL MENISECTOMY WITH EXTENSIVE SYNOVECTOMY;  Surgeon: [name], MD;  Location: [facility];  Service: Orthopedics;  Laterality: Right;  . LEFT HEART CATH AND CORONARY ANGIOGRAPHY [name] 03/30/2018   Procedure: LEFT HEART CATH AND CORONARY ANGIOGRAPHY;  Surgeon: Leonie Man, MD;  Location: [facility];  Service: Cardiovascular;  Laterality: [name];  . left knee sugery Left    Arthroscopy  . NASAL SINUS SURGERY    . POLYPECTOMY    . TONSILLECTOMY       Current Outpatient Medications  Medication Sig Dispense Refill  . ACCU-CHEK GUIDE test strip     . acetaminophen (TYLENOL) 325 MG tablet Take 650 mg by mouth every 6 (six) hours as needed for moderate pain or headache.     [name] atorvastatin (LIPITOR) 80 MG tablet TAKE 1 TABLET (80 MG TOTAL) BY MOUTH DAILY AT 6 PM. 90 tablet 3  . diltiazem (CARDIZEM CD) 120 MG 24 hr capsule Take 1 capsule (120 mg total) by mouth at bedtime. 30 capsule 3  . furosemide (LASIX) 20 MG tablet Take 20 mg by mouth daily as needed for fluid.     [name] glimepiride (AMARYL) 4 MG tablet Take 4 mg by mouth 2 (two) times daily.     [name] HYDROcodone-acetaminophen (NORCO) 5-325 MG tablet Take 1 tablet by mouth every 6 (six) hours as needed. 20 tablet 0  . isosorbide mononitrate (IMDUR) 120 MG 24 hr tablet Take 1 tablet (120 mg total) by mouth daily. 90 tablet 3  . metoprolol succinate (TOPROL-XL) 100 MG 24 hr tablet Take 100 mg by mouth in the morning and at bedtime.     . nitroGLYCERIN  (NITROSTAT) 0.4 MG SL tablet Place 1 tablet (0.4 mg total) under the tongue every 5 (five) minutes as needed for chest pain. 25 tablet 3  . OZEMPIC, 0.25 OR 0.5 MG/DOSE, 2 MG/1.5ML SOPN Inject 0.5 mg into the skin every 7 (seven) days.    . pantoprazole (PROTONIX) 40 MG tablet Take 40 mg by mouth in the morning.     [name] SYNJARDY XR 12.03-999 MG TB24 Take 1 tablet by mouth in the morning and at bedtime.    [name] 20 MG TABS tablet TAKE 1 TABLET (20 MG TOTAL) BY MOUTH DAILY WITH SUPPER. 90 tablet 3   No current facility-administered medications for this visit.    Allergies:   Bee venom and Codeine   Social History:  The patient  reports that he has never smoked. He has never used smokeless tobacco. He reports current alcohol use. He reports that he does not use drugs.   Family  History:  The patient's family history includes Aneurysm in his maternal grandmother and mother; Cirrhosis in his father; Diabetes in his sister; Heart attack in his paternal grandmother and sister; Heart murmur in his mother; Hypertension in his mother; Stroke in his maternal grandmother and sister.    ROS:  Please see the history of present illness.   Otherwise, review of systems is positive for none.   All other systems are reviewed and negative.    PHYSICAL EXAM: VS:  BP 120/72   Pulse 66   Ht 5\' 10"  (1.778 m)   Wt (!) 266 lb (120.7 kg)   SpO2 96%   BMI 38.17 kg/m  , BMI Body mass index is 38.17 kg/m. GEN: Well nourished, well developed, in no acute distress  HEENT: normal  Neck: no JVD, carotid bruits, or masses Cardiac: RRR; no murmurs, rubs, or gallops,no edema  Respiratory:  clear to auscultation bilaterally, normal work of breathing GI: soft, nontender, nondistended, + BS MS: no deformity or atrophy  Skin: warm and dry Neuro:  Strength and sensation are intact Psych: euthymic mood, full affect  EKG:  EKG is ordered today. Personal review of the ekg ordered shows sinus rhythm, atrial bigeminy,  rate 66  Recent Labs: 05/09/2020: ALT 35; B Natriuretic Peptide 148.0; TSH 3.293 05/10/2020: BUN 20; Creatinine, Ser 0.97; Hemoglobin 14.8; Magnesium 2.0; Platelets 168; Potassium 4.7; Sodium 141    Lipid Panel     Component Value Date/Time   CHOL 195 01/29/2016 0109   TRIG 133 01/29/2016 0109   HDL 39 (L) 01/29/2016 0109   CHOLHDL 5.0 01/29/2016 0109   VLDL 27 01/29/2016 0109   LDLCALC 129 (H) 01/29/2016 0109     Wt Readings from Last 3 Encounters:  06/07/20 (!) 266 lb (120.7 kg)  06/06/20 (!) 265 lb 6.4 oz (120.4 kg)  05/17/20 264 lb 3.2 oz (119.8 kg)      Other studies Reviewed: Additional studies/ records that were reviewed today include: TTE 05/09/20  Review of the above records today demonstrates:  1. Left ventricular ejection fraction, by estimation, is 60 to 65%. The  left ventricle has normal function. The left ventricle has no regional  wall motion abnormalities. There is moderate left ventricular hypertrophy.  Left ventricular diastolic  parameters are indeterminate.  2. Right ventricular systolic function is normal. The right ventricular  size is normal. There is normal pulmonary artery systolic pressure.  3. The mitral valve is normal in structure. No evidence of mitral valve  regurgitation. No evidence of mitral stenosis.  4. The aortic valve is tricuspid. Aortic valve regurgitation is not  visualized. No aortic stenosis is present.  5. The inferior vena cava is normal in size with greater than 50%  respiratory variability, suggesting right atrial pressure of 3 mmHg.    ASSESSMENT AND PLAN:  1.  Paroxysmal atrial fibrillation: Has had an increase in his episodes over the last few months.  Currently on Xarelto and metoprolol with a CHA2DS2-VASc of 4. He has had further episodes of atrial fibrillation. We Damiean Lukes thus plan for ablation. Risks and benefits were discussed which could bleeding, tamponade, heart block, stroke, damage to chest organs. He understands  these risks and has agreed to the procedure.  2.  Coronary artery disease: Has continued to have chest pain. Due to that, and the somewhat typical nature, Zeniya Lapidus plan for coronary CT. His left atrium can be evaluated on the CT at the same time. If he does have significant coronary artery disease,  left heart catheterization prior to ablation may be warranted.  3.  Obstructive sleep apnea: CPAP compliance encouraged. Had follow-up with Dr. Claiborne Billings yesterday.  Case discussed with primary cardiology  Current medicines are reviewed at length with the patient today.   The patient does not have concerns regarding his medicines.  The following changes were made today:  none  Labs/ tests ordered today include:  Orders Placed This Encounter  Procedures  . CT CORONARY MORPH W/CTA COR W/SCORE W/CA W/CM &/OR WO/CM  . CT CORONARY FRACTIONAL FLOW RESERVE DATA PREP  . CT CORONARY FRACTIONAL FLOW RESERVE FLUID ANALYSIS  . Basic Metabolic Panel (BMET)  . EKG 12-Lead     Disposition:   FU with Ahna Konkle 3 months  Signed, Nahdia Doucet Meredith Leeds, MD  06/07/2020 10:33 AM     [facility] [address] [place]  [place] [address] [phone] (office) [phone] (fax)

## 2020-06-07 NOTE — Patient Instructions (Addendum)
Medication Instructions:  Your physician recommends that you continue on your current medications as directed. Please refer to the Current Medication list given to you today.  *If you need a refill on your cardiac medications before your next appointment, please call your pharmacy*   Lab Work: TODAY - basic metabolic panel If you have labs (blood work) drawn today and your tests are completely normal, you will receive your results only by: [name] MyChart Message (if you have MyChart) OR . A paper copy in the mail If you have any lab test that is abnormal or we need to change your treatment, we will call you to review the results.   Testing/Procedures: Your cardiac CT will be scheduled at one of the below locations:   [facility], [place] [address] [place], [place] [address] [phone]  [facility] [address] [place], [place] [address] [phone]  If scheduled at [facility], please arrive at the [facility] main entrance of [facility] 30 minutes prior to test start time. Proceed to the [facility] Radiology Department (first floor) to check-in and test prep.  If scheduled at [facility], please arrive 15 mins early for check-in and test prep.  Please follow these instructions carefully (unless otherwise directed):  Hold all erectile dysfunction medications at least 3 days (72 hrs) prior to test.  On the Night Before the Test: . Be sure to Drink plenty of water. . Do not consume any caffeinated/decaffeinated beverages or chocolate 12 hours prior to your test. . Do not take any antihistamines 12 hours prior to your test. . If the patient has contrast allergy: ? Patient will need a prescription for Prednisone and very clear instructions (as follows): 1. Prednisone 50 mg - take 13 hours prior to test 2. Take another Prednisone 50 mg 7 hours prior to test 3. Take another  Prednisone 50 mg 1 hour prior to test 4. Take Benadryl 50 mg 1 hour prior to test . Patient must complete all four doses of above prophylactic medications. . Patient will need a ride after test due to Benadryl.  On the Day of the Test: . Drink plenty of water. Do not drink any water within one hour of the test. . Do not eat any food 4 hours prior to the test. . You may take your regular medications prior to the test.  . Take metoprolol (Lopressor) two hours prior to test. . HOLD Furosemide morning of the test.        After the Test: . Drink plenty of water. . After receiving IV contrast, you may experience a mild flushed feeling. This is normal. . On occasion, you may experience a mild rash up to 24 hours after the test. This is not dangerous. If this occurs, you can take Benadryl 25 mg and increase your fluid intake. . If you experience trouble breathing, this can be serious. If it is severe call 911 IMMEDIATELY. If it is mild, please call our office. . If you take any of these medications: Glipizide/Metformin, Avandament, Glucavance, please do not take 48 hours after completing test unless otherwise instructed.   Once we have confirmed authorization from your insurance company, we will call you to set up a date and time for your test. Based on how quickly your insurance processes prior authorizations requests, please allow up to 4 weeks to be contacted for scheduling your Cardiac CT appointment. Be advised that routine  Cardiac CT appointments could be scheduled as many as 8 weeks after your provider has ordered it.  For non-scheduling related questions, please contact the cardiac imaging nurse navigator should you have any questions/concerns: Marchia Bond, Cardiac Imaging Nurse Navigator Burley Saver, Interim Cardiac Imaging Nurse [place] [facility] Direct Office Dial: [phone]   For scheduling needs, including cancellations and rescheduling, please call  Vivien Rota at [phone], option 3.      Follow-Up: At [facility], you and your health needs are our priority.  As part of our continuing mission to provide you with exceptional heart care, we have created designated Provider Care Teams.  These Care Teams include your primary Cardiologist (physician) and Advanced Practice Providers (APPs -  Physician Assistants and Nurse Practitioners) who all work together to provide you with the care you need, when you need it.  Follow-up: to be determined based on test and procedure dates   Other Instructions September Ablation dates are: 9/3 9/15 9/16 9/20 9/22 9/29

## 2020-06-08 ENCOUNTER — Encounter: Payer: Self-pay | Admitting: Cardiovascular Disease

## 2020-06-08 NOTE — Telephone Encounter (Signed)
Patient is calling to follow up with Lucas [name] regarding which place will be servicing him. Please return call with updates.

## 2020-06-08 NOTE — Telephone Encounter (Signed)
Mariann Laster is off this afternoon - message routed to her to review upon return

## 2020-06-11 NOTE — Progress Notes (Signed)
Cardiology Office Note  Date: 06/13/2020   ID: Lucas Dirocco., DOB 03-16-1956, MRN [id number]  PCP:  Lucas Chroman, MD  Cardiologist:  Lucas Dolly, MD Electrophysiologist:  None   Chief Complaint: Atrial fibrillation, CAD, HTN, DM  History of Present Illness: Lucas [name]. is a 64 y.o. male with a history of CAD,HTN,DM, typical atrial flutter s/p ablation 2017, PAF.   Seen in ED 04/10/2020 for CP and found to be in and out  atrial fibrillation. SOB and chest tightness since February. On metoprolol and Xarelto. SOB when walking up inclines. Hx of untreated OSA. Severe weakness and fatigue when in atrial fibrillation, Had started Cardizem which improved his symptoms. Only taking it daily instead if twice daily. Having chest discomfort. Hurts in center without radiation. Worse with exertion. Not necessarily r/t atrial fibrillation.  Presented to the emergency room on 05/09/2020 with tachycardia and palpitations as well as mild chest tightness earlier in the morning.  Chest tightness was nonradiating.  And not associated with activity.  He was noted to be hypoglycemic.  Chest x-ray ordered negative, troponins were negative, BNP was 148, TSH 3.29.  EKG showed A. fib with RVR at 147.  Given Cardizem bolus and drip started.  Eventually rate was controlled.  Case was discussed with Lucas. Harl [name].  Recommended using his home diltiazem 60 mg p.o. twice daily and continue his usual home dose of Toprol.  Advised to hold benazepril.  2D echo showed LVEF of 55 to 60% stable compared to her prior echo 2017.  Saw Lucas [name] 06/07/2020. Increased episodes of a. afib over the last few months. Plan for ablation. Coronary CT ordered d/t continued chest pain. If significant CAD on CT cardiac catheterization prior to ablation may be warranted. CPAP compliance was encouraged.   He is here for recent complaints of chest pain over the last 3 weeks. States he can have chest pain at rest or with exertion. He denies  any radiation to the neck, arm, back, or jaw. Denies any nausea, vomiting, or diaphoresis during the episodes. States his chest sometimes feels sore. He states there are no aggravating or alleviating factors. Denies any associated dizziness, orthostatic/syncopal or near syncopal symptoms. No bleeding issues. Denies claudication symptoms, DVT or PE-like symptoms, or lower extremity edema. Is currently on max dose of Imdur at 120 mg daily. States he has taken up to 2 sublingual nitroglycerin with eventual relief recently.   Past Medical History:  Diagnosis Date  . Arthritis   . Bilateral carpal tunnel syndrome 08/16/2019  . CAD (coronary artery disease)    a. LHC on 01/29/16 which revealed significant apical LAD stenosis, best treated medically. There was moderate disease in the D2 and mid nondominant RCA.[name] No PCI performed b. 03/2018: repeat cath showing regression of his LAD stenosis now being at 50% with 60% mid RCA stenosis, 45% ostial LPDA stenosis, and 45% ostial second diagonal stenosis.  . Cataract   . Diabetes mellitus    type 2  . GERD (gastroesophageal reflux disease)   . Heart disease   . Hyperlipidemia   . Hypertension   . Obesity   . PAF (paroxysmal atrial fibrillation) ([place]) 01/2016  . Sleep apnea    has CPAP machine but not wear  . Tubular adenoma of colon 08/2012  . 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 ANKLE SURGERY Left 2012   tendon repair  .  CARDIAC CATHETERIZATION [name] 01/29/2016   Procedure: Left Heart Cath and Coronary Angiography;  Surgeon: Lucas Crome, MD;  Location: [facility];  Service: Cardiovascular;  Laterality: [name];  . CARPAL TUNNEL RELEASE Right 09/06/2019   Procedure: CARPAL TUNNEL RELEASE;  Surgeon: Lucas Brod, MD;  Location: [facility];  Service: Orthopedics;  Laterality: Right;  . CARPAL TUNNEL RELEASE Left 10/11/2019   Procedure: LEFT CARPAL TUNNEL RELEASE;   Surgeon: Lucas Brod, MD;  Location: [facility];  Service: Orthopedics;  Laterality: Left;  IV REGIONAL FOREARM BLOCK  . CATARACT EXTRACTION W/PHACO  11/11/2012   Procedure: CATARACT EXTRACTION PHACO AND INTRAOCULAR LENS PLACEMENT (IOC);  Surgeon: Lucas Branch, MD;  Location: [facility];  Service: Ophthalmology;  Laterality: Right;  CDE:  5.56  . CATARACT EXTRACTION W/PHACO Left 12/15/2013   Procedure: CATARACT EXTRACTION PHACO AND INTRAOCULAR LENS PLACEMENT (IOC);  Surgeon: Lucas Branch, MD;  Location: [facility];  Service: Ophthalmology;  Laterality: Left;  CDE:13.36  . CHOLECYSTECTOMY    . CHONDROPLASTY Right 05/28/2017   Procedure: RIGHT KNEE CHONDROPLASTY;  Surgeon: Lucas Lights, MD;  Location: [facility];  Service: Orthopedics;  Laterality: Right;  . COLONOSCOPY    . CYSTOSCOPY W/ URETERAL STENT PLACEMENT     right  . ELECTROPHYSIOLOGIC STUDY [name] 06/02/2016   Procedure: A-Flutter Ablation;  Surgeon: Lucas Meredith Leeds, MD;  Location: [facility];  Service: Cardiovascular;  Laterality: [name];  . KNEE ARTHROSCOPY WITH MEDIAL MENISECTOMY Right 05/28/2017   Procedure: KNEE ARTHROSCOPY WITH MEDIAL MENISECTOMY WITH EXTENSIVE SYNOVECTOMY;  Surgeon: Lucas Lights, MD;  Location: [facility];  Service: Orthopedics;  Laterality: Right;  . LEFT HEART CATH AND CORONARY ANGIOGRAPHY [name] 03/30/2018   Procedure: LEFT HEART CATH AND CORONARY ANGIOGRAPHY;  Surgeon: Lucas Man, MD;  Location: [facility];  Service: Cardiovascular;  Laterality: [name];  . left knee sugery Left    Arthroscopy  . NASAL SINUS SURGERY    . POLYPECTOMY    . TONSILLECTOMY      Current Outpatient Medications  Medication Sig Dispense Refill  . ACCU-CHEK GUIDE test strip     . acetaminophen (TYLENOL) 325 MG tablet Take 650 mg by mouth every 6 (six) hours as needed for moderate pain or headache.     [name] atorvastatin (LIPITOR) 80 MG tablet TAKE 1 TABLET (80 MG TOTAL) BY MOUTH DAILY AT  6 PM. 90 tablet 3  . diltiazem (CARDIZEM CD) 120 MG 24 hr capsule Take 1 capsule (120 mg total) by mouth at bedtime. 30 capsule 3  . empagliflozin (JARDIANCE) 25 MG TABS tablet Take 1 tablet (25 mg total) by mouth daily before breakfast. Take 1 tablet daily on the day of and the 2 days after your coronary CT 3 tablet 0  . furosemide (LASIX) 20 MG tablet Take 20 mg by mouth daily as needed for fluid.     [name] glimepiride (AMARYL) 4 MG tablet Take 4 mg by mouth 2 (two) times daily.     [name] HYDROcodone-acetaminophen (NORCO) 5-325 MG tablet Take 1 tablet by mouth every 6 (six) hours as needed. 20 tablet 0  . isosorbide mononitrate (IMDUR) 120 MG 24 hr tablet Take 1 tablet (120 mg total) by mouth daily. 90 tablet 3  . metoprolol succinate (TOPROL-XL) 100 MG 24 hr tablet Take 100 mg by mouth in the morning and at bedtime.     . nitroGLYCERIN (NITROSTAT) 0.4 MG SL tablet Place 1 tablet (0.4  mg total) under the tongue every 5 (five) minutes as needed for chest pain. 25 tablet 3  . OZEMPIC, 0.25 OR 0.5 MG/DOSE, 2 MG/1.5ML SOPN Inject 0.5 mg into the skin every 7 (seven) days.    . pantoprazole (PROTONIX) 40 MG tablet Take 40 mg by mouth in the morning.     [name] SYNJARDY XR 12.03-999 MG TB24 Take 1 tablet by mouth in the morning and at bedtime.    [name] 20 MG TABS tablet TAKE 1 TABLET (20 MG TOTAL) BY MOUTH DAILY WITH SUPPER. 90 tablet 3  . amLODipine (NORVASC) 2.5 MG tablet Take 1 tablet (2.5 mg total) by mouth daily. 30 tablet 6   No current facility-administered medications for this visit.   Allergies:  Bee venom and Codeine   Social History: The patient  reports that he has never smoked. He has never used smokeless tobacco. He reports current alcohol use. He reports that he does not use drugs.   Family History: The patient's family history includes Aneurysm in his maternal grandmother and mother; Cirrhosis in his father; Diabetes in his sister; Heart attack in his paternal grandmother and sister; Heart  murmur in his mother; Hypertension in his mother; Stroke in his maternal grandmother and sister.   ROS:  Please see the history of present illness. Otherwise, complete review of systems is positive for none.  All other systems are reviewed and negative.   Physical Exam: VS:  BP 114/72   Pulse 61   Ht 5\' 10"  (1.778 m)   Wt 268 lb (121.6 kg)   SpO2 97%   BMI 38.45 kg/m , BMI Body mass index is 38.45 kg/m.  Wt Readings from Last 3 Encounters:  06/12/20 268 lb (121.6 kg)  06/07/20 (!) 266 lb (120.7 kg)  06/06/20 (!) 265 lb 6.4 oz (120.4 kg)    General: Patient appears comfortable at rest. Neck: Supple, no elevated JVP or carotid bruits, no thyromegaly. Lungs: Clear to auscultation, nonlabored breathing at rest. Cardiac: Regular rate and rhythm, no S3 or significant systolic murmur, no pericardial rub. Extremities: No pitting edema, distal pulses 2+. Skin: Warm and dry. Musculoskeletal: No kyphosis. Neuropsychiatric: Alert and oriented x3, affect grossly appropriate.  ECG:   &/29/2021 EKG with PACs rate of 66  Recent Labwork: 05/09/2020: ALT 35; AST 32; B Natriuretic Peptide 148.0; TSH 3.293 05/10/2020: Hemoglobin 14.8; Magnesium 2.0; Platelets 168 06/07/2020: BUN 13; Creatinine, Ser 0.99; Potassium 4.4; Sodium 137     Component Value Date/Time   CHOL 195 01/29/2016 0109   TRIG 133 01/29/2016 0109   HDL 39 (L) 01/29/2016 0109   CHOLHDL 5.0 01/29/2016 0109   VLDL 27 01/29/2016 0109   LDLCALC 129 (H) 01/29/2016 0109    Other Studies Reviewed Today:   TTE 05/09/20  Review of the above records today demonstrates:  1. Left ventricular ejection fraction, by estimation, is 60 to 65%. The  left ventricle has normal function. The left ventricle has no regional  wall motion abnormalities. There is moderate left ventricular hypertrophy.  Left ventricular diastolic  parameters are indeterminate.  2. Right ventricular systolic function is normal. The right ventricular  size is  normal. There is normal pulmonary artery systolic pressure.  3. The mitral valve is normal in structure. No evidence of mitral valve  regurgitation. No evidence of mitral stenosis.  4. The aortic valve is tricuspid. Aortic valve regurgitation is not  visualized. No aortic stenosis is present.  5. The inferior vena cava is normal in size  with greater than 50%  respiratory variability, suggesting right atrial pressure of 3 mmHg.   Assessment and Plan:  1. Paroxysmal atrial fibrillation (HCC)   2. CAD in native artery   3. OSA (obstructive sleep apnea)   4. Chest pain, unspecified type    1. Paroxysmal atrial fibrillation ([place]) Plan for ablation by Lucas [name] during recent office visit.  CHA2DS2-VASc score of 4.  2. CAD in native artery Coronary CT ordered by Lucas. Curt [name]. He has continuing chest pain.  Lucas. Curt [name] mentioned the atrium could be evaluated on CT at the same time.  Mentioned if CT was abnormal patient may need a left heart catheterization prior to ablation.  3. OSA (obstructive sleep apnea) Compliance was encouraged.  Patient recently had a follow-up with Lucas. Claiborne Billings  4.Chest pain He complains of chest pain with and without activity. No radiation or associated symptoms. He states it sometimes feels like a soreness. He states at work the chest pain can occur and he has to stop what he is doing. He is taking 120 mg of Imdur and continues to have chest pain. He has a pending coronary CT ordered by Lucas. Curt [name]. Add Amlodipine 2.5 mg daily pending Coronary CT results.  Medication Adjustments/Labs and Tests Ordered: Current medicines are reviewed at length with the patient today.  Concerns regarding medicines are outlined above.   Disposition: Follow-up with Lucas [name] or APP  Signed, Levell July, NP 06/13/2020 11:00 AM    [facility] [place], [place], [place] [address] Phone: [phone]; Fax: [phone]

## 2020-06-12 ENCOUNTER — Telehealth: Payer: Self-pay

## 2020-06-12 ENCOUNTER — Encounter: Payer: Self-pay | Admitting: Family Medicine

## 2020-06-12 ENCOUNTER — Ambulatory Visit: Payer: 59 | Admitting: Family Medicine

## 2020-06-12 VITALS — BP 114/72 | HR 61 | Ht 70.0 in | Wt 268.0 lb

## 2020-06-12 DIAGNOSIS — I2510 Atherosclerotic heart disease of native coronary artery without angina pectoris: Secondary | ICD-10-CM | POA: Diagnosis not present

## 2020-06-12 DIAGNOSIS — G4733 Obstructive sleep apnea (adult) (pediatric): Secondary | ICD-10-CM | POA: Diagnosis not present

## 2020-06-12 DIAGNOSIS — I480 Paroxysmal atrial fibrillation: Secondary | ICD-10-CM

## 2020-06-12 DIAGNOSIS — R079 Chest pain, unspecified: Secondary | ICD-10-CM | POA: Diagnosis not present

## 2020-06-12 MED ORDER — AMLODIPINE BESYLATE 2.5 MG PO TABS
2.5000 mg | ORAL_TABLET | Freq: Every day | ORAL | 6 refills | Status: DC
Start: 2020-06-12 — End: 2020-07-23

## 2020-06-12 NOTE — Telephone Encounter (Signed)
-----   Message from Emmaline Life, RN sent at 06/07/2020 10:54 AM EDT ----- Curt Bears has ordered coronary ct and then pt will need a fib ablation. I went ahead and got bmet today in hopes that it would be close enough to the ct date. I gave them the sept ablation dates so they could be prepared to schedule based on when ct is done.

## 2020-06-12 NOTE — Patient Instructions (Addendum)
Medication Instructions:   Begin Amlodipine 2.5mg daily.  Continue all other medications.    Labwork: none  Testing/Procedures: none   Follow-Up: 6 months   Any Other Special Instructions Will Be Listed Below (If Applicable).  If you need a refill on your cardiac medications before your next appointment, please call your pharmacy.  

## 2020-06-13 ENCOUNTER — Encounter: Payer: Self-pay | Admitting: Family Medicine

## 2020-06-13 NOTE — Telephone Encounter (Signed)
Follow Up  Patient is calling in to follow up about cpap machine. Please give patient a call back.

## 2020-06-18 ENCOUNTER — Telehealth: Payer: Self-pay | Admitting: Cardiology

## 2020-06-18 ENCOUNTER — Ambulatory Visit: Payer: 59 | Admitting: Cardiology

## 2020-06-18 ENCOUNTER — Telehealth: Payer: Self-pay | Admitting: *Deleted

## 2020-06-18 NOTE — Telephone Encounter (Signed)
"  [place]   Patient c/o Palpitations:  High priority if patient c/o lightheadedness, shortness of breath, or chest pain  1) How long have you had palpitations/irregular HR/ Afib? Are you having the symptoms now?               Since 06/15/2020   2) Are you currently experiencing lightheadedness, SOB or CP? Some chest pains off and on when he is in A.Fib   3) Do you have a history of afib (atrial fibrillation) or irregular heart rhythm? yes  4) Have you checked your BP or HR? (document readings if available): 157/92 P 67 just now   5) Are you experiencing any other symptoms? No

## 2020-06-18 NOTE — Telephone Encounter (Signed)
Order  for F-30i mask sent to [employer]. I do not have all of the needed documentation to change patient to [facility].

## 2020-06-18 NOTE — Telephone Encounter (Signed)
1. Forms received: from fax 2. Received on: 06/18/2020 3. Completed patient auth attached- currently do not have signed release form.   Took form to MD box for completion.    HEPeters  06/18/2020

## 2020-06-19 MED ORDER — DILTIAZEM HCL ER COATED BEADS 180 MG PO CP24
180.0000 mg | ORAL_CAPSULE | Freq: Every day | ORAL | 1 refills | Status: DC
Start: 2020-06-19 — End: 2020-07-04

## 2020-06-19 NOTE — Telephone Encounter (Signed)
Per Dr Harl Bowie pt will increase diltiazem 180 mg daily and update [place] if this helps with symptoms

## 2020-06-19 NOTE — Addendum Note (Signed)
Addended by: Julian Hy T on: 06/19/2020 02:27 PM   Modules accepted: Orders

## 2020-06-19 NOTE — Telephone Encounter (Signed)
Can this patient increase his diltiazem to 180mg  daily based on his phone call from yesterday

## 2020-06-22 ENCOUNTER — Telehealth: Payer: Self-pay | Admitting: *Deleted

## 2020-06-22 ENCOUNTER — Telehealth: Payer: Self-pay | Admitting: Cardiology

## 2020-06-22 NOTE — Telephone Encounter (Signed)
Reached out to patient to offer assistance with getting him set up with [facility]. Then I reached out to [place] to add him to [facility] and to make sure his mask order had been received. [facility] says they need the patients insurance information to fill the order. Patient was asked to call the resupply team at [phone] to update his information to get future supplies. Pt is agreeable to treatment.

## 2020-06-22 NOTE — Telephone Encounter (Signed)
Forms from [employer] received on 06/21/20. No patient Josem Kaufmann provided. Auth mailed to patient 06/21/20. 06/22/20 vlm

## 2020-06-29 ENCOUNTER — Telehealth: Payer: Self-pay | Admitting: Cardiology

## 2020-06-29 NOTE — Telephone Encounter (Signed)
Pt states he had an afib episode last night. He took meds.    Please call [phone]   Thanks renee

## 2020-06-29 NOTE — Telephone Encounter (Signed)
Pt was watching tv at 3 am and apple watch alerted pt he was in AFib. He states that he chest did feel "funny" at that time. He denies being SOB. Heart rate 120's-150's according to his watch. At that time he took diltiazem 30 mg and waited and hour and took another one.  The took morning meds at 6 am. Heart rate at that time was in the 120's. Current heart rate it 67. No chest discomfort at this time.  Pt states that he started having more trouble with his Afib after getting the Covid shot. Please advise.

## 2020-07-03 NOTE — Telephone Encounter (Signed)
Spoke with pt who states that he is having cardiac CT prior to having ablation. Pt will continue to monitor.

## 2020-07-03 NOTE — Telephone Encounter (Signed)
Has he heart back about when his ablation is planned for, this is the long term solution. With the meds we are just trying to control things best we can until then   Zandra Abts MD

## 2020-07-04 ENCOUNTER — Telehealth (HOSPITAL_COMMUNITY): Payer: Self-pay | Admitting: Emergency Medicine

## 2020-07-04 ENCOUNTER — Encounter (HOSPITAL_COMMUNITY): Payer: Self-pay

## 2020-07-04 ENCOUNTER — Other Ambulatory Visit: Payer: Self-pay

## 2020-07-04 ENCOUNTER — Emergency Department (HOSPITAL_COMMUNITY): Payer: 59

## 2020-07-04 ENCOUNTER — Emergency Department (HOSPITAL_COMMUNITY)
Admission: EM | Admit: 2020-07-04 | Discharge: 2020-07-04 | Disposition: A | Discharge status: Home / Self Care | Payer: 59 | Attending: Emergency Medicine | Admitting: Emergency Medicine

## 2020-07-04 DIAGNOSIS — Z79899 Other long term (current) drug therapy: Secondary | ICD-10-CM | POA: Insufficient documentation

## 2020-07-04 DIAGNOSIS — Z7984 Long term (current) use of oral hypoglycemic drugs: Secondary | ICD-10-CM | POA: Diagnosis not present

## 2020-07-04 DIAGNOSIS — R55 Syncope and collapse: Secondary | ICD-10-CM | POA: Diagnosis not present

## 2020-07-04 DIAGNOSIS — Z7901 Long term (current) use of anticoagulants: Secondary | ICD-10-CM | POA: Insufficient documentation

## 2020-07-04 DIAGNOSIS — I2510 Atherosclerotic heart disease of native coronary artery without angina pectoris: Secondary | ICD-10-CM | POA: Insufficient documentation

## 2020-07-04 DIAGNOSIS — I10 Essential (primary) hypertension: Secondary | ICD-10-CM | POA: Diagnosis not present

## 2020-07-04 DIAGNOSIS — I4811 Longstanding persistent atrial fibrillation: Secondary | ICD-10-CM | POA: Diagnosis not present

## 2020-07-04 DIAGNOSIS — I4892 Unspecified atrial flutter: Secondary | ICD-10-CM | POA: Diagnosis not present

## 2020-07-04 DIAGNOSIS — R0602 Shortness of breath: Secondary | ICD-10-CM | POA: Diagnosis not present

## 2020-07-04 DIAGNOSIS — Z955 Presence of coronary angioplasty implant and graft: Secondary | ICD-10-CM | POA: Insufficient documentation

## 2020-07-04 DIAGNOSIS — E119 Type 2 diabetes mellitus without complications: Secondary | ICD-10-CM | POA: Diagnosis not present

## 2020-07-04 LAB — URINALYSIS, ROUTINE W REFLEX MICROSCOPIC
Bacteria, UA: NONE SEEN
Bilirubin Urine: NEGATIVE
Glucose, UA: >=500 mg/dL — AB
Hgb urine dipstick: NEGATIVE
Ketones, ur: 5 mg/dL — AB
Leukocytes,Ua: NEGATIVE
Nitrite: NEGATIVE
Protein, ur: NEGATIVE mg/dL
Specific Gravity, Urine: 1.032 — ABNORMAL HIGH (ref 1.005–1.030)
pH: 5 (ref 5.0–8.0)

## 2020-07-04 LAB — CBC
HCT: 47.4 % (ref 39.0–52.0)
Hemoglobin: 15.9 g/dL (ref 13.0–17.0)
MCH: 30.9 pg (ref 26.0–34.0)
MCHC: 33.5 g/dL (ref 30.0–36.0)
MCV: 92.2 fL (ref 80.0–100.0)
Platelets: 201 10*3/uL (ref 150–400)
RBC: 5.14 MIL/uL (ref 4.22–5.81)
RDW: 14 % (ref 11.5–15.5)
WBC: 10.7 10*3/uL — ABNORMAL HIGH (ref 4.0–10.5)
nRBC: 0 % (ref 0.0–0.2)

## 2020-07-04 LAB — BASIC METABOLIC PANEL
Anion gap: 10 (ref 5–15)
BUN: 15 mg/dL (ref 8–23)
CO2: 24 mmol/L (ref 22–32)
Calcium: 8.9 mg/dL (ref 8.9–10.3)
Chloride: 103 mmol/L (ref 98–111)
Creatinine, Ser: 0.98 mg/dL (ref 0.61–1.24)
GFR calc Af Amer: >60 mL/min (ref >60)
GFR calc non Af Amer: >60 mL/min (ref >60)
Glucose, Bld: 232 mg/dL — ABNORMAL HIGH (ref 70–99)
Potassium: 4.4 mmol/L (ref 3.5–5.1)
Sodium: 137 mmol/L (ref 135–145)

## 2020-07-04 LAB — MAGNESIUM: Magnesium: 1.7 mg/dL (ref 1.7–2.4)

## 2020-07-04 LAB — TSH: TSH: 4.434 u[IU]/mL (ref 0.350–4.500)

## 2020-07-04 LAB — CBG MONITORING, ED: Glucose-Capillary: 235 mg/dL — ABNORMAL HIGH (ref 70–99)

## 2020-07-04 LAB — TROPONIN I (HIGH SENSITIVITY): Troponin I (High Sensitivity): 4 ng/L (ref <18)

## 2020-07-04 MED ORDER — DILTIAZEM HCL ER COATED BEADS 240 MG PO CP24: 240.0000 mg | ORAL_CAPSULE | Freq: Every day | ORAL | 0 refills | Status: DC

## 2020-07-04 NOTE — Progress Notes (Signed)
Patient discussed with ER staff. Long history of PAF that has been difficult to control, seeing EP as outpatient with plans for afib ablation in the near future. Have been trying to control his symptoms leading up to that with av nodal agents. Currently on dilt long acting 180mg  daily, toprol 100mg  bid, takes short acting dilt 30mg  prn. Presented with recurrent palpitations. In ER PAF, mild rats low 100s. No indication for admission.   Would stop his norvasc. Increase his long acting diltiazem to 240mg  daily. Continue his toprol and prn diltiazem, continue follow up with EP for plans of ablation in the near future. [place] for discharge from ER.    Carlyle Dolly MD

## 2020-07-04 NOTE — ED Triage Notes (Signed)
Pt reports history of a fib and reports heart racing since last night.  Reports was in the shower this morning and had a near syncopal episode.  Reports has an appt Fri for a cardiac ct.  HR 65-108 with ems, afib on monitor, cbg 223.  bp 90/50 pta.  Reports placed iv and started fluid bolus.

## 2020-07-04 NOTE — Telephone Encounter (Signed)
Reaching out to patient to offer assistance regarding upcoming cardiac imaging study; pt verbalizes understanding of appt date/time, parking situation and where to check in, pre-test NPO status and medications ordered, and verified current allergies; name and call back number provided for further questions should they arise Daniela Siebers RN Navigator Cardiac Imaging [facility] [phone] office [phone] cell 

## 2020-07-04 NOTE — ED Provider Notes (Addendum)
[facility] EMERGENCY DEPARTMENT Provider Note   CSN: [id number] Arrival date & time: 07/04/20  4158     History Chief Complaint  Patient presents with  . Near Syncope    Lucas [name]. is a 64 y.o. male with past medical history significant for coronary artery disease, A. fib with RVR on Xarelto, diabetes, hypertension, sleep apnea presenting today with episode of presyncope that occurred while he was in the shower.  Patient reports that he has had multiple episodes of dizziness and mild shortness of breath that occur when his heart is racing.  Patient has been followed closely with cardiology recently.  He reports that his episodes almost every day or every several days.  Patient reports that his A. fib was not relieved with 2 doses of 30 mg as needed diltiazem yesterday evening.  Patient was also recently started on 2-1/2 mg of amlodipine at most recent cardiac appointment.  The history is provided by the patient.  Near Syncope This is a recurrent problem. The problem occurs every several days. The problem has not changed since onset.Associated symptoms include chest pain and shortness of breath. Pertinent negatives include no headaches. The symptoms are aggravated by exertion and standing. The symptoms are relieved by rest. The treatment provided no (30 mg PRN diltiazem.) relief.       Past Medical History:  Diagnosis Date  . Arthritis   . Bilateral carpal tunnel syndrome 08/16/2019  . CAD (coronary artery disease)    a. LHC on 01/29/16 which revealed significant apical LAD stenosis, best treated medically. There was moderate disease in the D2 and mid nondominant RCA.[name] No PCI performed b. 03/2018: repeat cath showing regression of his LAD stenosis now being at 50% with 60% mid RCA stenosis, 45% ostial LPDA stenosis, and 45% ostial second diagonal stenosis.  . Cataract   . Diabetes mellitus    type 2  . GERD (gastroesophageal reflux disease)   . Heart disease   . Hyperlipidemia    . Hypertension   . Obesity   . PAF (paroxysmal atrial fibrillation) ([place]) 01/2016  . Sleep apnea    has CPAP machine but not wear  . Tubular adenoma of colon 08/2012  . Ulnar neuropathy at elbow 08/16/2019   Bilateral    Patient Active Problem List   Diagnosis Date Noted  . Atrial fibrillation with RVR ([place]) 05/09/2020  . Bilateral carpal tunnel syndrome 08/16/2019  . Ulnar neuropathy at elbow 08/16/2019  . Typical atrial flutter ([place])   . Obesity 05/30/2016  . Leukocytosis 05/30/2016  . AKI (acute kidney injury) ([place]) 05/30/2016  . Diabetes mellitus, type II ([place]) 01/30/2016  . Coronary artery disease involving native coronary artery of native heart with angina pectoris ([place]); Apical LAD 80%, LPDA ~45%. D2 ~60% & non-dominant RCA 80%.   . Hypertension   . Hyperlipidemia   . Sleep apnea   . Atrial fibrillation with rapid ventricular response ([place]) 01/29/2016  . Elevated troponin     Past Surgical History:  Procedure Laterality Date  . ANKLE SURGERY Left 2012   tendon repair  . CARDIAC CATHETERIZATION [name] 01/29/2016   Procedure: Left Heart Cath and Coronary Angiography;  Surgeon: [name], MD;  Location: [place] [facility];  Service: Cardiovascular;  Laterality: [name];  . CARPAL TUNNEL RELEASE Right 09/06/2019   Procedure: CARPAL TUNNEL RELEASE;  Surgeon: Daryll Brod, MD;  Location: [place];  Service: Orthopedics;  Laterality: Right;  . CARPAL TUNNEL RELEASE Left 10/11/2019  Procedure: LEFT CARPAL TUNNEL RELEASE;  Surgeon: Daryll Brod, MD;  Location: [place];  Service: Orthopedics;  Laterality: Left;  IV REGIONAL FOREARM BLOCK  . CATARACT EXTRACTION W/PHACO  11/11/2012   Procedure: CATARACT EXTRACTION PHACO AND INTRAOCULAR LENS PLACEMENT (IOC);  Surgeon: [name], MD;  Location: [facility];  Service: Ophthalmology;  Laterality: Right;  CDE:  5.56  . CATARACT EXTRACTION W/PHACO Left 12/15/2013   Procedure: CATARACT EXTRACTION PHACO AND  INTRAOCULAR LENS PLACEMENT (IOC);  Surgeon: [name], MD;  Location: [facility];  Service: Ophthalmology;  Laterality: Left;  CDE:13.36  . CHOLECYSTECTOMY    . CHONDROPLASTY Right 05/28/2017   Procedure: RIGHT KNEE CHONDROPLASTY;  Surgeon: [name], MD;  Location: [place];  Service: Orthopedics;  Laterality: Right;  . COLONOSCOPY    . CYSTOSCOPY W/ URETERAL STENT PLACEMENT     right  . ELECTROPHYSIOLOGIC STUDY [name] 06/02/2016   Procedure: A-Flutter Ablation;  Surgeon: [name], MD;  Location: [place] [facility];  Service: Cardiovascular;  Laterality: [name];  . KNEE ARTHROSCOPY WITH MEDIAL MENISECTOMY Right 05/28/2017   Procedure: KNEE ARTHROSCOPY WITH MEDIAL MENISECTOMY WITH EXTENSIVE SYNOVECTOMY;  Surgeon: [name], MD;  Location: [place];  Service: Orthopedics;  Laterality: Right;  . LEFT HEART CATH AND CORONARY ANGIOGRAPHY [name] 03/30/2018   Procedure: LEFT HEART CATH AND CORONARY ANGIOGRAPHY;  Surgeon: Leonie Man, MD;  Location: [place] [facility];  Service: Cardiovascular;  Laterality: [name];  . left knee sugery Left    Arthroscopy  . NASAL SINUS SURGERY    . POLYPECTOMY    . TONSILLECTOMY         Family History  Problem Relation Age of Onset  . Heart murmur Mother   . Aneurysm Mother   . Hypertension Mother   . Cirrhosis Father   . Heart attack Sister   . Diabetes Sister   . Aneurysm Maternal Grandmother   . Stroke Maternal Grandmother   . Heart attack Paternal Grandmother   . Stroke Sister   . Colon cancer Neg Hx   . Rectal cancer Neg Hx   . Stomach cancer Neg Hx     Social History   Tobacco Use  . Smoking status: Never Smoker  . Smokeless tobacco: Never Used  Vaping Use  . Vaping Use: Never used  Substance Use Topics  . Alcohol use: Yes    Alcohol/week: 0.0 standard drinks    Comment: rare occasion  . Drug use: No    Home Medications Prior to Admission medications   Medication Sig Start Date End  Date Taking? Authorizing Provider  acetaminophen (TYLENOL) 325 MG tablet Take 650 mg by mouth every 6 (six) hours as needed for moderate pain or headache.    Yes [provider]  atorvastatin (LIPITOR) 80 MG tablet TAKE 1 TABLET (80 MG TOTAL) BY MOUTH DAILY AT 6 PM. 12/22/19  Yes Branch, Alphonse Guild, MD  glimepiride (AMARYL) 4 MG tablet Take 4 mg by mouth 2 (two) times daily.  07/25/12  Yes [provider]  HYDROcodone-acetaminophen (NORCO) 5-325 MG tablet Take 1 tablet by mouth every 6 (six) hours as needed. 10/11/19  Yes Daryll Brod, MD  isosorbide mononitrate (IMDUR) 120 MG 24 hr tablet Take 1 tablet (120 mg total) by mouth daily. 12/02/19  Yes BranchAlphonse Guild, MD  metoprolol succinate (TOPROL-XL) 100 MG 24 hr tablet Take 100 mg by mouth in the morning and at bedtime.    Yes [provider]  nitroGLYCERIN (NITROSTAT) 0.4 MG SL tablet Place 1 tablet (0.4 mg total) under the tongue every 5 (five) minutes as needed for chest pain. 03/16/18  Yes Branch, Alphonse Guild, MD  OZEMPIC, 0.25 OR 0.5 MG/DOSE, 2 MG/1.5ML SOPN Inject 0.5 mg into the skin every 7 (seven) days. 03/28/20  Yes [provider]  pantoprazole (PROTONIX) 40 MG tablet Take 40 mg by mouth in the morning.    Yes [provider]  SYNJARDY XR 12.03-999 MG TB24 Take 1 tablet by mouth in the morning and at bedtime. 01/03/20  Yes [provider]  XARELTO 20 MG TABS tablet TAKE 1 TABLET (20 MG TOTAL) BY MOUTH DAILY WITH SUPPER. 12/15/19  Yes Branch, Alphonse Guild, MD  amLODipine (NORVASC) 2.5 MG tablet Take 1 tablet (2.5 mg total) by mouth daily. 06/12/20 07/04/20 Yes Verta Ellen., NP  diltiazem (CARDIZEM CD) 240 MG 24 hr capsule Take 1 capsule (240 mg total) by mouth daily. 07/04/20 10/02/20  Wilber Oliphant, MD  empagliflozin (JARDIANCE) 25 MG TABS tablet Take 1 tablet (25 mg total) by mouth daily before breakfast. Take 1 tablet daily on the day of and the 2 days after your coronary CT Patient not  taking: Reported on 07/04/2020 06/07/20   Constance Haw, MD  furosemide (LASIX) 20 MG tablet Take 20 mg by mouth daily as needed for fluid.  Patient not taking: Reported on 07/04/2020    [provider]    Allergies    Bee venom and Codeine  Review of Systems   Review of Systems  Respiratory: Positive for shortness of breath.   Cardiovascular: Positive for chest pain and near-syncope.  Neurological: Negative for headaches.    Physical Exam Updated Vital Signs BP 103/66   Pulse 64   Temp 97.9 F (36.6 C) (Oral)   Resp 13   Ht 5\' 10"  (1.778 m)   Wt 117.9 kg   SpO2 96%   BMI 37.31 kg/m   Physical Exam Vitals and nursing note reviewed.  Constitutional:      Appearance: He is well-developed.  HENT:     Head: Normocephalic and atraumatic.  Eyes:     General:        Right eye: No discharge.        Left eye: No discharge.     Conjunctiva/sclera: Conjunctivae normal.  Neck:     Trachea: No tracheal deviation.  Cardiovascular:     Rate and Rhythm: Tachycardia present. Rhythm irregular.  Pulmonary:     Effort: Pulmonary effort is normal.     Breath sounds: Normal breath sounds.  Abdominal:     General: There is no distension.     Palpations: Abdomen is soft.     Tenderness: There is no abdominal tenderness. There is no guarding.  Musculoskeletal:     Cervical back: Normal range of motion and neck supple.  Skin:    General: Skin is warm.     Findings: No rash.  Neurological:     Mental Status: He is alert and oriented to person, place, and time.     ED Results / Procedures / Treatments   Labs (all labs ordered are listed, but only abnormal results are displayed) Labs Reviewed  BASIC METABOLIC PANEL - Abnormal; Notable for the following components:      Result Value   Glucose, Bld 232 (*)    All other components within normal limits  CBC - Abnormal; Notable for the following components:  WBC 10.7 (*)    All other components within normal limits    URINALYSIS, ROUTINE W REFLEX MICROSCOPIC - Abnormal; Notable for the following components:   Specific Gravity, Urine 1.032 (*)    Glucose, UA >=500 (*)    Ketones, ur 5 (*)    All other components within normal limits  CBG MONITORING, ED - Abnormal; Notable for the following components:   Glucose-Capillary 235 (*)    All other components within normal limits  MAGNESIUM  TSH  TROPONIN I (HIGH SENSITIVITY)  TROPONIN I (HIGH SENSITIVITY)    EKG EKG Interpretation  Date/Time:  Wednesday July 04 2020 09:35:22 EDT Ventricular Rate:  106 PR Interval:    QRS Duration: 84 QT Interval:  354 QTC Calculation: 471 R Axis:   66 Text Interpretation: Atrial fibrillation Low voltage, precordial leads Probable anteroseptal infarct, old Confirmed by Elnora Morrison [id number]) on 07/04/2020 9:42:13 AM   Radiology DG Chest Portable 1 View  Result Date: 07/04/2020 CLINICAL DATA:  Chest pain EXAM: PORTABLE CHEST 1 VIEW COMPARISON:  May 09, 2020 FINDINGS: There is no appreciable edema or airspace opacity. Heart size and pulmonary vascularity are normal. No adenopathy. There is no pneumothorax. There is degenerative change in the thoracic spine. IMPRESSION: No edema or airspace opacity. Cardiac silhouette within normal limits. Electronically Signed   By: Lowella Grip III M.D.   On: 07/04/2020 11:43    Procedures Procedures (including critical care time)  Medications Ordered in ED Medications - No data to display  ED Course  I have reviewed the triage vital signs and the nursing notes.  Pertinent labs & imaging results that were available during my care of the patient were reviewed by me and considered in my medical decision making (see chart for details).    MDM Rules/Calculators/A&P                          This is a 64 year old male with past medical history significant for A. fib, on apixaban, CAD, type 2 diabetes, hypertension, OSA, who presented with presyncope.  Presyncope most  likely due to patient going in and out of A. Fib.  Unlikely to be due to recently started amlodipine given small dose and symptoms inconsistent with orthostasis.  Obtain basic work-up with magnesium, chest x-ray, troponin.  Spoke to Dr. Harl Bowie, who will look at the patient's chart and drop a note with recommendations for medications.  Likely, patient will be able to discharge home and follow-up at his CT appointment scheduled for Friday, August 27.  Patient otherwise continues to be hemodynamically stable with no recurrent episodes of chest pain.   Pt on Xarelto for high CHADSVASC score.  Dr. Harl Bowie recommendations to:  - increase long acting diltiazem to 240 mg daily  - stop norvasc - Continue Toprol  - Continue PRN diltizaem - Follow up with EP for plan of ablation  - Okay for discharge from the ER    Final Clinical Impression(s) / ED Diagnoses Final diagnoses:  Near syncope  Longstanding persistent atrial fibrillation [facility])    Rx / DC Orders ED Discharge Orders         Ordered    diltiazem (CARDIZEM CD) 240 MG 24 hr capsule  Daily       Note to Pharmacy: Dose change 06/19/2020   07/04/20 1200           Wilber Oliphant, MD 07/04/20 1201    Elnora Morrison, MD 07/05/20 1559  Elnora Morrison, MD 08/13/20 [phone]

## 2020-07-04 NOTE — ED Notes (Signed)
Advised patient again we needed urine specimen.  Urinal at bedside.

## 2020-07-04 NOTE — Discharge Instructions (Addendum)
Dr. Harl Bowie made a few changes in her medications. Stop taking the amlodipine 2.5 mg (Norvasc) Increase your dose of diltiazem to 240 mg capsules.  This has been sent to the pharmacy. Continue your metoprolol Continue the as needed diltiazem  Please continue to follow-up with cardiology for plans for ablation.  You should go to your CT appointment on Friday.  If you have any repeat symptoms, lose consciousness, become very short of breath, please come back to the emergency department or call the cardiologist.

## 2020-07-05 ENCOUNTER — Telehealth: Payer: Self-pay | Admitting: Cardiology

## 2020-07-05 NOTE — Telephone Encounter (Signed)
Called to notify pt no answer. Voicemail full.

## 2020-07-05 NOTE — Telephone Encounter (Signed)
Please give pt a call concerning diltiazem (CARDIZEM CD) 240 MG 24 hr capsule and XARELTO 20 MG TABS tablet [[id number]]    Was told by the pharmacy that it will cause increased bleeding and the patient is concerned    Please call wife, Shirlean Mylar @ [phone]

## 2020-07-05 NOTE — Telephone Encounter (Signed)
Pt states he just rec'd a call from this office but doesn't know who called    Please call [phone]

## 2020-07-05 NOTE — Telephone Encounter (Signed)
He has very good kidney function so I would not have concerns about the reported interaction the pharmacist described. The concern would exist if he had kidney dysfunction   Zandra Abts MD

## 2020-07-05 NOTE — Telephone Encounter (Signed)
Returned call to pt regarding drug interaction.

## 2020-07-05 NOTE — Telephone Encounter (Signed)
Spoke with pt and informed him of pharmacist interaction and of Dr. Nelly Laurence note. Pt voiced understanding

## 2020-07-05 NOTE — Telephone Encounter (Signed)
Spoke with Sherren Mocha at [facility] in [place]. He stated that the drug interaction with Diltiazem and Xarelto is risk for increased bleeding in pt with poor renal function.

## 2020-07-06 ENCOUNTER — Other Ambulatory Visit: Payer: Self-pay

## 2020-07-06 ENCOUNTER — Ambulatory Visit (HOSPITAL_COMMUNITY)
Admission: RE | Admit: 2020-07-06 | Discharge: 2020-07-06 | Disposition: A | Discharge status: Home / Self Care | Payer: 59 | Source: Ambulatory Visit | Attending: Cardiology | Admitting: Cardiology

## 2020-07-06 DIAGNOSIS — I2510 Atherosclerotic heart disease of native coronary artery without angina pectoris: Secondary | ICD-10-CM

## 2020-07-06 DIAGNOSIS — R079 Chest pain, unspecified: Secondary | ICD-10-CM | POA: Insufficient documentation

## 2020-07-06 MED ORDER — NITROGLYCERIN 0.4 MG SL SUBL
0.8000 mg | SUBLINGUAL_TABLET | Freq: Once | SUBLINGUAL | Status: AC
  Administered 2020-07-06: 0.8 mg via SUBLINGUAL

## 2020-07-06 MED ORDER — IOHEXOL 350 MG/ML SOLN
80.0000 mL | Freq: Once | INTRAVENOUS | Status: AC | PRN
  Administered 2020-07-06: 80 mL via INTRAVENOUS

## 2020-07-06 MED ORDER — NITROGLYCERIN 0.4 MG SL SUBL
SUBLINGUAL_TABLET | SUBLINGUAL | Status: AC
  Filled 2020-07-06: qty 2

## 2020-07-07 ENCOUNTER — Ambulatory Visit (HOSPITAL_COMMUNITY)
Admission: RE | Admit: 2020-07-07 | Discharge: 2020-07-07 | Disposition: A | Discharge status: Home / Self Care | Payer: 59 | Source: Ambulatory Visit | Attending: Cardiology | Admitting: Cardiology

## 2020-07-07 DIAGNOSIS — R079 Chest pain, unspecified: Secondary | ICD-10-CM

## 2020-07-07 DIAGNOSIS — I2510 Atherosclerotic heart disease of native coronary artery without angina pectoris: Secondary | ICD-10-CM

## 2020-07-09 ENCOUNTER — Telehealth: Payer: Self-pay | Admitting: Cardiology

## 2020-07-09 DIAGNOSIS — I2510 Atherosclerotic heart disease of native coronary artery without angina pectoris: Secondary | ICD-10-CM | POA: Diagnosis not present

## 2020-07-09 NOTE — Telephone Encounter (Signed)
Patient wanted to set up his Ablation. He had a CT scan done Friday 07/06/20. Please contact patient to set up ablation

## 2020-07-09 NOTE — Telephone Encounter (Signed)
Pt aware he will need a catheterization prior to an ablation. He is agreeable and understands I will call him to arrange.

## 2020-07-09 NOTE — Telephone Encounter (Signed)
Ezel received a 3 question form from [employer] to be completed by Dr. Curt Bears. Placing the form in his box 07/09/20  [facility]

## 2020-07-10 ENCOUNTER — Telehealth: Payer: Self-pay | Admitting: Cardiology

## 2020-07-10 NOTE — Telephone Encounter (Signed)
Pt notified and voiced understanding 

## 2020-07-10 NOTE — Telephone Encounter (Signed)
Forms from [employer] received on 06/21/20. Completed patient auth attached. Took form to Dr. Macky Lower box for completion. 07/10/20 vlm

## 2020-07-10 NOTE — Telephone Encounter (Signed)
I agree with heart cath based on the CT results, it appears Dr Curt Bears office is arranging and should be in touch with him. Hopefully afib will be manageable until we can get his ablation done.    Zandra Abts MD

## 2020-07-10 NOTE — Telephone Encounter (Signed)
New message    Patient wanted to let you know he is in AFIB again it started around 7pm yesterday , he wants to know what steps he should be taking   Dr Curt Bears is trying to set up heart cath

## 2020-07-10 NOTE — Telephone Encounter (Signed)
Spoke with pt who states that he went into Afib on yesterday just after taking his evening meds. Pt states that he has since reverted back to a NSR according to his apple watch. Pt just wanted Dr. Harl Bowie to know.

## 2020-07-19 ENCOUNTER — Telehealth: Payer: Self-pay | Admitting: Cardiology

## 2020-07-19 NOTE — Telephone Encounter (Signed)
Pt was not placed out of work by Dr. Curt Bears. Forms from [facility] forwarded to Dr. Nelly Laurence office for completion. 07/19/20 vlm

## 2020-07-19 NOTE — Telephone Encounter (Signed)
     Pt would like to speak with RN Sherri about his upcoming procedure

## 2020-07-20 NOTE — Telephone Encounter (Signed)
Late entry: Spoke to pt this morning. Cath instructions reviewed, Covid screening scheduled. Pt aware they will re-review instructions at [place] Monday. Patient verbalized understanding and agreeable to plan.

## 2020-07-20 NOTE — Telephone Encounter (Signed)
Patient is returning call.  °

## 2020-07-22 NOTE — Progress Notes (Signed)
PCP:  Glenda Chroman, MD Primary Cardiologist: Carlyle Dolly, MD Electrophysiologist: Dr. Daphene Calamity. is a 64 y.o. male seen today for Lucas Meredith Leeds, MD for routine electrophysiology follow up and pre-procedure discusison of upcoming [place].[name]  Since last being seen in our clinic the patient reports doing about the same. He continues to have mild chest pain with exertion that resolves at rest. He is scheduled for LHC this Friday. Has occasional HRs in the 100s with mild palpitations he follows with his apple watch. He has not needed his prn lasix in "some time".  he denies dyspnea, PND, orthopnea, nausea, vomiting, dizziness, syncope, edema, weight gain, or early satiety.  Past Medical History:  Diagnosis Date   Arthritis    Bilateral carpal tunnel syndrome 08/16/2019   CAD (coronary artery disease)    a. LHC on 01/29/16 which revealed significant apical LAD stenosis, best treated medically. There was moderate disease in the D2 and mid nondominant RCA.[name] No PCI performed b. 03/2018: repeat cath showing regression of his LAD stenosis now being at 50% with 60% mid RCA stenosis, 45% ostial LPDA stenosis, and 45% ostial second diagonal stenosis.   Cataract    Diabetes mellitus    type 2   GERD (gastroesophageal reflux disease)    Heart disease    Hyperlipidemia    Hypertension    Obesity    PAF (paroxysmal atrial fibrillation) ([place]) 01/2016   Sleep apnea    has CPAP machine but not wear   Tubular adenoma of colon 08/2012   Ulnar neuropathy at elbow 08/16/2019   Bilateral   Past Surgical History:  Procedure Laterality Date   ANKLE SURGERY Left 2012   tendon repair   CARDIAC CATHETERIZATION [name] 01/29/2016   Procedure: Left Heart Cath and Coronary Angiography;  Surgeon: [name], MD;  Location: [facility];  Service: Cardiovascular;  Laterality: [name];   CARPAL TUNNEL RELEASE Right 09/06/2019   Procedure: CARPAL TUNNEL RELEASE;  Surgeon: Daryll Brod, MD;  Location: [facility];  Service: Orthopedics;  Laterality: Right;   CARPAL TUNNEL RELEASE Left 10/11/2019   Procedure: LEFT CARPAL TUNNEL RELEASE;  Surgeon: Daryll Brod, MD;  Location: [facility];  Service: Orthopedics;  Laterality: Left;  IV REGIONAL FOREARM BLOCK   CATARACT EXTRACTION W/PHACO  11/11/2012   Procedure: CATARACT EXTRACTION PHACO AND INTRAOCULAR LENS PLACEMENT (IOC);  Surgeon: [name], MD;  [facility]: [facility];  Service: Ophthalmology;  Laterality: Right;  CDE:  5.56   CATARACT EXTRACTION W/PHACO Left 12/15/2013   Procedure: CATARACT EXTRACTION PHACO AND INTRAOCULAR LENS PLACEMENT (IOC);  Surgeon: [name], MD;  [facility]: [facility];  Service: Ophthalmology;  Laterality: Left;  CDE:13.36   CHOLECYSTECTOMY     CHONDROPLASTY Right 05/28/2017   Procedure: RIGHT KNEE CHONDROPLASTY;  Surgeon: [name], MD;  Location: [facility];  Service: Orthopedics;  Laterality: Right;   COLONOSCOPY     CYSTOSCOPY W/ URETERAL STENT PLACEMENT     right   ELECTROPHYSIOLOGIC STUDY [name] 06/02/2016   Procedure: A-Flutter Ablation;  Surgeon: Lucas Meredith Leeds, MD;  Location: [facility];  Service: Cardiovascular;  Laterality: [name];   KNEE ARTHROSCOPY WITH MEDIAL MENISECTOMY Right 05/28/2017   Procedure: KNEE ARTHROSCOPY WITH MEDIAL MENISECTOMY WITH EXTENSIVE SYNOVECTOMY;  Surgeon: [name], MD;  Location: [facility];  Service: Orthopedics;  Laterality: Right;   LEFT HEART CATH AND CORONARY ANGIOGRAPHY [name] 03/30/2018   Procedure: LEFT  HEART CATH AND CORONARY ANGIOGRAPHY;  Surgeon: Leonie Man, MD;  Location: [facility];  Service: Cardiovascular;  Laterality: [name];   left knee sugery Left    Arthroscopy   NASAL SINUS SURGERY     POLYPECTOMY     TONSILLECTOMY      Current Outpatient Medications  Medication Sig Dispense Refill   acetaminophen (TYLENOL) 325 MG tablet Take 650 mg by mouth every 6  (six) hours as needed for moderate pain or headache.      atorvastatin (LIPITOR) 80 MG tablet TAKE 1 TABLET (80 MG TOTAL) BY MOUTH DAILY AT 6 PM. 90 tablet 3   diltiazem (CARDIZEM CD) 240 MG 24 hr capsule Take 1 capsule (240 mg total) by mouth daily. 90 capsule 0   furosemide (LASIX) 20 MG tablet Take 20 mg by mouth daily as needed for fluid.      glimepiride (AMARYL) 4 MG tablet Take 4 mg by mouth 2 (two) times daily.      HYDROcodone-acetaminophen (NORCO) 5-325 MG tablet Take 1 tablet by mouth every 6 (six) hours as needed. 20 tablet 0   isosorbide mononitrate (IMDUR) 120 MG 24 hr tablet Take 1 tablet (120 mg total) by mouth daily. 90 tablet 3   metoprolol succinate (TOPROL-XL) 100 MG 24 hr tablet Take 100 mg by mouth in the morning and at bedtime.      nitroGLYCERIN (NITROSTAT) 0.4 MG SL tablet Place 1 tablet (0.4 mg total) under the tongue every 5 (five) minutes as needed for chest pain. 25 tablet 3   OZEMPIC, 0.25 OR 0.5 MG/DOSE, 2 MG/1.5ML SOPN Inject 0.5 mg into the skin every 7 (seven) days.     pantoprazole (PROTONIX) 40 MG tablet Take 40 mg by mouth in the morning.      SYNJARDY XR 12.03-999 MG TB24 Take 1 tablet by mouth in the morning and at bedtime.     XARELTO 20 MG TABS tablet TAKE 1 TABLET (20 MG TOTAL) BY MOUTH DAILY WITH SUPPER. 90 tablet 3   No current facility-administered medications for this visit.    Allergies  Allergen Reactions   Bee Venom Swelling   Codeine Nausea Only    Social History   Socioeconomic History   Marital status: Married    Spouse name: Not on file   Number of children: Not on file   Years of education: 12   Highest education level: Not on file  Occupational History   Not on file  Tobacco Use   Smoking status: Never Smoker   Smokeless tobacco: Never Used  Vaping Use   Vaping Use: Never used  Substance and Sexual Activity   Alcohol use: Yes    Alcohol/week: 0.0 standard drinks    Comment: rare occasion   Drug  use: No   Sexual activity: Yes    Birth control/protection: None  Other Topics Concern   Not on file  Social History Narrative   Right handed   Caffeine~ 1-2 cups per day   Lives at home with wife Lucas [name]    Social Determinants of Health   Financial Resource Strain:    Difficulty of Paying Living Expenses: Not on file  Food Insecurity:    Worried About Charity fundraiser in the Last Year: Not on file   [employer] of Food in the Last Year: Not on file  Transportation Needs:    Lack of Transportation (Medical): Not on file   Lack of Transportation (Non-Medical): Not on file  Physical Activity:    Days of Exercise per Week: Not on file   Minutes of Exercise per Session: Not on file  Stress:    Feeling of Stress : Not on file  Social Connections:    Frequency of Communication with Friends and Family: Not on file   Frequency of Social Gatherings with Friends and Family: Not on file   Attends Religious Services: Not on file   Active Member of Clubs or Organizations: Not on file   Attends Archivist Meetings: Not on file   Marital Status: Not on file  Intimate Partner Violence:    Fear of Current or Ex-Partner: Not on file   Emotionally Abused: Not on file   Physically Abused: Not on file   Sexually Abused: Not on file     Review of Systems: General: No chills, fever, night sweats or weight changes  Cardiovascular:  No chest pain, dyspnea on exertion, edema, orthopnea, palpitations, paroxysmal nocturnal dyspnea Dermatological: No rash, lesions or masses Respiratory: No cough, dyspnea Urologic: No hematuria, dysuria Abdominal: No nausea, vomiting, diarrhea, bright red blood per rectum, melena, or hematemesis Neurologic: No visual changes, weakness, changes in mental status All other systems reviewed and are otherwise negative except as noted above.  Physical Exam: Vitals:   07/23/20 1005  BP: 112/62  Pulse: 63  SpO2: 96%  Weight: 262 lb (118.8  kg)  Height: 5\' 10"  (1.778 m)    GEN- The patient is well appearing, alert and oriented x 3 today.   HEENT: normocephalic, atraumatic; sclera clear, conjunctiva pink; hearing intact; oropharynx clear; neck supple, no JVP Lymph- no cervical lymphadenopathy Lungs- Clear to ausculation bilaterally, normal work of breathing.  No wheezes, rales, rhonchi Heart- Regular rate and rhythm, no murmurs, rubs or gallops, PMI not laterally displaced GI- soft, non-tender, non-distended, bowel sounds present, no hepatosplenomegaly Extremities- no clubbing, cyanosis, or edema; DP/PT/radial pulses 2+ bilaterally MS- no significant deformity or atrophy Skin- warm and dry, no rash or lesion Psych- euthymic mood, full affect Neuro- strength and sensation are intact  EKG is not ordered.   Additional studies reviewed include: Previous EP office notes. Previous cath notes (above). Previous EKG  Assessment and Plan:  1.  Paroxysmal atrial fibrillation:  Has had an increase in his episodes over the last few months.  Currently on Xarelto and metoprolol with a CHA2DS2-VASc of 4. He has had further episodes of atrial fibrillation. Dr. Curt Bears planning ablation once CAD has been addressed (LHC this week.)    2.  Coronary artery disease:  Continues to have mild chest pain.  Coronary CT FFR significant for hemodynamically significant distal LAD stenosis and small non-dominant RCA. Plan for LHC this week. Instructions given today. Labs today.   3.  Obstructive sleep apnea:  Encouraged nightly CPAP use.   4. DM2 He Lucas hold his DM medications the am of procedure. Metformin agent Lucas be held for an additional 48 hours.   Shirley Friar, PA-C  07/23/20 10:19 AM

## 2020-07-22 NOTE — H&P (View-Only) (Signed)
PCP:  Glenda Chroman, MD Primary Cardiologist: Carlyle Dolly, MD Electrophysiologist: Dr. Daphene Calamity. is a 64 y.o. male seen today for Lucas Meredith Leeds, MD for routine electrophysiology follow up and pre-procedure discusison of upcoming [place].[name]  Since last being seen in our clinic the patient reports doing about the same. He continues to have mild chest pain with exertion that resolves at rest. He is scheduled for LHC this Friday. Has occasional HRs in the 100s with mild palpitations he follows with his apple watch. He has not needed his prn lasix in "some time".  he denies dyspnea, PND, orthopnea, nausea, vomiting, dizziness, syncope, edema, weight gain, or early satiety.  Past Medical History:  Diagnosis Date   Arthritis    Bilateral carpal tunnel syndrome 08/16/2019   CAD (coronary artery disease)    a. LHC on 01/29/16 which revealed significant apical LAD stenosis, best treated medically. There was moderate disease in the D2 and mid nondominant RCA.[name] No PCI performed b. 03/2018: repeat cath showing regression of his LAD stenosis now being at 50% with 60% mid RCA stenosis, 45% ostial LPDA stenosis, and 45% ostial second diagonal stenosis.   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   Bilateral   Past Surgical History:  Procedure Laterality Date   ANKLE SURGERY Left 2012   tendon repair   CARDIAC CATHETERIZATION [name] 01/29/2016   Procedure: Left Heart Cath and Coronary Angiography;  Surgeon: [name], MD;  Location: [facility];  Service: Cardiovascular;  Laterality: [name];   CARPAL TUNNEL RELEASE Right 09/06/2019   Procedure: CARPAL TUNNEL RELEASE;  Surgeon: Daryll Brod, MD;  Location: [facility];  Service: Orthopedics;  Laterality: Right;   CARPAL TUNNEL RELEASE Left 10/11/2019   Procedure: LEFT CARPAL TUNNEL RELEASE;  Surgeon: Daryll Brod, MD;  Location: [facility];  Service: Orthopedics;  Laterality: Left;  IV REGIONAL FOREARM BLOCK   CATARACT EXTRACTION W/PHACO  11/11/2012   Procedure: CATARACT EXTRACTION PHACO AND INTRAOCULAR LENS PLACEMENT (IOC);  Surgeon: [name], MD;  [facility];  Service: Ophthalmology;  Laterality: Right;  CDE:  5.56   CATARACT EXTRACTION W/PHACO Left 12/15/2013   Procedure: CATARACT EXTRACTION PHACO AND INTRAOCULAR LENS PLACEMENT (IOC);  Surgeon: [name], MD;  [facility];  Service: Ophthalmology;  Laterality: Left;  CDE:13.36   CHOLECYSTECTOMY     CHONDROPLASTY Right 05/28/2017   Procedure: RIGHT KNEE CHONDROPLASTY;  Surgeon: [name], MD;  Location: [facility];  Service: Orthopedics;  Laterality: Right;   COLONOSCOPY     CYSTOSCOPY W/ URETERAL STENT PLACEMENT     right   ELECTROPHYSIOLOGIC STUDY [name] 06/02/2016   Procedure: A-Flutter Ablation;  Surgeon: Lucas Meredith Leeds, MD;  Location: [facility];  Service: Cardiovascular;  Laterality: [name];   KNEE ARTHROSCOPY WITH MEDIAL MENISECTOMY Right 05/28/2017   Procedure: KNEE ARTHROSCOPY WITH MEDIAL MENISECTOMY WITH EXTENSIVE SYNOVECTOMY;  Surgeon: [name], MD;  Location: [facility];  Service: Orthopedics;  Laterality: Right;   LEFT HEART CATH AND CORONARY ANGIOGRAPHY [name] 03/30/2018   Procedure: LEFT  HEART CATH AND CORONARY ANGIOGRAPHY;  Surgeon: Leonie Man, MD;  Location: [facility];  Service: Cardiovascular;  Laterality: [name];   left knee sugery Left    Arthroscopy   NASAL SINUS SURGERY     POLYPECTOMY     TONSILLECTOMY      Current Outpatient Medications  Medication Sig Dispense Refill   acetaminophen (TYLENOL) 325 MG tablet Take 650 mg by mouth every 6  (six) hours as needed for moderate pain or headache.      atorvastatin (LIPITOR) 80 MG tablet TAKE 1 TABLET (80 MG TOTAL) BY MOUTH DAILY AT 6 PM. 90 tablet 3   diltiazem (CARDIZEM CD) 240 MG 24 hr capsule Take 1 capsule (240 mg total) by mouth daily. 90 capsule 0   furosemide (LASIX) 20 MG tablet Take 20 mg by mouth daily as needed for fluid.      glimepiride (AMARYL) 4 MG tablet Take 4 mg by mouth 2 (two) times daily.      HYDROcodone-acetaminophen (NORCO) 5-325 MG tablet Take 1 tablet by mouth every 6 (six) hours as needed. 20 tablet 0   isosorbide mononitrate (IMDUR) 120 MG 24 hr tablet Take 1 tablet (120 mg total) by mouth daily. 90 tablet 3   metoprolol succinate (TOPROL-XL) 100 MG 24 hr tablet Take 100 mg by mouth in the morning and at bedtime.      nitroGLYCERIN (NITROSTAT) 0.4 MG SL tablet Place 1 tablet (0.4 mg total) under the tongue every 5 (five) minutes as needed for chest pain. 25 tablet 3   OZEMPIC, 0.25 OR 0.5 MG/DOSE, 2 MG/1.5ML SOPN Inject 0.5 mg into the skin every 7 (seven) days.     pantoprazole (PROTONIX) 40 MG tablet Take 40 mg by mouth in the morning.      SYNJARDY XR 12.03-999 MG TB24 Take 1 tablet by mouth in the morning and at bedtime.     XARELTO 20 MG TABS tablet TAKE 1 TABLET (20 MG TOTAL) BY MOUTH DAILY WITH SUPPER. 90 tablet 3   No current facility-administered medications for this visit.    Allergies  Allergen Reactions   Bee Venom Swelling   Codeine Nausea Only    Social History   Socioeconomic History   Marital status: Married    Spouse name: Not on file   Number of children: Not on file   Years of education: 12   Highest education level: Not on file  Occupational History   Not on file  Tobacco Use   Smoking status: Never Smoker   Smokeless tobacco: Never Used  Vaping Use   Vaping Use: Never used  Substance and Sexual Activity   Alcohol use: Yes    Alcohol/week: 0.0 standard drinks    Comment: rare occasion   Drug  use: No   Sexual activity: Yes    Birth control/protection: None  Other Topics Concern   Not on file  Social History Narrative   Right handed   Caffeine~ 1-2 cups per day   Lives at home with wife Robin    Social Determinants of Health   Financial Resource Strain:    Difficulty of Paying Living Expenses: Not on file  Food Insecurity:    Worried About Charity fundraiser in the Last Year: Not on file   [employer] of Food in the Last Year: Not on file  Transportation Needs:    Lack of Transportation (Medical): Not on file   Lack of Transportation (Non-Medical): Not on file  Physical Activity:    Days of Exercise per Week: Not on file   Minutes of Exercise per Session: Not on file  Stress:    Feeling of Stress : Not on file  Social Connections:    Frequency of Communication with Friends and Family: Not on file   Frequency of Social Gatherings with Friends and Family: Not on file   Attends Religious Services: Not on file   Active Member of Clubs or Organizations: Not on file   Attends Archivist Meetings: Not on file   Marital Status: Not on file  Intimate Partner Violence:    Fear of Current or Ex-Partner: Not on file   Emotionally Abused: Not on file   Physically Abused: Not on file   Sexually Abused: Not on file     Review of Systems: General: No chills, fever, night sweats or weight changes  Cardiovascular:  No chest pain, dyspnea on exertion, edema, orthopnea, palpitations, paroxysmal nocturnal dyspnea Dermatological: No rash, lesions or masses Respiratory: No cough, dyspnea Urologic: No hematuria, dysuria Abdominal: No nausea, vomiting, diarrhea, bright red blood per rectum, melena, or hematemesis Neurologic: No visual changes, weakness, changes in mental status All other systems reviewed and are otherwise negative except as noted above.  Physical Exam: Vitals:   07/23/20 1005  BP: 112/62  Pulse: 63  SpO2: 96%  Weight: 262 lb (118.8  kg)  Height: 5\' 10"  (1.778 m)    GEN- The patient is well appearing, alert and oriented x 3 today.   HEENT: normocephalic, atraumatic; sclera clear, conjunctiva pink; hearing intact; oropharynx clear; neck supple, no JVP Lymph- no cervical lymphadenopathy Lungs- Clear to ausculation bilaterally, normal work of breathing.  No wheezes, rales, rhonchi Heart- Regular rate and rhythm, no murmurs, rubs or gallops, PMI not laterally displaced GI- soft, non-tender, non-distended, bowel sounds present, no hepatosplenomegaly Extremities- no clubbing, cyanosis, or edema; DP/PT/radial pulses 2+ bilaterally MS- no significant deformity or atrophy Skin- warm and dry, no rash or lesion Psych- euthymic mood, full affect Neuro- strength and sensation are intact  EKG is not ordered.   Additional studies reviewed include: Previous EP office notes. Previous cath notes (above). Previous EKG  Assessment and Plan:  1.  Paroxysmal atrial fibrillation:  Has had an increase in his episodes over the last few months.  Currently on Xarelto and metoprolol with a CHA2DS2-VASc of 4. He has had further episodes of atrial fibrillation. Dr. Curt Bears planning ablation once CAD has been addressed (LHC this week.)    2.  Coronary artery disease:  Continues to have mild chest pain.  Coronary CT FFR significant for hemodynamically significant distal LAD stenosis and small non-dominant RCA. Plan for LHC this week. Instructions given today. Labs today.   3.  Obstructive sleep apnea:  Encouraged nightly CPAP use.   4. DM2 He Lucas hold his DM medications the am of procedure. Metformin agent Lucas be held for an additional 48 hours.   Shirley Friar, PA-C  07/23/20 10:19 AM

## 2020-07-23 ENCOUNTER — Ambulatory Visit: Payer: 59 | Admitting: Student

## 2020-07-23 ENCOUNTER — Other Ambulatory Visit: Payer: Self-pay

## 2020-07-23 ENCOUNTER — Encounter: Payer: Self-pay | Admitting: Student

## 2020-07-23 VITALS — BP 112/62 | HR 63 | Ht 70.0 in | Wt 262.0 lb

## 2020-07-23 DIAGNOSIS — I480 Paroxysmal atrial fibrillation: Secondary | ICD-10-CM | POA: Diagnosis not present

## 2020-07-23 DIAGNOSIS — I10 Essential (primary) hypertension: Secondary | ICD-10-CM | POA: Diagnosis not present

## 2020-07-23 DIAGNOSIS — I2510 Atherosclerotic heart disease of native coronary artery without angina pectoris: Secondary | ICD-10-CM | POA: Diagnosis not present

## 2020-07-23 DIAGNOSIS — E118 Type 2 diabetes mellitus with unspecified complications: Secondary | ICD-10-CM

## 2020-07-23 DIAGNOSIS — G4733 Obstructive sleep apnea (adult) (pediatric): Secondary | ICD-10-CM

## 2020-07-23 LAB — CBC WITH DIFFERENTIAL/PLATELET
Basophils Absolute: 0.1 10*3/uL (ref 0.0–0.2)
Basos: 1 %
EOS (ABSOLUTE): 0.2 10*3/uL (ref 0.0–0.4)
Eos: 2 %
Hematocrit: 44.5 % (ref 37.5–51.0)
Hemoglobin: 15.1 g/dL (ref 13.0–17.7)
Lymphocytes Absolute: 4.3 10*3/uL — ABNORMAL HIGH (ref 0.7–3.1)
Lymphs: 36 %
MCH: 31 pg (ref 26.6–33.0)
MCHC: 33.9 g/dL (ref 31.5–35.7)
MCV: 91 fL (ref 79–97)
Monocytes Absolute: 1.3 10*3/uL — ABNORMAL HIGH (ref 0.1–0.9)
Monocytes: 11 %
Neutrophils Absolute: 6.2 10*3/uL (ref 1.4–7.0)
Neutrophils: 51 %
Platelets: 182 10*3/uL (ref 150–450)
RBC: 4.87 x10E6/uL (ref 4.14–5.80)
RDW: 15.3 % (ref 11.6–15.4)
WBC: 12.2 10*3/uL — ABNORMAL HIGH (ref 3.4–10.8)

## 2020-07-23 LAB — BASIC METABOLIC PANEL
BUN/Creatinine Ratio: 19 (ref 10–24)
BUN: 15 mg/dL (ref 8–27)
CO2: 24 mmol/L (ref 20–29)
Calcium: 9.5 mg/dL (ref 8.6–10.2)
Chloride: 102 mmol/L (ref 96–106)
Creatinine, Ser: 0.8 mg/dL (ref 0.76–1.27)
GFR calc Af Amer: 108 mL/min/{1.73_m2} (ref >59)
GFR calc non Af Amer: 93 mL/min/{1.73_m2} (ref >59)
Glucose: 153 mg/dL — ABNORMAL HIGH (ref 65–99)
Potassium: 4.4 mmol/L (ref 3.5–5.2)
Sodium: 137 mmol/L (ref 134–144)

## 2020-07-23 NOTE — Patient Instructions (Signed)
Medication Instructions:  *If you need a refill on your cardiac medications before your next appointment, please call your pharmacy*  Lab Work: Your physician has recommended that you have lab work today: BMET and CBC  If you have labs (blood work) drawn today and your tests are completely normal, you will receive your results only by: [name] MyChart Message (if you have MyChart) OR . A paper copy in the mail If you have any lab test that is abnormal or we need to change your treatment, we will call you to review the results.  Testing/Procedures: Your physician has requested that you have a cardiac catheterization. Cardiac catheterization is used to diagnose and/or treat various heart conditions. Doctors may recommend this procedure for a number of different reasons. The most common reason is to evaluate chest pain. Chest pain can be a symptom of coronary artery disease (CAD), and cardiac catheterization can show whether plaque is narrowing or blocking your heart's arteries. This procedure is also used to evaluate the valves, as well as measure the blood flow and oxygen levels in different parts of your heart. For further information please visit [email or web address].[email or web address]. Please follow instruction sheet, as given.  Follow-Up: At [facility], you and your health needs are our priority.  As part of our continuing mission to provide you with exceptional heart care, we have created designated Provider Care Teams.  These Care Teams include your primary Cardiologist (physician) and Advanced Practice Providers (APPs -  Physician Assistants and Nurse Practitioners) who all work together to provide you with the care you need, when you need it.  We recommend signing up for the patient portal called "MyChart".  Sign up information is provided on this After Visit Summary.  MyChart is used to connect with patients for Virtual Visits (Telemedicine).  Patients are able to view lab/test results, encounter notes, upcoming  appointments, etc.  Non-urgent messages can be sent to your provider as well.   To learn more about what you can do with MyChart, go to [email or web address].    Your next appointment:   Your physician recommends that you schedule a follow-up appointment in: 4-6 WEEKS with Dr. Curt Bears  The format for your next appointment:   In Person with Allegra Lai, [address], [place] [address] Dept: [phone] Loc: [place].  07/23/2020  You are scheduled for a Cardiac Catheterization on Friday, September 17 with Dr. Sherren Mocha.  1. Please arrive at the [facility] (Main Entrance A) at [facility]: [address] [place], [place] [address] at 7:00 AM (This time is two hours before your procedure to ensure your preparation). Free valet parking service is available.   Special note: Every effort is made to have your procedure done on time. Please understand that emergencies sometimes delay scheduled procedures.  2. Diet: Do not eat solid foods after midnight.  The patient may have clear liquids until 5am upon the day of the procedure.  3. Labs: You will need to have blood drawn on Monday, September 13 at [facility] at [facility]. [address]  Open: 7:30am - 5pm    Phone: [phone]. You do not need to be fasting.  4. Medication instructions in preparation for your procedure:  Stop taking Xarelto (Rivaroxaban) on Tuesday, September 14.  -- DO NOT TAKE YOUR XARELTO ON  Wednesday, Thursday, or Friday Morning. Stop taking, Lasix (Furosemide)  Friday, September 17,  - DO NOT TAKE the morning of your procedure  Do not take Diabetes Med Empagliflozin-Metformin and Glimepiride on the day of the procedure and HOLD 48 HOURS AFTER THE PROCEDURE. -- Your may resume your Glimepiride the day after your  procedure  On the morning of your procedure, take your Aspirin and any morning medicines NOT listed above.  You may use sips of water.  5. Plan for one night stay--bring personal belongings. 6. Bring a current list of your medications and current insurance cards. 7. You MUST have a responsible person to drive you home. 8. Someone MUST be with you the first 24 hours after you arrive home or your discharge will be delayed. 9. Please wear clothes that are easy to get on and off and wear slip-on shoes.  Thank you for allowing [place] to care for you!   -- [facility]

## 2020-07-25 ENCOUNTER — Other Ambulatory Visit (HOSPITAL_COMMUNITY)
Admission: RE | Admit: 2020-07-25 | Discharge: 2020-07-25 | Disposition: A | Discharge status: Home / Self Care | Payer: 59 | Source: Ambulatory Visit | Attending: Cardiovascular Disease | Admitting: Cardiovascular Disease

## 2020-07-25 DIAGNOSIS — Z01812 Encounter for preprocedural laboratory examination: Secondary | ICD-10-CM | POA: Insufficient documentation

## 2020-07-25 DIAGNOSIS — Z20822 Contact with and (suspected) exposure to covid-19: Secondary | ICD-10-CM | POA: Diagnosis not present

## 2020-07-25 LAB — SARS CORONAVIRUS 2 (TAT 6-24 HRS): SARS Coronavirus 2: NEGATIVE

## 2020-07-26 ENCOUNTER — Telehealth: Payer: Self-pay | Admitting: *Deleted

## 2020-07-26 NOTE — Telephone Encounter (Signed)
Pt contacted pre-catheterization scheduled at [facility] for: Friday July 27, 2020 9 AM Verified arrival time and place: [facility]) at: 7 AM   No solid food after midnight prior to cath, clear liquids until 5 AM day of procedure.  Hold: Xarelto-none 07/25/20 until post procedure Lasix-AM of procedure No diabetes medications AM of procedure: -Synjardy-day of procedure and 48 hours post procedure -Glimepiride-AM of procedure   Except hold medications AM meds can be  taken pre-cath with sips of water including: ASA 81 mg   Confirmed patient has responsible adult to drive home post procedure and be with patient first 24 hours after arriving home: yes  You are allowed ONE visitor in the waiting room during the time you are at the hospital for your procedure. Both you and your visitor must wear a mask once you enter the hospital.       COVID-19 Pre-Screening Questions:   In the past 10 days have you had a new cough, shortness of breath, headache, congestion, fever (100 or greater) unexplained body aches, new sore throat, or sudden loss of taste or sense of smell? no  In the past 10 days have you been around anyone with known Covid 19? no  Have you been vaccinated for COVID-19? Yes, see immunization history      Reviewed procedure/mask/visitor instructions, COVID-19 questions with patient.

## 2020-07-27 ENCOUNTER — Encounter (HOSPITAL_COMMUNITY)
Admission: RE | Disposition: A | Discharge status: Home / Self Care | Payer: Self-pay | Source: Home / Self Care | Attending: Cardiovascular Disease

## 2020-07-27 ENCOUNTER — Ambulatory Visit (HOSPITAL_COMMUNITY)
Admission: RE | Admit: 2020-07-27 | Discharge: 2020-07-27 | Disposition: A | Discharge status: Home / Self Care | Payer: 59 | Attending: Cardiovascular Disease | Admitting: Cardiovascular Disease

## 2020-07-27 ENCOUNTER — Other Ambulatory Visit: Payer: Self-pay

## 2020-07-27 DIAGNOSIS — I480 Paroxysmal atrial fibrillation: Secondary | ICD-10-CM | POA: Insufficient documentation

## 2020-07-27 DIAGNOSIS — K219 Gastro-esophageal reflux disease without esophagitis: Secondary | ICD-10-CM | POA: Insufficient documentation

## 2020-07-27 DIAGNOSIS — Z79899 Other long term (current) drug therapy: Secondary | ICD-10-CM | POA: Insufficient documentation

## 2020-07-27 DIAGNOSIS — E785 Hyperlipidemia, unspecified: Secondary | ICD-10-CM | POA: Diagnosis not present

## 2020-07-27 DIAGNOSIS — G4733 Obstructive sleep apnea (adult) (pediatric): Secondary | ICD-10-CM | POA: Diagnosis not present

## 2020-07-27 DIAGNOSIS — Z7901 Long term (current) use of anticoagulants: Secondary | ICD-10-CM | POA: Diagnosis not present

## 2020-07-27 DIAGNOSIS — E1151 Type 2 diabetes mellitus with diabetic peripheral angiopathy without gangrene: Secondary | ICD-10-CM | POA: Insufficient documentation

## 2020-07-27 DIAGNOSIS — Z7984 Long term (current) use of oral hypoglycemic drugs: Secondary | ICD-10-CM | POA: Insufficient documentation

## 2020-07-27 DIAGNOSIS — I209 Angina pectoris, unspecified: Secondary | ICD-10-CM | POA: Diagnosis present

## 2020-07-27 DIAGNOSIS — I2510 Atherosclerotic heart disease of native coronary artery without angina pectoris: Secondary | ICD-10-CM | POA: Insufficient documentation

## 2020-07-27 DIAGNOSIS — E669 Obesity, unspecified: Secondary | ICD-10-CM | POA: Diagnosis not present

## 2020-07-27 DIAGNOSIS — Z6838 Body mass index (BMI) 38.0-38.9, adult: Secondary | ICD-10-CM | POA: Insufficient documentation

## 2020-07-27 DIAGNOSIS — Z885 Allergy status to narcotic agent status: Secondary | ICD-10-CM | POA: Insufficient documentation

## 2020-07-27 DIAGNOSIS — I10 Essential (primary) hypertension: Secondary | ICD-10-CM | POA: Insufficient documentation

## 2020-07-27 HISTORY — PX: LEFT HEART CATH AND CORONARY ANGIOGRAPHY: CATH118249

## 2020-07-27 LAB — GLUCOSE, CAPILLARY: Glucose-Capillary: 100 mg/dL — ABNORMAL HIGH (ref 70–99)

## 2020-07-27 SURGERY — LEFT HEART CATH AND CORONARY ANGIOGRAPHY
Anesthesia: LOCAL

## 2020-07-27 MED ORDER — SODIUM CHLORIDE 0.9% FLUSH: 3.0000 mL | INTRAVENOUS | Status: DC | PRN

## 2020-07-27 MED ORDER — SODIUM CHLORIDE 0.9% FLUSH: 3.0000 mL | Freq: Two times a day (BID) | INTRAVENOUS | Status: DC

## 2020-07-27 MED ORDER — HEPARIN SODIUM (PORCINE) 1000 UNIT/ML IJ SOLN
INTRAMUSCULAR | Status: DC | PRN
  Administered 2020-07-27: 5000 [IU] via INTRAVENOUS

## 2020-07-27 MED ORDER — MIDAZOLAM HCL 2 MG/2ML IJ SOLN
INTRAMUSCULAR | Status: AC
  Filled 2020-07-27: qty 2

## 2020-07-27 MED ORDER — ONDANSETRON HCL 4 MG/2ML IJ SOLN: 4.0000 mg | Freq: Four times a day (QID) | INTRAMUSCULAR | Status: DC | PRN

## 2020-07-27 MED ORDER — LABETALOL HCL 5 MG/ML IV SOLN: 10.0000 mg | INTRAVENOUS | Status: DC | PRN

## 2020-07-27 MED ORDER — SODIUM CHLORIDE 0.9 % WEIGHT BASED INFUSION
3.0000 mL/kg/h | INTRAVENOUS | Status: AC
  Administered 2020-07-27: 3 mL/kg/h via INTRAVENOUS

## 2020-07-27 MED ORDER — SODIUM CHLORIDE 0.9 % IV SOLN: 250.0000 mL | INTRAVENOUS | Status: DC | PRN

## 2020-07-27 MED ORDER — VERAPAMIL HCL 2.5 MG/ML IV SOLN
INTRAVENOUS | Status: DC | PRN
  Administered 2020-07-27: 10 mL via INTRA_ARTERIAL

## 2020-07-27 MED ORDER — HEPARIN (PORCINE) IN NACL 1000-0.9 UT/500ML-% IV SOLN
INTRAVENOUS | Status: AC
  Filled 2020-07-27: qty 1000

## 2020-07-27 MED ORDER — FENTANYL CITRATE (PF) 100 MCG/2ML IJ SOLN
INTRAMUSCULAR | Status: AC
  Filled 2020-07-27: qty 2

## 2020-07-27 MED ORDER — MIDAZOLAM HCL 2 MG/2ML IJ SOLN
INTRAMUSCULAR | Status: DC | PRN
  Administered 2020-07-27: 2 mg via INTRAVENOUS

## 2020-07-27 MED ORDER — HYDRALAZINE HCL 20 MG/ML IJ SOLN: 10.0000 mg | INTRAMUSCULAR | Status: DC | PRN

## 2020-07-27 MED ORDER — ASPIRIN 81 MG PO CHEW: 81.0000 mg | CHEWABLE_TABLET | ORAL | Status: DC

## 2020-07-27 MED ORDER — HEPARIN (PORCINE) IN NACL 1000-0.9 UT/500ML-% IV SOLN
INTRAVENOUS | Status: DC | PRN
  Administered 2020-07-27 (×2): 500 mL

## 2020-07-27 MED ORDER — LIDOCAINE HCL (PF) 1 % IJ SOLN
INTRAMUSCULAR | Status: AC
  Filled 2020-07-27: qty 30

## 2020-07-27 MED ORDER — SODIUM CHLORIDE 0.9 % WEIGHT BASED INFUSION: 1.0000 mL/kg/h | INTRAVENOUS | Status: DC

## 2020-07-27 MED ORDER — IOHEXOL 350 MG/ML SOLN
INTRAVENOUS | Status: DC | PRN
  Administered 2020-07-27: 65 mL

## 2020-07-27 MED ORDER — FENTANYL CITRATE (PF) 100 MCG/2ML IJ SOLN
INTRAMUSCULAR | Status: DC | PRN
  Administered 2020-07-27: 25 ug via INTRAVENOUS

## 2020-07-27 MED ORDER — VERAPAMIL HCL 2.5 MG/ML IV SOLN
INTRAVENOUS | Status: AC
  Filled 2020-07-27: qty 2

## 2020-07-27 MED ORDER — HEPARIN SODIUM (PORCINE) 1000 UNIT/ML IJ SOLN
INTRAMUSCULAR | Status: AC
  Filled 2020-07-27: qty 1

## 2020-07-27 MED ORDER — LIDOCAINE HCL (PF) 1 % IJ SOLN
INTRAMUSCULAR | Status: DC | PRN
  Administered 2020-07-27: 5 mL

## 2020-07-27 MED ORDER — ACETAMINOPHEN 325 MG PO TABS: 650.0000 mg | ORAL_TABLET | ORAL | Status: DC | PRN

## 2020-07-27 SURGICAL SUPPLY — 9 items
CATH 5FR JL3.5 JR4 ANG PIG MP (CATHETERS) ×1 IMPLANT
DEVICE RAD COMP TR BAND LRG (VASCULAR PRODUCTS) ×2 IMPLANT
GLIDESHEATH SLEND SS 6F .021 (SHEATH) ×1 IMPLANT
GUIDEWIRE INQWIRE 1.5J.035X260 (WIRE) IMPLANT
INQWIRE 1.5J .035X260CM (WIRE) ×2
KIT HEART LEFT (KITS) ×2 IMPLANT
PACK CARDIAC CATHETERIZATION (CUSTOM PROCEDURE TRAY) ×2 IMPLANT
TRANSDUCER W/STOPCOCK (MISCELLANEOUS) ×2 IMPLANT
TUBING CIL FLEX 10 FLL-RA (TUBING) ×2 IMPLANT

## 2020-07-27 NOTE — Discharge Instructions (Signed)
Radial Site Care  This sheet gives you information about how to care for yourself after your procedure. Your health care provider may also give you more specific instructions. If you have problems or questions, contact your health care provider. What can I expect after the procedure? After the procedure, it is common to have:  Bruising and tenderness at the catheter insertion area. Follow these instructions at home: Medicines  Take over-the-counter and prescription medicines only as told by your health care provider. Insertion site care  Follow instructions from your health care provider about how to take care of your insertion site. Make sure you: ? Wash your hands with soap and water before you change your bandage (dressing). If soap and water are not available, use hand sanitizer. ? Change your dressing as told by your health care provider. ? Leave stitches (sutures), skin glue, or adhesive strips in place. These skin closures may need to stay in place for 2 weeks or longer. If adhesive strip edges start to loosen and curl up, you may trim the loose edges. Do not remove adhesive strips completely unless your health care provider tells you to do that.  Check your insertion site every day for signs of infection. Check for: ? Redness, swelling, or pain. ? Fluid or blood. ? Pus or a bad smell. ? Warmth.  Do not take baths, swim, or use a hot tub until your health care provider approves.  You may shower 24-48 hours after the procedure, or as directed by your health care provider. ? Remove the dressing and gently wash the site with plain soap and water. ? Pat the area dry with a clean towel. ? Do not rub the site. That could cause bleeding.  Do not apply powder or lotion to the site. Activity   For 24 hours after the procedure, or as directed by your health care provider: ? Do not flex or bend the affected arm. ? Do not push or pull heavy objects with the affected arm. ? Do not  drive yourself home from the hospital or clinic. You may drive 24 hours after the procedure unless your health care provider tells you not to. ? Do not operate machinery or power tools.  Do not lift anything that is heavier than 10 lb (4.5 kg), or the limit that you are told, until your health care provider says that it is safe.  Ask your health care provider when it is okay to: ? Return to work or school. ? Resume usual physical activities or sports. ? Resume sexual activity. General instructions  If the catheter site starts to bleed, raise your arm and put firm pressure on the site. If the bleeding does not stop, get help right away. This is a medical emergency.  If you went home on the same day as your procedure, a responsible adult should be with you for the first 24 hours after you arrive home.  Keep all follow-up visits as told by your health care provider. This is important. Contact a health care provider if:  You have a fever.  You have redness, swelling, or yellow drainage around your insertion site. Get help right away if:  You have unusual pain at the radial site.  The catheter insertion area swells very fast.  The insertion area is bleeding, and the bleeding does not stop when you hold steady pressure on the area.  Your arm or hand becomes pale, cool, tingly, or numb. These symptoms may represent a serious problem   that is an emergency. Do not wait to see if the symptoms will go away. Get medical help right away. Call your local emergency services (911 in the [place] Do not drive yourself to the hospital. Summary  After the procedure, it is common to have bruising and tenderness at the site.  Follow instructions from your health care provider about how to take care of your radial site wound. Check the wound every day for signs of infection.  Do not lift anything that is heavier than 10 lb (4.5 kg), or the limit that you are told, until your health care provider says  that it is safe. This information is not intended to replace advice given to you by your health care provider. Make sure you discuss any questions you have with your health care provider. Document Revised: 12/02/2017 Document Reviewed: 12/02/2017 Elsevier Patient Education  2020 [employer]  

## 2020-07-27 NOTE — Interval H&P Note (Signed)
History and Physical Interval Note:  07/27/2020 9:11 AM  Lucas [name].  has presented today for surgery, with the diagnosis of Abnormal CT and chest pain.  The various methods of treatment have been discussed with the patient and family. After consideration of risks, benefits and other options for treatment, the patient has consented to  Procedure(s): LEFT HEART CATH AND CORONARY ANGIOGRAPHY ([name]) as a surgical intervention.  The patient's history has been reviewed, patient examined, no change in status, stable for surgery.  I have reviewed the patient's chart and labs.  Questions were answered to the patient's satisfaction.     Sherren Mocha

## 2020-07-30 ENCOUNTER — Telehealth: Payer: Self-pay | Admitting: Cardiology

## 2020-07-30 ENCOUNTER — Encounter (HOSPITAL_COMMUNITY): Payer: Self-pay | Admitting: Cardiovascular Disease

## 2020-07-30 ENCOUNTER — Other Ambulatory Visit: Payer: Self-pay | Admitting: Cardiology

## 2020-07-30 NOTE — Telephone Encounter (Signed)
Pt aware ok to proceed w/ planned ablation next month. Scheduled for H&P phone call visit w/ Dr. Curt Bears this week. Aware I will be in touch to go over instructions. Patient verbalized understanding and agreeable to plan.

## 2020-07-30 NOTE — Telephone Encounter (Signed)
Please schedule for ablation.

## 2020-07-30 NOTE — Telephone Encounter (Signed)
No c/o active chest pain.  Chest pain - just had one small episode yesterday lasting about 10 minutes, went after one Ntg.  No other symptoms during that time.  Not sure that this my have been related to his AFib.  Heart rate did not go up during that episode.  Patient does have VV scheduled for 07/23/2020 with Dr. Curt Bears in preparation for AFib ablation.

## 2020-07-30 NOTE — Telephone Encounter (Signed)
Holding spot on 10/15 for AFib ablation, LHC this past Friday. Dr. Curt Bears ok to proceed w/ scheduling ablation?

## 2020-07-30 NOTE — Telephone Encounter (Signed)
Pt c/o of Chest Pain: STAT if CP now or developed within 24 hours  1. Are you having CP right now? no  2. Are you experiencing any other symptoms (ex. SOB, nausea, vomiting, sweating)? no  3. How long have you been experiencing CP? yesterday  4. Is your CP continuous or coming and going? Came and went  5. Have you taken Nitroglycerin? Yes   Patient had a cath 07/27/2020 and had chest pain yesterday. Please advise. ?

## 2020-07-30 NOTE — Telephone Encounter (Signed)
Patient states he has not heard anything about scheduling his ablation.

## 2020-07-31 NOTE — Telephone Encounter (Signed)
Recent cath is overall reassuring, I think we fix the afib and then see where the symptoms are at that point. Keep f/u with Dr Loraine Leriche MD

## 2020-07-31 NOTE — Telephone Encounter (Signed)
Patient notified and verbalized understanding. 

## 2020-07-31 NOTE — Telephone Encounter (Signed)
Left message to return call 

## 2020-08-02 ENCOUNTER — Telehealth (INDEPENDENT_AMBULATORY_CARE_PROVIDER_SITE_OTHER): Payer: 59 | Admitting: Cardiology

## 2020-08-02 ENCOUNTER — Encounter: Payer: Self-pay | Admitting: Cardiology

## 2020-08-02 ENCOUNTER — Other Ambulatory Visit: Payer: Self-pay

## 2020-08-02 VITALS — BP 130/72

## 2020-08-02 DIAGNOSIS — I4819 Other persistent atrial fibrillation: Secondary | ICD-10-CM

## 2020-08-02 NOTE — H&P (View-Only) (Signed)
Electrophysiology TeleHealth Note   Due to national recommendations of social distancing due to COVID 19, an audio/video telehealth visit is felt to be most appropriate for this patient at this time.  See Epic message for the patient's consent to telehealth for [facility].   Date:  08/02/2020   ID:  Lucas [name]., DOB Sep 24, 1956, MRN [id number]  Location: patient's home  Provider location: [address] [place]  Evaluation Performed: Follow-up visit  PCP:  Glenda Chroman, MD  Cardiologist:  Carlyle Dolly, MD  Electrophysiologist:  Dr Curt Bears  Chief Complaint:  AF  History of Present Illness:    Lucas [name]. is a 64 y.o. male who presents via audio/video conferencing for a telehealth visit today.  Since last being seen in our clinic, the patient reports doing very well.  Today, he denies symptoms of palpitations, chest pain, shortness of breath,  lower extremity edema, dizziness, presyncope, or syncope.  The patient is otherwise without complaint today.  The patient denies symptoms of fevers, chills, cough, or new SOB worrisome for COVID 19.  He has a history significant for coronary artery disease, type 2 diabetes, hypertension, hyperlipidemia, persistent atrial fibrillation, OSA.  He had a recent left heart catheterization that showed no significant stenosis.  Today, denies symptoms of palpitations, chest pain, shortness of breath, orthopnea, PND, lower extremity edema, claudication, dizziness, presyncope, syncope, bleeding, or neurologic sequela. The patient is tolerating medications without difficulties.  He overall feels well when he is in normal rhythm.  He does note weakness, fatigue, shortness of breath and dizziness when he is in atrial fibrillation.  He is worried about being at work.  He works around [employer] and is worried about falling into them when he gets dizzy.  Aside from that, he has no major complaints.  He is ready for AF  ablation.  Past Medical History:  Diagnosis Date  . Arthritis   . Bilateral carpal tunnel syndrome 08/16/2019  . CAD (coronary artery disease)    a. LHC on 01/29/16 which revealed significant apical LAD stenosis, best treated medically. There was moderate disease in the D2 and mid nondominant RCA.[name] No PCI performed b. 03/2018: repeat cath showing regression of his LAD stenosis now being at 50% with 60% mid RCA stenosis, 45% ostial LPDA stenosis, and 45% ostial second diagonal stenosis.  . Cataract   . Diabetes mellitus    type 2  . GERD (gastroesophageal reflux disease)   . Heart disease   . Hyperlipidemia   . Hypertension   . Obesity   . PAF (paroxysmal atrial fibrillation) ([place]) 01/2016  . Sleep apnea    has CPAP machine but not wear  . Tubular adenoma of colon 08/2012  . 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 ANKLE SURGERY Left 2012   tendon repair  . CARDIAC CATHETERIZATION [name] 01/29/2016   Procedure: Left Heart Cath and Coronary Angiography;  Surgeon: [name], MD;  Location: [place] [facility];  Service: Cardiovascular;  Laterality: [name];  . CARPAL TUNNEL RELEASE Right 09/06/2019   Procedure: CARPAL TUNNEL RELEASE;  Surgeon: Daryll Brod, MD;  Location: [place];  Service: Orthopedics;  Laterality: Right;  . CARPAL TUNNEL RELEASE Left 10/11/2019   Procedure: LEFT CARPAL TUNNEL RELEASE;  Surgeon: Daryll Brod, MD;  Location: [place];  Service: Orthopedics;  Laterality: Left;  IV REGIONAL FOREARM  BLOCK  . CATARACT EXTRACTION W/PHACO  11/11/2012   Procedure: CATARACT EXTRACTION PHACO AND INTRAOCULAR LENS PLACEMENT (IOC);  Surgeon: [name], MD;  Location: [facility];  Service: Ophthalmology;  Laterality: Right;  CDE:  5.56  . CATARACT EXTRACTION W/PHACO Left 12/15/2013   Procedure: CATARACT EXTRACTION PHACO AND INTRAOCULAR LENS PLACEMENT (IOC);  Surgeon: [name], MD;  Location: [facility];  Service: Ophthalmology;  Laterality: Left;  CDE:13.36  . CHOLECYSTECTOMY    . CHONDROPLASTY Right 05/28/2017   Procedure: RIGHT KNEE CHONDROPLASTY;  Surgeon: [name], MD;  Location: [place];  Service: Orthopedics;  Laterality: Right;  . COLONOSCOPY    . CYSTOSCOPY W/ URETERAL STENT PLACEMENT     right  . ELECTROPHYSIOLOGIC STUDY [name] 06/02/2016   Procedure: A-Flutter Ablation;  Surgeon: Danilynn Jemison Meredith Leeds, MD;  Location: [place] [facility];  Service: Cardiovascular;  Laterality: [name];  . KNEE ARTHROSCOPY WITH MEDIAL MENISECTOMY Right 05/28/2017   Procedure: KNEE ARTHROSCOPY WITH MEDIAL MENISECTOMY WITH EXTENSIVE SYNOVECTOMY;  Surgeon: [name], MD;  Location: [place];  Service: Orthopedics;  Laterality: Right;  . LEFT HEART CATH AND CORONARY ANGIOGRAPHY [name] 03/30/2018   Procedure: LEFT HEART CATH AND CORONARY ANGIOGRAPHY;  Surgeon: Leonie Man, MD;  Location: [place] [facility];  Service: Cardiovascular;  Laterality: [name];  . LEFT HEART CATH AND CORONARY ANGIOGRAPHY [name] 07/27/2020   Procedure: LEFT HEART CATH AND CORONARY ANGIOGRAPHY;  Surgeon: Sherren Mocha, MD;  Location: [place] [facility];  Service: Cardiovascular;  Laterality: [name];  . left knee sugery Left    Arthroscopy  . NASAL SINUS SURGERY    . POLYPECTOMY    . TONSILLECTOMY      Current Outpatient Medications  Medication Sig Dispense Refill  . acetaminophen (TYLENOL) 325 MG tablet Take 650 mg by mouth every 6 (six) hours as needed for moderate pain or headache.     [name] atorvastatin (LIPITOR) 80 MG tablet TAKE 1 TABLET (80 MG TOTAL) BY MOUTH DAILY AT 6 PM. 90 tablet 3  . diltiazem (CARDIZEM CD) 240 MG 24 hr capsule Take 1 capsule (240 mg total) by mouth daily. 90 capsule 0  . furosemide (LASIX) 20 MG tablet Take 20 mg by mouth daily as needed for fluid.     [name] glimepiride (AMARYL) 4 MG tablet Take 4 mg by mouth 2 (two) times daily.     [name] HYDROcodone-acetaminophen (NORCO)  5-325 MG tablet Take 1 tablet by mouth every 6 (six) hours as needed. 20 tablet 0  . isosorbide mononitrate (IMDUR) 120 MG 24 hr tablet Take 1 tablet (120 mg total) by mouth daily. 90 tablet 3  . metoprolol succinate (TOPROL-XL) 100 MG 24 hr tablet TAKE 1 AND 1/2 TABLETS BY MOUTH DAILY WITH OR IMMEDIATELY FOLLOWING A MEAL 135 tablet 3  . nitroGLYCERIN (NITROSTAT) 0.4 MG SL tablet Place 1 tablet (0.4 mg total) under the tongue every 5 (five) minutes as needed for chest pain. 25 tablet 3  . OZEMPIC, 0.25 OR 0.5 MG/DOSE, 2 MG/1.5ML SOPN Inject 0.5 mg into the skin every 7 (seven) days.    . pantoprazole (PROTONIX) 40 MG tablet Take 40 mg by mouth in the morning.     [name] SYNJARDY XR 12.03-999 MG TB24 Take 1 tablet by mouth in the morning and at bedtime.    [name] 20 MG TABS tablet TAKE 1 TABLET (20 MG TOTAL) BY MOUTH DAILY WITH SUPPER. 90 tablet 3   No current facility-administered medications for  this visit.    Allergies:   Bee venom and Codeine   Social History:  The patient  reports that he has never smoked. He has never used smokeless tobacco. He reports current alcohol use. He reports that he does not use drugs.   Family History:  The patient's  family history includes Aneurysm in his maternal grandmother and mother; Cirrhosis in his father; Diabetes in his sister; Heart attack in his paternal grandmother and sister; Heart murmur in his mother; Hypertension in his mother; Stroke in his maternal grandmother and sister.   ROS:  Please see the history of present illness.   All other systems are personally reviewed and negative.    Exam:    Vital Signs:  BP 130/72   no acute distress, no shortness of breath.  Labs/Other Tests and Data Reviewed:    Recent Labs: 05/09/2020: ALT 35; B Natriuretic Peptide 148.0 07/04/2020: Magnesium 1.7; TSH 4.434 07/23/2020: BUN 15; Creatinine, Ser 0.80; Hemoglobin 15.1; Platelets 182; Potassium 4.4; Sodium 137   Wt Readings from Last 3 Encounters:   07/27/20 265 lb (120.2 kg)  07/23/20 262 lb (118.8 kg)  07/04/20 260 lb (117.9 kg)     Other studies personally reviewed: Additional studies/ records that were reviewed today include: ECG 07/04/2020 personally reviewed Review of the above records today demonstrates: Atrial fibrillation, rate 106  ASSESSMENT & PLAN:    1.  Persistent atrial fibrillation: Currently on Xarelto and metoprolol.  CHA2DS2-VASc of 4.  Plan for ablation.  Risks and benefits were discussed risk of bleeding, tamponade, heart block, stroke, damage to chest organs.  He understands these risks and is agreed to the procedure.  2.  Coronary artery disease: Coronary CT showed disease.  Repeat catheterization showed no obvious worsening of disease.  No changes.  3.  Obstructive sleep apnea: CPAP compliance encouraged   COVID 19 screen The patient denies symptoms of COVID 19 at this time.  The importance of social distancing was discussed today.  Follow-up: 3 months   Current medicines are reviewed at length with the patient today.   The patient does not have concerns regarding his medicines.  The following changes were made today:  none  Labs/ tests ordered today include:  No orders of the defined types were placed in this encounter.    Patient Risk:  after full review of this patients clinical status, I feel that they are at moderate risk at this time.  Today, I have spent 12 minutes with the patient with telehealth technology discussing AF .    Signed, Morse Brueggemann Meredith Leeds, MD  08/02/2020 3:13 PM     [place] [address] [phone] (office) [phone] (fax)

## 2020-08-02 NOTE — H&P (View-Only) (Signed)
Electrophysiology TeleHealth Note   Due to national recommendations of social distancing due to COVID 19, an audio/video telehealth visit is felt to be most appropriate for this patient at this time.  See Epic message for the patient's consent to telehealth for [facility].   Date:  08/02/2020   ID:  Lucas Creed., DOB 1956/10/11, MRN [id number]  Location: patient's home  Provider location: [address] [place]  Evaluation Performed: Follow-up visit  PCP:  Glenda Chroman, MD  Cardiologist:  Carlyle Dolly, MD  Electrophysiologist:  Dr Curt Bears  Chief Complaint:  AF  History of Present Illness:    Lucas Debski. is a 64 y.o. male who presents via audio/video conferencing for a telehealth visit today.  Since last being seen in our clinic, the patient reports doing very well.  Today, he denies symptoms of palpitations, chest pain, shortness of breath,  lower extremity edema, dizziness, presyncope, or syncope.  The patient is otherwise without complaint today.  The patient denies symptoms of fevers, chills, cough, or new SOB worrisome for COVID 19.  He has a history significant for coronary artery disease, type 2 diabetes, hypertension, hyperlipidemia, persistent atrial fibrillation, OSA.  He had a recent left heart catheterization that showed no significant stenosis.  Today, denies symptoms of palpitations, chest pain, shortness of breath, orthopnea, PND, lower extremity edema, claudication, dizziness, presyncope, syncope, bleeding, or neurologic sequela. The patient is tolerating medications without difficulties.  He overall feels well when he is in normal rhythm.  He does note weakness, fatigue, shortness of breath and dizziness when he is in atrial fibrillation.  He is worried about being at work.  He works around [employer] and is worried about falling into them when he gets dizzy.  Aside from that, he has no major complaints.  He is ready for AF  ablation.  Past Medical History:  Diagnosis Date  . Arthritis   . Bilateral carpal tunnel syndrome 08/16/2019  . CAD (coronary artery disease)    a. LHC on 01/29/16 which revealed significant apical LAD stenosis, best treated medically. There was moderate disease in the D2 and mid nondominant RCA.[name] No PCI performed b. 03/2018: repeat cath showing regression of his LAD stenosis now being at 50% with 60% mid RCA stenosis, 45% ostial LPDA stenosis, and 45% ostial second diagonal stenosis.  . Cataract   . Diabetes mellitus    type 2  . GERD (gastroesophageal reflux disease)   . Heart disease   . Hyperlipidemia   . Hypertension   . Obesity   . PAF (paroxysmal atrial fibrillation) ([place]) 01/2016  . Sleep apnea    has CPAP machine but not wear  . Tubular adenoma of colon 08/2012  . 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 ANKLE SURGERY Left 2012   tendon repair  . CARDIAC CATHETERIZATION [name] 01/29/2016   Procedure: Left Heart Cath and Coronary Angiography;  Surgeon: [name], MD;  Location: [place] [facility];  Service: Cardiovascular;  Laterality: [name];  . CARPAL TUNNEL RELEASE Right 09/06/2019   Procedure: CARPAL TUNNEL RELEASE;  Surgeon: Daryll Brod, MD;  Location: [place];  Service: Orthopedics;  Laterality: Right;  . CARPAL TUNNEL RELEASE Left 10/11/2019   Procedure: LEFT CARPAL TUNNEL RELEASE;  Surgeon: Daryll Brod, MD;  Location: [place];  Service: Orthopedics;  Laterality: Left;  IV REGIONAL FOREARM  BLOCK  . CATARACT EXTRACTION W/PHACO  11/11/2012   Procedure: CATARACT EXTRACTION PHACO AND INTRAOCULAR LENS PLACEMENT (IOC);  Surgeon: [name], MD;  Location: [facility];  Service: Ophthalmology;  Laterality: Right;  CDE:  5.56  . CATARACT EXTRACTION W/PHACO Left 12/15/2013   Procedure: CATARACT EXTRACTION PHACO AND INTRAOCULAR LENS PLACEMENT (IOC);  Surgeon: [name], MD;  Location: [facility];  Service: Ophthalmology;  Laterality: Left;  CDE:13.36  . CHOLECYSTECTOMY    . CHONDROPLASTY Right 05/28/2017   Procedure: RIGHT KNEE CHONDROPLASTY;  Surgeon: [name], MD;  Location: [place];  Service: Orthopedics;  Laterality: Right;  . COLONOSCOPY    . CYSTOSCOPY W/ URETERAL STENT PLACEMENT     right  . ELECTROPHYSIOLOGIC STUDY [name] 06/02/2016   Procedure: A-Flutter Ablation;  Surgeon: Jelisha Weed Meredith Leeds, MD;  Location: [place] [facility];  Service: Cardiovascular;  Laterality: [name];  . KNEE ARTHROSCOPY WITH MEDIAL MENISECTOMY Right 05/28/2017   Procedure: KNEE ARTHROSCOPY WITH MEDIAL MENISECTOMY WITH EXTENSIVE SYNOVECTOMY;  Surgeon: [name], MD;  Location: [place];  Service: Orthopedics;  Laterality: Right;  . LEFT HEART CATH AND CORONARY ANGIOGRAPHY [name] 03/30/2018   Procedure: LEFT HEART CATH AND CORONARY ANGIOGRAPHY;  Surgeon: Leonie Man, MD;  Location: [place] [facility];  Service: Cardiovascular;  Laterality: [name];  . LEFT HEART CATH AND CORONARY ANGIOGRAPHY [name] 07/27/2020   Procedure: LEFT HEART CATH AND CORONARY ANGIOGRAPHY;  Surgeon: Sherren Mocha, MD;  Location: [place] [facility];  Service: Cardiovascular;  Laterality: [name];  . left knee sugery Left    Arthroscopy  . NASAL SINUS SURGERY    . POLYPECTOMY    . TONSILLECTOMY      Current Outpatient Medications  Medication Sig Dispense Refill  . acetaminophen (TYLENOL) 325 MG tablet Take 650 mg by mouth every 6 (six) hours as needed for moderate pain or headache.     [name] atorvastatin (LIPITOR) 80 MG tablet TAKE 1 TABLET (80 MG TOTAL) BY MOUTH DAILY AT 6 PM. 90 tablet 3  . diltiazem (CARDIZEM CD) 240 MG 24 hr capsule Take 1 capsule (240 mg total) by mouth daily. 90 capsule 0  . furosemide (LASIX) 20 MG tablet Take 20 mg by mouth daily as needed for fluid.     [name] glimepiride (AMARYL) 4 MG tablet Take 4 mg by mouth 2 (two) times daily.     [name] HYDROcodone-acetaminophen (NORCO)  5-325 MG tablet Take 1 tablet by mouth every 6 (six) hours as needed. 20 tablet 0  . isosorbide mononitrate (IMDUR) 120 MG 24 hr tablet Take 1 tablet (120 mg total) by mouth daily. 90 tablet 3  . metoprolol succinate (TOPROL-XL) 100 MG 24 hr tablet TAKE 1 AND 1/2 TABLETS BY MOUTH DAILY WITH OR IMMEDIATELY FOLLOWING A MEAL 135 tablet 3  . nitroGLYCERIN (NITROSTAT) 0.4 MG SL tablet Place 1 tablet (0.4 mg total) under the tongue every 5 (five) minutes as needed for chest pain. 25 tablet 3  . OZEMPIC, 0.25 OR 0.5 MG/DOSE, 2 MG/1.5ML SOPN Inject 0.5 mg into the skin every 7 (seven) days.    . pantoprazole (PROTONIX) 40 MG tablet Take 40 mg by mouth in the morning.     [name] SYNJARDY XR 12.03-999 MG TB24 Take 1 tablet by mouth in the morning and at bedtime.    [name] 20 MG TABS tablet TAKE 1 TABLET (20 MG TOTAL) BY MOUTH DAILY WITH SUPPER. 90 tablet 3   No current facility-administered medications for  this visit.    Allergies:   Bee venom and Codeine   Social History:  The patient  reports that he has never smoked. He has never used smokeless tobacco. He reports current alcohol use. He reports that he does not use drugs.   Family History:  The patient's  family history includes Aneurysm in his maternal grandmother and mother; Cirrhosis in his father; Diabetes in his sister; Heart attack in his paternal grandmother and sister; Heart murmur in his mother; Hypertension in his mother; Stroke in his maternal grandmother and sister.   ROS:  Please see the history of present illness.   All other systems are personally reviewed and negative.    Exam:    Vital Signs:  BP 130/72   no acute distress, no shortness of breath.  Labs/Other Tests and Data Reviewed:    Recent Labs: 05/09/2020: ALT 35; B Natriuretic Peptide 148.0 07/04/2020: Magnesium 1.7; TSH 4.434 07/23/2020: BUN 15; Creatinine, Ser 0.80; Hemoglobin 15.1; Platelets 182; Potassium 4.4; Sodium 137   Wt Readings from Last 3 Encounters:   07/27/20 265 lb (120.2 kg)  07/23/20 262 lb (118.8 kg)  07/04/20 260 lb (117.9 kg)     Other studies personally reviewed: Additional studies/ records that were reviewed today include: ECG 07/04/2020 personally reviewed Review of the above records today demonstrates: Atrial fibrillation, rate 106  ASSESSMENT & PLAN:    1.  Persistent atrial fibrillation: Currently on Xarelto and metoprolol.  CHA2DS2-VASc of 4.  Plan for ablation.  Risks and benefits were discussed risk of bleeding, tamponade, heart block, stroke, damage to chest organs.  He understands these risks and is agreed to the procedure.  2.  Coronary artery disease: Coronary CT showed disease.  Repeat catheterization showed no obvious worsening of disease.  No changes.  3.  Obstructive sleep apnea: CPAP compliance encouraged   COVID 19 screen The patient denies symptoms of COVID 19 at this time.  The importance of social distancing was discussed today.  Follow-up: 3 months   Current medicines are reviewed at length with the patient today.   The patient does not have concerns regarding his medicines.  The following changes were made today:  none  Labs/ tests ordered today include:  No orders of the defined types were placed in this encounter.    Patient Risk:  after full review of this patients clinical status, I feel that they are at moderate risk at this time.  Today, I have spent 12 minutes with the patient with telehealth technology discussing AF .    Signed, Sapphire Tygart Meredith Leeds, MD  08/02/2020 3:13 PM     [place] [place] [address] [phone] (office) [phone] (fax)

## 2020-08-02 NOTE — Progress Notes (Signed)
Electrophysiology TeleHealth Note   Due to national recommendations of social distancing due to COVID 19, an audio/video telehealth visit is felt to be most appropriate for this patient at this time.  See Epic message for the patient's consent to telehealth for [facility].   Date:  08/02/2020   ID:  Lucas Creed., DOB [birth year], MRN [id number]  Location: patient's home  Provider location: [address] [place]  Evaluation Performed: Follow-up visit  PCP:  Glenda Chroman, MD  Cardiologist:  Carlyle Dolly, MD  Electrophysiologist:  Dr Curt Bears  Chief Complaint:  AF  History of Present Illness:    Lucas Echavarria. is a 64 y.o. male who presents via audio/video conferencing for a telehealth visit today.  Since last being seen in our clinic, the patient reports doing very well.  Today, he denies symptoms of palpitations, chest pain, shortness of breath,  lower extremity edema, dizziness, presyncope, or syncope.  The patient is otherwise without complaint today.  The patient denies symptoms of fevers, chills, cough, or new SOB worrisome for COVID 19.  He has a history significant for coronary artery disease, type 2 diabetes, hypertension, hyperlipidemia, persistent atrial fibrillation, OSA.  He had a recent left heart catheterization that showed no significant stenosis.  Today, denies symptoms of palpitations, chest pain, shortness of breath, orthopnea, PND, lower extremity edema, claudication, dizziness, presyncope, syncope, bleeding, or neurologic sequela. The patient is tolerating medications without difficulties.  He overall feels well when he is in normal rhythm.  He does note weakness, fatigue, shortness of breath and dizziness when he is in atrial fibrillation.  He is worried about being at work.  He works around [employer] and is worried about falling into them when he gets dizzy.  Aside from that, he has no major complaints.  He is ready for AF  ablation.  Past Medical History:  Diagnosis Date   Arthritis    Bilateral carpal tunnel syndrome 08/16/2019   CAD (coronary artery disease)    a. LHC on 01/29/16 which revealed significant apical LAD stenosis, best treated medically. There was moderate disease in the D2 and mid nondominant RCA.[name] No PCI performed b. 03/2018: repeat cath showing regression of his LAD stenosis now being at 50% with 60% mid RCA stenosis, 45% ostial LPDA stenosis, and 45% ostial second diagonal stenosis.   Cataract    Diabetes mellitus    type 2   GERD (gastroesophageal reflux disease)    Heart disease    Hyperlipidemia    Hypertension    Obesity    PAF (paroxysmal atrial fibrillation) ([place]) 01/2016   Sleep apnea    has CPAP machine but not wear   Tubular adenoma of colon 08/2012   Ulnar neuropathy at elbow 08/16/2019   Bilateral    Past Surgical History:  Procedure Laterality Date   ANKLE SURGERY Left 2012   tendon repair   CARDIAC CATHETERIZATION [name] 01/29/2016   Procedure: Left Heart Cath and Coronary Angiography;  Surgeon: [name], MD;  Location: [place] [facility];  Service: Cardiovascular;  Laterality: [name];   CARPAL TUNNEL RELEASE Right 09/06/2019   Procedure: CARPAL TUNNEL RELEASE;  Surgeon: Daryll Brod, MD;  Location: [place];  Service: Orthopedics;  Laterality: Right;   CARPAL TUNNEL RELEASE Left 10/11/2019   Procedure: LEFT CARPAL TUNNEL RELEASE;  Surgeon: Daryll Brod, MD;  Location: [place];  Service: Orthopedics;  Laterality: Left;  IV REGIONAL FOREARM  BLOCK   CATARACT EXTRACTION W/PHACO  11/11/2012   Procedure: CATARACT EXTRACTION PHACO AND INTRAOCULAR LENS PLACEMENT (IOC);  Surgeon: [name], MD;  Location: [facility];  Service: Ophthalmology;  Laterality: Right;  CDE:  5.56   CATARACT EXTRACTION W/PHACO Left 12/15/2013   Procedure: CATARACT EXTRACTION PHACO AND INTRAOCULAR LENS PLACEMENT (IOC);  Surgeon: [name], MD;  [facility];  Service: Ophthalmology;  Laterality: Left;  CDE:13.36   CHOLECYSTECTOMY     CHONDROPLASTY Right 05/28/2017   Procedure: RIGHT KNEE CHONDROPLASTY;  Surgeon: [name], MD;  Location: [place];  Service: Orthopedics;  Laterality: Right;   COLONOSCOPY     CYSTOSCOPY W/ URETERAL STENT PLACEMENT     right   ELECTROPHYSIOLOGIC STUDY [name] 06/02/2016   Procedure: A-Flutter Ablation;  Surgeon: Chestina Komatsu Meredith Leeds, MD;  Location: [place] [facility];  Service: Cardiovascular;  Laterality: [name];   KNEE ARTHROSCOPY WITH MEDIAL MENISECTOMY Right 05/28/2017   Procedure: KNEE ARTHROSCOPY WITH MEDIAL MENISECTOMY WITH EXTENSIVE SYNOVECTOMY;  Surgeon: [name], MD;  Location: [place];  Service: Orthopedics;  Laterality: Right;   LEFT HEART CATH AND CORONARY ANGIOGRAPHY [name] 03/30/2018   Procedure: LEFT HEART CATH AND CORONARY ANGIOGRAPHY;  Surgeon: Leonie Man, MD;  Location: [place] [facility];  Service: Cardiovascular;  Laterality: [name];   LEFT HEART CATH AND CORONARY ANGIOGRAPHY [name] 07/27/2020   Procedure: LEFT HEART CATH AND CORONARY ANGIOGRAPHY;  Surgeon: Sherren Mocha, MD;  Location: [place] [facility];  Service: Cardiovascular;  Laterality: [name];   left knee sugery Left    Arthroscopy   NASAL SINUS SURGERY     POLYPECTOMY     TONSILLECTOMY      Current Outpatient Medications  Medication Sig Dispense Refill   acetaminophen (TYLENOL) 325 MG tablet Take 650 mg by mouth every 6 (six) hours as needed for moderate pain or headache.      atorvastatin (LIPITOR) 80 MG tablet TAKE 1 TABLET (80 MG TOTAL) BY MOUTH DAILY AT 6 PM. 90 tablet 3   diltiazem (CARDIZEM CD) 240 MG 24 hr capsule Take 1 capsule (240 mg total) by mouth daily. 90 capsule 0   furosemide (LASIX) 20 MG tablet Take 20 mg by mouth daily as needed for fluid.      glimepiride (AMARYL) 4 MG tablet Take 4 mg by mouth 2 (two) times daily.      HYDROcodone-acetaminophen (NORCO)  5-325 MG tablet Take 1 tablet by mouth every 6 (six) hours as needed. 20 tablet 0   isosorbide mononitrate (IMDUR) 120 MG 24 hr tablet Take 1 tablet (120 mg total) by mouth daily. 90 tablet 3   metoprolol succinate (TOPROL-XL) 100 MG 24 hr tablet TAKE 1 AND 1/2 TABLETS BY MOUTH DAILY WITH OR IMMEDIATELY FOLLOWING A MEAL 135 tablet 3   nitroGLYCERIN (NITROSTAT) 0.4 MG SL tablet Place 1 tablet (0.4 mg total) under the tongue every 5 (five) minutes as needed for chest pain. 25 tablet 3   OZEMPIC, 0.25 OR 0.5 MG/DOSE, 2 MG/1.5ML SOPN Inject 0.5 mg into the skin every 7 (seven) days.     pantoprazole (PROTONIX) 40 MG tablet Take 40 mg by mouth in the morning.      SYNJARDY XR 12.03-999 MG TB24 Take 1 tablet by mouth in the morning and at bedtime.     XARELTO 20 MG TABS tablet TAKE 1 TABLET (20 MG TOTAL) BY MOUTH DAILY WITH SUPPER. 90 tablet 3   No current facility-administered medications for  this visit.    Allergies:   Bee venom and Codeine   Social History:  The patient  reports that he has never smoked. He has never used smokeless tobacco. He reports current alcohol use. He reports that he does not use drugs.   Family History:  The patient's  family history includes Aneurysm in his maternal grandmother and mother; Cirrhosis in his father; Diabetes in his sister; Heart attack in his paternal grandmother and sister; Heart murmur in his mother; Hypertension in his mother; Stroke in his maternal grandmother and sister.   ROS:  Please see the history of present illness.   All other systems are personally reviewed and negative.    Exam:    Vital Signs:  BP 130/72   no acute distress, no shortness of breath.  Labs/Other Tests and Data Reviewed:    Recent Labs: 05/09/2020: ALT 35; B Natriuretic Peptide 148.0 07/04/2020: Magnesium 1.7; TSH 4.434 07/23/2020: BUN 15; Creatinine, Ser 0.80; Hemoglobin 15.1; Platelets 182; Potassium 4.4; Sodium 137   Wt Readings from Last 3 Encounters:   07/27/20 265 lb (120.2 kg)  07/23/20 262 lb (118.8 kg)  07/04/20 260 lb (117.9 kg)     Other studies personally reviewed: Additional studies/ records that were reviewed today include: ECG 07/04/2020 personally reviewed Review of the above records today demonstrates: Atrial fibrillation, rate 106  ASSESSMENT & PLAN:    1.  Persistent atrial fibrillation: Currently on Xarelto and metoprolol.  CHA2DS2-VASc of 4.  Plan for ablation.  Risks and benefits were discussed risk of bleeding, tamponade, heart block, stroke, damage to chest organs.  He understands these risks and is agreed to the procedure.  2.  Coronary artery disease: Coronary CT showed disease.  Repeat catheterization showed no obvious worsening of disease.  No changes.  3.  Obstructive sleep apnea: CPAP compliance encouraged   COVID 19 screen The patient denies symptoms of COVID 19 at this time.  The importance of social distancing was discussed today.  Follow-up: 3 months   Current medicines are reviewed at length with the patient today.   The patient does not have concerns regarding his medicines.  The following changes were made today:  none  Labs/ tests ordered today include:  No orders of the defined types were placed in this encounter.    Patient Risk:  after full review of this patients clinical status, I feel that they are at moderate risk at this time.  Today, I have spent 12 minutes with the patient with telehealth technology discussing AF .    Signed, Tuleen Mandelbaum Meredith Leeds, MD  08/02/2020 3:13 PM     [place] [place] [address] [phone] (office) [phone] (fax)

## 2020-08-09 DIAGNOSIS — Z0279 Encounter for issue of other medical certificate: Secondary | ICD-10-CM

## 2020-08-10 ENCOUNTER — Telehealth: Payer: Self-pay | Admitting: *Deleted

## 2020-08-10 DIAGNOSIS — I4819 Other persistent atrial fibrillation: Secondary | ICD-10-CM

## 2020-08-10 DIAGNOSIS — Z01812 Encounter for preprocedural laboratory examination: Secondary | ICD-10-CM

## 2020-08-10 NOTE — Telephone Encounter (Signed)
Called pt, reviewed procedure instructions. Aware I will send instructions via mychart Monday.

## 2020-08-21 ENCOUNTER — Other Ambulatory Visit (HOSPITAL_COMMUNITY): Payer: 59

## 2020-08-22 ENCOUNTER — Other Ambulatory Visit: Payer: 59 | Admitting: *Deleted

## 2020-08-22 ENCOUNTER — Other Ambulatory Visit: Payer: Self-pay

## 2020-08-22 ENCOUNTER — Other Ambulatory Visit (HOSPITAL_COMMUNITY)
Admission: RE | Admit: 2020-08-22 | Discharge: 2020-08-22 | Disposition: A | Discharge status: Home / Self Care | Payer: 59 | Source: Ambulatory Visit | Attending: Cardiology | Admitting: Cardiology

## 2020-08-22 DIAGNOSIS — Z01812 Encounter for preprocedural laboratory examination: Secondary | ICD-10-CM | POA: Insufficient documentation

## 2020-08-22 DIAGNOSIS — Z20822 Contact with and (suspected) exposure to covid-19: Secondary | ICD-10-CM | POA: Diagnosis not present

## 2020-08-22 DIAGNOSIS — I4819 Other persistent atrial fibrillation: Secondary | ICD-10-CM

## 2020-08-22 LAB — BASIC METABOLIC PANEL
BUN/Creatinine Ratio: 24 (ref 10–24)
BUN: 20 mg/dL (ref 8–27)
CO2: 26 mmol/L (ref 20–29)
Calcium: 9 mg/dL (ref 8.6–10.2)
Chloride: 105 mmol/L (ref 96–106)
Creatinine, Ser: 0.85 mg/dL (ref 0.76–1.27)
GFR calc Af Amer: 105 mL/min/{1.73_m2} (ref >59)
GFR calc non Af Amer: 91 mL/min/{1.73_m2} (ref >59)
Glucose: 106 mg/dL — ABNORMAL HIGH (ref 65–99)
Potassium: 3.9 mmol/L (ref 3.5–5.2)
Sodium: 139 mmol/L (ref 134–144)

## 2020-08-22 LAB — CBC
Hematocrit: 44.1 % (ref 37.5–51.0)
Hemoglobin: 15.1 g/dL (ref 13.0–17.7)
MCH: 31.1 pg (ref 26.6–33.0)
MCHC: 34.2 g/dL (ref 31.5–35.7)
MCV: 91 fL (ref 79–97)
Platelets: 178 10*3/uL (ref 150–450)
RBC: 4.85 x10E6/uL (ref 4.14–5.80)
RDW: 15.7 % — ABNORMAL HIGH (ref 11.6–15.4)
WBC: 10.1 10*3/uL (ref 3.4–10.8)

## 2020-08-22 LAB — SARS CORONAVIRUS 2 (TAT 6-24 HRS): SARS Coronavirus 2: NEGATIVE

## 2020-08-23 ENCOUNTER — Ambulatory Visit (HOSPITAL_COMMUNITY): Payer: 59 | Admitting: Anesthesiology

## 2020-08-23 ENCOUNTER — Other Ambulatory Visit: Payer: Self-pay

## 2020-08-23 ENCOUNTER — Encounter (HOSPITAL_COMMUNITY)
Admission: RE | Disposition: A | Discharge status: Home / Self Care | Payer: 59 | Source: Home / Self Care | Attending: Cardiovascular Disease

## 2020-08-23 ENCOUNTER — Ambulatory Visit: Payer: 59 | Admitting: Cardiology

## 2020-08-23 ENCOUNTER — Ambulatory Visit (HOSPITAL_BASED_OUTPATIENT_CLINIC_OR_DEPARTMENT_OTHER)
Admission: RE | Admit: 2020-08-23 | Discharge: 2020-08-23 | Disposition: A | Discharge status: Home / Self Care | Payer: 59 | Source: Ambulatory Visit | Attending: Cardiovascular Disease | Admitting: Cardiovascular Disease

## 2020-08-23 ENCOUNTER — Encounter (HOSPITAL_COMMUNITY): Payer: Self-pay | Admitting: Cardiovascular Disease

## 2020-08-23 ENCOUNTER — Ambulatory Visit (HOSPITAL_COMMUNITY)
Admission: RE | Admit: 2020-08-23 | Discharge: 2020-08-23 | Disposition: A | Discharge status: Home / Self Care | Payer: 59 | Attending: Cardiovascular Disease | Admitting: Cardiovascular Disease

## 2020-08-23 DIAGNOSIS — I2510 Atherosclerotic heart disease of native coronary artery without angina pectoris: Secondary | ICD-10-CM | POA: Diagnosis not present

## 2020-08-23 DIAGNOSIS — Z79899 Other long term (current) drug therapy: Secondary | ICD-10-CM | POA: Diagnosis not present

## 2020-08-23 DIAGNOSIS — E785 Hyperlipidemia, unspecified: Secondary | ICD-10-CM | POA: Insufficient documentation

## 2020-08-23 DIAGNOSIS — I10 Essential (primary) hypertension: Secondary | ICD-10-CM | POA: Diagnosis not present

## 2020-08-23 DIAGNOSIS — I482 Chronic atrial fibrillation, unspecified: Secondary | ICD-10-CM

## 2020-08-23 DIAGNOSIS — E669 Obesity, unspecified: Secondary | ICD-10-CM | POA: Insufficient documentation

## 2020-08-23 DIAGNOSIS — E119 Type 2 diabetes mellitus without complications: Secondary | ICD-10-CM | POA: Diagnosis not present

## 2020-08-23 DIAGNOSIS — I4819 Other persistent atrial fibrillation: Secondary | ICD-10-CM | POA: Insufficient documentation

## 2020-08-23 DIAGNOSIS — G4733 Obstructive sleep apnea (adult) (pediatric): Secondary | ICD-10-CM | POA: Insufficient documentation

## 2020-08-23 DIAGNOSIS — Z7901 Long term (current) use of anticoagulants: Secondary | ICD-10-CM | POA: Diagnosis not present

## 2020-08-23 DIAGNOSIS — K219 Gastro-esophageal reflux disease without esophagitis: Secondary | ICD-10-CM | POA: Insufficient documentation

## 2020-08-23 DIAGNOSIS — Z6838 Body mass index (BMI) 38.0-38.9, adult: Secondary | ICD-10-CM | POA: Insufficient documentation

## 2020-08-23 DIAGNOSIS — I639 Cerebral infarction, unspecified: Secondary | ICD-10-CM

## 2020-08-23 DIAGNOSIS — Z7984 Long term (current) use of oral hypoglycemic drugs: Secondary | ICD-10-CM | POA: Diagnosis not present

## 2020-08-23 DIAGNOSIS — Z885 Allergy status to narcotic agent status: Secondary | ICD-10-CM | POA: Diagnosis not present

## 2020-08-23 DIAGNOSIS — I480 Paroxysmal atrial fibrillation: Secondary | ICD-10-CM

## 2020-08-23 HISTORY — PX: TEE WITHOUT CARDIOVERSION: SHX5443

## 2020-08-23 LAB — GLUCOSE, CAPILLARY: Glucose-Capillary: 141 mg/dL — ABNORMAL HIGH (ref 70–99)

## 2020-08-23 SURGERY — ECHOCARDIOGRAM, TRANSESOPHAGEAL
Anesthesia: Monitor Anesthesia Care

## 2020-08-23 MED ORDER — BUTAMBEN-TETRACAINE-BENZOCAINE 2-2-14 % EX AERO
INHALATION_SPRAY | CUTANEOUS | Status: DC | PRN
  Administered 2020-08-23: 2 via TOPICAL

## 2020-08-23 MED ORDER — SODIUM CHLORIDE 0.9 % IV SOLN: INTRAVENOUS | Status: DC

## 2020-08-23 MED ORDER — LIDOCAINE HCL (CARDIAC) PF 50 MG/5ML IV SOSY
PREFILLED_SYRINGE | INTRAVENOUS | Status: DC | PRN
  Administered 2020-08-23: 30 mg via INTRAVENOUS

## 2020-08-23 MED ORDER — LACTATED RINGERS IV SOLN: INTRAVENOUS | Status: DC

## 2020-08-23 MED ORDER — PROPOFOL 10 MG/ML IV BOLUS
INTRAVENOUS | Status: DC | PRN
  Administered 2020-08-23: 36 mg via INTRAVENOUS

## 2020-08-23 MED ORDER — PROPOFOL 500 MG/50ML IV EMUL
INTRAVENOUS | Status: DC | PRN
  Administered 2020-08-23: 100 ug/kg/min via INTRAVENOUS

## 2020-08-23 MED ORDER — LACTATED RINGERS IV SOLN: INTRAVENOUS | Status: DC | PRN

## 2020-08-23 NOTE — Interval H&P Note (Signed)
History and Physical Interval Note:  08/23/2020 10:01 AM  Lucas [name].  has presented today for surgery, with the diagnosis of A-FIB.  The various methods of treatment have been discussed with the patient and family. After consideration of risks, benefits and other options for treatment, the patient has consented to  Procedure(s): TRANSESOPHAGEAL ECHOCARDIOGRAM (TEE) ([name]) as a surgical intervention.  The patient's history has been reviewed, patient examined, no change in status, stable for surgery.  I have reviewed the patient's chart and labs.  Questions were answered to the patient's satisfaction.     Mertie Moores

## 2020-08-23 NOTE — Transfer of Care (Signed)
Immediate Anesthesia Transfer of Care Note  Patient: Lucas [name].  Procedure(s) Performed: TRANSESOPHAGEAL ECHOCARDIOGRAM (TEE) ([name] )  Patient Location: Endoscopy Unit  Anesthesia Type:MAC  Level of Consciousness: awake, alert  and oriented  Airway & Oxygen Therapy: Patient Spontanous Breathing and Patient connected to nasal cannula oxygen  Post-op Assessment: Report given to RN and Post -op Vital signs reviewed and stable  Post vital signs: Reviewed and stable  Last Vitals:  Vitals Value Taken Time  BP    Temp    Pulse    Resp    SpO2      Last Pain:  Vitals:   08/23/20 0924  TempSrc: Oral  PainSc: 0-No pain         Complications: No complications documented.

## 2020-08-23 NOTE — Anesthesia Preprocedure Evaluation (Addendum)
Anesthesia Evaluation  Patient identified by MRN, date of birth, ID band Patient awake    Reviewed: Allergy & Precautions, NPO status , Patient's Chart, lab work & pertinent test results  Airway Mallampati: II  TM Distance: >3 FB Neck ROM: Full    Dental no notable dental hx. (+) Teeth Intact   Pulmonary sleep apnea ,    Pulmonary exam normal breath sounds clear to auscultation       Cardiovascular Exercise Tolerance: Good METS: 3 - Mets hypertension, Pt. on home beta blockers and Pt. on medications + CAD  + dysrhythmias Atrial Fibrillation  Rhythm:Irregular Rate:Normal     Neuro/Psych  Neuromuscular disease (carpal tunnel s/p repair) negative psych ROS   GI/Hepatic Neg liver ROS, GERD  ,  Endo/Other  diabetes  Renal/GU   negative genitourinary   Musculoskeletal  (+) Arthritis ,   Abdominal (+) + obese,   Peds  Hematology negative hematology ROS (+)   Anesthesia Other Findings   Reproductive/Obstetrics negative OB ROS                            Anesthesia Physical  Anesthesia Plan  ASA: III  Anesthesia Plan: MAC   Post-op Pain Management:    Induction:   PONV Risk Score and Plan: 1 and Treatment may vary due to age or medical condition, TIVA and Propofol infusion  Airway Management Planned: Nasal Cannula and Natural Airway  Additional Equipment: None  Intra-op Plan:   Post-operative Plan:   Informed Consent: I have reviewed the patients History and Physical, chart, labs and discussed the procedure including the risks, benefits and alternatives for the proposed anesthesia with the patient or authorized representative who has indicated his/her understanding and acceptance.       Plan Discussed with: CRNA and Anesthesiologist  Anesthesia Plan Comments:         Anesthesia Quick Evaluation

## 2020-08-23 NOTE — Anesthesia Procedure Notes (Signed)
Procedure Name: MAC Date/Time: 08/23/2020 10:15 AM Performed by: Eligha Bridegroom, CRNA Pre-anesthesia Checklist: Emergency Drugs available, Suction available, Timeout performed, Patient being monitored and Patient identified Patient Re-evaluated:Patient Re-evaluated prior to induction Oxygen Delivery Method: Nasal cannula Preoxygenation: Pre-oxygenation with 100% oxygen Induction Type: IV induction

## 2020-08-23 NOTE — CV Procedure (Signed)
    Transesophageal Echocardiogram Note  Zyren Sevigny [id number] 06/29/1956  Procedure: Transesophageal Echocardiogram Indications: CVA , pre Afib ablation   Procedure Details Consent: Obtained Time Out: Verified patient identification, verified procedure, site/side was marked, verified correct patient position, special equipment/implants available, Radiology Safety Procedures followed,  medications/allergies/relevent history reviewed, required imaging and test results available.  Performed  Medications:  During this procedure the patient is administered a propfol drip by Delfino Lovett, CRNA Total of 180 mg Propofol was given .  The patient's heart rate, blood pressure, and oxygen saturation are monitored continuously during the procedure. The period of conscious sedation is  30  minutes, of which I was present face-to-face 100% of this time.  Left Ventrical:  Normal LV function  Mitral Valve: normal, no MR   Aortic Valve:  3 leaflet valve,  No AS or AI   Tricuspid Valve: mild TR   Pulmonic Valve: mild PI   Left Atrium/ Left atrial appendage: no thrombi see   Atrial septum: intact visually   Aorta: normal    Complications: No apparent complications Patient did tolerate procedure well.   Thayer Headings, Brooke Bonito., MD, [facility] 08/23/2020, 10:31 AM

## 2020-08-23 NOTE — Anesthesia Postprocedure Evaluation (Signed)
Anesthesia Post Note  Patient: Lucas [name].  Procedure(s) Performed: TRANSESOPHAGEAL ECHOCARDIOGRAM (TEE) ([name] )     Patient location during evaluation: Endoscopy Anesthesia Type: MAC Level of consciousness: awake and alert Pain management: pain level controlled Vital Signs Assessment: post-procedure vital signs reviewed and stable Respiratory status: spontaneous breathing, nonlabored ventilation and respiratory function stable Cardiovascular status: blood pressure returned to baseline and stable Postop Assessment: no apparent nausea or vomiting Anesthetic complications: no   No complications documented.  Last Vitals:  Vitals:   08/23/20 1039 08/23/20 1050  BP: 113/72 134/66  Pulse: 67 61  Resp: 15 20  Temp: (!) 36.3 C   SpO2: 95% 95%    Last Pain:  Vitals:   08/23/20 1050  TempSrc:   PainSc: 0-No pain                 Merlinda Frederick

## 2020-08-23 NOTE — Discharge Instructions (Signed)

## 2020-08-24 ENCOUNTER — Ambulatory Visit (HOSPITAL_COMMUNITY): Payer: 59 | Admitting: Certified Registered"

## 2020-08-24 ENCOUNTER — Ambulatory Visit (HOSPITAL_COMMUNITY)
Admission: RE | Admit: 2020-08-24 | Discharge: 2020-08-24 | Disposition: A | Discharge status: Home / Self Care | Payer: 59 | Attending: Cardiology | Admitting: Cardiology

## 2020-08-24 ENCOUNTER — Encounter (HOSPITAL_COMMUNITY)
Admission: RE | Disposition: A | Discharge status: Home / Self Care | Payer: 59 | Source: Home / Self Care | Attending: Cardiology

## 2020-08-24 ENCOUNTER — Encounter (HOSPITAL_COMMUNITY): Payer: Self-pay | Admitting: Cardiology

## 2020-08-24 DIAGNOSIS — Z7984 Long term (current) use of oral hypoglycemic drugs: Secondary | ICD-10-CM | POA: Insufficient documentation

## 2020-08-24 DIAGNOSIS — I10 Essential (primary) hypertension: Secondary | ICD-10-CM | POA: Diagnosis not present

## 2020-08-24 DIAGNOSIS — I2510 Atherosclerotic heart disease of native coronary artery without angina pectoris: Secondary | ICD-10-CM | POA: Diagnosis not present

## 2020-08-24 DIAGNOSIS — Z79899 Other long term (current) drug therapy: Secondary | ICD-10-CM | POA: Insufficient documentation

## 2020-08-24 DIAGNOSIS — Z7901 Long term (current) use of anticoagulants: Secondary | ICD-10-CM | POA: Insufficient documentation

## 2020-08-24 DIAGNOSIS — E119 Type 2 diabetes mellitus without complications: Secondary | ICD-10-CM | POA: Diagnosis not present

## 2020-08-24 DIAGNOSIS — E669 Obesity, unspecified: Secondary | ICD-10-CM | POA: Diagnosis not present

## 2020-08-24 DIAGNOSIS — K219 Gastro-esophageal reflux disease without esophagitis: Secondary | ICD-10-CM | POA: Insufficient documentation

## 2020-08-24 DIAGNOSIS — I4819 Other persistent atrial fibrillation: Secondary | ICD-10-CM | POA: Insufficient documentation

## 2020-08-24 DIAGNOSIS — Z6836 Body mass index (BMI) 36.0-36.9, adult: Secondary | ICD-10-CM | POA: Insufficient documentation

## 2020-08-24 DIAGNOSIS — G4733 Obstructive sleep apnea (adult) (pediatric): Secondary | ICD-10-CM | POA: Diagnosis not present

## 2020-08-24 DIAGNOSIS — E785 Hyperlipidemia, unspecified: Secondary | ICD-10-CM | POA: Insufficient documentation

## 2020-08-24 HISTORY — PX: ATRIAL FIBRILLATION ABLATION: EP1191

## 2020-08-24 LAB — POCT ACTIVATED CLOTTING TIME
Activated Clotting Time: 257 seconds
Activated Clotting Time: 362 seconds
Activated Clotting Time: 340 seconds

## 2020-08-24 LAB — GLUCOSE, CAPILLARY
Glucose-Capillary: 117 mg/dL — ABNORMAL HIGH (ref 70–99)
Glucose-Capillary: 149 mg/dL — ABNORMAL HIGH (ref 70–99)
Glucose-Capillary: 161 mg/dL — ABNORMAL HIGH (ref 70–99)

## 2020-08-24 SURGERY — ATRIAL FIBRILLATION ABLATION
Anesthesia: General

## 2020-08-24 MED ORDER — DEXAMETHASONE SODIUM PHOSPHATE 10 MG/ML IJ SOLN
INTRAMUSCULAR | Status: DC | PRN
  Administered 2020-08-24: 5 mg via INTRAVENOUS

## 2020-08-24 MED ORDER — HEPARIN SODIUM (PORCINE) 1000 UNIT/ML IJ SOLN
INTRAMUSCULAR | Status: DC | PRN
  Administered 2020-08-24: 1000 [IU] via INTRAVENOUS

## 2020-08-24 MED ORDER — LIDOCAINE 2% (20 MG/ML) 5 ML SYRINGE
INTRAMUSCULAR | Status: DC | PRN
  Administered 2020-08-24: 50 mg via INTRAVENOUS

## 2020-08-24 MED ORDER — FENTANYL CITRATE (PF) 250 MCG/5ML IJ SOLN
INTRAMUSCULAR | Status: DC | PRN
Start: 2020-08-24 — End: 2020-08-24
  Administered 2020-08-24 (×2): 50 ug via INTRAVENOUS

## 2020-08-24 MED ORDER — HEPARIN SODIUM (PORCINE) 1000 UNIT/ML IJ SOLN
INTRAMUSCULAR | Status: DC | PRN
  Administered 2020-08-24: 15000 [IU] via INTRAVENOUS
  Administered 2020-08-24: 6000 [IU] via INTRAVENOUS
  Administered 2020-08-24: 1000 [IU] via INTRAVENOUS

## 2020-08-24 MED ORDER — SUGAMMADEX SODIUM 200 MG/2ML IV SOLN
INTRAVENOUS | Status: DC | PRN
  Administered 2020-08-24: 200 mg via INTRAVENOUS

## 2020-08-24 MED ORDER — HEPARIN (PORCINE) IN NACL 1000-0.9 UT/500ML-% IV SOLN
INTRAVENOUS | Status: DC | PRN
  Administered 2020-08-24 (×4): 500 mL

## 2020-08-24 MED ORDER — SODIUM CHLORIDE 0.9 % IV SOLN: 250.0000 mL | INTRAVENOUS | Status: DC | PRN

## 2020-08-24 MED ORDER — SODIUM CHLORIDE 0.9 % IV SOLN: INTRAVENOUS | Status: DC

## 2020-08-24 MED ORDER — DOBUTAMINE IN D5W 4-5 MG/ML-% IV SOLN
INTRAVENOUS | Status: AC
  Filled 2020-08-24: qty 250

## 2020-08-24 MED ORDER — DOBUTAMINE IN D5W 4-5 MG/ML-% IV SOLN
INTRAVENOUS | Status: DC | PRN
  Administered 2020-08-24: 20 ug/kg/min via INTRAVENOUS

## 2020-08-24 MED ORDER — ACETAMINOPHEN 325 MG PO TABS: 650.0000 mg | ORAL_TABLET | ORAL | Status: DC | PRN

## 2020-08-24 MED ORDER — SODIUM CHLORIDE 0.9% FLUSH: 3.0000 mL | Freq: Two times a day (BID) | INTRAVENOUS | Status: DC

## 2020-08-24 MED ORDER — PROTAMINE SULFATE 10 MG/ML IV SOLN
INTRAVENOUS | Status: DC | PRN
  Administered 2020-08-24: 40 mg via INTRAVENOUS

## 2020-08-24 MED ORDER — HEPARIN SODIUM (PORCINE) 1000 UNIT/ML IJ SOLN
INTRAMUSCULAR | Status: AC
  Filled 2020-08-24: qty 1

## 2020-08-24 MED ORDER — ROCURONIUM BROMIDE 10 MG/ML (PF) SYRINGE
PREFILLED_SYRINGE | INTRAVENOUS | Status: DC | PRN
  Administered 2020-08-24: 60 mg via INTRAVENOUS

## 2020-08-24 MED ORDER — HEPARIN (PORCINE) IN NACL 1000-0.9 UT/500ML-% IV SOLN
INTRAVENOUS | Status: DC | PRN
  Administered 2020-08-24: 500 mL

## 2020-08-24 MED ORDER — MIDAZOLAM HCL 2 MG/2ML IJ SOLN
INTRAMUSCULAR | Status: DC | PRN
  Administered 2020-08-24 (×2): 1 mg via INTRAVENOUS

## 2020-08-24 MED ORDER — SODIUM CHLORIDE 0.9% FLUSH: 3.0000 mL | INTRAVENOUS | Status: DC | PRN

## 2020-08-24 MED ORDER — ONDANSETRON HCL 4 MG/2ML IJ SOLN: 4.0000 mg | Freq: Four times a day (QID) | INTRAMUSCULAR | Status: DC | PRN

## 2020-08-24 MED ORDER — ONDANSETRON HCL 4 MG/2ML IJ SOLN
INTRAMUSCULAR | Status: DC | PRN
  Administered 2020-08-24: 4 mg via INTRAVENOUS

## 2020-08-24 MED ORDER — PROPOFOL 10 MG/ML IV BOLUS
INTRAVENOUS | Status: DC | PRN
  Administered 2020-08-24: 170 mg via INTRAVENOUS

## 2020-08-24 SURGICAL SUPPLY — 22 items
BLANKET WARM UNDERBOD FULL ACC (MISCELLANEOUS) ×3 IMPLANT
CATH MAPPNG PENTARAY F 2-6-2MM (CATHETERS) IMPLANT
CATH S CIRCA THERM PROBE 10F (CATHETERS) ×2 IMPLANT
CATH SMTCH THERMOCOOL SF DF (CATHETERS) ×2 IMPLANT
CATH SOUNDSTAR ECO 8FR (CATHETERS) ×2 IMPLANT
CATH WEBSTER BI DIR CS D-F CRV (CATHETERS) ×2 IMPLANT
CLOSURE PERCLOSE PROSTYLE (VASCULAR PRODUCTS) ×8 IMPLANT
COVER SWIFTLINK CONNECTOR (BAG) ×3 IMPLANT
KIT VERSACROSS STEERABLE D1 (CATHETERS) ×2 IMPLANT
MAT PREVALON FULL STRYKER (MISCELLANEOUS) ×2 IMPLANT
PACK EP LATEX FREE (CUSTOM PROCEDURE TRAY) ×3
PACK EP LF (CUSTOM PROCEDURE TRAY) ×1 IMPLANT
PAD PRO RADIOLUCENT 2001M-C (PAD) ×3 IMPLANT
PATCH CARTO3 (PAD) ×2 IMPLANT
PENTARAY F 2-6-2MM (CATHETERS) ×3
SHEATH CARTO VIZIGO SM CVD (SHEATH) ×2 IMPLANT
SHEATH PINNACLE 7F 10CM (SHEATH) ×2 IMPLANT
SHEATH PINNACLE 8F 10CM (SHEATH) ×4 IMPLANT
SHEATH PINNACLE 9F 10CM (SHEATH) ×2 IMPLANT
SHEATH PROBE COVER 6X72 (BAG) ×2 IMPLANT
SHIELD RADPAD SCOOP 12X17 (MISCELLANEOUS) ×2 IMPLANT
TUBING SMART ABLATE COOLFLOW (TUBING) ×2 IMPLANT

## 2020-08-24 NOTE — Anesthesia Procedure Notes (Signed)
Procedure Name: Intubation Date/Time: 08/24/2020 7:54 AM Performed by: Valda Favia, CRNA Pre-anesthesia Checklist: Patient identified, Emergency Drugs available, Suction available, Patient being monitored and Timeout performed Patient Re-evaluated:Patient Re-evaluated prior to induction Oxygen Delivery Method: Circle system utilized Preoxygenation: Pre-oxygenation with 100% oxygen Induction Type: IV induction Ventilation: Mask ventilation without difficulty and Oral airway inserted - appropriate to patient size Laryngoscope Size: Mac and 4 Grade View: Grade III Tube type: Oral Tube size: 7.5 mm Number of attempts: 1 Airway Equipment and Method: Stylet Placement Confirmation: ETT inserted through vocal cords under direct vision,  positive ETCO2 and breath sounds checked- equal and bilateral Secured at: 21 cm Tube secured with: Tape Dental Injury: Teeth and Oropharynx as per pre-operative assessment

## 2020-08-24 NOTE — Interval H&P Note (Signed)
History and Physical Interval Note:  Lucas [name]. has presented today for surgery, with the diagnosis of atrial fibrillation.  The various methods of treatment have been discussed with the patient and family. After consideration of risks, benefits and other options for treatment, the patient has consented to  Procedure(s): Catheter ablation as a surgical intervention .  Risks include but not limited to bleeding, vascular damage, tamponade, heart block, stroke, damage to surrounding organs, among others. The patient's history has been reviewed, patient examined, no change in status, stable for surgery.  I have reviewed the patient's chart and labs.  Questions were answered to the patient's satisfaction.    Hani Campusano Curt Bears, MD 08/24/2020 7:05 AM

## 2020-08-24 NOTE — Anesthesia Preprocedure Evaluation (Addendum)
Anesthesia Evaluation  Patient identified by MRN, date of birth, ID band Patient awake    Reviewed: Allergy & Precautions, NPO status , Patient's Chart, lab work & pertinent test results  Airway Mallampati: II  TM Distance: >3 FB Neck ROM: Full    Dental  (+) Teeth Intact, Dental Advisory Given   Pulmonary sleep apnea ,    breath sounds clear to auscultation       Cardiovascular hypertension, Pt. on home beta blockers and Pt. on medications + CAD  + dysrhythmias Atrial Fibrillation  Rhythm:Regular Rate:Normal     Neuro/Psych CVA    GI/Hepatic Neg liver ROS, GERD  Medicated,  Endo/Other  diabetes, Type 2, Oral Hypoglycemic Agents  Renal/GU      Musculoskeletal  (+) Arthritis ,   Abdominal (+) + obese,   Peds  Hematology negative hematology ROS (+)   Anesthesia Other Findings   Reproductive/Obstetrics                          Anesthesia Physical Anesthesia Plan  ASA: III  Anesthesia Plan: General   Post-op Pain Management:    Induction:   PONV Risk Score and Plan: 2 and Ondansetron and Dexamethasone  Airway Management Planned: Oral ETT  Additional Equipment: None  Intra-op Plan:   Post-operative Plan: Extubation in OR  Informed Consent:   Plan Discussed with: CRNA  Anesthesia Plan Comments:         Anesthesia Quick Evaluation

## 2020-08-24 NOTE — Discharge Instructions (Signed)
Post procedure care instructions No driving for 4 days. No lifting over 5 lbs for 1 week. No vigorous or sexual activity for 1 week. You may return to work/your usual activities on 08/31/2020. Keep procedure site clean & dry. If you notice increased pain, swelling, bleeding or pus, call/return!  You may shower, but no soaking baths/hot tubs/pools for 1 week.    You have an appointment set up with the [facility].  Multiple studies have shown that being followed by a dedicated atrial fibrillation clinic in addition to the standard care you receive from your other physicians improves health. We believe that enrollment in the atrial fibrillation clinic will allow [place] to better care for you.   The phone number to the [facility] is [phone]. The clinic is staffed Monday through Friday from 8:30am to 5pm.  Parking Directions: The clinic is located in the Heart and Vascular Building connected to [facility]. 1)From [address] turn on to [address] and go to the 3rd entrance  (Heart and Vascular entrance) on the right. 2)Look to the right for Heart &Vascular Parking Garage. 3)A code for the entrance is required, for November is 3008.   4)Take the elevators to the 1st floor. Registration is in the room with the glass walls at the end of the hallway.  If you have any trouble parking or locating the clinic, please don't hesitate to call [phone].  Cardiac Ablation, Care After  This sheet gives you information about how to care for yourself after your procedure. Your health care provider may also give you more specific instructions. If you have problems or questions, contact your health care provider. What can I expect after the procedure? After the procedure, it is common to have:  Bruising around your puncture site.  Tenderness around your puncture site.  Skipped heartbeats.  Tiredness (fatigue).  Follow these instructions at home: Puncture  site care   Follow instructions from your health care provider about how to take care of your puncture site. Make sure you: ? If present, leave stitches (sutures), skin glue, or adhesive strips in place. These skin closures may need to stay in place for up to 2 weeks. If adhesive strip edges start to loosen and curl up, you may trim the loose edges. Do not remove adhesive strips completely unless your health care provider tells you to do that. ? If a large square bandage is present, this may be removed in 24 hours.   Check your puncture site every day for signs of infection. Check for: ? Redness, swelling, or pain. ? Fluid or blood. If your puncture site starts to bleed, lie down on your back, apply firm pressure to the area, and contact your health care provider. ? Warmth. ? Pus or a bad smell. Driving  Do not drive for at least 4 days after your procedure or however long your health care provider recommends. (Do not resume driving if you have previously been instructed not to drive for other health reasons.)  Do not drive or use heavy machinery while taking prescription pain medicine. Activity  Avoid activities that take a lot of effort for at least 7 days after your procedure.  Do not lift anything that is heavier than 5 lb (4.5 kg) for one week.   No sexual activity for 1 week.   Return to your normal activities as told by your health care provider. Ask your health care provider what activities are safe for you. General instructions  Take over-the-counter and prescription medicines only as told by your health care provider.  Do not use any products that contain nicotine or tobacco, such as cigarettes and e-cigarettes. If you need help quitting, ask your health care provider.  You may shower after 24 hours, but Do not take baths, swim, or use a hot tub for 1 week.   Do not drink alcohol for 24 hours after your procedure.  Keep all follow-up visits as told by your health care  provider. This is important. Contact a health care provider if:  You have redness, mild swelling, or pain around your puncture site.  You have fluid or blood coming from your puncture site that stops after applying firm pressure to the area.  Your puncture site feels warm to the touch.  You have pus or a bad smell coming from your puncture site.  You have a fever.  You have chest pain or discomfort that spreads to your neck, jaw, or arm.  You are sweating a lot.  You feel nauseous.  You have a fast or irregular heartbeat.  You have shortness of breath.  You are dizzy or light-headed and feel the need to lie down.  You have pain or numbness in the arm or leg closest to your puncture site. Get help right away if:  Your puncture site suddenly swells.  Your puncture site is bleeding and the bleeding does not stop after applying firm pressure to the area. These symptoms may represent a serious problem that is an emergency. Do not wait to see if the symptoms will go away. Get medical help right away. Call your local emergency services (911 in the [place] Do not drive yourself to the hospital. Summary  After the procedure, it is normal to have bruising and tenderness at the puncture site in your groin, neck, or forearm.  Check your puncture site every day for signs of infection.  Get help right away if your puncture site is bleeding and the bleeding does not stop after applying firm pressure to the area. This is a medical emergency. This information is not intended to replace advice given to you by your health care provider. Make sure you discuss any questions you have with your health care provider.

## 2020-08-24 NOTE — Transfer of Care (Signed)
Immediate Anesthesia Transfer of Care Note  Patient: Lucas [name].  Procedure(s) Performed: ATRIAL FIBRILLATION ABLATION ([name] )  Patient Location: Cath Lab  Anesthesia Type:General  Level of Consciousness: drowsy  Airway & Oxygen Therapy: Patient Spontanous Breathing and Patient connected to nasal cannula oxygen  Post-op Assessment: Report given to RN and Post -op Vital signs reviewed and stable  Post vital signs: Reviewed and stable  Last Vitals:  Vitals Value Taken Time  BP 127/60 08/24/20 1048  Temp 36.2 C 08/24/20 1047  Pulse 73 08/24/20 1050  Resp 12 08/24/20 1050  SpO2 94 % 08/24/20 1050  Vitals shown include unvalidated device data.  Last Pain:  Vitals:   08/24/20 1047  TempSrc: Temporal  PainSc: Asleep      Patients Stated Pain Goal: 3 (58/30/94 0768)  Complications: No complications documented.

## 2020-08-24 NOTE — Anesthesia Postprocedure Evaluation (Signed)
Anesthesia Post Note  Patient: Lucas [name].  Procedure(s) Performed: ATRIAL FIBRILLATION ABLATION ([name] )     Patient location during evaluation: PACU Anesthesia Type: General Level of consciousness: awake and alert Pain management: pain level controlled Vital Signs Assessment: post-procedure vital signs reviewed and stable Respiratory status: spontaneous breathing, nonlabored ventilation, respiratory function stable and patient connected to nasal cannula oxygen Cardiovascular status: blood pressure returned to baseline and stable Postop Assessment: no apparent nausea or vomiting Anesthetic complications: no   No complications documented.  Last Vitals:  Vitals:   08/24/20 1400 08/24/20 1430  BP: 135/62 131/62  Pulse: 73 79  Resp: 19 (!) 22  Temp:    SpO2: 92% 93%    Last Pain:  Vitals:   08/24/20 1140  TempSrc:   PainSc: 0-No pain                 Effie Berkshire

## 2020-08-26 ENCOUNTER — Encounter (HOSPITAL_COMMUNITY): Payer: Self-pay | Admitting: Cardiovascular Disease

## 2020-08-27 ENCOUNTER — Encounter (HOSPITAL_COMMUNITY): Payer: Self-pay | Admitting: Cardiology

## 2020-08-27 ENCOUNTER — Other Ambulatory Visit: Payer: Self-pay

## 2020-08-27 ENCOUNTER — Telehealth: Payer: Self-pay | Admitting: Cardiology

## 2020-08-27 ENCOUNTER — Telehealth: Payer: Self-pay

## 2020-08-27 ENCOUNTER — Ambulatory Visit (HOSPITAL_COMMUNITY)
Admission: RE | Admit: 2020-08-27 | Discharge: 2020-08-27 | Disposition: A | Discharge status: Home / Self Care | Payer: 59 | Source: Ambulatory Visit | Attending: Physician Assistant | Admitting: Physician Assistant

## 2020-08-27 VITALS — BP 134/76 | HR 80 | Ht 70.0 in | Wt 261.0 lb

## 2020-08-27 DIAGNOSIS — Z885 Allergy status to narcotic agent status: Secondary | ICD-10-CM | POA: Insufficient documentation

## 2020-08-27 DIAGNOSIS — E119 Type 2 diabetes mellitus without complications: Secondary | ICD-10-CM | POA: Insufficient documentation

## 2020-08-27 DIAGNOSIS — Z7984 Long term (current) use of oral hypoglycemic drugs: Secondary | ICD-10-CM | POA: Insufficient documentation

## 2020-08-27 DIAGNOSIS — I4892 Unspecified atrial flutter: Secondary | ICD-10-CM | POA: Insufficient documentation

## 2020-08-27 DIAGNOSIS — G4733 Obstructive sleep apnea (adult) (pediatric): Secondary | ICD-10-CM | POA: Diagnosis not present

## 2020-08-27 DIAGNOSIS — I2510 Atherosclerotic heart disease of native coronary artery without angina pectoris: Secondary | ICD-10-CM | POA: Insufficient documentation

## 2020-08-27 DIAGNOSIS — Z8249 Family history of ischemic heart disease and other diseases of the circulatory system: Secondary | ICD-10-CM | POA: Diagnosis not present

## 2020-08-27 DIAGNOSIS — I480 Paroxysmal atrial fibrillation: Secondary | ICD-10-CM | POA: Diagnosis present

## 2020-08-27 DIAGNOSIS — Z7901 Long term (current) use of anticoagulants: Secondary | ICD-10-CM | POA: Diagnosis not present

## 2020-08-27 DIAGNOSIS — I10 Essential (primary) hypertension: Secondary | ICD-10-CM | POA: Diagnosis not present

## 2020-08-27 DIAGNOSIS — Z79899 Other long term (current) drug therapy: Secondary | ICD-10-CM | POA: Diagnosis not present

## 2020-08-27 DIAGNOSIS — D6869 Other thrombophilia: Secondary | ICD-10-CM

## 2020-08-27 MED ORDER — MULTAQ 400 MG PO TABS
400.0000 mg | ORAL_TABLET | Freq: Two times a day (BID) | ORAL | 3 refills | Status: DC
Start: 2020-08-27 — End: 2020-09-21

## 2020-08-27 NOTE — Telephone Encounter (Signed)
Was in chart because Santiago Glad with [employer] was calling to make sure ablation appointment last week was not canceled and ablation was completed.

## 2020-08-27 NOTE — Telephone Encounter (Signed)
Patient c/o Palpitations:  High priority if patient c/o lightheadedness, shortness of breath, or chest pain  1) How long have you had palpitations/irregular HR/ Afib? Are you having the symptoms now? Since 9 pm last night, yes  2) Are you currently experiencing lightheadedness, SOB or CP? Headache, Sunday had chest pain  3) Do you have a history of afib (atrial fibrillation) or irregular heart rhythm? yes  4) Have you checked your BP or HR? (document readings if available): 136 5 minutes ago, went up to 148  5) Are you experiencing any other symptoms? headache  Patient states he had his ablation Friday and has been in afib since last night. He states he also had chest pain Sunday. He states his HR went up to 148 and was 136 about 5 minutes ago.

## 2020-08-27 NOTE — Patient Instructions (Signed)
Start Multaq 400mg twice a day WITH FOOD 

## 2020-08-27 NOTE — H&P (View-Only) (Signed)
Primary Care Physician: Glenda Chroman, MD Referring Physician: Dr. Harl Bowie  Primary EP: Dr Daphene Calamity. is a 64 y.o. male with a h/o CAD, HTN, DM,  typical atrial  flutter ablation in 2017, paroxysmal afib that has had 2 episodes of afib with RVR with  chest pain that occurs with afib, since last week. [name] He was treated in the ER 6/1 with chest pain and was in and out of afib so no changes were made. He is on metoprolol 100 mg bid and xarelto for a CHA2DS2VASc score of 4. He is now here to discuss increase in afib episodes. He also states that he has noted an increase of shortness of breath and intermittent chest tightness since February, when he returned to work, after carpel wrist surgery. He has to wear a mask at work and was thinking it was from  mask wear. However, he has to walk up a small incline on his way to work and has to slow down for the shortness of breath and mild chest discomfort, and does not have the mask on at that point. He does have untreated sleep  apnea. No alcohol, excessive caffeine or tobacco use. He is pending an appointment with Dr. Curt Bears in July. Last echo in 2017 and last cath in 2019.  F/u in afib clinic, 8/8. PT had a [facility] admission since I last saw him for afib with RVR and chest apain. HE wa on his way to work when he felt it and decided to stop by the ER. Troponin negative. Had echo with LVH with EF at 690-65%Today, I discussed Coumadin as well as novel anticoagulants including Pradaxa, Xarelto, Savaysa, and Eliquis today as indicated for risk reduction in stroke and systemic emboli with nonvalvular atrial fibrillation.  Risks, benefits, and alternatives to each of these drugs were discussed at length today. No wall motion abnormalities. IT was not flet that pt needed a stress test/cath. Cardizem 60 mg bid was added to his Toprol and no further afib. We discussed trying Multaq but he would prefer not to add any more meds and discuss with Dr. Curt Bears  front   line ablation 7/29. He also has an appointment with Dr. Claiborne Billings to discuss restarting his cpap as he has not used for a few years. I told him his success from ablation or antiarrythmic's  would be lowered if sleep apnea was not treated.   Follow up in the AF clinic 08/27/20. Patient is s/p afib ablation with Dr Curt Bears on 08/24/20. He reports that he noted a rapid heart beat on 08/26/20. He has a smart watch which has shown afib and what appears to be SVT vs atrial flutter. He was in SR on presentation today but became tachycardic during the visit.   Today, he denies symptoms of chest pain, shortness of breath, orthopnea, PND, lower extremity edema, dizziness, presyncope, syncope, or neurologic sequela. The patient is tolerating medications without difficulties and is otherwise without complaint today.   Past Medical History:  Diagnosis Date  . Arthritis   . Bilateral carpal tunnel syndrome 08/16/2019  . CAD (coronary artery disease)    a. LHC on 01/29/16 which revealed significant apical LAD stenosis, best treated medically. There was moderate disease in the D2 and mid nondominant RCA.[name] No PCI performed b. 03/2018: repeat cath showing regression of his LAD stenosis now being at 50% with 60% mid RCA stenosis, 45% ostial LPDA stenosis, and 45% ostial second diagonal stenosis.  [name]  Cataract   . Diabetes mellitus    type 2  . GERD (gastroesophageal reflux disease)   . Heart disease   . Hyperlipidemia   . Hypertension   . Obesity   . PAF (paroxysmal atrial fibrillation) ([place]) 01/2016  . Sleep apnea    has CPAP machine but not wear  . Tubular adenoma of colon 08/2012  . 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 ANKLE SURGERY Left 2012   tendon repair  . ATRIAL FIBRILLATION ABLATION [name] 08/24/2020   Procedure: ATRIAL FIBRILLATION ABLATION;  Surgeon: Constance Haw, MD;  Location: [facility];  Service: Cardiovascular;   Laterality: [name];  . CARDIAC CATHETERIZATION [name] 01/29/2016   Procedure: Left Heart Cath and Coronary Angiography;  Surgeon: [name], MD;  Location: [facility];  Service: Cardiovascular;  Laterality: [name];  . CARPAL TUNNEL RELEASE Right 09/06/2019   Procedure: CARPAL TUNNEL RELEASE;  Surgeon: Daryll Brod, MD;  Location: [facility];  Service: Orthopedics;  Laterality: Right;  . CARPAL TUNNEL RELEASE Left 10/11/2019   Procedure: LEFT CARPAL TUNNEL RELEASE;  Surgeon: Daryll Brod, MD;  Location: [facility];  Service: Orthopedics;  Laterality: Left;  IV REGIONAL FOREARM BLOCK  . CATARACT EXTRACTION W/PHACO  11/11/2012   Procedure: CATARACT EXTRACTION PHACO AND INTRAOCULAR LENS PLACEMENT (IOC);  Surgeon: [name], MD;  Location: [facility];  Service: Ophthalmology;  Laterality: Right;  CDE:  5.56  . CATARACT EXTRACTION W/PHACO Left 12/15/2013   Procedure: CATARACT EXTRACTION PHACO AND INTRAOCULAR LENS PLACEMENT (IOC);  Surgeon: [name], MD;  Location: [facility];  Service: Ophthalmology;  Laterality: Left;  CDE:13.36  . CHOLECYSTECTOMY    . CHONDROPLASTY Right 05/28/2017   Procedure: RIGHT KNEE CHONDROPLASTY;  Surgeon: [name], MD;  Location: [facility];  Service: Orthopedics;  Laterality: Right;  . COLONOSCOPY    . CYSTOSCOPY W/ URETERAL STENT PLACEMENT     right  . ELECTROPHYSIOLOGIC STUDY [name] 06/02/2016   Procedure: A-Flutter Ablation;  Surgeon: [name], MD;  Location: [facility];  Service: Cardiovascular;  Laterality: [name];  . KNEE ARTHROSCOPY WITH MEDIAL MENISECTOMY Right 05/28/2017   Procedure: KNEE ARTHROSCOPY WITH MEDIAL MENISECTOMY WITH EXTENSIVE SYNOVECTOMY;  Surgeon: [name], MD;  Location: [facility];  Service: Orthopedics;  Laterality: Right;  . LEFT HEART CATH AND CORONARY ANGIOGRAPHY [name] 03/30/2018   Procedure: LEFT HEART CATH AND CORONARY ANGIOGRAPHY;  Surgeon: Leonie Man, MD;   Location: [facility];  Service: Cardiovascular;  Laterality: [name];  . LEFT HEART CATH AND CORONARY ANGIOGRAPHY [name] 07/27/2020   Procedure: LEFT HEART CATH AND CORONARY ANGIOGRAPHY;  Surgeon: Sherren Mocha, MD;  Location: [facility];  Service: Cardiovascular;  Laterality: [name];  . left knee sugery Left    Arthroscopy  . NASAL SINUS SURGERY    . POLYPECTOMY    . TEE WITHOUT CARDIOVERSION [name] 08/23/2020   Procedure: TRANSESOPHAGEAL ECHOCARDIOGRAM (TEE);  Surgeon: Acie Fredrickson Wonda Cheng, MD;  [facility];  Service: Cardiovascular;  Laterality: [name];  . TONSILLECTOMY      Current Outpatient Medications  Medication Sig Dispense Refill  . acetaminophen (TYLENOL) 325 MG tablet Take 650 mg by mouth every 6 (six) hours as needed for moderate pain or headache.     [name] atorvastatin (LIPITOR) 80 MG tablet TAKE 1 TABLET (80 MG TOTAL) BY MOUTH DAILY AT 6 PM. 90  tablet 3  . diltiazem (CARDIZEM CD) 240 MG 24 hr capsule Take 1 capsule (240 mg total) by mouth daily. (Patient taking differently: Take 240 mg by mouth every evening. ) 90 capsule 0  . furosemide (LASIX) 20 MG tablet Take 20 mg by mouth daily as needed for fluid.     [name] glimepiride (AMARYL) 4 MG tablet Take 4 mg by mouth 2 (two) times daily.     [name] HYDROcodone-acetaminophen (NORCO) 5-325 MG tablet Take 1 tablet by mouth every 6 (six) hours as needed. 20 tablet 0  . isosorbide mononitrate (IMDUR) 120 MG 24 hr tablet Take 1 tablet (120 mg total) by mouth daily. 90 tablet 3  . metoprolol succinate (TOPROL-XL) 100 MG 24 hr tablet TAKE 1 AND 1/2 TABLETS BY MOUTH DAILY WITH OR IMMEDIATELY FOLLOWING A MEAL (Patient taking differently: Take 100 mg by mouth in the morning and at bedtime. ) 135 tablet 3  . nitroGLYCERIN (NITROSTAT) 0.4 MG SL tablet Place 1 tablet (0.4 mg total) under the tongue every 5 (five) minutes as needed for chest pain. (Patient taking differently: Place 0.4 mg under the tongue every 5 (five) minutes x 3 doses as needed for  chest pain. ) 25 tablet 3  . OZEMPIC, 0.25 OR 0.5 MG/DOSE, 2 MG/1.5ML SOPN Inject 0.5 mg into the skin every Saturday.     . pantoprazole (PROTONIX) 40 MG tablet Take 40 mg by mouth daily before breakfast.     . SYNJARDY XR 12.03-999 MG TB24 Take 1 tablet by mouth in the morning and at bedtime.    [name] 20 MG TABS tablet TAKE 1 TABLET (20 MG TOTAL) BY MOUTH DAILY WITH SUPPER. (Patient taking differently: Take 20 mg by mouth daily with supper. ) 90 tablet 3   No current facility-administered medications for this visit.    Allergies  Allergen Reactions  . Bee Venom Swelling  . Codeine Nausea Only    Social History   Socioeconomic History  . Marital status: Married    Spouse name: Not on file  . Number of children: Not on file  . Years of education: 5  . Highest education level: Not on file  Occupational History  . Not on file  Tobacco Use  . Smoking status: Never Smoker  . Smokeless tobacco: Never Used  Vaping Use  . Vaping Use: Never used  Substance and Sexual Activity  . Alcohol use: Yes    Alcohol/week: 0.0 standard drinks    Comment: rare occasion  . Drug use: No  . Sexual activity: Yes    Birth control/protection: None  Other Topics Concern  . Not on file  Social History Narrative   Right handed   Caffeine~ 1-2 cups per day   Lives at home with wife Shirlean Mylar    Social Determinants of Health   Financial Resource Strain:   . Difficulty of Paying Living Expenses: Not on file  Food Insecurity:   . Worried About Charity fundraiser in the Last Year: Not on file  . Ran Out of Food in the Last Year: Not on file  Transportation Needs:   . Lack of Transportation (Medical): Not on file  . Lack of Transportation (Non-Medical): Not on file  Physical Activity:   . Days of Exercise per Week: Not on file  . Minutes of Exercise per Session: Not on file  Stress:   . Feeling of Stress : Not on file  Social Connections:   . Frequency of Communication with Friends  and  Family: Not on file  . Frequency of Social Gatherings with Friends and Family: Not on file  . Attends Religious Services: Not on file  . Active Member of Clubs or Organizations: Not on file  . Attends Archivist Meetings: Not on file  . Marital Status: Not on file  Intimate Partner Violence:   . Fear of Current or Ex-Partner: Not on file  . Emotionally Abused: Not on file  . Physically Abused: Not on file  . Sexually Abused: Not on file    Family History  Problem Relation Age of Onset  . Heart murmur Mother   . Aneurysm Mother   . Hypertension Mother   . Cirrhosis Father   . Heart attack Sister   . Diabetes Sister   . Aneurysm Maternal Grandmother   . Stroke Maternal Grandmother   . Heart attack Paternal Grandmother   . Stroke Sister   . Colon cancer Neg Hx   . Rectal cancer Neg Hx   . Stomach cancer Neg Hx     ROS- All systems are reviewed and negative except as per the HPI above  Physical Exam: There were no vitals filed for this visit. Wt Readings from Last 3 Encounters:  08/24/20 116.6 kg  08/23/20 120.2 kg  07/27/20 120.2 kg    Labs: Lab Results  Component Value Date   NA 139 08/22/2020   K 3.9 08/22/2020   CL 105 08/22/2020   CO2 26 08/22/2020   GLUCOSE 106 (H) 08/22/2020   BUN 20 08/22/2020   CREATININE 0.85 08/22/2020   CALCIUM 9.0 08/22/2020   MG 1.7 07/04/2020   Lab Results  Component Value Date   INR 1.17 01/29/2016   Lab Results  Component Value Date   CHOL 195 01/29/2016   HDL 39 (L) 01/29/2016   LDLCALC 129 (H) 01/29/2016   TRIG 133 01/29/2016   GEN- The patient is well appearing obese male, alert and oriented x 3 today.   HEENT-head normocephalic, atraumatic, sclera clear, conjunctiva pink, hearing intact, trachea midline. Lungs- Clear to ausculation bilaterally, normal work of breathing Heart- Regular rate and rhythm, tachycardia, no murmurs, rubs or gallops  GI- soft, NT, ND, + BS Extremities- no clubbing, cyanosis, or  edema MS- no significant deformity or atrophy Skin- no rash or lesion Psych- euthymic mood, full affect Neuro- strength and sensation are intact   EKG- initially SR HR 80. SVT vs 2:1 atrial flutter HR 145, QRS 86, QTc 490  Echo- 05/09/20- FINDINGS  Left Ventricle: Left ventricular ejection fraction, by estimation, is 60  to 65%. The left ventricle has normal function. The left ventricle has no  regional wall motion abnormalities. Definity contrast agent was given IV  to delineate the left ventricular  endocardial borders. The left ventricular internal cavity size was normal  in size. There is moderate left ventricular hypertrophy. Left ventricular  diastolic parameters are indeterminate.   Assessment and Plan: 1. Paroxsymal afib/atrial flutter S/p afib ablation with Dr Curt Bears 08/24/20.  Patient having paroxysms of afib and flutter.  Reassured patient that some episodes of afib are common post ablation for 3 months. Will plan to start Multaq 400 mg BID. Anticipate this will be short term. Continue Xarelto 20 mg daily Continue Toprol 150 mg daily Continue diltiazem 240 mg daily  2. CAD Diffuse nonobstructive disease on [facility] 07/27/20. No anginal symptoms.  3. CHA2DS2VASc score of 4  Continue xarelto 20 mg daily   4. OSA Patient reports compliance with CPAP  therapy. Followed by Dr Claiborne Billings.   Follow up in the AF clinic later this week for ECG.    [facility] [address] [place], [place] [address] [phone]

## 2020-08-27 NOTE — Progress Notes (Signed)
Primary Care Physician: Glenda Chroman, MD Referring Physician: Dr. Harl Bowie  Primary EP: Dr Daphene Calamity. is a 64 y.o. male with a h/o CAD, HTN, DM,  typical atrial  flutter ablation in 2017, paroxysmal afib that has had 2 episodes of afib with RVR with  chest pain that occurs with afib, since last week. [name] He was treated in the ER 6/1 with chest pain and was in and out of afib so no changes were made. He is on metoprolol 100 mg bid and xarelto for a CHA2DS2VASc score of 4. He is now here to discuss increase in afib episodes. He also states that he has noted an increase of shortness of breath and intermittent chest tightness since February, when he returned to work, after carpel wrist surgery. He has to wear a mask at work and was thinking it was from  mask wear. However, he has to walk up a small incline on his way to work and has to slow down for the shortness of breath and mild chest discomfort, and does not have the mask on at that point. He does have untreated sleep  apnea. No alcohol, excessive caffeine or tobacco use. He is pending an appointment with Dr. Curt Bears in July. Last echo in 2017 and last cath in 2019.  F/u in afib clinic, 8/8. PT had a [facility] admission since I last saw him for afib with RVR and chest apain. HE wa on his way to work when he felt it and decided to stop by the ER. Troponin negative. Had echo with LVH with EF at 690-65%Today, I discussed Coumadin as well as novel anticoagulants including Pradaxa, Xarelto, Savaysa, and Eliquis today as indicated for risk reduction in stroke and systemic emboli with nonvalvular atrial fibrillation.  Risks, benefits, and alternatives to each of these drugs were discussed at length today. No wall motion abnormalities. IT was not flet that pt needed a stress test/cath. Cardizem 60 mg bid was added to his Toprol and no further afib. We discussed trying Multaq but he would prefer not to add any more meds and discuss with Dr. Curt Bears  front   line ablation 7/29. He also has an appointment with Dr. Claiborne Billings to discuss restarting his cpap as he has not used for a few years. I told him his success from ablation or antiarrythmic's  would be lowered if sleep apnea was not treated.   Follow up in the AF clinic 08/27/20. Patient is s/p afib ablation with Dr Curt Bears on 08/24/20. He reports that he noted a rapid heart beat on 08/26/20. He has a smart watch which has shown afib and what appears to be SVT vs atrial flutter. He was in SR on presentation today but became tachycardic during the visit.   Today, he denies symptoms of chest pain, shortness of breath, orthopnea, PND, lower extremity edema, dizziness, presyncope, syncope, or neurologic sequela. The patient is tolerating medications without difficulties and is otherwise without complaint today.   Past Medical History:  Diagnosis Date  . Arthritis   . Bilateral carpal tunnel syndrome 08/16/2019  . CAD (coronary artery disease)    a. LHC on 01/29/16 which revealed significant apical LAD stenosis, best treated medically. There was moderate disease in the D2 and mid nondominant RCA.[name] No PCI performed b. 03/2018: repeat cath showing regression of his LAD stenosis now being at 50% with 60% mid RCA stenosis, 45% ostial LPDA stenosis, and 45% ostial second diagonal stenosis.  [name]  Cataract   . Diabetes mellitus    type 2  . GERD (gastroesophageal reflux disease)   . Heart disease   . Hyperlipidemia   . Hypertension   . Obesity   . PAF (paroxysmal atrial fibrillation) ([place]) 01/2016  . Sleep apnea    has CPAP machine but not wear  . Tubular adenoma of colon 08/2012  . 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 ANKLE SURGERY Left 2012   tendon repair  . ATRIAL FIBRILLATION ABLATION [name] 08/24/2020   Procedure: ATRIAL FIBRILLATION ABLATION;  Surgeon: Constance Haw, MD;  Location: [place] [facility];  Service: Cardiovascular;   Laterality: [name];  . CARDIAC CATHETERIZATION [name] 01/29/2016   Procedure: Left Heart Cath and Coronary Angiography;  Surgeon: [name], MD;  Location: [place];  Service: Cardiovascular;  Laterality: [name];  . CARPAL TUNNEL RELEASE Right 09/06/2019   Procedure: CARPAL TUNNEL RELEASE;  Surgeon: Daryll Brod, MD;  Location: [facility];  Service: Orthopedics;  Laterality: Right;  . CARPAL TUNNEL RELEASE Left 10/11/2019   Procedure: LEFT CARPAL TUNNEL RELEASE;  Surgeon: Daryll Brod, MD;  Location: [facility];  Service: Orthopedics;  Laterality: Left;  IV REGIONAL FOREARM BLOCK  . CATARACT EXTRACTION W/PHACO  11/11/2012   Procedure: CATARACT EXTRACTION PHACO AND INTRAOCULAR LENS PLACEMENT (IOC);  Surgeon: [name], MD;  Location: [facility];  Service: Ophthalmology;  Laterality: Right;  CDE:  5.56  . CATARACT EXTRACTION W/PHACO Left 12/15/2013   Procedure: CATARACT EXTRACTION PHACO AND INTRAOCULAR LENS PLACEMENT (IOC);  Surgeon: [name], MD;  Location: [facility];  Service: Ophthalmology;  Laterality: Left;  CDE:13.36  . CHOLECYSTECTOMY    . CHONDROPLASTY Right 05/28/2017   Procedure: RIGHT KNEE CHONDROPLASTY;  Surgeon: [name], MD;  Location: [facility];  Service: Orthopedics;  Laterality: Right;  . COLONOSCOPY    . CYSTOSCOPY W/ URETERAL STENT PLACEMENT     right  . ELECTROPHYSIOLOGIC STUDY [name] 06/02/2016   Procedure: A-Flutter Ablation;  Surgeon: [name], MD;  Location: [facility];  Service: Cardiovascular;  Laterality: [name];  . KNEE ARTHROSCOPY WITH MEDIAL MENISECTOMY Right 05/28/2017   Procedure: KNEE ARTHROSCOPY WITH MEDIAL MENISECTOMY WITH EXTENSIVE SYNOVECTOMY;  Surgeon: [name], MD;  Location: [facility];  Service: Orthopedics;  Laterality: Right;  . LEFT HEART CATH AND CORONARY ANGIOGRAPHY [name] 03/30/2018   Procedure: LEFT HEART CATH AND CORONARY ANGIOGRAPHY;  Surgeon: Leonie Man, MD;   Location: [facility];  Service: Cardiovascular;  Laterality: [name];  . LEFT HEART CATH AND CORONARY ANGIOGRAPHY [name] 07/27/2020   Procedure: LEFT HEART CATH AND CORONARY ANGIOGRAPHY;  Surgeon: Sherren Mocha, MD;  Location: [facility];  Service: Cardiovascular;  Laterality: [name];  . left knee sugery Left    Arthroscopy  . NASAL SINUS SURGERY    . POLYPECTOMY    . TEE WITHOUT CARDIOVERSION [name] 08/23/2020   Procedure: TRANSESOPHAGEAL ECHOCARDIOGRAM (TEE);  Surgeon: Acie Fredrickson Wonda Cheng, MD;  Location: [facility];  Service: Cardiovascular;  Laterality: [name];  . TONSILLECTOMY      Current Outpatient Medications  Medication Sig Dispense Refill  . acetaminophen (TYLENOL) 325 MG tablet Take 650 mg by mouth every 6 (six) hours as needed for moderate pain or headache.     [name] atorvastatin (LIPITOR) 80 MG tablet TAKE 1 TABLET (80 MG TOTAL) BY MOUTH DAILY AT 6 PM. 90  tablet 3  . diltiazem (CARDIZEM CD) 240 MG 24 hr capsule Take 1 capsule (240 mg total) by mouth daily. (Patient taking differently: Take 240 mg by mouth every evening. ) 90 capsule 0  . furosemide (LASIX) 20 MG tablet Take 20 mg by mouth daily as needed for fluid.     [name] glimepiride (AMARYL) 4 MG tablet Take 4 mg by mouth 2 (two) times daily.     [name] HYDROcodone-acetaminophen (NORCO) 5-325 MG tablet Take 1 tablet by mouth every 6 (six) hours as needed. 20 tablet 0  . isosorbide mononitrate (IMDUR) 120 MG 24 hr tablet Take 1 tablet (120 mg total) by mouth daily. 90 tablet 3  . metoprolol succinate (TOPROL-XL) 100 MG 24 hr tablet TAKE 1 AND 1/2 TABLETS BY MOUTH DAILY WITH OR IMMEDIATELY FOLLOWING A MEAL (Patient taking differently: Take 100 mg by mouth in the morning and at bedtime. ) 135 tablet 3  . nitroGLYCERIN (NITROSTAT) 0.4 MG SL tablet Place 1 tablet (0.4 mg total) under the tongue every 5 (five) minutes as needed for chest pain. (Patient taking differently: Place 0.4 mg under the tongue every 5 (five) minutes x 3 doses as needed for  chest pain. ) 25 tablet 3  . OZEMPIC, 0.25 OR 0.5 MG/DOSE, 2 MG/1.5ML SOPN Inject 0.5 mg into the skin every Saturday.     . pantoprazole (PROTONIX) 40 MG tablet Take 40 mg by mouth daily before breakfast.     . SYNJARDY XR 12.03-999 MG TB24 Take 1 tablet by mouth in the morning and at bedtime.    [name] 20 MG TABS tablet TAKE 1 TABLET (20 MG TOTAL) BY MOUTH DAILY WITH SUPPER. (Patient taking differently: Take 20 mg by mouth daily with supper. ) 90 tablet 3   No current facility-administered medications for this visit.    Allergies  Allergen Reactions  . Bee Venom Swelling  . Codeine Nausea Only    Social History   Socioeconomic History  . Marital status: Married    Spouse name: Not on file  . Number of children: Not on file  . Years of education: 36  . Highest education level: Not on file  Occupational History  . Not on file  Tobacco Use  . Smoking status: Never Smoker  . Smokeless tobacco: Never Used  Vaping Use  . Vaping Use: Never used  Substance and Sexual Activity  . Alcohol use: Yes    Alcohol/week: 0.0 standard drinks    Comment: rare occasion  . Drug use: No  . Sexual activity: Yes    Birth control/protection: None  Other Topics Concern  . Not on file  Social History Narrative   Right handed   Caffeine~ 1-2 cups per day   Lives at home with wife Shirlean Mylar    Social Determinants of Health   Financial Resource Strain:   . Difficulty of Paying Living Expenses: Not on file  Food Insecurity:   . Worried About Charity fundraiser in the Last Year: Not on file  . Ran Out of Food in the Last Year: Not on file  Transportation Needs:   . Lack of Transportation (Medical): Not on file  . Lack of Transportation (Non-Medical): Not on file  Physical Activity:   . Days of Exercise per Week: Not on file  . Minutes of Exercise per Session: Not on file  Stress:   . Feeling of Stress : Not on file  Social Connections:   . Frequency of Communication with Friends  and  Family: Not on file  . Frequency of Social Gatherings with Friends and Family: Not on file  . Attends Religious Services: Not on file  . Active Member of Clubs or Organizations: Not on file  . Attends Archivist Meetings: Not on file  . Marital Status: Not on file  Intimate Partner Violence:   . Fear of Current or Ex-Partner: Not on file  . Emotionally Abused: Not on file  . Physically Abused: Not on file  . Sexually Abused: Not on file    Family History  Problem Relation Age of Onset  . Heart murmur Mother   . Aneurysm Mother   . Hypertension Mother   . Cirrhosis Father   . Heart attack Sister   . Diabetes Sister   . Aneurysm Maternal Grandmother   . Stroke Maternal Grandmother   . Heart attack Paternal Grandmother   . Stroke Sister   . Colon cancer Neg Hx   . Rectal cancer Neg Hx   . Stomach cancer Neg Hx     ROS- All systems are reviewed and negative except as per the HPI above  Physical Exam: There were no vitals filed for this visit. Wt Readings from Last 3 Encounters:  08/24/20 116.6 kg  08/23/20 120.2 kg  07/27/20 120.2 kg    Labs: Lab Results  Component Value Date   NA 139 08/22/2020   K 3.9 08/22/2020   CL 105 08/22/2020   CO2 26 08/22/2020   GLUCOSE 106 (H) 08/22/2020   BUN 20 08/22/2020   CREATININE 0.85 08/22/2020   CALCIUM 9.0 08/22/2020   MG 1.7 07/04/2020   Lab Results  Component Value Date   INR 1.17 01/29/2016   Lab Results  Component Value Date   CHOL 195 01/29/2016   HDL 39 (L) 01/29/2016   LDLCALC 129 (H) 01/29/2016   TRIG 133 01/29/2016   GEN- The patient is well appearing obese male, alert and oriented x 3 today.   HEENT-head normocephalic, atraumatic, sclera clear, conjunctiva pink, hearing intact, trachea midline. Lungs- Clear to ausculation bilaterally, normal work of breathing Heart- Regular rate and rhythm, tachycardia, no murmurs, rubs or gallops  GI- soft, NT, ND, + BS Extremities- no clubbing, cyanosis, or  edema MS- no significant deformity or atrophy Skin- no rash or lesion Psych- euthymic mood, full affect Neuro- strength and sensation are intact   EKG- initially SR HR 80. SVT vs 2:1 atrial flutter HR 145, QRS 86, QTc 490  Echo- 05/09/20- FINDINGS  Left Ventricle: Left ventricular ejection fraction, by estimation, is 60  to 65%. The left ventricle has normal function. The left ventricle has no  regional wall motion abnormalities. Definity contrast agent was given IV  to delineate the left ventricular  endocardial borders. The left ventricular internal cavity size was normal  in size. There is moderate left ventricular hypertrophy. Left ventricular  diastolic parameters are indeterminate.   Assessment and Plan: 1. Paroxsymal afib/atrial flutter S/p afib ablation with Dr Curt Bears 08/24/20.  Patient having paroxysms of afib and flutter.  Reassured patient that some episodes of afib are common post ablation for 3 months. Will plan to start Multaq 400 mg BID. Anticipate this will be short term. Continue Xarelto 20 mg daily Continue Toprol 150 mg daily Continue diltiazem 240 mg daily  2. CAD Diffuse nonobstructive disease on [facility] 07/27/20. No anginal symptoms.  3. CHA2DS2VASc score of 4  Continue xarelto 20 mg daily   4. OSA Patient reports compliance with CPAP  therapy. Followed by Dr Claiborne Billings.   Follow up in the AF clinic later this week for ECG.    [facility] [address] [place], [place] [address] [phone]

## 2020-08-27 NOTE — Telephone Encounter (Signed)
Patient calling complaining of elevated HR and chest soreness yesterday that has since resolved. Max HR has gotten to 148 bpm. Patient is unsure if he is back in Afib. He had an ablation on 10/15. Spoke with Dr. Curt Bears who would like him to be seen by Afib Clinic. Appt made for this afternoon 3 pm with Marcene Brawn, PA.

## 2020-08-28 ENCOUNTER — Ambulatory Visit (HOSPITAL_COMMUNITY)
Admission: RE | Admit: 2020-08-28 | Discharge: 2020-08-28 | Disposition: A | Discharge status: Home / Self Care | Payer: 59 | Source: Ambulatory Visit | Attending: Physician Assistant | Admitting: Physician Assistant

## 2020-08-28 ENCOUNTER — Telehealth: Payer: Self-pay | Admitting: Cardiology

## 2020-08-28 VITALS — BP 120/68 | HR 108 | Ht 70.0 in | Wt 262.6 lb

## 2020-08-28 DIAGNOSIS — I4891 Unspecified atrial fibrillation: Secondary | ICD-10-CM | POA: Insufficient documentation

## 2020-08-28 DIAGNOSIS — Z79899 Other long term (current) drug therapy: Secondary | ICD-10-CM | POA: Insufficient documentation

## 2020-08-28 DIAGNOSIS — D6869 Other thrombophilia: Secondary | ICD-10-CM

## 2020-08-28 DIAGNOSIS — I484 Atypical atrial flutter: Secondary | ICD-10-CM | POA: Insufficient documentation

## 2020-08-28 NOTE — Telephone Encounter (Signed)
New Message    Patient wants Dr Harl Bowie to write him out of work for another 3 weeks , his HR is back up to 150. 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

## 2020-08-28 NOTE — Patient Instructions (Signed)
Cardioversion scheduled for Friday, October 22nd  - Arrive at the [facility] and go to admitting at [facility] not eat or drink anything after midnight the night prior to your procedure.  - Take all your morning medication (except diabetic medications) with a sip of water prior to arrival.  - You will not be able to drive home after your procedure.  - Do NOT miss any doses of your blood thinner - if you should miss a dose please notify our office immediately.  - If you feel as if you go back into normal rhythm prior to scheduled cardioversion, please notify our office immediately. If your procedure is canceled in the cardioversion suite you will be charged a cancellation fee.

## 2020-08-28 NOTE — Progress Notes (Signed)
Patient returns for ECG after starting Multaq. He reports he has continued to see elevated heart rates associated with fatigue and dizziness. ECG today shows atypical atrial flutter with variable block HR 108, QRS 76, QTc 442. Given he has been persistently out of rhythm since the weekend, will arrange for DCCV. Recent labs reviewed. Patient in agreement with plan. F/u in the AF clinic one week post DCCV.

## 2020-08-29 ENCOUNTER — Other Ambulatory Visit (HOSPITAL_COMMUNITY)
Admission: RE | Admit: 2020-08-29 | Discharge: 2020-08-29 | Disposition: A | Discharge status: Home / Self Care | Payer: 59 | Source: Ambulatory Visit | Attending: Internal Medicine | Admitting: Internal Medicine

## 2020-08-29 DIAGNOSIS — Z01812 Encounter for preprocedural laboratory examination: Secondary | ICD-10-CM | POA: Insufficient documentation

## 2020-08-29 DIAGNOSIS — Z20822 Contact with and (suspected) exposure to covid-19: Secondary | ICD-10-CM | POA: Insufficient documentation

## 2020-08-29 LAB — SARS CORONAVIRUS 2 (TAT 6-24 HRS): SARS Coronavirus 2: NEGATIVE

## 2020-08-29 NOTE — Telephone Encounter (Signed)
Patient called wanting to know if Dr. Harl Bowie had responded to the phone note dated 08-28-2020.

## 2020-08-30 ENCOUNTER — Encounter (HOSPITAL_COMMUNITY): Payer: 59 | Admitting: Physician Assistant

## 2020-08-30 ENCOUNTER — Encounter: Payer: Self-pay | Admitting: *Deleted

## 2020-08-30 NOTE — Telephone Encounter (Signed)
Pt came by to pick up letter and now requested another letter extending this to November 8th - will forward to provider if ok to extend

## 2020-08-30 NOTE — Telephone Encounter (Signed)
Ok to provide the neccesary note   J Shlomo Seres MD

## 2020-08-30 NOTE — Telephone Encounter (Signed)
Pt requested note through 09/09/2020 - will come by [facility] office to pick up

## 2020-08-30 NOTE — Anesthesia Preprocedure Evaluation (Addendum)
Anesthesia Evaluation  Patient identified by MRN, date of birth, ID band Patient awake    Reviewed: Allergy & Precautions, NPO status , Patient's Chart, lab work & pertinent test results  History of Anesthesia Complications Negative for: history of anesthetic complications  Airway Mallampati: II  TM Distance: >3 FB Neck ROM: Full    Dental no notable dental hx. (+) Dental Advisory Given   Pulmonary sleep apnea ,    Pulmonary exam normal        Cardiovascular hypertension, Pt. on home beta blockers and Pt. on medications + CAD  + dysrhythmias Atrial Fibrillation  Rhythm:Irregular Rate:Tachycardia  Left Ventrical:  Normal LV function  Mitral Valve: normal, no MR   Aortic Valve:  3 leaflet valve,  No AS or AI   Tricuspid Valve: mild TR    Neuro/Psych CVA    GI/Hepatic Neg liver ROS, GERD  Medicated,  Endo/Other  diabetes, Type 2, Oral Hypoglycemic Agents  Renal/GU      Musculoskeletal  (+) Arthritis ,   Abdominal (+) + obese,   Peds  Hematology negative hematology ROS (+)   Anesthesia Other Findings   Reproductive/Obstetrics                            Anesthesia Physical  Anesthesia Plan  ASA: III  Anesthesia Plan: General   Post-op Pain Management:    Induction:   PONV Risk Score and Plan: 2 and Propofol infusion and Treatment may vary due to age or medical condition  Airway Management Planned: Mask  Additional Equipment: None  Intra-op Plan:   Post-operative Plan:   Informed Consent: I have reviewed the patients History and Physical, chart, labs and discussed the procedure including the risks, benefits and alternatives for the proposed anesthesia with the patient or authorized representative who has indicated his/her understanding and acceptance.     Dental advisory given  Plan Discussed with: Anesthesiologist and CRNA  Anesthesia Plan Comments: (Cancelled by  Cardiology)      Anesthesia Quick Evaluation

## 2020-08-31 ENCOUNTER — Ambulatory Visit (HOSPITAL_COMMUNITY): Payer: 59 | Admitting: Anesthesiology

## 2020-08-31 ENCOUNTER — Ambulatory Visit (HOSPITAL_COMMUNITY)
Admission: RE | Admit: 2020-08-31 | Discharge: 2020-08-31 | Disposition: A | Discharge status: Home / Self Care | Payer: 59 | Attending: Internal Medicine | Admitting: Internal Medicine

## 2020-08-31 ENCOUNTER — Other Ambulatory Visit: Payer: Self-pay

## 2020-08-31 ENCOUNTER — Encounter (HOSPITAL_COMMUNITY)
Admission: RE | Disposition: A | Discharge status: Home / Self Care | Payer: Self-pay | Source: Home / Self Care | Attending: Internal Medicine

## 2020-08-31 DIAGNOSIS — I4892 Unspecified atrial flutter: Secondary | ICD-10-CM | POA: Diagnosis not present

## 2020-08-31 DIAGNOSIS — E785 Hyperlipidemia, unspecified: Secondary | ICD-10-CM | POA: Insufficient documentation

## 2020-08-31 DIAGNOSIS — E1136 Type 2 diabetes mellitus with diabetic cataract: Secondary | ICD-10-CM | POA: Insufficient documentation

## 2020-08-31 DIAGNOSIS — I10 Essential (primary) hypertension: Secondary | ICD-10-CM | POA: Diagnosis not present

## 2020-08-31 DIAGNOSIS — I480 Paroxysmal atrial fibrillation: Secondary | ICD-10-CM | POA: Diagnosis not present

## 2020-08-31 DIAGNOSIS — I2510 Atherosclerotic heart disease of native coronary artery without angina pectoris: Secondary | ICD-10-CM | POA: Insufficient documentation

## 2020-08-31 DIAGNOSIS — G4733 Obstructive sleep apnea (adult) (pediatric): Secondary | ICD-10-CM | POA: Insufficient documentation

## 2020-08-31 DIAGNOSIS — Z7901 Long term (current) use of anticoagulants: Secondary | ICD-10-CM | POA: Diagnosis not present

## 2020-08-31 DIAGNOSIS — Z7984 Long term (current) use of oral hypoglycemic drugs: Secondary | ICD-10-CM | POA: Insufficient documentation

## 2020-08-31 DIAGNOSIS — Z79899 Other long term (current) drug therapy: Secondary | ICD-10-CM | POA: Insufficient documentation

## 2020-08-31 DIAGNOSIS — I4891 Unspecified atrial fibrillation: Secondary | ICD-10-CM | POA: Diagnosis present

## 2020-08-31 DIAGNOSIS — Z885 Allergy status to narcotic agent status: Secondary | ICD-10-CM | POA: Insufficient documentation

## 2020-08-31 DIAGNOSIS — M1990 Unspecified osteoarthritis, unspecified site: Secondary | ICD-10-CM | POA: Insufficient documentation

## 2020-08-31 DIAGNOSIS — Z5309 Procedure and treatment not carried out because of other contraindication: Secondary | ICD-10-CM | POA: Diagnosis not present

## 2020-08-31 LAB — GLUCOSE, CAPILLARY: Glucose-Capillary: 172 mg/dL — ABNORMAL HIGH (ref 70–99)

## 2020-08-31 SURGERY — CANCELLED PROCEDURE

## 2020-08-31 MED ORDER — SODIUM CHLORIDE 0.9 % IV SOLN: INTRAVENOUS | Status: AC | PRN

## 2020-08-31 NOTE — Interval H&P Note (Signed)
History and Physical Interval Note:  08/31/2020 7:39 AM  Lucas [name].  has presented today for surgery, with the diagnosis of Rapid A.Fib.  The various methods of treatment have been discussed with the patient and family. After consideration of risks, benefits and other options for treatment, the patient has consented to  Procedure(s): CARDIOVERSION ([name]) as a surgical intervention.  The patient's history has been reviewed, patient examined, no change in status, stable for surgery.  I have reviewed the patient's chart and labs.  Questions were answered to the patient's satisfaction.     Elouise Munroe

## 2020-08-31 NOTE — Progress Notes (Signed)
Confirmed by patient's Apple Watch tracings that he had paroxysmal AF yesterday. Intermittently converting to sinus rhythm. We participated in shared decision making an determined that cardioversion should be postponed at this time, and can be performed if AF is sustained. Follow up in AF clinic. Discussed with Dr. Curt Bears.

## 2020-09-03 NOTE — Telephone Encounter (Signed)
Can he contact us after his appt with afib clinic, im not sure what the long term plan for his arrhythmia is, this would come from afib clinic and Dr Curt Bears. Would know better out of work time once this is more clear  J Amarion Portell MD

## 2020-09-03 NOTE — Telephone Encounter (Signed)
Has f/u with afib clinic tomorrow 10/26

## 2020-09-04 ENCOUNTER — Ambulatory Visit (HOSPITAL_COMMUNITY)
Admission: RE | Admit: 2020-09-04 | Discharge: 2020-09-04 | Disposition: A | Discharge status: Home / Self Care | Payer: 59 | Source: Ambulatory Visit | Attending: Physician Assistant | Admitting: Physician Assistant

## 2020-09-04 ENCOUNTER — Telehealth: Payer: Self-pay | Admitting: Cardiology

## 2020-09-04 ENCOUNTER — Other Ambulatory Visit: Payer: Self-pay

## 2020-09-04 VITALS — BP 136/76 | HR 68 | Ht 70.0 in | Wt 268.4 lb

## 2020-09-04 DIAGNOSIS — I10 Essential (primary) hypertension: Secondary | ICD-10-CM | POA: Insufficient documentation

## 2020-09-04 DIAGNOSIS — G4733 Obstructive sleep apnea (adult) (pediatric): Secondary | ICD-10-CM | POA: Diagnosis not present

## 2020-09-04 DIAGNOSIS — D6869 Other thrombophilia: Secondary | ICD-10-CM | POA: Diagnosis not present

## 2020-09-04 DIAGNOSIS — I2510 Atherosclerotic heart disease of native coronary artery without angina pectoris: Secondary | ICD-10-CM | POA: Diagnosis not present

## 2020-09-04 DIAGNOSIS — I480 Paroxysmal atrial fibrillation: Secondary | ICD-10-CM | POA: Diagnosis not present

## 2020-09-04 DIAGNOSIS — Z9989 Dependence on other enabling machines and devices: Secondary | ICD-10-CM | POA: Diagnosis not present

## 2020-09-04 DIAGNOSIS — Z7901 Long term (current) use of anticoagulants: Secondary | ICD-10-CM | POA: Insufficient documentation

## 2020-09-04 DIAGNOSIS — Z7984 Long term (current) use of oral hypoglycemic drugs: Secondary | ICD-10-CM | POA: Insufficient documentation

## 2020-09-04 DIAGNOSIS — Z79899 Other long term (current) drug therapy: Secondary | ICD-10-CM | POA: Insufficient documentation

## 2020-09-04 DIAGNOSIS — E119 Type 2 diabetes mellitus without complications: Secondary | ICD-10-CM | POA: Insufficient documentation

## 2020-09-04 NOTE — Progress Notes (Signed)
Primary Care Physician: Glenda Chroman, MD Referring Physician: Dr. Harl Bowie  Primary EP: Dr Daphene Calamity. is a 64 y.o. male with a h/o CAD, HTN, DM,  typical atrial  flutter ablation in 2017, paroxysmal afib that has had 2 episodes of afib with RVR with  chest pain that occurs with afib, since last week. [name] He was treated in the ER 6/1 with chest pain and was in and out of afib so no changes were made. He is on metoprolol 100 mg bid and xarelto for a CHA2DS2VASc score of 4. He is now here to discuss increase in afib episodes. He also states that he has noted an increase of shortness of breath and intermittent chest tightness since February, when he returned to work, after carpel wrist surgery. He has to wear a mask at work and was thinking it was from  mask wear. However, he has to walk up a small incline on his way to work and has to slow down for the shortness of breath and mild chest discomfort, and does not have the mask on at that point. He does have untreated sleep  apnea. No alcohol, excessive caffeine or tobacco use. He is pending an appointment with Dr. Curt Bears in July. Last echo in 2017 and last cath in 2019.  F/u in afib clinic, 8/8. PT had a [facility] admission since I last saw him for afib with RVR and chest apain. HE wa on his way to work when he felt it and decided to stop by the ER. Troponin negative. Had echo with LVH with EF at 690-65%Today, I discussed Coumadin as well as novel anticoagulants including Pradaxa, Xarelto, Savaysa, and Eliquis today as indicated for risk reduction in stroke and systemic emboli with nonvalvular atrial fibrillation.  Risks, benefits, and alternatives to each of these drugs were discussed at length today. No wall motion abnormalities. IT was not flet that pt needed a stress test/cath. Cardizem 60 mg bid was added to his Toprol and no further afib. We discussed trying Multaq but he would prefer not to add any more meds and discuss with Dr. Curt Bears  front   line ablation 7/29. He also has an appointment with Dr. Claiborne Billings to discuss restarting his cpap as he has not used for a few years. I told him his success from ablation or antiarrythmic's  would be lowered if sleep apnea was not treated.   Follow up in the AF clinic 08/27/20. Patient is s/p afib ablation with Dr Curt Bears on 08/24/20. He reports that he noted a rapid heart beat on 08/26/20. He has a smart watch which has shown afib and what appears to be SVT vs atrial flutter. He was in SR on presentation today but became tachycardic during the visit.   Follow up in the AF clinic 09/04/20. Patient presented for DCCV on 08/31/20 but was paroxsymal at that time and so DCCV was deferred. He was back in SR when he woke on Sunday and has been in [place] since. He does still endorse fatigue and some weight gain.   Today, he denies symptoms of palpitations, chest pain, shortness of breath, orthopnea, PND, lower extremity edema, presyncope, syncope, or neurologic sequela. The patient is tolerating medications without difficulties and is otherwise without complaint today.   Past Medical History:  Diagnosis Date  . Arthritis   . Bilateral carpal tunnel syndrome 08/16/2019  . CAD (coronary artery disease)    a. LHC on 01/29/16 which revealed  significant apical LAD stenosis, best treated medically. There was moderate disease in the D2 and mid nondominant RCA.[name] No PCI performed b. 03/2018: repeat cath showing regression of his LAD stenosis now being at 50% with 60% mid RCA stenosis, 45% ostial LPDA stenosis, and 45% ostial second diagonal stenosis.  . Cataract   . Diabetes mellitus    type 2  . GERD (gastroesophageal reflux disease)   . Heart disease   . Hyperlipidemia   . Hypertension   . Obesity   . PAF (paroxysmal atrial fibrillation) ([place]) 01/2016  . Sleep apnea    has CPAP machine but not wear  . Tubular adenoma of colon 08/2012  . 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 ANKLE SURGERY Left 2012   tendon repair  . ATRIAL FIBRILLATION ABLATION [name] 08/24/2020   Procedure: ATRIAL FIBRILLATION ABLATION;  Surgeon: Constance Haw, MD;  Location: [facility];  Service: Cardiovascular;  Laterality: [name];  . CARDIAC CATHETERIZATION [name] 01/29/2016   Procedure: Left Heart Cath and Coronary Angiography;  Surgeon: [name], MD;  Location: [facility];  Service: Cardiovascular;  Laterality: [name];  . CARPAL TUNNEL RELEASE Right 09/06/2019   Procedure: CARPAL TUNNEL RELEASE;  Surgeon: Daryll Brod, MD;  Location: [facility];  Service: Orthopedics;  Laterality: Right;  . CARPAL TUNNEL RELEASE Left 10/11/2019   Procedure: LEFT CARPAL TUNNEL RELEASE;  Surgeon: Daryll Brod, MD;  Location: [facility];  Service: Orthopedics;  Laterality: Left;  IV REGIONAL FOREARM BLOCK  . CATARACT EXTRACTION W/PHACO  11/11/2012   Procedure: CATARACT EXTRACTION PHACO AND INTRAOCULAR LENS PLACEMENT (IOC);  Surgeon: [name], MD;  Location: [facility];  Service: Ophthalmology;  Laterality: Right;  CDE:  5.56  . CATARACT EXTRACTION W/PHACO Left 12/15/2013   Procedure: CATARACT EXTRACTION PHACO AND INTRAOCULAR LENS PLACEMENT (IOC);  Surgeon: [name], MD;  Location: [facility];  Service: Ophthalmology;  Laterality: Left;  CDE:13.36  . CHOLECYSTECTOMY    . CHONDROPLASTY Right 05/28/2017   Procedure: RIGHT KNEE CHONDROPLASTY;  Surgeon: [name], MD;  Location: [facility];  Service: Orthopedics;  Laterality: Right;  . COLONOSCOPY    . CYSTOSCOPY W/ URETERAL STENT PLACEMENT     right  . ELECTROPHYSIOLOGIC STUDY [name] 06/02/2016   Procedure: A-Flutter Ablation;  Surgeon: [name], MD;  Location: [facility];  Service: Cardiovascular;  Laterality: [name];  . KNEE ARTHROSCOPY WITH MEDIAL MENISECTOMY Right 05/28/2017   Procedure: KNEE ARTHROSCOPY WITH MEDIAL MENISECTOMY WITH EXTENSIVE SYNOVECTOMY;   Surgeon: [name], MD;  Location: [facility];  Service: Orthopedics;  Laterality: Right;  . LEFT HEART CATH AND CORONARY ANGIOGRAPHY [name] 03/30/2018   Procedure: LEFT HEART CATH AND CORONARY ANGIOGRAPHY;  Surgeon: Leonie Man, MD;  Location: [facility];  Service: Cardiovascular;  Laterality: [name];  . LEFT HEART CATH AND CORONARY ANGIOGRAPHY [name] 07/27/2020   Procedure: LEFT HEART CATH AND CORONARY ANGIOGRAPHY;  Surgeon: Sherren Mocha, MD;  Location: [facility];  Service: Cardiovascular;  Laterality: [name];  . left knee sugery Left    Arthroscopy  . NASAL SINUS SURGERY    . POLYPECTOMY    . TEE WITHOUT CARDIOVERSION [name] 08/23/2020   Procedure: TRANSESOPHAGEAL ECHOCARDIOGRAM (TEE);  Surgeon: Acie Fredrickson Wonda Cheng, MD;  Location: [facility];  Service: Cardiovascular;  Laterality: [name];  . TONSILLECTOMY      Current  Outpatient Medications  Medication Sig Dispense Refill  . acetaminophen (TYLENOL) 325 MG tablet Take 650 mg by mouth every 6 (six) hours as needed for moderate pain or headache.     [name] atorvastatin (LIPITOR) 80 MG tablet TAKE 1 TABLET (80 MG TOTAL) BY MOUTH DAILY AT 6 PM. 90 tablet 3  . diltiazem (CARDIZEM CD) 240 MG 24 hr capsule Take 1 capsule (240 mg total) by mouth daily. (Patient taking differently: Take 240 mg by mouth every evening. ) 90 capsule 0  . dronedarone (MULTAQ) 400 MG tablet Take 1 tablet (400 mg total) by mouth 2 (two) times daily with a meal. 60 tablet 3  . furosemide (LASIX) 20 MG tablet Take 20 mg by mouth daily as needed for fluid.     [name] glimepiride (AMARYL) 4 MG tablet Take 4 mg by mouth 2 (two) times daily.     . isosorbide mononitrate (IMDUR) 120 MG 24 hr tablet Take 1 tablet (120 mg total) by mouth daily. 90 tablet 3  . metoprolol succinate (TOPROL-XL) 100 MG 24 hr tablet TAKE 1 AND 1/2 TABLETS BY MOUTH DAILY WITH OR IMMEDIATELY FOLLOWING A MEAL (Patient taking differently: Take 100 mg by mouth in the morning and at bedtime. )  135 tablet 3  . nitroGLYCERIN (NITROSTAT) 0.4 MG SL tablet Place 1 tablet (0.4 mg total) under the tongue every 5 (five) minutes as needed for chest pain. (Patient taking differently: Place 0.4 mg under the tongue every 5 (five) minutes x 3 doses as needed for chest pain. ) 25 tablet 3  . OZEMPIC, 0.25 OR 0.5 MG/DOSE, 2 MG/1.5ML SOPN Inject 0.5 mg into the skin every Saturday.     . pantoprazole (PROTONIX) 40 MG tablet Take 40 mg by mouth daily before breakfast.     . SYNJARDY XR 12.03-999 MG TB24 Take 1 tablet by mouth in the morning and at bedtime.    [name] 20 MG TABS tablet TAKE 1 TABLET (20 MG TOTAL) BY MOUTH DAILY WITH SUPPER. (Patient taking differently: Take 20 mg by mouth daily with supper. ) 90 tablet 3  . HYDROcodone-acetaminophen (NORCO) 5-325 MG tablet Take 1 tablet by mouth every 6 (six) hours as needed. (Patient not taking: Reported on 09/04/2020) 20 tablet 0   No current facility-administered medications for this encounter.   Facility-Administered Medications Ordered in Other Encounters  Medication Dose Route Frequency Provider Last Rate Last Admin  . 0.9 %  sodium chloride infusion   Intravenous Continuous PRN Trinna Post., CRNA   New Bag at 08/31/20 0745    Allergies  Allergen Reactions  . Bee Venom Swelling  . Codeine Nausea Only    Social History   Socioeconomic History  . Marital status: Married    Spouse name: Not on file  . Number of children: Not on file  . Years of education: 32  . Highest education level: Not on file  Occupational History  . Not on file  Tobacco Use  . Smoking status: Never Smoker  . Smokeless tobacco: Never Used  Vaping Use  . Vaping Use: Never used  Substance and Sexual Activity  . Alcohol use: Yes    Alcohol/week: 0.0 standard drinks    Comment: rare occasion  . Drug use: No  . Sexual activity: Yes    Birth control/protection: None  Other Topics Concern  . Not on file  Social History Narrative   Right handed    Caffeine~ 1-2 cups per day   Lives at home  with wife Shirlean Mylar    Social Determinants of Health   Financial Resource Strain:   . Difficulty of Paying Living Expenses: Not on file  Food Insecurity:   . Worried About Charity fundraiser in the Last Year: Not on file  . Ran Out of Food in the Last Year: Not on file  Transportation Needs:   . Lack of Transportation (Medical): Not on file  . Lack of Transportation (Non-Medical): Not on file  Physical Activity:   . Days of Exercise per Week: Not on file  . Minutes of Exercise per Session: Not on file  Stress:   . Feeling of Stress : Not on file  Social Connections:   . Frequency of Communication with Friends and Family: Not on file  . Frequency of Social Gatherings with Friends and Family: Not on file  . Attends Religious Services: Not on file  . Active Member of Clubs or Organizations: Not on file  . Attends Archivist Meetings: Not on file  . Marital Status: Not on file  Intimate Partner Violence:   . Fear of Current or Ex-Partner: Not on file  . Emotionally Abused: Not on file  . Physically Abused: Not on file  . Sexually Abused: Not on file    Family History  Problem Relation Age of Onset  . Heart murmur Mother   . Aneurysm Mother   . Hypertension Mother   . Cirrhosis Father   . Heart attack Sister   . Diabetes Sister   . Aneurysm Maternal Grandmother   . Stroke Maternal Grandmother   . Heart attack Paternal Grandmother   . Stroke Sister   . Colon cancer Neg Hx   . Rectal cancer Neg Hx   . Stomach cancer Neg Hx     ROS- All systems are reviewed and negative except as per the HPI above  Physical Exam: Vitals:   09/04/20 1321  BP: 136/76  Pulse: 68  Weight: 121.6 kg  Height: 5\' 10"  (1.778 m)   Wt Readings from Last 3 Encounters:  09/04/20 121.6 kg  08/31/20 119 kg  08/28/20 119.1 kg    Labs: Lab Results  Component Value Date   NA 139 08/22/2020   K 3.9 08/22/2020   CL 105 08/22/2020   CO2 26  08/22/2020   GLUCOSE 106 (H) 08/22/2020   BUN 20 08/22/2020   CREATININE 0.85 08/22/2020   CALCIUM 9.0 08/22/2020   MG 1.7 07/04/2020   Lab Results  Component Value Date   INR 1.17 01/29/2016   Lab Results  Component Value Date   CHOL 195 01/29/2016   HDL 39 (L) 01/29/2016   LDLCALC 129 (H) 01/29/2016   TRIG 133 01/29/2016    GEN- The patient is well appearing obese male, alert and oriented x 3 today.   HEENT-head normocephalic, atraumatic, sclera clear, conjunctiva pink, hearing intact, trachea midline. Lungs- Clear to ausculation bilaterally, normal work of breathing Heart- Regular rate and rhythm, no murmurs, rubs or gallops  GI- soft, NT, ND, + BS Extremities- no clubbing, cyanosis, or edema MS- no significant deformity or atrophy Skin- no rash or lesion Psych- euthymic mood, full affect Neuro- strength and sensation are intact   EKG- SR HR 68, PR 160, QRS 82, QTc 429  Echo- 05/09/20- FINDINGS  Left Ventricle: Left ventricular ejection fraction, by estimation, is 60  to 65%. The left ventricle has normal function. The left ventricle has no  regional wall motion abnormalities. Definity contrast agent was given  IV  to delineate the left ventricular  endocardial borders. The left ventricular internal cavity size was normal  in size. There is moderate left ventricular hypertrophy. Left ventricular  diastolic parameters are indeterminate.   Assessment and Plan: 1. Paroxsymal afib/atrial flutter S/p afib ablation with Dr Curt Bears 08/24/20.  Patient intermittently in SR, now appears stabilized.  Continue Multaq 400 mg BID Continue Xarelto 20 mg daily Patient was taking Toprol 100 mg BID. Will decrease to 50 mg AM and 100 mg PM given fatigue.  Will have him take his Lasix daily x3 days. Then reduce back to PRN.  Continue diltiazem 240 mg daily OK from an afib standpoint to return to work. Plan to return Nov 1st per Dr Harl Bowie.   2. CAD Diffuse nonobstructive disease  on [facility] 07/27/20. No anginal symptoms.  3. CHA2DS2VASc score of 4  Continue xarelto 20 mg daily   4. OSA Patient reports compliance with CPAP therapy. Followed by Dr Claiborne Billings.   Follow up with Dr Claiborne Billings, Dr Curt Bears, and Dr Harl Bowie as scheduled.    [facility] [address] [place], [place] [address] [phone]

## 2020-09-04 NOTE — Addendum Note (Signed)
Encounter addended by: Enid Derry, CMA on: 09/04/2020 2:48 PM  Actions taken: Vitals modified

## 2020-09-04 NOTE — Patient Instructions (Signed)
Decrease metoprolol to 50mg  in the morning and 100mg  in the evening.  Take 20mg  of lasix daily for then next 3 days then only as needed for weight gain.

## 2020-09-05 ENCOUNTER — Telehealth: Payer: Self-pay | Admitting: Cardiology

## 2020-09-05 NOTE — Telephone Encounter (Signed)
Spoke with pt who stated that he is still dizzy with bending over. He feels that he will get use to the medication change by next week.

## 2020-09-05 NOTE — Telephone Encounter (Signed)
From Afib appointment yesterday seems he was doing fairly well, what is the reason for the extra week?   Carlyle Dolly MD

## 2020-09-05 NOTE — Telephone Encounter (Signed)
Pt was seen by afib clinic and per notes says pt may return to work on 11/1 which pt already has note for

## 2020-09-05 NOTE — Telephone Encounter (Signed)
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  The original note that he has right now says to return to work on Monday, 09/10/2020. Please call patient regarding the change of the date.

## 2020-09-06 ENCOUNTER — Other Ambulatory Visit: Payer: Self-pay | Admitting: Student

## 2020-09-06 DIAGNOSIS — M48061 Spinal stenosis, lumbar region without neurogenic claudication: Secondary | ICD-10-CM

## 2020-09-07 ENCOUNTER — Ambulatory Visit (HOSPITAL_COMMUNITY): Payer: 59 | Admitting: Physician Assistant

## 2020-09-07 ENCOUNTER — Encounter: Payer: Self-pay | Admitting: *Deleted

## 2020-09-07 NOTE — Telephone Encounter (Signed)
Letter written and patient notified.  

## 2020-09-07 NOTE — Telephone Encounter (Signed)
Can extend work leave to 09/17/20 as requested   Zandra Abts MD

## 2020-09-13 ENCOUNTER — Ambulatory Visit: Payer: 59 | Admitting: Cardiovascular Disease

## 2020-09-17 ENCOUNTER — Telehealth: Payer: Self-pay | Admitting: Cardiology

## 2020-09-17 NOTE — Telephone Encounter (Signed)
New message     Patient needs note to go back to work - the nurse at his job sent him home because he had AFIB after getting the Covid booster shot . Is this something to be concerned about?

## 2020-09-17 NOTE — Telephone Encounter (Signed)
Pt says he returned to work today and told nurse after he had his covid booster shot last Saturday he was in Afib for an hour or so - also says he has had several episodes of afib that are not lasting but 30 mins at a time - says in the last week he has had 2-3 episodes and when he told the nurse at work this she sent him home and told him to contact us if he was ok to go back to work even with return to work letter since he has had symptoms since letter was done. Pt denies any symptoms for the last few days - was at breakfast with wife and didn't have notes on what his HR/BP was during the episodes

## 2020-09-18 NOTE — Telephone Encounter (Signed)
Please forward to afib clinic who has been managing his antiarrhthmic therapy   Zandra Abts MD

## 2020-09-19 ENCOUNTER — Encounter (HOSPITAL_COMMUNITY): Payer: Self-pay | Admitting: *Deleted

## 2020-09-19 NOTE — Telephone Encounter (Signed)
Letter sent to patient. Per ricky fenton pa pt can return to work without restrictions. Breakthrough afib is expected intermittently. Pt verbalized understanding.

## 2020-09-21 ENCOUNTER — Other Ambulatory Visit (HOSPITAL_COMMUNITY): Payer: Self-pay | Admitting: Physician Assistant

## 2020-09-21 ENCOUNTER — Ambulatory Visit (HOSPITAL_COMMUNITY): Payer: 59 | Admitting: Nurse Practitioner

## 2020-09-22 ENCOUNTER — Other Ambulatory Visit: Payer: Self-pay

## 2020-09-22 ENCOUNTER — Ambulatory Visit
Admission: RE | Admit: 2020-09-22 | Discharge: 2020-09-22 | Disposition: A | Discharge status: Home / Self Care | Payer: 59 | Source: Ambulatory Visit | Attending: Student | Admitting: Student

## 2020-09-22 DIAGNOSIS — M48061 Spinal stenosis, lumbar region without neurogenic claudication: Secondary | ICD-10-CM

## 2020-09-26 ENCOUNTER — Other Ambulatory Visit: Payer: Self-pay | Admitting: Family Medicine

## 2020-09-28 ENCOUNTER — Other Ambulatory Visit (HOSPITAL_COMMUNITY): Payer: Self-pay | Admitting: *Deleted

## 2020-09-28 MED ORDER — DILTIAZEM HCL ER COATED BEADS 240 MG PO CP24: 240.0000 mg | ORAL_CAPSULE | Freq: Every evening | ORAL | 1 refills | Status: DC

## 2020-10-02 ENCOUNTER — Other Ambulatory Visit: Payer: Self-pay | Admitting: Student

## 2020-10-02 DIAGNOSIS — M48061 Spinal stenosis, lumbar region without neurogenic claudication: Secondary | ICD-10-CM

## 2020-10-23 ENCOUNTER — Other Ambulatory Visit: Payer: Self-pay

## 2020-10-23 ENCOUNTER — Ambulatory Visit
Admission: RE | Admit: 2020-10-23 | Discharge: 2020-10-23 | Disposition: A | Discharge status: Home / Self Care | Payer: 59 | Source: Ambulatory Visit | Attending: Student | Admitting: Student

## 2020-10-23 DIAGNOSIS — M48061 Spinal stenosis, lumbar region without neurogenic claudication: Secondary | ICD-10-CM

## 2020-11-24 ENCOUNTER — Other Ambulatory Visit: Payer: Self-pay | Admitting: Cardiology

## 2020-11-28 ENCOUNTER — Ambulatory Visit: Payer: 59 | Admitting: Cardiology

## 2020-11-28 ENCOUNTER — Telehealth: Payer: Self-pay | Admitting: Cardiology

## 2020-11-28 ENCOUNTER — Encounter: Payer: Self-pay | Admitting: Cardiology

## 2020-11-28 VITALS — BP 128/70 | HR 69 | Ht 70.0 in | Wt 276.2 lb

## 2020-11-28 DIAGNOSIS — I2510 Atherosclerotic heart disease of native coronary artery without angina pectoris: Secondary | ICD-10-CM

## 2020-11-28 DIAGNOSIS — I10 Essential (primary) hypertension: Secondary | ICD-10-CM | POA: Diagnosis not present

## 2020-11-28 DIAGNOSIS — I480 Paroxysmal atrial fibrillation: Secondary | ICD-10-CM

## 2020-11-28 NOTE — Patient Instructions (Signed)
Medication Instructions:  Continue all current medications.   Labwork: none  Testing/Procedures: none  Follow-Up: 6 months   Any Other Special Instructions Will Be Listed Below (If Applicable).   If you need a refill on your cardiac medications before your next appointment, please call your pharmacy.  

## 2020-11-28 NOTE — Telephone Encounter (Signed)
   [facility] Pre-operative Risk Assessment    HEARTCARE STAFF: - Please ensure there is not already an duplicate clearance open for this procedure. - Under Visit Info/Reason for Call, type in Other and utilize the format Clearance MM/DD/YY or Clearance TBD. Do not use dashes or single digits. - If request is for dental extraction, please clarify the # of teeth to be extracted.  Request for surgical clearance:  1. What type of surgery is being performed? Right shoulder scope  2. When is this surgery scheduled? TBD  3. What type of clearance is required (medical clearance vs. Pharmacy clearance to hold med vs. Both)? Pharmacy   4. Are there any medications that need to be held prior to surgery and how long? Please advise   5. Practice name and name of physician performing surgery? [facility]  Dax Varkey  6. What is the office phone number? [phone]   7.   What is the office fax number? [phone]  8.   Anesthesia type (None, local, MAC, general) ? Not specified    Jannet Askew 11/28/2020, 9:34 AM  _________________________________________________________________   (provider comments below)

## 2020-11-28 NOTE — Progress Notes (Signed)
Clinical Summary Lucas [name] is a 65 y.o.male seen today for follow up of the following medical problems   1. Aflutter/Afib - CHADS2Vasc score of 2, on xarelto - s/p aflutter ablation 05/2016 by Dr Elberta Fortisamnitz. 01/2019 event monitor: short episode of afib, SR with PACs - toprol was increased to 150mg  daily   - afib ablation 08/2020 - started on multaq, followed by EP clinic - infrequent symptoms since ablation, overall doing well    2. CAD - previous chest pain episode occurred in the setting of afib with RVR - cath 01/29/16 with apical LAD disease, treated medically.Moderate RCA and D2 disease.  - 03/2018 cath distal LAD 50%, mid RCA 60%, LPDA 45%, D2 45%, mildly elevated LVEDP of 18  07/2020 cath: 1.  Left dominant coronary anatomy with diffuse nonobstructive coronary artery disease, no targets for PCI. 2.  Small vessel disease involving the distal circumflex Madden Piazza and a nondominant RCA 3.  Normal LVEDP  - no recent chest pains - no SOB/DOE   [facility]. [facility] ordered by pcp showed 4.3 ascending aortic aneurysm - he reports has not had repeat CT since  12/2019 stable 40mm ascending aorta  4. HTN - compliant with meds  5. Preoperative evalutation - planning right shoulder scope   SH: works [employer]. Has a 67 yo Fijiyorkey Past Medical History:  Diagnosis Date  . Arthritis   . Bilateral carpal tunnel syndrome 08/16/2019  . CAD (coronary artery disease)    a. LHC on 01/29/16 which revealed significant apical LAD stenosis, best treated medically. There was moderate disease in the D2 and mid nondominant RCA.[name]. No PCI performed b. 03/2018: repeat cath showing regression of his LAD stenosis now being at 50% with 60% mid RCA stenosis, 45% ostial LPDA stenosis, and 45% ostial second diagonal stenosis.  . Cataract   . Diabetes mellitus    type 2  . GERD (gastroesophageal reflux disease)   . Heart disease   . Hyperlipidemia   .  Hypertension   . Obesity   . PAF (paroxysmal atrial fibrillation) (HCC) 01/2016  . Sleep apnea    has CPAP machine but not wear  . Tubular adenoma of colon 08/2012  . Ulnar neuropathy at elbow 08/16/2019   Bilateral     Allergies  Allergen Reactions  . Bee Venom Swelling  . Codeine Nausea Only     Current Outpatient Medications  Medication Sig Dispense Refill  . acetaminophen (TYLENOL) 325 MG tablet Take 650 mg by mouth every 6 (six) hours as needed for moderate pain or headache.     [name]. atorvastatin (LIPITOR) 80 MG tablet TAKE 1 TABLET (80 MG TOTAL) BY MOUTH DAILY AT 6 PM. 90 tablet 1  . diltiazem (CARDIZEM CD) 240 MG 24 hr capsule Take 1 capsule (240 mg total) by mouth every evening. 90 capsule 1  . furosemide (LASIX) 20 MG tablet Take 20 mg by mouth daily as needed for fluid.     [name]. glimepiride (AMARYL) 4 MG tablet Take 4 mg by mouth 2 (two) times daily.     [name]. HYDROcodone-acetaminophen (NORCO) 5-325 MG tablet Take 1 tablet by mouth every 6 (six) hours as needed. (Patient not taking: Reported on 09/04/2020) 20 tablet 0  . isosorbide mononitrate (IMDUR) 120 MG 24 hr tablet TAKE 1 TABLET BY MOUTH EVERY DAY 90 tablet 1  . metoprolol succinate (TOPROL-XL) 100 MG 24 hr tablet TAKE 1 AND 1/2 TABLETS BY MOUTH DAILY WITH  OR IMMEDIATELY FOLLOWING A MEAL (Patient taking differently: Take 100 mg by mouth in the morning and at bedtime. ) 135 tablet 3  . MULTAQ 400 MG tablet TAKE 1 TABLET (400 MG TOTAL) BY MOUTH 2 (TWO) TIMES DAILY WITH A MEAL. 60 tablet 11  . nitroGLYCERIN (NITROSTAT) 0.4 MG SL tablet Place 1 tablet (0.4 mg total) under the tongue every 5 (five) minutes as needed for chest pain. (Patient taking differently: Place 0.4 mg under the tongue every 5 (five) minutes x 3 doses as needed for chest pain. ) 25 tablet 3  . OZEMPIC, 0.25 OR 0.5 MG/DOSE, 2 MG/1.5ML SOPN Inject 0.5 mg into the skin every Saturday.     . pantoprazole (PROTONIX) 40 MG tablet Take 40 mg by mouth daily before  breakfast.     . SYNJARDY XR 12.03-999 MG TB24 Take 1 tablet by mouth in the morning and at bedtime.    [name] 20 MG TABS tablet TAKE 1 TABLET (20 MG TOTAL) BY MOUTH DAILY WITH SUPPER. 30 tablet 3   No current facility-administered medications for this visit.   Facility-Administered Medications Ordered in Other Visits  Medication Dose Route Frequency Provider Last Rate Last Admin  . 0.9 %  sodium chloride infusion   Intravenous Continuous PRN Trinna Post., CRNA   New Bag at 08/31/20 0745     Past Surgical History:  Procedure Laterality Date  . ANKLE SURGERY Left 2012   tendon repair  . ATRIAL FIBRILLATION ABLATION [name] 08/24/2020   Procedure: ATRIAL FIBRILLATION ABLATION;  Surgeon: Constance Haw, MD;  Location: [facility];  Service: Cardiovascular;  Laterality: [name];  . CARDIAC CATHETERIZATION [name] 01/29/2016   Procedure: Left Heart Cath and Coronary Angiography;  Surgeon: [name], MD;  Location: [facility];  Service: Cardiovascular;  Laterality: [name];  . CARPAL TUNNEL RELEASE Right 09/06/2019   Procedure: CARPAL TUNNEL RELEASE;  Surgeon: Daryll Brod, MD;  Location: [place];  Service: Orthopedics;  Laterality: Right;  . CARPAL TUNNEL RELEASE Left 10/11/2019   Procedure: LEFT CARPAL TUNNEL RELEASE;  Surgeon: Daryll Brod, MD;  Location: [place];  Service: Orthopedics;  Laterality: Left;  IV REGIONAL FOREARM BLOCK  . CATARACT EXTRACTION W/PHACO  11/11/2012   Procedure: CATARACT EXTRACTION PHACO AND INTRAOCULAR LENS PLACEMENT (IOC);  Surgeon: Tonny Evolette Pendell, MD;  Location: [facility];  Service: Ophthalmology;  Laterality: Right;  CDE:  5.56  . CATARACT EXTRACTION W/PHACO Left 12/15/2013   Procedure: CATARACT EXTRACTION PHACO AND INTRAOCULAR LENS PLACEMENT (IOC);  Surgeon: Tonny Ethell Blatchford, MD;  Location: [facility];  Service: Ophthalmology;  Laterality: Left;  CDE:13.36  . CHOLECYSTECTOMY    . CHONDROPLASTY Right 05/28/2017   Procedure: RIGHT KNEE  CHONDROPLASTY;  Surgeon: [name], MD;  Location: [place];  Service: Orthopedics;  Laterality: Right;  . COLONOSCOPY    . CYSTOSCOPY W/ URETERAL STENT PLACEMENT     right  . ELECTROPHYSIOLOGIC STUDY [name] 06/02/2016   Procedure: A-Flutter Ablation;  Surgeon: [name], MD;  Location: [place] [facility];  Service: Cardiovascular;  Laterality: [name];  . KNEE ARTHROSCOPY WITH MEDIAL MENISECTOMY Right 05/28/2017   Procedure: KNEE ARTHROSCOPY WITH MEDIAL MENISECTOMY WITH EXTENSIVE SYNOVECTOMY;  Surgeon: [name], MD;  Location: [place];  Service: Orthopedics;  Laterality: Right;  . LEFT HEART CATH AND CORONARY ANGIOGRAPHY [name] 03/30/2018   Procedure: LEFT HEART CATH AND CORONARY ANGIOGRAPHY;  Surgeon: Leonie Man, MD;  [facility]  INVASIVE [facility];  Service: Cardiovascular;  Laterality: [name];  . LEFT HEART CATH AND CORONARY ANGIOGRAPHY [name] 07/27/2020   Procedure: LEFT HEART CATH AND CORONARY ANGIOGRAPHY;  Surgeon: Tonny Bollman, MD;  Location: [facility] INVASIVE CV LAB;  Service: Cardiovascular;  Laterality: [name];  . left knee sugery Left    Arthroscopy  . NASAL SINUS SURGERY    . POLYPECTOMY    . TEE WITHOUT CARDIOVERSION [name] 08/23/2020   Procedure: TRANSESOPHAGEAL ECHOCARDIOGRAM (TEE);  Surgeon: Elease Hashimoto Deloris Ping, MD;  Location: [facility];  Service: Cardiovascular;  Laterality: [name];  . TONSILLECTOMY       Allergies  Allergen Reactions  . Bee Venom Swelling  . Codeine Nausea Only      Family History  Problem Relation Age of Onset  . Heart murmur Mother   . Aneurysm Mother   . Hypertension Mother   . Cirrhosis Father   . Heart attack Sister   . Diabetes Sister   . Aneurysm Maternal Grandmother   . Stroke Maternal Grandmother   . Heart attack Paternal Grandmother   . Stroke Sister   . Colon cancer Neg Hx   . Rectal cancer Neg Hx   . Stomach cancer Neg Hx      Social History Mr. Sizelove reports that he has never smoked. He  has never used smokeless tobacco. Mr. Kawalec reports current alcohol use.   Review of Systems CONSTITUTIONAL: No weight loss, fever, chills, weakness or fatigue.  HEENT: Eyes: No visual loss, blurred vision, double vision or yellow sclerae.No hearing loss, sneezing, congestion, runny nose or sore throat.  SKIN: No rash or itching.  CARDIOVASCULAR: per hpi RESPIRATORY: No shortness of breath, cough or sputum.  GASTROINTESTINAL: No anorexia, nausea, vomiting or diarrhea. No abdominal pain or blood.  GENITOURINARY: No burning on urination, no polyuria NEUROLOGICAL: No headache, dizziness, syncope, paralysis, ataxia, numbness or tingling in the extremities. No change in bowel or bladder control.  MUSCULOSKELETAL: No muscle, back pain, joint pain or stiffness.  LYMPHATICS: No enlarged nodes. No history of splenectomy.  PSYCHIATRIC: No history of depression or anxiety.  ENDOCRINOLOGIC: No reports of sweating, cold or heat intolerance. No polyuria or polydipsia.  [name]   Physical Examination Today's Vitals   11/28/20 1404  BP: 128/70  Pulse: 69  SpO2: 97%  Weight: 276 lb 3.2 oz (125.3 kg)  Height: 5\' 10"  (1.778 m)   Body mass index is 39.63 kg/m.  Gen: resting comfortably, no acute distress HEENT: no scleral icterus, pupils equal round and reactive, no palptable cervical adenopathy,  CV: RRR, no mr/g, no jvd Resp: Clear to auscultation bilaterally GI: abdomen is soft, non-tender, non-distended, normal bowel sounds, no hepatosplenomegaly MSK: extremities are warm, no edema.  Skin: warm, no rash Neuro:  no focal deficits Psych: appropriate affect   Diagnostic Studies  01/2016 echo Study Conclusions  - Left ventricle: The cavity size was mildly dilated. Wall  thickness was increased in a pattern of mild LVH. Systolic  function was normal. The estimated ejection fraction was in the  range of 55% to 60%.  01/2016 cath 1. Dist LAD lesion, 80% stenosed. 2. Mid RCA lesion, 60%  stenosed. 3. Ost 2nd Diag to 2nd Diag lesion, 60% stenosed. 4. 2nd Mrg lesion, 30% stenosed. 5. Mid LAD lesion, 25% stenosed. 6. LPDA lesion, 45% stenosed.   Significant apical LAD stenosis, best treated with medication. Moderate disease in the second diagonal and mid nondominant RCA.  Normal left ventricular systolic function. Normal left ventricular filling pressures.  Post Cath Recommendations:   Medical therapy of both atrial fibrillation and coronary artery disease. Percutaneous intervention is not currently indicated for coronary disease.  03/2018 cath  Dist LAD lesion is 50% stenosed. -Demonstrates regression of disease  Mid RCA (nondominant) stable lesion is 60% stenosed. Stable  Ost LPDA to LPDA lesion is 45% stenosed. Stable  Ost 2nd Diag lesion is 45% stenosed. Demonstrates progression of disease  The left ventricular systolic function is normal. The left ventricular ejection fraction is 55-65% by visual estimate.  LV end diastolic pressure is mildly elevated.  Angiographically stable to improved coronary disease with notably less significant disease in the diagonal Cedra Villalon as well as the apical LAD.  No potential culprit lesion to explain the patient's symptoms. Mildly elevated LVEDP.  Plan:  Discharge home after TR band removal and bedrest.  Restart Xarelto tonight.  01/2019 event monitor  14 day event monitor  Min HR 54, Max HR 120, Avg HR 76  Sinus rhythm with PACs, with episode of afib rate 120  Reported symptoms correlated with sinus rhythm and PACs    Assessment and Plan  1. Aflutter/Afib - doing well, continue current meds - has f/u with EP next week   2. CAD -no symptoms, continue current meds   3. HTN - bp is at goal, continue current meds   4. Aortic aneurysm - has been stable, repeat scan later this year.   5. Preoperative evaluation - recommend proceeding with shoulder scop surgery, hold xarelto 48 hours  prior    Arnoldo Lenis, M.D.

## 2020-11-29 NOTE — Telephone Encounter (Signed)
   Primary Cardiologist: Carlyle Dolly, MD  Chart reviewed as part of pre-operative protocol coverage. Given past medical history and time since last visit, based on ACC/AHA guidelines, [name]. would be at acceptable risk for the planned procedure without further cardiovascular testing. He was seen by Dr. Harl Bowie 11/28/20 and doing well.   Per pharmacy review and office protocol, he may hold Xarelto for 1 day prior to the planned procedure. Recommend resuming anticoagulation as soon as possible afterwards.   I will route this recommendation to the requesting party via Epic fax function and remove from pre-op pool.  Please call with questions.  Loel Dubonnet, NP 11/29/2020, 4:13 PM

## 2020-11-29 NOTE — Telephone Encounter (Signed)
Patient with diagnosis of afib on Xarelto for anticoagulation.    Procedure: right shoulder scope  Date of procedure: TBD  CHA2DS2-VASc Score = 6  This indicates a 9.7% annual risk of stroke. The patient's score is based upon: CHF History: No HTN History: Yes Diabetes History: Yes Stroke History: Yes Vascular Disease History: Yes Age Score: 1 Gender Score: 0   Pt underwent afib ablation on 08/24/20. Pt has undergone 3 months of anticoagulation since then.  CrCl >160mL/min Platelet count 178K  Per office protocol, patient can hold Xarelto for 1 day prior to procedure. Recommend resuming anticoagulation as soon as possible afterwards.

## 2020-12-03 ENCOUNTER — Other Ambulatory Visit: Payer: Self-pay

## 2020-12-03 ENCOUNTER — Ambulatory Visit: Payer: 59 | Admitting: Cardiovascular Disease

## 2020-12-03 VITALS — BP 110/60 | HR 63 | Ht 70.0 in | Wt 273.8 lb

## 2020-12-03 DIAGNOSIS — I480 Paroxysmal atrial fibrillation: Secondary | ICD-10-CM | POA: Diagnosis not present

## 2020-12-03 DIAGNOSIS — I483 Typical atrial flutter: Secondary | ICD-10-CM

## 2020-12-03 DIAGNOSIS — G4733 Obstructive sleep apnea (adult) (pediatric): Secondary | ICD-10-CM | POA: Diagnosis not present

## 2020-12-03 DIAGNOSIS — I10 Essential (primary) hypertension: Secondary | ICD-10-CM | POA: Diagnosis not present

## 2020-12-03 DIAGNOSIS — E118 Type 2 diabetes mellitus with unspecified complications: Secondary | ICD-10-CM

## 2020-12-03 DIAGNOSIS — Z6839 Body mass index (BMI) 39.0-39.9, adult: Secondary | ICD-10-CM

## 2020-12-03 NOTE — Patient Instructions (Signed)
Medication Instructions:  The current medical regimen is effective;  continue present plan and medications.  *If you need a refill on your cardiac medications before your next appointment, please call your pharmacy*   Follow-Up: At [facility], you and your health needs are our priority.  As part of our continuing mission to provide you with exceptional heart care, we have created designated Provider Care Teams.  These Care Teams include your primary Cardiologist (physician) and Advanced Practice Providers (APPs -  Physician Assistants and Nurse Practitioners) who all work together to provide you with the care you need, when you need it.  We recommend signing up for the patient portal called "MyChart".  Sign up information is provided on this After Visit Summary.  MyChart is used to connect with patients for Virtual Visits (Telemedicine).  Patients are able to view lab/test results, encounter notes, upcoming appointments, etc.  Non-urgent messages can be sent to your provider as well.   To learn more about what you can do with MyChart, go to [email or web address].    Your next appointment:   6 month(s)  The format for your next appointment:   In Person  Provider:   Thomas Kelly, MD    

## 2020-12-03 NOTE — Progress Notes (Signed)
Cardiology Office Note    Date:  12/09/2020   ID:  Lucas Bartleson., DOB [birth year], MRN [id number]  PCP:  Glenda Chroman, MD  Cardiologist:  Shelva Majestic, MD (sleep): Dr. Carlyle Dolly.   F/U sleep evaluation  History of Present Illness:  Lucas Gil. is a 65 y.o. male who is followed by Dr. Carlyle Dolly for general cardiology care.  He has a history of sleep apnea originally diagnosed in 2017 but has not been therapy for several years.  I saw him for initial sleep evaluation on June 06, 2020.  He presents for follow-up assessment.  Lucas [name] has a history of CAD, hypertension, diabetes mellitus, as well as atrial flutter who had undergone atrial flutter ablation in 2017.  He also recently has developed paroxysmal atrial fibrillation with RVR.  Reportedly, in 2017 he had undergone a sleep study which was interpreted by Dr. Merlene Laughter in [place].  I was able to obtain a copy of that report from April 20, 2016 which showed mild overall sleep apnea with an AHI of 10.9; however, sleep apnea was very severe during REM sleep with an AHI of 48.2.  He had significant oxygen desaturation to a nadir of 75%.  There was moderate snoring.  He does not recall ever seeing Dr. Merlene Laughter in follow-up but was told to initiate AutoPap therapy at a range of 5-15.  The results of the sleep study were never explained to him.  He apparently had received a ResMed air sense 10 CPAP auto machine.  He had gotten his supplies with [employer].  He apparently has not used CPAP in several years.  He has developed episodes of recurrent atrial fibrillation his RPR.  His most recent echo Doppler study has shown normal systolic function with LVH and normal estimated RV systolic pressure.  EF was 60 to 65%.  He was recently evaluated in atrial fibrillation clinic by Roderic Palau and saw me for a new  sleep evaluation and discussion regarding the possibility of reinstitution of CPAP therapy.  On my initial  evaluation in July 2021 Lucas [name] admitted to snoring, daytime sleepiness, frequent awakenings, and was experiencing nocturia several times per night.  His Epworth Sleepiness Scale score was consistent with excessive daytime sleepiness as shown below: Epworth Sleepiness Scale: Situation   Chance of Dozing/Sleeping (0 = never , 1 = slight chance , 2 = moderate chance , 3 = high chance )   sitting and reading 2   watching TV 3   sitting inactive in a public place 2   being a passenger in a motor vehicle for an hour or more 2   lying down in the afternoon 2   sitting and talking to someone 0   sitting quietly after lunch (no alcohol) 1   while stopped for a few minutes in traffic as the driver 1   Total Score  13   During his initial evaluation with me he denied any recurrent  anginal symptoms.  He has been on diltiazem 120 mg, metoprolol succinate 100 mg in addition to isosorbide 120 mg and furosemide 20 mg for his PAF as well as hypertension and CAD.  He is diabetic on Synjardy XR and glimepiride.  He is on atorvastatin 80 mg for hyperlipidemia.  He has been anticoagulated with Xarelto with a CHA2DS2-VASc score is 4.  During his initial evaluation I spent considerable time with him and reviewed his sleep study and discussed adverse cardiovascular consequences associated  with untreated sleep apnea.  I specifically discussed its effects on hypertension, when nocturnal arrhythmias with increased risk for A. fib as well as recurrent atrial fibrillation following cardioversion of sleep apnea remains untreated.  We also discussed potential nocturnal hypoxemic effects including coronary as well cerebrovascular ischemia as well as discussed its effects on inflammation, glucose metabolism, and GERD.  At the time I recommended that he bring his machine to [facility] to obtain the serial number as well as device number so that we will then be able to monitor his treatment.  Lucas [name] has a ResMed air  sense 10 auto unit which has been set at a minimum pressure of 5 and maximum pressure at 15 cm.  His set up date was June 10, 2016.  I obtained a download from November 03, 2020 through December 02, 2020.  He only used CPAP for 1 day with an average use at 3 hours and 18 minutes.  However when used, AHI was 0.6.  An Epworth Sleepiness Scale score was recalculated in the office today and this also endorsed at 13 consistent with residual daytime sleepiness.  Since I initially saw him, he underwent an atrial fibrillation ablation procedure by Dr. Curt Bears on August 24, 2020.  He has recently seen Dr. Carlyle Dolly for his primary cardiology care.  He presents for sleep reevaluation   Past Medical History:  Diagnosis Date  . Arthritis   . Bilateral carpal tunnel syndrome 08/16/2019  . CAD (coronary artery disease)    a. LHC on 01/29/16 which revealed significant apical LAD stenosis, best treated medically. There was moderate disease in the D2 and mid nondominant RCA.[name] No PCI performed b. 03/2018: repeat cath showing regression of his LAD stenosis now being at 50% with 60% mid RCA stenosis, 45% ostial LPDA stenosis, and 45% ostial second diagonal stenosis.  . Cataract   . Diabetes mellitus    type 2  . GERD (gastroesophageal reflux disease)   . Heart disease   . Hyperlipidemia   . Hypertension   . Obesity   . PAF (paroxysmal atrial fibrillation) ([place]) 01/2016  . Sleep apnea    has CPAP machine but not wear  . Tubular adenoma of colon 08/2012  . 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 ANKLE SURGERY Left 2012   tendon repair  . ATRIAL FIBRILLATION ABLATION [name] 08/24/2020   Procedure: ATRIAL FIBRILLATION ABLATION;  Surgeon: Constance Haw, MD;  Location: [facility];  Service: Cardiovascular;  Laterality: [name];  . CARDIAC CATHETERIZATION [name] 01/29/2016   Procedure: Left Heart Cath and Coronary Angiography;  Surgeon:  [name], MD;  Location: [facility];  Service: Cardiovascular;  Laterality: [name];  . CARPAL TUNNEL RELEASE Right 09/06/2019   Procedure: CARPAL TUNNEL RELEASE;  Surgeon: Daryll Brod, MD;  Location: [facility];  Service: Orthopedics;  Laterality: Right;  . CARPAL TUNNEL RELEASE Left 10/11/2019   Procedure: LEFT CARPAL TUNNEL RELEASE;  Surgeon: Daryll Brod, MD;  Location: [facility];  Service: Orthopedics;  Laterality: Left;  IV REGIONAL FOREARM BLOCK  . CATARACT EXTRACTION W/PHACO  11/11/2012   Procedure: CATARACT EXTRACTION PHACO AND INTRAOCULAR LENS PLACEMENT (IOC);  Surgeon: [name], MD;  Location: [facility];  Service: Ophthalmology;  Laterality: Right;  CDE:  5.56  . CATARACT EXTRACTION W/PHACO Left 12/15/2013   Procedure: CATARACT EXTRACTION PHACO AND INTRAOCULAR LENS PLACEMENT (IOC);  Surgeon: Levada Dy  Geoffry Paradise, MD;  Location: [facility];  Service: Ophthalmology;  Laterality: Left;  CDE:13.36  . CHOLECYSTECTOMY    . CHONDROPLASTY Right 05/28/2017   Procedure: RIGHT KNEE CHONDROPLASTY;  Surgeon: [name], MD;  Location: [facility];  Service: Orthopedics;  Laterality: Right;  . COLONOSCOPY    . CYSTOSCOPY W/ URETERAL STENT PLACEMENT     right  . ELECTROPHYSIOLOGIC STUDY [name] 06/02/2016   Procedure: A-Flutter Ablation;  Surgeon: [name], MD;  Location: [facility];  Service: Cardiovascular;  Laterality: [name];  . KNEE ARTHROSCOPY WITH MEDIAL MENISECTOMY Right 05/28/2017   Procedure: KNEE ARTHROSCOPY WITH MEDIAL MENISECTOMY WITH EXTENSIVE SYNOVECTOMY;  Surgeon: [name], MD;  Location: [facility];  Service: Orthopedics;  Laterality: Right;  . LEFT HEART CATH AND CORONARY ANGIOGRAPHY [name] 03/30/2018   Procedure: LEFT HEART CATH AND CORONARY ANGIOGRAPHY;  Surgeon: Leonie Man, MD;  Location: [facility];  Service: Cardiovascular;  Laterality: [name];  . LEFT HEART CATH AND CORONARY ANGIOGRAPHY [name]  07/27/2020   Procedure: LEFT HEART CATH AND CORONARY ANGIOGRAPHY;  Surgeon: Sherren Mocha, MD;  Location: [facility];  Service: Cardiovascular;  Laterality: [name];  . left knee sugery Left    Arthroscopy  . NASAL SINUS SURGERY    . POLYPECTOMY    . TEE WITHOUT CARDIOVERSION [name] 08/23/2020   Procedure: TRANSESOPHAGEAL ECHOCARDIOGRAM (TEE);  Surgeon: Acie Fredrickson Wonda Cheng, MD;  Location: [facility];  Service: Cardiovascular;  Laterality: [name];  . TONSILLECTOMY      Current Medications: Outpatient Medications Prior to Visit  Medication Sig Dispense Refill  . acetaminophen (TYLENOL) 325 MG tablet Take 650 mg by mouth every 6 (six) hours as needed for moderate pain or headache.     [name] atorvastatin (LIPITOR) 80 MG tablet TAKE 1 TABLET (80 MG TOTAL) BY MOUTH DAILY AT 6 PM. 90 tablet 1  . diltiazem (CARDIZEM CD) 240 MG 24 hr capsule Take 1 capsule (240 mg total) by mouth every evening. 90 capsule 1  . furosemide (LASIX) 20 MG tablet Take 20 mg by mouth daily as needed for fluid.     [name] gabapentin (NEURONTIN) 100 MG capsule Take 300 mg by mouth 3 (three) times daily as needed.    [name] glimepiride (AMARYL) 4 MG tablet Take 4 mg by mouth 2 (two) times daily.     [name] HYDROcodone-acetaminophen (NORCO) 5-325 MG tablet Take 1 tablet by mouth every 6 (six) hours as needed. 20 tablet 0  . isosorbide mononitrate (IMDUR) 120 MG 24 hr tablet TAKE 1 TABLET BY MOUTH EVERY DAY 90 tablet 1  . metoprolol succinate (TOPROL-XL) 100 MG 24 hr tablet TAKE 1 AND 1/2 TABLETS BY MOUTH DAILY WITH OR IMMEDIATELY FOLLOWING A MEAL 135 tablet 3  . MULTAQ 400 MG tablet TAKE 1 TABLET (400 MG TOTAL) BY MOUTH 2 (TWO) TIMES DAILY WITH A MEAL. 60 tablet 11  . nitroGLYCERIN (NITROSTAT) 0.4 MG SL tablet Place 1 tablet (0.4 mg total) under the tongue every 5 (five) minutes as needed for chest pain. 25 tablet 3  . OZEMPIC, 0.25 OR 0.5 MG/DOSE, 2 MG/1.5ML SOPN Inject 0.5 mg into the skin every Saturday.     . pantoprazole (PROTONIX) 40 MG  tablet Take 40 mg by mouth daily before breakfast.     . SYNJARDY XR 12.03-999 MG TB24 Take 1 tablet by mouth in the morning and at bedtime.    [name] 20 MG TABS tablet TAKE 1 TABLET (20 MG  TOTAL) BY MOUTH DAILY WITH SUPPER. 30 tablet 3   Facility-Administered Medications Prior to Visit  Medication Dose Route Frequency Provider Last Rate Last Admin  . 0.9 %  sodium chloride infusion   Intravenous Continuous PRN Trinna Post., CRNA   New Bag at 08/31/20 0745     Allergies:   Bee venom and Codeine   Social History   Socioeconomic History  . Marital status: Married    Spouse name: Not on file  . Number of children: Not on file  . Years of education: 22  . Highest education level: Not on file  Occupational History  . Not on file  Tobacco Use  . Smoking status: Never Smoker  . Smokeless tobacco: Never Used  Vaping Use  . Vaping Use: Never used  Substance and Sexual Activity  . Alcohol use: Yes    Alcohol/week: 0.0 standard drinks    Comment: rare occasion  . Drug use: No  . Sexual activity: Yes    Birth control/protection: None  Other Topics Concern  . Not on file  Social History Narrative   Right handed   Caffeine~ 1-2 cups per day   Lives at home with wife Shirlean Mylar    Social Determinants of Health   Financial Resource Strain: Not on file  Food Insecurity: Not on file  Transportation Needs: Not on file  Physical Activity: Not on file  Stress: Not on file  Social Connections: Not on file    Socially he was born in [place].  He works for [employer] currently on [employer].  He is married for 35 years.  There are no children.  Family History:  The patient's family history includes Aneurysm in his maternal grandmother and mother; Cirrhosis in his father; Diabetes in his sister; Heart attack in his paternal grandmother and sister; Heart murmur in his mother; Hypertension in his mother; Stroke in his maternal grandmother and sister.  Both parents are deceased,  mother at age 46, father age 82.  He has 4 sisters.  ROS General: Negative; No fevers, chills, or night sweats;  HEENT: Negative; No changes in vision or hearing, sinus congestion, difficulty swallowing Pulmonary: Negative; No cough, wheezing, shortness of breath, hemoptysis Cardiovascular: Positive for CAD, hypertension, atrial flutter ablation, and PAF GI: Negative; No nausea, vomiting, diarrhea, or abdominal pain GU: Negative; No dysuria, hematuria, or difficulty voiding Musculoskeletal: Negative; no myalgias, joint pain, or weakness Hematologic/Oncology: Negative; no easy bruising, bleeding Endocrine: Negative; no heat/cold intolerance; no diabetes Neuro: Negative; no changes in balance, headaches Skin: Negative; No rashes or skin lesions Psychiatric: Negative; No behavioral problems, depression Sleep: Positive for OSA with snoring,  daytime sleepiness, hypersomnolence; no bruxism, restless legs, hypnogognic hallucinations, no cataplexy Other comprehensive 14 point system review is negative.   PHYSICAL EXAM:   VS:  BP 110/60 (BP Location: Left Arm, Patient Position: Sitting)   Pulse 63   Ht 5' 10"  (1.778 m)   Wt 273 lb 12.8 oz (124.2 kg)   SpO2 97%   BMI 39.29 kg/m     Repeat blood pressure by me was 114/64  Wt Readings from Last 3 Encounters:  12/06/20 273 lb (123.8 kg)  12/03/20 273 lb 12.8 oz (124.2 kg)  11/28/20 276 lb 3.2 oz (125.3 kg)    General: Alert, oriented, no distress.  Moderately obese Skin: normal turgor, no rashes, warm and dry HEENT: Normocephalic, atraumatic. Pupils equal round and reactive to light; sclera anicteric; extraocular muscles intact;  Nose without nasal septal  hypertrophy Mouth/Parynx benign; Mallinpatti scale 3/4 Neck: No JVD, no carotid bruits; normal carotid upstroke Lungs: clear to ausculatation and percussion; no wheezing or rales Chest wall: without tenderness to palpitation Heart: PMI not displaced, RRR, s1 s2 normal, 1/6 systolic  murmur, no diastolic murmur, no rubs, gallops, thrills, or heaves Abdomen: soft, nontender; no hepatosplenomehaly, BS+; abdominal aorta nontender and not dilated by palpation. Back: no CVA tenderness Pulses 2+ Musculoskeletal: full range of motion, normal strength, no joint deformities Extremities: no clubbing cyanosis or edema, Homan's sign negative  Neurologic: grossly nonfocal; Cranial nerves grossly wnl Psychologic: Normal mood and affect   Studies/Labs Reviewed:   ECG (independently read by me): Sinus rhythm,  PAC, QTc 440 msec  06/07/2020 ECG (independently read by me): Sinus rhythm at 69 bpm with PACs, QTc interval 431 ms.  Recent Labs: BMP Latest Ref Rng & Units 08/22/2020 07/23/2020 07/04/2020  Glucose 65 - 99 mg/dL 106(H) 153(H) 232(H)  BUN 8 - 27 mg/dL 20 15 15   Creatinine 0.76 - 1.27 mg/dL 0.85 0.80 0.98  BUN/Creat Ratio 10 - 24 24 19  -  Sodium 134 - 144 mmol/L 139 137 137  Potassium 3.5 - 5.2 mmol/L 3.9 4.4 4.4  Chloride 96 - 106 mmol/L 105 102 103  CO2 20 - 29 mmol/L 26 24 24   Calcium 8.6 - 10.2 mg/dL 9.0 9.5 8.9     Hepatic Function Latest Ref Rng & Units 05/09/2020 05/30/2016 02/12/2016  Total Protein 6.5 - 8.1 g/dL 6.9 6.6 6.8  Albumin 3.5 - 5.0 g/dL 3.9 3.6 3.7  AST 15 - 41 U/L 32 30 30  ALT 0 - 44 U/L 35 34 31  Alk Phosphatase 38 - 126 U/L 125 84 90  Total Bilirubin 0.3 - 1.2 mg/dL 1.0 1.2 1.1  Bilirubin, Direct 0.0 - 0.2 mg/dL 0.2 0.2 -    CBC Latest Ref Rng & Units 08/22/2020 07/23/2020 07/04/2020  WBC 3.4 - 10.8 x10E3/uL 10.1 12.2(H) 10.7(H)  Hemoglobin 13.0 - 17.7 g/dL 15.1 15.1 15.9  Hematocrit 37.5 - 51.0 % 44.1 44.5 47.4  Platelets 150 - 450 x10E3/uL 178 182 201   Lab Results  Component Value Date   MCV 91 08/22/2020   MCV 91 07/23/2020   MCV 92.2 07/04/2020   Lab Results  Component Value Date   TSH 4.434 07/04/2020   Lab Results  Component Value Date   HGBA1C 8.0 (H) 05/09/2020     BNP    Component Value Date/Time   BNP 148.0 (H)  05/09/2020 0727    ProBNP No results found for: PROBNP   Lipid Panel     Component Value Date/Time   CHOL 195 01/29/2016 0109   TRIG 133 01/29/2016 0109   HDL 39 (L) 01/29/2016 0109   CHOLHDL 5.0 01/29/2016 0109   VLDL 27 01/29/2016 0109   LDLCALC 129 (H) 01/29/2016 0109     RADIOLOGY: No results found.   Additional studies/ records that were reviewed today include:  I personally reviewed the diagnostic polysomnogram dated 12/22/2015 and interpretion by Dr. Merlene Laughter. Records of Dr. Harl Bowie, and AF clinic were reviewed  A download was obtained from November 03, 2020 through December 02, 2020.  ASSESSMENT:    1. OSA (obstructive sleep apnea)   2. Essential hypertension   3. Paroxysmal atrial fibrillation (HCC)   4. Typical atrial flutter [facility]): s/p ablation   5. Type 2 diabetes mellitus with complication, without long-term current use of insulin (HCC)   6. Class 2 severe obesity due to excess  calories with serious comorbidity and body mass index (BMI) of 39.0 to 39.9 in adult [facility])     PLAN:  Lucas [name] is a 65 year old gentleman who has significant cardiovascular comorbidities including CAD, hypertension, diabetes mellitus, typical atrial flutter status post ablation, and who has experienced recurrent episodes of paroxysmal atrial fibrillation with RVR.  He is on anticoagulation with his CHA2DS2-VASc score of 4.  He has a history of moderate obesity current BMI is 38.  He had undergone an initial sleep study in 2017 which was interpreted by Dr. Merlene Laughter.  I personally reviewed this today.  He tells me that he never had seen a physician following the sleep study but was just notified of the results of his started on AutoPap therapy.  Upon review of the sleep study, he had mild sleep apnea overall with an AHI of 10.9.  He had severe sleep apnea during REM sleep with an AHI of 48.2 and there was significant oxygen desaturation to a nadir of 75%.  There was moderate snoring.   He apparently had used CPAP for short duration when he got supplies from [employer].  When I saw him for my initial evaluation in July 2021 I spent considerable time with him and reviewed his sleep study and discussed potential adverse cardiovascular consequences associated with untreated sleep apnea.  I discussed its effect on hypertension, nocturnal arrhythmias with increased risk for atrial fibrillation as well as recurrent atrial fibrillation following cardioversion if sleep apnea remains untreated.  We also discussed its potential nocturnal hypoxemic effects including potential coronary ischemia with underlying CAD as well as cerebrovascular ischemia.  I discussed its implications with reference to glucose metabolism as well as GERD.  Since I saw him, he has undergone atrial fibrillation ablation by Dr. Curt Bears.  He also has gone to [employer] in his device has been linked to our office so that I may review current compliance.  Presently, he is noncompliant.  Over the past 30 days he had only used CPAP on one occasion which was the night before his current visit.  When used, AHI was excellent at 0.5 and his 95th percentile pressure was 14.6 centimeters of water.  I am changing his current settings which remotely had been set at a minimum pressure of 5 with maximum to 15 and will change him to a range of 7 to 18 cm of water.  I again stressed the importance of compliance with optimal sleep duration at 7 to 8 hours with CPAP use throughout the nights entirety.  I again discussed the preponderance of REM sleep occurs in the second half of the night and the importance of using treatment until he wakes up in the morning.  At the completion of this evaluation he stated he would significantly try to improve use.  With his BMI of 39.29, we discussed the importance of weight loss and exercise.  I will see him in 6 months for reevaluation or sooner as needed.   Medication Adjustments/Labs and Tests  Ordered: Current medicines are reviewed at length with the patient today.  Concerns regarding medicines are outlined above.  Medication changes, Labs and Tests ordered today are listed in the Patient Instructions below. Patient Instructions  Medication Instructions:  The current medical regimen is effective;  continue present plan and medications.  *If you need a refill on your cardiac medications before your next appointment, please call your pharmacy*   Follow-Up: At [facility], you and your health needs are our priority.  As part of our continuing mission to provide you with exceptional heart care, we have created designated Provider Care Teams.  These Care Teams include your primary Cardiologist (physician) and Advanced Practice Providers (APPs -  Physician Assistants and Nurse Practitioners) who all work together to provide you with the care you need, when you need it.  We recommend signing up for the patient portal called "MyChart".  Sign up information is provided on this After Visit Summary.  MyChart is used to connect with patients for Virtual Visits (Telemedicine).  Patients are able to view lab/test results, encounter notes, upcoming appointments, etc.  Non-urgent messages can be sent to your provider as well.   To learn more about what you can do with MyChart, go to [email or web address].    Your next appointment:   6 month(s)  The format for your next appointment:   In Person  Provider:   Shelva Majestic, MD        Signed, Shelva Majestic, MD  12/09/2020 1:58 PM    [place] [address] [place], [place], [place]  [address] Phone: [phone]

## 2020-12-05 NOTE — Progress Notes (Signed)
Electrophysiology Office Note   Date:  12/06/2020   ID:  Lucas Tukes., DOB 09/20/1956, MRN [id number]  PCP:  Glenda Chroman, MD  Cardiologist: Carlyle Dolly Primary Electrophysiologist:  Savier Trickett Meredith Leeds, MD    Chief Complaint: AF   History of Present Illness: Lucas Grave. is a 65 y.o. male who is being seen today for the evaluation of AF at the request of Jerene Bears B, MD. Presenting today for electrophysiology evaluation.  He has a history significant for coronary artery disease, hypertension, diabetes, typical atrial flutter status post ablation in 2017, and paroxysmal atrial fibrillation.  He was seen in the emergency room 04/10/2020 with chest pain was found to be in and out of atrial fibrillation.  He is now status post AF ablation on 08/24/2020.  Today, denies symptoms of palpitations, chest pain, shortness of breath, orthopnea, PND, lower extremity edema, claudication, dizziness, presyncope, syncope, bleeding, or neurologic sequela. The patient is tolerating medications without difficulties.  He currently feels well.  His main complaint is orthopedic.  He has back problems and shoulder problems.  He has a plan for shoulder surgery upcoming.  After ablation, he had a few short episodes of atrial fibrillation but since then he has done well.   Past Medical History:  Diagnosis Date  . Arthritis   . Bilateral carpal tunnel syndrome 08/16/2019  . CAD (coronary artery disease)    a. LHC on 01/29/16 which revealed significant apical LAD stenosis, best treated medically. There was moderate disease in the D2 and mid nondominant RCA.[name] No PCI performed b. 03/2018: repeat cath showing regression of his LAD stenosis now being at 50% with 60% mid RCA stenosis, 45% ostial LPDA stenosis, and 45% ostial second diagonal stenosis.  . Cataract   . Diabetes mellitus    type 2  . GERD (gastroesophageal reflux disease)   . Heart disease   . Hyperlipidemia   . Hypertension   . Obesity    . PAF (paroxysmal atrial fibrillation) ([place]) 01/2016  . Sleep apnea    has CPAP machine but not wear  . Tubular adenoma of colon 08/2012  . 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 ANKLE SURGERY Left 2012   tendon repair  . ATRIAL FIBRILLATION ABLATION [name] 08/24/2020   Procedure: ATRIAL FIBRILLATION ABLATION;  Surgeon: Constance Haw, MD;  Location: [place] [facility];  Service: Cardiovascular;  Laterality: [name];  . CARDIAC CATHETERIZATION [name] 01/29/2016   Procedure: Left Heart Cath and Coronary Angiography;  Surgeon: [name], MD;  Location: [place] [facility];  Service: Cardiovascular;  Laterality: [name];  . CARPAL TUNNEL RELEASE Right 09/06/2019   Procedure: CARPAL TUNNEL RELEASE;  Surgeon: Daryll Brod, MD;  Location: [place];  Service: Orthopedics;  Laterality: Right;  . CARPAL TUNNEL RELEASE Left 10/11/2019   Procedure: LEFT CARPAL TUNNEL RELEASE;  Surgeon: Daryll Brod, MD;  Location: [place];  Service: Orthopedics;  Laterality: Left;  IV REGIONAL FOREARM BLOCK  . CATARACT EXTRACTION W/PHACO  11/11/2012   Procedure: CATARACT EXTRACTION PHACO AND INTRAOCULAR LENS PLACEMENT (IOC);  Surgeon: [name], MD;  Location: [facility];  Service: Ophthalmology;  Laterality: Right;  CDE:  5.56  . CATARACT EXTRACTION W/PHACO Left 12/15/2013   Procedure: CATARACT EXTRACTION PHACO AND INTRAOCULAR LENS PLACEMENT (IOC);  Surgeon: [name], MD;  Location: [facility];  Service: Ophthalmology;  Laterality: Left;  CDE:13.36  .  CHOLECYSTECTOMY    . CHONDROPLASTY Right 05/28/2017   Procedure: RIGHT KNEE CHONDROPLASTY;  Surgeon: [name], MD;  Location: [place];  Service: Orthopedics;  Laterality: Right;  . COLONOSCOPY    . CYSTOSCOPY W/ URETERAL STENT PLACEMENT     right  . ELECTROPHYSIOLOGIC STUDY [name] 06/02/2016   Procedure: A-Flutter Ablation;  Surgeon: Arella Blinder Meredith Leeds, MD;   Location: [place] [facility];  Service: Cardiovascular;  Laterality: [name];  . KNEE ARTHROSCOPY WITH MEDIAL MENISECTOMY Right 05/28/2017   Procedure: KNEE ARTHROSCOPY WITH MEDIAL MENISECTOMY WITH EXTENSIVE SYNOVECTOMY;  Surgeon: [name], MD;  Location: [place];  Service: Orthopedics;  Laterality: Right;  . LEFT HEART CATH AND CORONARY ANGIOGRAPHY [name] 03/30/2018   Procedure: LEFT HEART CATH AND CORONARY ANGIOGRAPHY;  Surgeon: Leonie Man, MD;  Location: [place] [facility];  Service: Cardiovascular;  Laterality: [name];  . LEFT HEART CATH AND CORONARY ANGIOGRAPHY [name] 07/27/2020   Procedure: LEFT HEART CATH AND CORONARY ANGIOGRAPHY;  Surgeon: Sherren Mocha, MD;  Location: [place] [facility];  Service: Cardiovascular;  Laterality: [name];  . left knee sugery Left    Arthroscopy  . NASAL SINUS SURGERY    . POLYPECTOMY    . TEE WITHOUT CARDIOVERSION [name] 08/23/2020   Procedure: TRANSESOPHAGEAL ECHOCARDIOGRAM (TEE);  Surgeon: Acie Fredrickson Wonda Cheng, MD;  Location: [facility];  Service: Cardiovascular;  Laterality: [name];  . TONSILLECTOMY       Current Outpatient Medications  Medication Sig Dispense Refill  . acetaminophen (TYLENOL) 325 MG tablet Take 650 mg by mouth every 6 (six) hours as needed for moderate pain or headache.     [name] atorvastatin (LIPITOR) 80 MG tablet TAKE 1 TABLET (80 MG TOTAL) BY MOUTH DAILY AT 6 PM. 90 tablet 1  . diltiazem (CARDIZEM CD) 240 MG 24 hr capsule Take 1 capsule (240 mg total) by mouth every evening. 90 capsule 1  . furosemide (LASIX) 20 MG tablet Take 20 mg by mouth daily as needed for fluid.     [name] gabapentin (NEURONTIN) 100 MG capsule Take 300 mg by mouth 3 (three) times daily as needed.    [name] glimepiride (AMARYL) 4 MG tablet Take 4 mg by mouth 2 (two) times daily.     [name] HYDROcodone-acetaminophen (NORCO) 5-325 MG tablet Take 1 tablet by mouth every 6 (six) hours as needed. 20 tablet 0  . isosorbide mononitrate (IMDUR) 120 MG 24 hr tablet TAKE 1 TABLET  BY MOUTH EVERY DAY 90 tablet 1  . metoprolol succinate (TOPROL-XL) 100 MG 24 hr tablet TAKE 1 AND 1/2 TABLETS BY MOUTH DAILY WITH OR IMMEDIATELY FOLLOWING A MEAL 135 tablet 3  . MULTAQ 400 MG tablet TAKE 1 TABLET (400 MG TOTAL) BY MOUTH 2 (TWO) TIMES DAILY WITH A MEAL. 60 tablet 11  . nitroGLYCERIN (NITROSTAT) 0.4 MG SL tablet Place 1 tablet (0.4 mg total) under the tongue every 5 (five) minutes as needed for chest pain. 25 tablet 3  . OZEMPIC, 0.25 OR 0.5 MG/DOSE, 2 MG/1.5ML SOPN Inject 0.5 mg into the skin every Saturday.     . pantoprazole (PROTONIX) 40 MG tablet Take 40 mg by mouth daily before breakfast.     . SYNJARDY XR 12.03-999 MG TB24 Take 1 tablet by mouth in the morning and at bedtime.    [name] 20 MG TABS tablet TAKE 1 TABLET (20 MG TOTAL) BY MOUTH DAILY WITH SUPPER. 30 tablet 3   No current facility-administered medications for this visit.  Facility-Administered Medications Ordered in Other Visits  Medication Dose Route Frequency Provider Last Rate Last Admin  . 0.9 %  sodium chloride infusion   Intravenous Continuous PRN Trinna Post., CRNA   New Bag at 08/31/20 0745    Allergies:   Bee venom and Codeine   Social History:  The patient  reports that he has never smoked. He has never used smokeless tobacco. He reports current alcohol use. He reports that he does not use drugs.   Family History:  The patient's family history includes Aneurysm in his maternal grandmother and mother; Cirrhosis in his father; Diabetes in his sister; Heart attack in his paternal grandmother and sister; Heart murmur in his mother; Hypertension in his mother; Stroke in his maternal grandmother and sister.    ROS:  Please see the history of present illness.   Otherwise, review of systems is positive for none.   All other systems are reviewed and negative.   PHYSICAL EXAM: VS:  BP 116/68   Pulse 63   Ht 5\' 10"  (1.778 m)   Wt 273 lb (123.8 kg)   SpO2 94%   BMI 39.17 kg/m  , BMI Body mass  index is 39.17 kg/m. GEN: Well nourished, well developed, in no acute distress  HEENT: normal  Neck: no JVD, carotid bruits, or masses Cardiac: RRR; no murmurs, rubs, or gallops,no edema  Respiratory:  clear to auscultation bilaterally, normal work of breathing GI: soft, nontender, nondistended, + BS MS: no deformity or atrophy  Skin: warm and dry Neuro:  Strength and sensation are intact Psych: euthymic mood, full affect  EKG:  EKG is ordered today. Personal review of the ekg ordered shows sinus rhythm, rate 63  Recent Labs: 05/09/2020: ALT 35; B Natriuretic Peptide 148.0 07/04/2020: Magnesium 1.7; TSH 4.434 08/22/2020: BUN 20; Creatinine, Ser 0.85; Hemoglobin 15.1; Platelets 178; Potassium 3.9; Sodium 139    Lipid Panel     Component Value Date/Time   CHOL 195 01/29/2016 0109   TRIG 133 01/29/2016 0109   HDL 39 (L) 01/29/2016 0109   CHOLHDL 5.0 01/29/2016 0109   VLDL 27 01/29/2016 0109   LDLCALC 129 (H) 01/29/2016 0109     Wt Readings from Last 3 Encounters:  12/06/20 273 lb (123.8 kg)  12/03/20 273 lb 12.8 oz (124.2 kg)  11/28/20 276 lb 3.2 oz (125.3 kg)      Other studies Reviewed: Additional studies/ records that were reviewed today include: TTE 05/09/20  Review of the above records today demonstrates:  1. Left ventricular ejection fraction, by estimation, is 60 to 65%. The  left ventricle has normal function. The left ventricle has no regional  wall motion abnormalities. There is moderate left ventricular hypertrophy.  Left ventricular diastolic  parameters are indeterminate.  2. Right ventricular systolic function is normal. The right ventricular  size is normal. There is normal pulmonary artery systolic pressure.  3. The mitral valve is normal in structure. No evidence of mitral valve  regurgitation. No evidence of mitral stenosis.  4. The aortic valve is tricuspid. Aortic valve regurgitation is not  visualized. No aortic stenosis is present.  5. The  inferior vena cava is normal in size with greater than 50%  respiratory variability, suggesting right atrial pressure of 3 mmHg.    ASSESSMENT AND PLAN:  1.  Paroxysmal atrial fibrillation: CHA2DS2-VASc of 4 on Xarelto and Multaq.  High risk medication monitoring.  Is now status post AF ablation 11/25/2019.  He is fortunately remained in sinus rhythm.  We Burleigh Brockmann continue with current medications.  No changes.  2.  Coronary artery disease: No current chest pain  3.  Obstructive sleep apnea: CPAP compliance encouraged.    Current medicines are reviewed at length with the patient today.   The patient does not have concerns regarding his medicines.  The following changes were made today: None  Labs/ tests ordered today include:  Orders Placed This Encounter  Procedures  . EKG 12-Lead     Disposition:   FU with [facility] 3 months  Signed, Tallia Moehring Meredith Leeds, MD  12/06/2020 10:42 AM     [facility] [address] [place] [address] [phone] (office) [phone] (fax)

## 2020-12-06 ENCOUNTER — Ambulatory Visit: Payer: 59 | Admitting: Cardiology

## 2020-12-06 ENCOUNTER — Encounter: Payer: Self-pay | Admitting: Cardiology

## 2020-12-06 ENCOUNTER — Other Ambulatory Visit: Payer: Self-pay

## 2020-12-06 VITALS — BP 116/68 | HR 63 | Ht 70.0 in | Wt 273.0 lb

## 2020-12-06 DIAGNOSIS — I4819 Other persistent atrial fibrillation: Secondary | ICD-10-CM | POA: Diagnosis not present

## 2020-12-06 NOTE — Patient Instructions (Signed)
Medication Instructions:  Your physician recommends that you continue on your current medications as directed. Please refer to the Current Medication list given to you today.  *If you need a refill on your cardiac medications before your next appointment, please call your pharmacy*   Lab Work: None ordered If you have labs (blood work) drawn today and your tests are completely normal, you will receive your results only by: . MyChart Message (if you have MyChart) OR . A paper copy in the mail If you have any lab test that is abnormal or we need to change your treatment, we will call you to review the results.   Testing/Procedures: None ordered   Follow-Up: At [facility], you and your health needs are our priority.  As part of our continuing mission to provide you with exceptional heart care, we have created designated Provider Care Teams.  These Care Teams include your primary Cardiologist (physician) and Advanced Practice Providers (APPs -  Physician Assistants and Nurse Practitioners) who all work together to provide you with the care you need, when you need it.  We recommend signing up for the patient portal called "MyChart".  Sign up information is provided on this After Visit Summary.  MyChart is used to connect with patients for Virtual Visits (Telemedicine).  Patients are able to view lab/test results, encounter notes, upcoming appointments, etc.  Non-urgent messages can be sent to your provider as well.   To learn more about what you can do with MyChart, go to [email or web address].    Your next appointment:   3 month(s)  The format for your next appointment:   In Person  Provider:   Will Camnitz, MD   Thank you for choosing [facility]!!   Jacklyne Baik, RN [phone]    Other Instructions    

## 2020-12-09 ENCOUNTER — Encounter: Payer: Self-pay | Admitting: Cardiovascular Disease

## 2020-12-20 ENCOUNTER — Encounter (HOSPITAL_BASED_OUTPATIENT_CLINIC_OR_DEPARTMENT_OTHER): Payer: Self-pay | Admitting: Orthopaedic Surgery

## 2020-12-20 ENCOUNTER — Other Ambulatory Visit: Payer: Self-pay

## 2020-12-20 NOTE — Progress Notes (Signed)
Voicemail message left for Sherri the surgery scheduler for Dr. Griffin Basil requesting cards clearance for this pt and his procedure scheduled for 12/27/20.

## 2020-12-21 ENCOUNTER — Encounter (HOSPITAL_BASED_OUTPATIENT_CLINIC_OR_DEPARTMENT_OTHER)
Admission: RE | Admit: 2020-12-21 | Discharge: 2020-12-21 | Disposition: A | Discharge status: Home / Self Care | Payer: 59 | Source: Ambulatory Visit | Attending: Orthopaedic Surgery | Admitting: Orthopaedic Surgery

## 2020-12-21 DIAGNOSIS — Z01812 Encounter for preprocedural laboratory examination: Secondary | ICD-10-CM | POA: Insufficient documentation

## 2020-12-21 LAB — BASIC METABOLIC PANEL
Anion gap: 12 (ref 5–15)
BUN: 18 mg/dL (ref 8–23)
CO2: 22 mmol/L (ref 22–32)
Calcium: 8.7 mg/dL — ABNORMAL LOW (ref 8.9–10.3)
Chloride: 104 mmol/L (ref 98–111)
Creatinine, Ser: 1.09 mg/dL (ref 0.61–1.24)
GFR, Estimated: >60 mL/min (ref >60)
Glucose, Bld: 213 mg/dL — ABNORMAL HIGH (ref 70–99)
Potassium: 4.6 mmol/L (ref 3.5–5.1)
Sodium: 138 mmol/L (ref 135–145)

## 2020-12-21 NOTE — Progress Notes (Signed)

## 2020-12-24 ENCOUNTER — Other Ambulatory Visit (HOSPITAL_COMMUNITY)
Admission: RE | Admit: 2020-12-24 | Discharge: 2020-12-24 | Disposition: A | Discharge status: Home / Self Care | Payer: 59 | Source: Ambulatory Visit | Attending: Orthopaedic Surgery | Admitting: Orthopaedic Surgery

## 2020-12-24 DIAGNOSIS — Z20822 Contact with and (suspected) exposure to covid-19: Secondary | ICD-10-CM | POA: Insufficient documentation

## 2020-12-24 DIAGNOSIS — Z01812 Encounter for preprocedural laboratory examination: Secondary | ICD-10-CM | POA: Insufficient documentation

## 2020-12-25 LAB — SARS CORONAVIRUS 2 (TAT 6-24 HRS): SARS Coronavirus 2: NEGATIVE

## 2020-12-26 ENCOUNTER — Ambulatory Visit: Payer: 59 | Admitting: Cardiology

## 2020-12-26 NOTE — H&P (Signed)
PREOPERATIVE H&P  Chief Complaint: RIGHT SHOULDER ARTICULAR CARTILAGE DISORDERS, PRIMARY OSTEOARTHRITIS, IMPINGEMENT SYNDROME, BICIPITAL TENDINITIS  HPI: Lucas [name]. is a 65 y.o. male who is scheduled for, Procedure(s): RIGHT SHOULDER ARTHROSCOPY WITH DEBRIDEMENT, DISTAL CLAVICAL EXCISION, ACROMIOPLASTY, BICEP TENODESIS.   Patient has a past medical history significant for atrial fibrillation with recent ablation, HTN, HLD, GERD, DM type 2, CAD.   He is a 65 year old whom I have been following for some time. He has had pain in the right shoulder for months. He had a biceps injection with Dr. Thedore Mins and had some improvement in his symptoms. However, he was unhappy with the function of his right shoulder as he had pain with certain daily activities such as driving.   His symptoms are rated as moderate to severe, and have been worsening.  This is significantly impairing activities of daily living.    Please see clinic note for further details on this patient's care.    He has elected for surgical management.   Past Medical History:  Diagnosis Date  . Arthritis   . Bilateral carpal tunnel syndrome 08/16/2019  . CAD (coronary artery disease)    a. LHC on 01/29/16 which revealed significant apical LAD stenosis, best treated medically. There was moderate disease in the D2 and mid nondominant RCA.[name] No PCI performed b. 03/2018: repeat cath showing regression of his LAD stenosis now being at 50% with 60% mid RCA stenosis, 45% ostial LPDA stenosis, and 45% ostial second diagonal stenosis.  . Cataract   . Chronic back pain   . Diabetes mellitus    type 2  . GERD (gastroesophageal reflux disease)   . Heart disease   . Hyperlipidemia   . Hypertension   . Obesity   . PAF (paroxysmal atrial fibrillation) ([place]) 01/2016  . Sleep apnea    has CPAP machine but not wear  . Tubular adenoma of colon 08/2012  . Ulnar neuropathy at elbow 08/16/2019   Bilateral   Past Surgical History:   Procedure Laterality Date  . ANKLE SURGERY Left 2012   tendon repair  . ATRIAL FIBRILLATION ABLATION [name] 08/24/2020   Procedure: ATRIAL FIBRILLATION ABLATION;  Surgeon: Constance Haw, MD;  Location: [place] [facility];  Service: Cardiovascular;  Laterality: [name];  . CARDIAC CATHETERIZATION [name] 01/29/2016   Procedure: Left Heart Cath and Coronary Angiography;  Surgeon: [name], MD;  Location: [place] [facility];  Service: Cardiovascular;  Laterality: [name];  . CARPAL TUNNEL RELEASE Right 09/06/2019   Procedure: CARPAL TUNNEL RELEASE;  Surgeon: Daryll Brod, MD;  Location: [place];  Service: Orthopedics;  Laterality: Right;  . CARPAL TUNNEL RELEASE Left 10/11/2019   Procedure: LEFT CARPAL TUNNEL RELEASE;  Surgeon: Daryll Brod, MD;  Location: [place];  Service: Orthopedics;  Laterality: Left;  IV REGIONAL FOREARM BLOCK  . CATARACT EXTRACTION W/PHACO  11/11/2012   Procedure: CATARACT EXTRACTION PHACO AND INTRAOCULAR LENS PLACEMENT (IOC);  Surgeon: [name], MD;  [facility];  Service: Ophthalmology;  Laterality: Right;  CDE:  5.56  . CATARACT EXTRACTION W/PHACO Left 12/15/2013   Procedure: CATARACT EXTRACTION PHACO AND INTRAOCULAR LENS PLACEMENT (IOC);  Surgeon: [name], MD;  [facility];  Service: Ophthalmology;  Laterality: Left;  CDE:13.36  . CHOLECYSTECTOMY    . CHONDROPLASTY Right 05/28/2017   Procedure: RIGHT KNEE CHONDROPLASTY;  Surgeon: [name], MD;  Location: [place];  Service: Orthopedics;  Laterality: Right;  . COLONOSCOPY    .  CYSTOSCOPY W/ URETERAL STENT PLACEMENT     right  . ELECTROPHYSIOLOGIC STUDY [name] 06/02/2016   Procedure: A-Flutter Ablation;  Surgeon: [name], MD;  Location: [place] [facility];  Service: Cardiovascular;  Laterality: [name];  . KNEE ARTHROSCOPY WITH MEDIAL MENISECTOMY Right 05/28/2017   Procedure: KNEE ARTHROSCOPY WITH MEDIAL MENISECTOMY WITH EXTENSIVE SYNOVECTOMY;   Surgeon: [name], MD;  Location: [place];  Service: Orthopedics;  Laterality: Right;  . LEFT HEART CATH AND CORONARY ANGIOGRAPHY [name] 03/30/2018   Procedure: LEFT HEART CATH AND CORONARY ANGIOGRAPHY;  Surgeon: Leonie Man, MD;  Location: [place] [facility];  Service: Cardiovascular;  Laterality: [name];  . LEFT HEART CATH AND CORONARY ANGIOGRAPHY [name] 07/27/2020   Procedure: LEFT HEART CATH AND CORONARY ANGIOGRAPHY;  Surgeon: Sherren Mocha, MD;  Location: [place] [facility];  Service: Cardiovascular;  Laterality: [name];  . left knee sugery Left    Arthroscopy  . NASAL SINUS SURGERY    . POLYPECTOMY    . TEE WITHOUT CARDIOVERSION [name] 08/23/2020   Procedure: TRANSESOPHAGEAL ECHOCARDIOGRAM (TEE);  Surgeon: Acie Fredrickson Wonda Cheng, MD;  Location: [facility]COPY;  Service: Cardiovascular;  Laterality: [name];  . TONSILLECTOMY     Social History   Socioeconomic History  . Marital status: Married    Spouse name: Not on file  . Number of children: Not on file  . Years of education: 58  . Highest education level: Not on file  Occupational History  . Not on file  Tobacco Use  . Smoking status: Never Smoker  . Smokeless tobacco: Never Used  Vaping Use  . Vaping Use: Never used  Substance and Sexual Activity  . Alcohol use: Yes    Alcohol/week: 0.0 standard drinks    Comment: rare occasion  . Drug use: No  . Sexual activity: Yes    Birth control/protection: None  Other Topics Concern  . Not on file  Social History Narrative   Right handed   Caffeine~ 1-2 cups per day   Lives at home with wife Shirlean Mylar    Social Determinants of Health   Financial Resource Strain: Not on file  Food Insecurity: Not on file  Transportation Needs: Not on file  Physical Activity: Not on file  Stress: Not on file  Social Connections: Not on file   Family History  Problem Relation Age of Onset  . Heart murmur Mother   . Aneurysm Mother   . Hypertension Mother   . Cirrhosis Father   .  Heart attack Sister   . Diabetes Sister   . Aneurysm Maternal Grandmother   . Stroke Maternal Grandmother   . Heart attack Paternal Grandmother   . Stroke Sister   . Colon cancer Neg Hx   . Rectal cancer Neg Hx   . Stomach cancer Neg Hx    Allergies  Allergen Reactions  . Bee Venom Swelling  . Codeine Nausea Only   Prior to Admission medications   Medication Sig Start Date End Date Taking? Authorizing Provider  atorvastatin (LIPITOR) 80 MG tablet TAKE 1 TABLET (80 MG TOTAL) BY MOUTH DAILY AT 6 PM. 11/26/20  Yes Branch, Alphonse Guild, MD  diltiazem (CARDIZEM CD) 240 MG 24 hr capsule Take 1 capsule (240 mg total) by mouth every evening. 09/28/20 12/27/20 Yes Fenton, Clint R, PA  gabapentin (NEURONTIN) 100 MG capsule Take 300 mg by mouth 3 (three) times daily as needed. 11/24/20  Yes [provider]  glimepiride (AMARYL) 4 MG tablet Take 4  mg by mouth 2 (two) times daily.  07/25/12  Yes [provider]  HYDROcodone-acetaminophen (NORCO) 5-325 MG tablet Take 1 tablet by mouth every 6 (six) hours as needed. 10/11/19  Yes Daryll Brod, MD  isosorbide mononitrate (IMDUR) 120 MG 24 hr tablet TAKE 1 TABLET BY MOUTH EVERY DAY 11/26/20  Yes Branch, Alphonse Guild, MD  metoprolol succinate (TOPROL-XL) 100 MG 24 hr tablet TAKE 1 AND 1/2 TABLETS BY MOUTH DAILY WITH OR IMMEDIATELY FOLLOWING A MEAL 07/30/20  Yes Camnitz, Will Hassell Done, MD  MULTAQ 400 MG tablet TAKE 1 TABLET (400 MG TOTAL) BY MOUTH 2 (TWO) TIMES DAILY WITH A MEAL. 09/21/20  Yes Fenton, Clint R, PA  OZEMPIC, 0.25 OR 0.5 MG/DOSE, 2 MG/1.5ML SOPN Inject 0.5 mg into the skin every Saturday.  03/28/20  Yes [provider]  pantoprazole (PROTONIX) 40 MG tablet Take 40 mg by mouth daily before breakfast.    Yes [provider]  SYNJARDY XR 12.03-999 MG TB24 Take 1 tablet by mouth in the morning and at bedtime. 01/03/20  Yes [provider]  XARELTO 20 MG TABS tablet TAKE 1 TABLET (20 MG TOTAL) BY MOUTH DAILY WITH  SUPPER. 11/26/20  Yes Branch, Alphonse Guild, MD  acetaminophen (TYLENOL) 325 MG tablet Take 650 mg by mouth every 6 (six) hours as needed for moderate pain or headache.     [provider]  furosemide (LASIX) 20 MG tablet Take 20 mg by mouth daily as needed for fluid.     [provider]  nitroGLYCERIN (NITROSTAT) 0.4 MG SL tablet Place 1 tablet (0.4 mg total) under the tongue every 5 (five) minutes as needed for chest pain. 03/16/18   Arnoldo Lenis, MD    ROS: All other systems have been reviewed and were otherwise negative with the exception of those mentioned in the HPI and as above.  Physical Exam: General: Alert, no acute distress Cardiovascular: No pedal edema Respiratory: No cyanosis, no use of accessory musculature GI: No organomegaly, abdomen is soft and non-tender Skin: No lesions in the area of chief complaint Neurologic: Sensation intact distally Psychiatric: Patient is competent for consent with normal mood and affect Lymphatic: No axillary or cervical lymphadenopathy  MUSCULOSKELETAL:  Examination of the right shoulder demonstrates passive forward elevation to 160 degrees, external rotation to 45 degrees.  Cuff strength is 5/5.  On supraspinatus and infraspinatus testing positive O'Brien's, positive impingement, positive AC tenderness to palpation.  Imaging: MRI demonstrates moderate cuff tendinosis.  Possible partial thickness bursal sided tear of the supraspinatus and subscapularis tendinopathy versus partial tearing.    Assessment: RIGHT SHOULDER ARTICULAR CARTILAGE DISORDERS, PRIMARY OSTEOARTHRITIS, IMPINGEMENT SYNDROME, BICIPITAL TENDINITIS  Plan: Plan for Procedure(s): RIGHT SHOULDER ARTHROSCOPY WITH DEBRIDEMENT, DISTAL CLAVICAL EXCISION, ACROMIOPLASTY, BICEP TENODESIS  The risks benefits and alternatives were discussed with the patient including but not limited to the risks of nonoperative treatment, versus surgical intervention including  infection, bleeding, nerve injury,  blood clots, cardiopulmonary complications, morbidity, mortality, among others, and they were willing to proceed.   The patient acknowledged the explanation, agreed to proceed with the plan and consent was signed.   He has received operative clearance from his PCP, Dr. Jerene Bears, and cardiologist, Dr. Harl Bowie.  Operative Plan: Right shoulder scope with SAD, DCE, BT, possible cuff repair versus debridement Discharge Medications: Tylenol, Oxycodone, Zofran, Robaxin DVT Prophylaxis: Resume xarelto Physical Therapy: Outpatient PT Special Discharge needs: [place], PA-C  12/26/2020 6:53 PM

## 2020-12-27 ENCOUNTER — Ambulatory Visit (HOSPITAL_BASED_OUTPATIENT_CLINIC_OR_DEPARTMENT_OTHER): Payer: 59 | Admitting: Anesthesiology

## 2020-12-27 ENCOUNTER — Encounter (HOSPITAL_BASED_OUTPATIENT_CLINIC_OR_DEPARTMENT_OTHER): Payer: Self-pay | Admitting: Orthopaedic Surgery

## 2020-12-27 ENCOUNTER — Ambulatory Visit (HOSPITAL_BASED_OUTPATIENT_CLINIC_OR_DEPARTMENT_OTHER)
Admission: RE | Admit: 2020-12-27 | Discharge: 2020-12-27 | Disposition: A | Discharge status: Home / Self Care | Payer: 59 | Attending: Orthopaedic Surgery | Admitting: Orthopaedic Surgery

## 2020-12-27 ENCOUNTER — Other Ambulatory Visit: Payer: Self-pay

## 2020-12-27 ENCOUNTER — Encounter (HOSPITAL_BASED_OUTPATIENT_CLINIC_OR_DEPARTMENT_OTHER)
Admission: RE | Disposition: A | Discharge status: Home / Self Care | Payer: Self-pay | Source: Home / Self Care | Attending: Orthopaedic Surgery

## 2020-12-27 DIAGNOSIS — Z7901 Long term (current) use of anticoagulants: Secondary | ICD-10-CM | POA: Insufficient documentation

## 2020-12-27 DIAGNOSIS — I10 Essential (primary) hypertension: Secondary | ICD-10-CM | POA: Insufficient documentation

## 2020-12-27 DIAGNOSIS — M19011 Primary osteoarthritis, right shoulder: Secondary | ICD-10-CM | POA: Diagnosis not present

## 2020-12-27 DIAGNOSIS — X58XXXA Exposure to other specified factors, initial encounter: Secondary | ICD-10-CM | POA: Insufficient documentation

## 2020-12-27 DIAGNOSIS — K219 Gastro-esophageal reflux disease without esophagitis: Secondary | ICD-10-CM | POA: Insufficient documentation

## 2020-12-27 DIAGNOSIS — I2510 Atherosclerotic heart disease of native coronary artery without angina pectoris: Secondary | ICD-10-CM | POA: Insufficient documentation

## 2020-12-27 DIAGNOSIS — M7521 Bicipital tendinitis, right shoulder: Secondary | ICD-10-CM | POA: Insufficient documentation

## 2020-12-27 DIAGNOSIS — M75111 Incomplete rotator cuff tear or rupture of right shoulder, not specified as traumatic: Secondary | ICD-10-CM | POA: Diagnosis present

## 2020-12-27 DIAGNOSIS — Z91030 Bee allergy status: Secondary | ICD-10-CM | POA: Insufficient documentation

## 2020-12-27 DIAGNOSIS — S43431A Superior glenoid labrum lesion of right shoulder, initial encounter: Secondary | ICD-10-CM | POA: Diagnosis not present

## 2020-12-27 DIAGNOSIS — E785 Hyperlipidemia, unspecified: Secondary | ICD-10-CM | POA: Diagnosis not present

## 2020-12-27 DIAGNOSIS — Z79899 Other long term (current) drug therapy: Secondary | ICD-10-CM | POA: Diagnosis not present

## 2020-12-27 DIAGNOSIS — I4891 Unspecified atrial fibrillation: Secondary | ICD-10-CM | POA: Diagnosis not present

## 2020-12-27 DIAGNOSIS — Z7984 Long term (current) use of oral hypoglycemic drugs: Secondary | ICD-10-CM | POA: Insufficient documentation

## 2020-12-27 DIAGNOSIS — E1165 Type 2 diabetes mellitus with hyperglycemia: Secondary | ICD-10-CM | POA: Diagnosis not present

## 2020-12-27 HISTORY — DX: Other chronic pain: G89.29

## 2020-12-27 LAB — GLUCOSE, CAPILLARY
Glucose-Capillary: 162 mg/dL — ABNORMAL HIGH (ref 70–99)
Glucose-Capillary: 177 mg/dL — ABNORMAL HIGH (ref 70–99)

## 2020-12-27 SURGERY — SHOULDER ARTHROSCOPY WITH SUBACROMIAL DECOMPRESSION AND DISTAL CLAVICLE EXCISION
Anesthesia: Regional | Site: Shoulder | Laterality: Right

## 2020-12-27 MED ORDER — EPINEPHRINE PF 1 MG/ML IJ SOLN
INTRAMUSCULAR | Status: AC
  Filled 2020-12-27: qty 4

## 2020-12-27 MED ORDER — LIDOCAINE 2% (20 MG/ML) 5 ML SYRINGE
INTRAMUSCULAR | Status: DC | PRN
  Administered 2020-12-27: 80 mg via INTRAVENOUS

## 2020-12-27 MED ORDER — SODIUM CHLORIDE 0.9 % IR SOLN
Status: DC | PRN
  Administered 2020-12-27 (×3): 3000 mL

## 2020-12-27 MED ORDER — CEFAZOLIN SODIUM-DEXTROSE 2-4 GM/100ML-% IV SOLN
2.0000 g | INTRAVENOUS | Status: AC
  Administered 2020-12-27: 2 g via INTRAVENOUS

## 2020-12-27 MED ORDER — FENTANYL CITRATE (PF) 100 MCG/2ML IJ SOLN
INTRAMUSCULAR | Status: AC
  Filled 2020-12-27: qty 2

## 2020-12-27 MED ORDER — AMISULPRIDE (ANTIEMETIC) 5 MG/2ML IV SOLN: 10.0000 mg | Freq: Once | INTRAVENOUS | Status: DC | PRN

## 2020-12-27 MED ORDER — ACETAMINOPHEN 500 MG PO TABS: 1000.0000 mg | ORAL_TABLET | Freq: Three times a day (TID) | ORAL | 0 refills | Status: AC

## 2020-12-27 MED ORDER — MIDAZOLAM HCL 2 MG/2ML IJ SOLN
INTRAMUSCULAR | Status: AC
  Filled 2020-12-27: qty 2

## 2020-12-27 MED ORDER — PROPOFOL 10 MG/ML IV BOLUS
INTRAVENOUS | Status: DC | PRN
  Administered 2020-12-27: 120 mg via INTRAVENOUS

## 2020-12-27 MED ORDER — OXYCODONE HCL 5 MG PO TABS: ORAL_TABLET | ORAL | 0 refills | Status: AC

## 2020-12-27 MED ORDER — BUPIVACAINE LIPOSOME 1.3 % IJ SUSP
INTRAMUSCULAR | Status: DC | PRN
  Administered 2020-12-27: 10 mL via PERINEURAL

## 2020-12-27 MED ORDER — ACETAMINOPHEN 500 MG PO TABS
1000.0000 mg | ORAL_TABLET | Freq: Once | ORAL | Status: AC
  Administered 2020-12-27: 1000 mg via ORAL

## 2020-12-27 MED ORDER — SUGAMMADEX SODIUM 200 MG/2ML IV SOLN
INTRAVENOUS | Status: DC | PRN
  Administered 2020-12-27: 250 mg via INTRAVENOUS

## 2020-12-27 MED ORDER — ACETAMINOPHEN 500 MG PO TABS
ORAL_TABLET | ORAL | Status: AC
  Filled 2020-12-27: qty 2

## 2020-12-27 MED ORDER — GABAPENTIN 300 MG PO CAPS: 300.0000 mg | ORAL_CAPSULE | Freq: Three times a day (TID) | ORAL | 0 refills | Status: DC

## 2020-12-27 MED ORDER — ROCURONIUM BROMIDE 10 MG/ML (PF) SYRINGE
PREFILLED_SYRINGE | INTRAVENOUS | Status: AC
  Filled 2020-12-27: qty 20

## 2020-12-27 MED ORDER — PHENYLEPHRINE 40 MCG/ML (10ML) SYRINGE FOR IV PUSH (FOR BLOOD PRESSURE SUPPORT)
PREFILLED_SYRINGE | INTRAVENOUS | Status: DC | PRN
  Administered 2020-12-27: 80 ug via INTRAVENOUS
  Administered 2020-12-27: 120 ug via INTRAVENOUS
  Administered 2020-12-27 (×4): 80 ug via INTRAVENOUS
  Administered 2020-12-27: 120 ug via INTRAVENOUS
  Administered 2020-12-27: 80 ug via INTRAVENOUS

## 2020-12-27 MED ORDER — LACTATED RINGERS IV SOLN: INTRAVENOUS | Status: DC

## 2020-12-27 MED ORDER — LIDOCAINE 2% (20 MG/ML) 5 ML SYRINGE
INTRAMUSCULAR | Status: AC
  Filled 2020-12-27: qty 5

## 2020-12-27 MED ORDER — FENTANYL CITRATE (PF) 100 MCG/2ML IJ SOLN
25.0000 ug | INTRAMUSCULAR | Status: DC | PRN
  Administered 2020-12-27: 50 ug via INTRAVENOUS

## 2020-12-27 MED ORDER — BUPIVACAINE HCL (PF) 0.5 % IJ SOLN
INTRAMUSCULAR | Status: DC | PRN
  Administered 2020-12-27: 15 mL via PERINEURAL

## 2020-12-27 MED ORDER — FENTANYL CITRATE (PF) 100 MCG/2ML IJ SOLN
100.0000 ug | Freq: Once | INTRAMUSCULAR | Status: AC
  Administered 2020-12-27: 50 ug via INTRAVENOUS

## 2020-12-27 MED ORDER — CEFAZOLIN SODIUM-DEXTROSE 2-4 GM/100ML-% IV SOLN
INTRAVENOUS | Status: AC
  Filled 2020-12-27: qty 100

## 2020-12-27 MED ORDER — MIDAZOLAM HCL 2 MG/2ML IJ SOLN
2.0000 mg | Freq: Once | INTRAMUSCULAR | Status: AC
  Administered 2020-12-27: 1 mg via INTRAVENOUS

## 2020-12-27 MED ORDER — DEXAMETHASONE SODIUM PHOSPHATE 10 MG/ML IJ SOLN
INTRAMUSCULAR | Status: DC | PRN
  Administered 2020-12-27: 5 mg via INTRAVENOUS

## 2020-12-27 MED ORDER — ONDANSETRON HCL 4 MG/2ML IJ SOLN
INTRAMUSCULAR | Status: DC | PRN
  Administered 2020-12-27: 4 mg via INTRAVENOUS

## 2020-12-27 MED ORDER — CEFAZOLIN SODIUM-DEXTROSE 1-4 GM/50ML-% IV SOLN
INTRAVENOUS | Status: AC
  Filled 2020-12-27: qty 50

## 2020-12-27 MED ORDER — ONDANSETRON HCL 4 MG PO TABS: 4.0000 mg | ORAL_TABLET | Freq: Three times a day (TID) | ORAL | 1 refills | Status: AC | PRN

## 2020-12-27 MED ORDER — ONDANSETRON HCL 4 MG/2ML IJ SOLN: 4.0000 mg | Freq: Once | INTRAMUSCULAR | Status: DC | PRN

## 2020-12-27 MED ORDER — BUPIVACAINE HCL (PF) 0.25 % IJ SOLN
INTRAMUSCULAR | Status: AC
  Filled 2020-12-27: qty 30

## 2020-12-27 MED ORDER — ROCURONIUM BROMIDE 10 MG/ML (PF) SYRINGE
PREFILLED_SYRINGE | INTRAVENOUS | Status: DC | PRN
  Administered 2020-12-27: 70 mg via INTRAVENOUS

## 2020-12-27 MED ORDER — EPHEDRINE SULFATE 50 MG/ML IJ SOLN
INTRAMUSCULAR | Status: DC | PRN
  Administered 2020-12-27 (×2): 10 mg via INTRAVENOUS

## 2020-12-27 SURGICAL SUPPLY — 61 items
AID PSTN UNV HD RSTRNT DISP (MISCELLANEOUS) ×1
ANCH SUT SWLK 19.1X4.75 (Anchor) ×2 IMPLANT
ANCHOR SUT 1.8 FBRTK KNTLS 2SU (Anchor) ×2 IMPLANT
ANCHOR SUT BIO SW 4.75X19.1 (Anchor) ×2 IMPLANT
APL PRP STRL LF DISP 70% ISPRP (MISCELLANEOUS) ×1
BLADE EXCALIBUR 4.0X13 (MISCELLANEOUS) ×2 IMPLANT
BLADE SURG 10 STRL SS (BLADE) IMPLANT
BURR OVAL 8 FLU 4.0X13 (MISCELLANEOUS) IMPLANT
CANNULA 5.75X71 LONG (CANNULA) IMPLANT
CANNULA PASSPORT 5 (CANNULA) IMPLANT
CANNULA PASSPORT BUTTON 10-40 (CANNULA) ×1 IMPLANT
CANNULA TWIST IN 8.25X7CM (CANNULA) IMPLANT
CHLORAPREP W/TINT 26 (MISCELLANEOUS) ×2 IMPLANT
CLSR STERI-STRIP ANTIMIC 1/2X4 (GAUZE/BANDAGES/DRESSINGS) ×2 IMPLANT
COOLER ICEMAN CLASSIC (MISCELLANEOUS) ×2 IMPLANT
COVER WAND RF STERILE (DRAPES) IMPLANT
DRAPE IMP U-DRAPE 54X76 (DRAPES) ×2 IMPLANT
DRAPE INCISE IOBAN 66X45 STRL (DRAPES) IMPLANT
DRAPE SHOULDER BEACH CHAIR (DRAPES) ×2 IMPLANT
DRSG PAD ABDOMINAL 8X10 ST (GAUZE/BANDAGES/DRESSINGS) ×2 IMPLANT
DW OUTFLOW CASSETTE/TUBE SET (MISCELLANEOUS) ×2 IMPLANT
GAUZE SPONGE 4X4 12PLY STRL (GAUZE/BANDAGES/DRESSINGS) ×2 IMPLANT
GLOVE ECLIPSE 8.0 STRL XLNG CF (GLOVE) ×2 IMPLANT
GLOVE SRG 8 PF TXTR STRL LF DI (GLOVE) ×1 IMPLANT
GLOVE SURG ENC MOIS LTX SZ6.5 (GLOVE) ×2 IMPLANT
GLOVE SURG UNDER POLY LF SZ6.5 (GLOVE) ×2 IMPLANT
GLOVE SURG UNDER POLY LF SZ8 (GLOVE) ×2
GOWN STRL REUS W/ TWL LRG LVL3 (GOWN DISPOSABLE) ×2 IMPLANT
GOWN STRL REUS W/TWL LRG LVL3 (GOWN DISPOSABLE) ×4
GOWN STRL REUS W/TWL XL LVL3 (GOWN DISPOSABLE) ×2 IMPLANT
KIT STR SPEAR 1.8 FBRTK DISP (KITS) ×1 IMPLANT
LASSO CRESCENT QUICKPASS (SUTURE) IMPLANT
MANIFOLD NEPTUNE II (INSTRUMENTS) ×2 IMPLANT
NDL SAFETY ECLIPSE 18X1.5 (NEEDLE) ×1 IMPLANT
NDL SCORPION MULTI FIRE (NEEDLE) IMPLANT
NEEDLE HYPO 18GX1.5 SHARP (NEEDLE) ×2
NEEDLE SCORPION MULTI FIRE (NEEDLE) ×2 IMPLANT
PACK ARTHROSCOPY DSU (CUSTOM PROCEDURE TRAY) ×2 IMPLANT
PACK BASIN DAY SURGERY FS (CUSTOM PROCEDURE TRAY) ×2 IMPLANT
PAD COLD SHLDR WRAP-ON (PAD) ×2 IMPLANT
PORT APPOLLO RF 90DEGREE MULTI (SURGICAL WAND) ×2 IMPLANT
RESTRAINT HEAD UNIVERSAL NS (MISCELLANEOUS) ×2 IMPLANT
SET SHOULDER TRAC (MISCELLANEOUS) IMPLANT
SET SHOULDER TRACTION (MISCELLANEOUS) ×2
SHEET MEDIUM DRAPE 40X70 STRL (DRAPES) IMPLANT
SLEEVE SCD COMPRESS KNEE MED (MISCELLANEOUS) ×2 IMPLANT
SLING ARM FOAM STRAP LRG (SOFTGOODS) IMPLANT
SLING ARM IMMOBILIZER XL (CAST SUPPLIES) ×1 IMPLANT
SUT FIBERWIRE #2 38 T-5 BLUE (SUTURE)
SUT MNCRL AB 4-0 PS2 18 (SUTURE) ×2 IMPLANT
SUT PDS AB 1 CT  36 (SUTURE)
SUT PDS AB 1 CT 36 (SUTURE) IMPLANT
SUT TIGER TAPE 7 IN WHITE (SUTURE) IMPLANT
SUTURE FIBERWR #2 38 T-5 BLUE (SUTURE) IMPLANT
SUTURE TAPE TIGERLINK 1.3MM BL (SUTURE) IMPLANT
SUTURETAPE TIGERLINK 1.3MM BL (SUTURE)
SYR 5ML LL (SYRINGE) ×2 IMPLANT
TAPE FIBER 2MM 7IN #2 BLUE (SUTURE) IMPLANT
TOWEL GREEN STERILE FF (TOWEL DISPOSABLE) ×4 IMPLANT
TUBE CONNECTING 20X1/4 (TUBING) ×2 IMPLANT
TUBING ARTHROSCOPY IRRIG 16FT (MISCELLANEOUS) ×2 IMPLANT

## 2020-12-27 NOTE — Anesthesia Preprocedure Evaluation (Addendum)
Anesthesia Evaluation  Patient identified by MRN, date of birth, ID band Patient awake    Reviewed: Allergy & Precautions, NPO status , Patient's Chart, lab work & pertinent test results  Airway Mallampati: II  TM Distance: >3 FB Neck ROM: Full    Dental no notable dental hx. (+) Teeth Intact   Pulmonary sleep apnea ,    Pulmonary exam normal breath sounds clear to auscultation       Cardiovascular Exercise Tolerance: Poor hypertension, + CAD  Normal cardiovascular exam+ dysrhythmias (on xarelto s/p ablation ) Atrial Fibrillation  Rhythm:Regular Rate:Normal  ECHO 08/2020:   1. Left ventricular ejection fraction, by estimation, is 55 to 60%. The  left ventricle has normal function.  2. Right ventricular systolic function is normal. The right ventricular  size is normal.  3. No left atrial/left atrial appendage thrombus was detected.  4. The mitral valve is normal in structure. No evidence of mitral valve  regurgitation. No evidence of mitral stenosis.  5. The aortic valve is normal in structure. Aortic valve regurgitation is  not visualized. No aortic stenosis is present.    Neuro/Psych  Neuromuscular disease (bilateral carpal tunnel) CVA negative psych ROS   GI/Hepatic GERD  ,  Endo/Other  diabetes, Type 2Morbid obesity  Renal/GU   negative genitourinary   Musculoskeletal  (+) Arthritis , Osteoarthritis,    Abdominal (+) + obese,   Peds  Hematology   Anesthesia Other Findings   Reproductive/Obstetrics negative OB ROS                            Anesthesia Physical Anesthesia Plan  ASA: III  Anesthesia Plan: General and Regional   Post-op Pain Management: GA combined w/ Regional for post-op pain   Induction: Intravenous  PONV Risk Score and Plan: 2 and Treatment may vary due to age or medical condition, Ondansetron and Midazolam  Airway Management Planned: Oral  ETT  Additional Equipment:   Intra-op Plan:   Post-operative Plan: Extubation in OR  Informed Consent: I have reviewed the patients History and Physical, chart, labs and discussed the procedure including the risks, benefits and alternatives for the proposed anesthesia with the patient or authorized representative who has indicated his/her understanding and acceptance.       Plan Discussed with: CRNA and Anesthesiologist  Anesthesia Plan Comments: (Interscalene block with exparel. GETA. )       Anesthesia Quick Evaluation

## 2020-12-27 NOTE — Progress Notes (Signed)
Assisted Dr. Elgie Congo with right, ultrasound guided, interscalene  block. Side rails up, monitors on throughout procedure. See vital signs in flow sheet. Tolerated Procedure well.

## 2020-12-27 NOTE — Interval H&P Note (Signed)
History and Physical Interval Note:  12/27/2020 9:09 AM  Lucas [name].  has presented today for surgery, with the diagnosis of RIGHT SHOULDER ARTICULAR CARTILAGE DISORDERS, PRIMARY OSTEOARTHRITIS, IMPINGEMENT SYNDROME, BICIPITAL TENDINITIS.  The various methods of treatment have been discussed with the patient and family. After consideration of risks, benefits and other options for treatment, the patient has consented to  Procedure(s): RIGHT SHOULDER ARTHROSCOPY WITH DEBRIDEMENT, DISTAL CLAVICAL EXCISION, ACROMIOPLASTY, BICEP TENODESIS (Right) as a surgical intervention.  The patient's history has been reviewed, patient examined, no change in status, stable for surgery.  I have reviewed the patient's chart and labs.  Questions were answered to the patient's satisfaction.     [name]

## 2020-12-27 NOTE — Discharge Instructions (Signed)
Ophelia Charter MD, MPH Noemi Chapel, PA-C [facility] [address], [address] [phone] (tel)   [phone] (fax)   POST-OPERATIVE INSTRUCTIONS - SHOULDER ARTHROSCOPY  WOUND CARE  You may remove the Operative Dressing on Post-Op Day #3 (72hrs after surgery).    Alternatively if you would like you can leave dressing on until follow-up if within 7-8 days but keep it dry.  Leave steri-strips in place until they fall off on their own, usually 2 weeks postop.  There may be a small amount of fluid/bleeding leaking at the surgical site.  o This is normal; the shoulder is filled with fluid during the procedure and can leak for 24-48hrs after surgery.   You may change/reinforce the bandage as needed.   Use the Cryocuff or Ice as often as possible for the first 7 days, then as needed for pain relief. Always keep a towel, ACE wrap or other barrier between the cooling unit and your skin.   You may shower on Post-Op Day #3. Gently pat the area dry. Do not soak the shoulder in water or submerge it. Keep dry incisions as dry as possible.  Do not go swimming in the pool or ocean until 4 weeks after surgery or when otherwise instructed.    EXERCISES/BRACING ? Sling should be used at all times until follow-up.  ? You can remove sling for hygiene.    ? Please schedule a physical therapy appointment ASAP if you have not already done so ? This will be very important for your recovery  Please continue to ambulate and do not stay sitting or lying for too long. Perform foot and wrist pumps to assist in circulation.  POST-OP MEDICATIONS- Multimodal approach to pain control  In general your pain will be controlled with a combination of substances.  Prescriptions unless otherwise discussed are electronically sent to your pharmacy.  This is a carefully made plan we use to minimize narcotic use.     ? Acetaminophen - Non-narcotic pain medicine taken on a scheduled basis   ? Oxycodone - This is a strong narcotic, to be used only on an as needed basis for pain. ? Gabapentin - Non-narcotic pain medicine taken on a scheduled basis  ? Zofran - take as needed for nausea  FOLLOW-UP  If you develop a Fever (?101.5), Redness or Drainage from the surgical incision site, please call our office to arrange for an evaluation.  Please call the office to schedule a follow-up appointment for your suture removal, 10-14 days post-operatively.    HELPFUL INFORMATION   If you had a block, it will wear off between 8-24 hrs postop typically.  This is period when your pain may go from nearly zero to the pain you would have had postop without the block.  This is an abrupt transition but nothing dangerous is happening.  You may take an extra dose of narcotic when this happens.   You may be more comfortable sleeping in a semi-seated position the first few nights following surgery.  Keep a pillow propped under the elbow and forearm for comfort.  If you have a recliner type of chair it might be beneficial.  If not that is fine too, but it would be helpful to sleep propped up with pillows behind your operated shoulder as well under your elbow and forearm.  This will reduce pulling on the suture lines.   When dressing, put your operative arm in the sleeve first.  When getting undressed, take your  operative arm out last.  Loose fitting, button-down shirts are recommended.  Often in the first days after surgery you may be more comfortable keeping your operative arm under your shirt and not through the sleeve.   You may return to work/school in the next couple of days when you feel up to it.  Desk work and typing in the sling is fine.   We suggest you use the pain medication the first night prior to going to bed, in order to ease any pain when the anesthesia wears off. You should avoid taking pain medications on an empty stomach as it will make you nauseous.   You should wean off  your narcotic medicines as soon as you are able.  Most patients will be off or using minimal narcotics before their first postop appointment.    Do not drink alcoholic beverages or take illicit drugs when taking pain medications.   It is against the law to drive while taking narcotics.  In some states it is against the law to drive while your arm is in a sling.    Pain medication may make you constipated.  Below are a few solutions to try in this order: - Decrease the amount of pain medication if you aren't having pain. - Drink lots of decaffeinated fluids. - Drink prune juice and/or eat dried prunes   If the first 3 don't work start with additional solutions - Take Colace - an over-the-counter stool softener - Take Senokot - an over-the-counter laxative - Take Miralax - a stronger over-the-counter laxative  For more information including helpful videos and documents visit our website:   [email or web address]       Post Anesthesia Home Care Instructions  Activity: Get plenty of rest for the remainder of the day. A responsible individual must stay with you for 24 hours following the procedure.  For the next 24 hours, DO NOT: -Drive a car -Paediatric nurse -Drink alcoholic beverages -Take any medication unless instructed by your physician -Make any legal decisions or sign important papers.  Meals: Start with liquid foods such as gelatin or soup. Progress to regular foods as tolerated. Avoid greasy, spicy, heavy foods. If nausea and/or vomiting occur, drink only clear liquids until the nausea and/or vomiting subsides. Call your physician if vomiting continues.  Special Instructions/Symptoms: Your throat may feel dry or sore from the anesthesia or the breathing tube placed in your throat during surgery. If this causes discomfort, gargle with warm salt water. The discomfort should disappear within 24 hours.  If you had a scopolamine patch  placed behind your ear for the management of post- operative nausea and/or vomiting:  1. The medication in the patch is effective for 72 hours, after which it should be removed.  Wrap patch in a tissue and discard in the trash. Wash hands thoroughly with soap and water. 2. You may remove the patch earlier than 72 hours if you experience unpleasant side effects which may include dry mouth, dizziness or visual disturbances. 3. Avoid touching the patch. Wash your hands with soap and water after contact with the patch.       Regional Anesthesia Blocks  1. Numbness or the inability to move the "blocked" extremity may last from 3-48 hours after placement. The length of time depends on the medication injected and your individual response to the medication. If the numbness is not going away after 48 hours, call your surgeon.  2. The extremity that is blocked will need to be protected until  the numbness is gone and the  Strength has returned. Because you cannot feel it, you will need to take extra care to avoid injury. Because it may be weak, you may have difficulty moving it or using it. You may not know what position it is in without looking at it while the block is in effect.  3. For blocks in the legs and feet, returning to weight bearing and walking needs to be done carefully. You will need to wait until the numbness is entirely gone and the strength has returned. You should be able to move your leg and foot normally before you try and bear weight or walk. You will need someone to be with you when you first try to ensure you do not fall and possibly risk injury.  4. Bruising and tenderness at the needle site are common side effects and will resolve in a few days.  5. Persistent numbness or new problems with movement should be communicated to the surgeon or the [facility] [phone] [place] [phone]).    Call your surgeon if you experience:   1.  Fever over  101.0. 2.  Inability to urinate. 3.  Nausea and/or vomiting. 4.  Extreme swelling or bruising at the surgical site. 5.  Continued bleeding from the incision. 6.  Increased pain, redness or drainage from the incision. 7.  Problems related to your pain medication. 8.  Any problems and/or concerns Information for Discharge Teaching: EXPAREL (bupivacaine liposome injectable suspension)   Your surgeon or anesthesiologist gave you EXPAREL(bupivacaine) to help control your pain after surgery.   EXPAREL is a local anesthetic that provides pain relief by numbing the tissue around the surgical site.  EXPAREL is designed to release pain medication over time and can control pain for up to 72 hours.  Depending on how you respond to EXPAREL, you may require less pain medication during your recovery.  Possible side effects:  Temporary loss of sensation or ability to move in the area where bupivacaine was injected.  Nausea, vomiting, constipation  Rarely, numbness and tingling in your mouth or lips, lightheadedness, or anxiety may occur.  Call your doctor right away if you think you may be experiencing any of these sensations, or if you have other questions regarding possible side effects.  Follow all other discharge instructions given to you by your surgeon or nurse. Eat a healthy diet and drink plenty of water or other fluids.  If you return to the hospital for any reason within 96 hours following the administration of EXPAREL, it is important for health care providers to know that you have received this anesthetic. A teal colored band has been placed on your arm with the date, time and amount of EXPAREL you have received in order to alert and inform your health care providers. Please leave this armband in place for the full 96 hours following administration, and then you may remove the band.

## 2020-12-27 NOTE — Anesthesia Postprocedure Evaluation (Signed)
Anesthesia Post Note  Patient: Lucas [name].  Procedure(s) Performed: RIGHT SHOULDER ARTHROSCOPY WITH DEBRIDEMENT, DISTAL CLAVICAL EXCISION, ACROMIOPLASTY, BICEP TENODESIS, ROTATOR CUFF REPAIR (Right Shoulder)     Patient location during evaluation: PACU Anesthesia Type: Regional and General Level of consciousness: awake Pain management: pain level controlled Vital Signs Assessment: post-procedure vital signs reviewed and stable Respiratory status: spontaneous breathing and respiratory function stable Cardiovascular status: stable Postop Assessment: no apparent nausea or vomiting Anesthetic complications: no   No complications documented.  Last Vitals:  Vitals:   12/27/20 1207 12/27/20 1221  BP:  (!) 112/58  Pulse: 66 64  Resp: 18 (!) 64  Temp:  36.6 C  SpO2: 91% 94%    Last Pain:  Vitals:   12/27/20 1221  TempSrc: Oral  PainSc:                  Merlinda Frederick

## 2020-12-27 NOTE — Anesthesia Procedure Notes (Signed)
Procedure Name: Intubation Date/Time: 12/27/2020 9:49 AM Performed by: Imagene Riches, CRNA Pre-anesthesia Checklist: Patient identified, Emergency Drugs available, Suction available and Patient being monitored Patient Re-evaluated:Patient Re-evaluated prior to induction Oxygen Delivery Method: Circle System Utilized Preoxygenation: Pre-oxygenation with 100% oxygen Induction Type: IV induction Ventilation: Mask ventilation without difficulty and Oral airway inserted - appropriate to patient size Laryngoscope Size: Miller and 3 Grade View: Grade II Tube type: Oral Tube size: 7.5 mm Number of attempts: 1 Airway Equipment and Method: Stylet and Oral airway Placement Confirmation: ETT inserted through vocal cords under direct vision,  positive ETCO2 and breath sounds checked- equal and bilateral Secured at: 23 cm Tube secured with: Tape Dental Injury: Teeth and Oropharynx as per pre-operative assessment

## 2020-12-27 NOTE — Op Note (Signed)
Orthopaedic Surgery Operative Note (CSN: [id number])  Lucas [name].  08/21/1956 Date of Surgery: 12/27/2020   Diagnoses:  Right shoulder intrasubstance cuff tear with articular sided component, chronic biceps tendinitis and SLAP tear, AC arthrosis impingement in the setting of poorly controlled diabetes  Procedure: Arthroscopic extensive debridement Arthroscopic subacromial decompression Arthroscopic rotator cuff repair Arthroscopic biceps tenodesis Arthroscopic distal clavicle excision   Operative Finding Exam under anesthesia: Full motion no limitation no instability Articular space: No loose bodies, capsule intact, gross fraying of the labrum superiorly anteriorly and superior posterior.  All debrided back to stable base. Chondral surfaces: There was some superior anterior wear underneath the SLAP lesion primarily.  This is likely in the setting of chronic inflammation.  Some grade 1 changes scattered throughout the remainder the chondral surfaces. Biceps: Type II SLAP tear with clear instability. Subscapularis: Normal Superior Cuff: There is a small articular sided leading edge supraspinatus tear that was noted.  MRI confirmed that there was an intrasubstance tear as well.  We marked this tear before going to the bursal side. Bursal side: We identified our mark and noted that there was easily probed tissue consistent with a high-grade intratenderness tear.  We took down this area to complete the tear and performed a repair with a one by one configuration.  Successful completion of the planned procedure.  Patient's tissue quality bone quality reasonable however we worry about the possibility of arthrofibrosis as well as healing the setting of his historically poorly controlled diabetes.  We counseled the family on the need for aggressive therapy as well as tight glycemic control.   Post-operative plan: The patient will be non-weightbearing in a sling for 6 weeks with early therapy  protocol.  The patient will be discharged home.  DVT prophylaxis not indicated in ambulatory upper extremity patient without known risk factors.   Pain control with PRN pain medication preferring oral medicines.  Follow up plan will be scheduled in approximately 7 days for incision check and XR.  Post-Op Diagnosis: Same Surgeons:Primary: [name], MD Assistants:Caroline McBane PA-C Location: [facility] OR ROOM 8 Anesthesia: General with Exparel interscalene block Antibiotics: Ancef 3 g Tourniquet time: None Estimated Blood Loss: Minimal Complications: None Specimens: None Implants: Implant Name Type Inv. Item Serial No. Manufacturer Lot No. LRB No. Used Action  ANCHOR SUT 1.8 FIBERTAK 2 SUT - [id number] Anchor ANCHOR SUT 1.8 FIBERTAK 2 SUT  [employer] [id number] Right 1 Implanted  ANCHOR SUT BIO SW 4.75X19.1 - [id number] Anchor ANCHOR SUT BIO SW 4.75X19.1  [employer] [id number] Right 1 Implanted  ANCHOR SUT BIO SW 4.75X19.1 - [id number] Anchor ANCHOR SUT BIO SW 4.75X19.1  [employer] [id number] Right 1 Implanted  ANCHOR SUT 1.8 FIBERTAK 2 SUT - [id number] Anchor ANCHOR SUT 1.8 FIBERTAK 2 SUT  [place] [id number] Right 1 Implanted    Indications for Surgery:   Lucas [name]. is a 65 y.o. male with continued shoulder pain refractory to nonoperative measures for extended period of time.  MRI demonstrated a intrasubstance the leading edge supraspinatus tear, biceps tendinitis and SLAP tear amongst other issues.  Patient had poorly controlled diabetes with a elevated A1c for an extended period of time which precluded surgery and he eventually was able to get it under better control.  He at that point was a candidate for surgery.  The risks and benefits were explained at length including but not limited to continued pain, cuff failure, biceps tenodesis failure, stiffness, need for further surgery and infection.  Procedure:   Patient was correctly identified in the preoperative holding area and  operative site marked.  Patient brought to OR and positioned beachchair on an [place] table ensuring that all bony prominences were padded and the head was in an appropriate location.  Anesthesia was induced and the operative shoulder was prepped and draped in the usual sterile fashion.  Timeout was called preincision.  A standard posterior viewing portal was made after localizing the portal with a spinal needle.  An anterior accessory portal was also made.  After clearing the articular space the camera was positioned in the subacromial space.  Findings above.    Extensive debridement was performed of the anterior interval tissue, labral fraying and the bursa.  Subacromial decompression: We made a lateral portal with spinal needle guidance. We then proceeded to debride bursal tissue extensively with a shaver and arthrocare device. At that point we continued to identify the borders of the acromion and identify the spur. We then carefully preserved the deltoid fascia and used a burr to convert the acromion to a Type 1 flat acromion without issue.  Biceps tenodesis: We marked the tendon and then performed a tenotomy and debridement of the stump in the articular space. We then identified the biceps tendon in its groove suprapec with the arthroscope in the lateral portal taking care to move from lateral to medial to avoid injury to the subscapularis. At that point we unroofed the tendon itself and mobilized it. An accessory anterior portal was made in line with the tendon and we grasped it from the anterior superior portal and worked from the accessory anterior portal. Two Fibertak 1.63mm knotless anchors were placed in the groove and the tendon was secured in a luggage loop style fashion with a pass of the limb of suture through the tendon using a scorpion device to avoid pull-through.  Repair was completed with good tension on the tendon.  Residual stump of the tendon was removed after being resected with a RF  ablator.  Distal Clavicle resection:  The scope was placed in the subacromial space from the posterior portal.  A hemostat was placed through the anterior portal and we spread at the [facility] joint.  A burr was then inserted and 10 mm of distal clavicle was resected taking care to avoid damage to the capsule around the joint and avoiding overhanging bone posteriorly.    Rotator cuff repair: We identified on the articular side the leading and supraspinatus tear marked with 0 PDS suture.  Went to the bursal side and cleared the bursal tissue before identifying the suture.  At that point were easily able to probe through the superficial layer of the cuff with a blunt probe.  We performed a takedown noting that is probably 80% intrasubstance involvement of the cuff.  We then cleared the tuberosity and freshened it for healing before placing a medial row 4.75 mm swivel lock and passing its tape sutures.  We brought these over to a lateral row anchor were able to get good purchase and reduction of the tissue.  No dogears were noted.  The incisions were closed with absorbable monocryl and steri strips.  A sterile dressing was placed along with a sling. The patient was awoken from general anesthesia and taken to the PACU in stable condition without complication.   Noemi Chapel, PA-C, present and scrubbed throughout the case, critical for completion in a timely fashion, and for retraction, instrumentation, closure.

## 2020-12-27 NOTE — Transfer of Care (Signed)
Immediate Anesthesia Transfer of Care Note  Patient: Lucas [name].  Procedure(s) Performed: RIGHT SHOULDER ARTHROSCOPY WITH DEBRIDEMENT, DISTAL CLAVICAL EXCISION, ACROMIOPLASTY, BICEP TENODESIS, ROTATOR CUFF REPAIR (Right Shoulder)  Patient Location: PACU  Anesthesia Type:GA combined with regional for post-op pain  Level of Consciousness: drowsy  Airway & Oxygen Therapy: Patient Spontanous Breathing and Patient connected to nasal cannula oxygen  Post-op Assessment: Report given to RN and Post -op Vital signs reviewed and stable  Post vital signs: Reviewed and stable  Last Vitals:  Vitals Value Taken Time  BP    Temp    Pulse 64 12/27/20 1058  Resp 0 12/27/20 1058  SpO2 98 % 12/27/20 1058  Vitals shown include unvalidated device data.  Last Pain:  Vitals:   12/27/20 0802  TempSrc: Oral  PainSc: 2       Patients Stated Pain Goal: 5 (90/30/14 9969)  Complications: No complications documented.

## 2020-12-27 NOTE — Anesthesia Procedure Notes (Signed)
Anesthesia Regional Block: Interscalene brachial plexus block   Pre-Anesthetic Checklist: ,, timeout performed, Correct Patient, Correct Site, Correct Laterality, Correct Procedure, Correct Position, site marked, Risks and benefits discussed,  Surgical consent,  Pre-op evaluation,  At surgeon's request and post-op pain management  Laterality: Right  Prep: chloraprep       Needles:  Injection technique: Single-shot  Needle Type: Echogenic Stimulator Needle     Needle Length: 10cm  Needle Gauge: 21     Additional Needles:   Procedures:,,,, ultrasound used (permanent image in chart),,,,  Narrative:  Start time: 12/27/2020 8:10 AM End time: 12/27/2020 8:15 AM Injection made incrementally with aspirations every 5 mL.  Performed by: Personally  Anesthesiologist: Merlinda Frederick, MD  Additional Notes: Standard monitors applied. Skin prepped. Good needle visualization with ultrasound. Injection made in 5cc increments with no resistance to injection. Patient tolerated the procedure well.

## 2021-01-08 ENCOUNTER — Ambulatory Visit: Payer: 59 | Admitting: Family Medicine

## 2021-01-08 ENCOUNTER — Encounter: Payer: Self-pay | Admitting: Family Medicine

## 2021-01-08 VITALS — BP 132/80 | HR 69 | Ht 70.0 in | Wt 270.8 lb

## 2021-01-08 DIAGNOSIS — G4733 Obstructive sleep apnea (adult) (pediatric): Secondary | ICD-10-CM | POA: Diagnosis not present

## 2021-01-08 DIAGNOSIS — I2510 Atherosclerotic heart disease of native coronary artery without angina pectoris: Secondary | ICD-10-CM | POA: Diagnosis not present

## 2021-01-08 DIAGNOSIS — I480 Paroxysmal atrial fibrillation: Secondary | ICD-10-CM | POA: Diagnosis not present

## 2021-01-08 DIAGNOSIS — R609 Edema, unspecified: Secondary | ICD-10-CM

## 2021-01-08 DIAGNOSIS — R635 Abnormal weight gain: Secondary | ICD-10-CM

## 2021-01-08 NOTE — Progress Notes (Signed)
Electrophysiology Office Note   Date:  01/08/2021   ID:  Lucas Salmons., DOB 1956/09/07, MRN [id number]  PCP:  Glenda Chroman, MD  Cardiologist: Carlyle Dolly Primary Electrophysiologist:     Chief Complaint: Weight gain History of Present Illness: Lucas Marszalek. is a 65 y.o. male who is being seen today for the evaluation of AF at the request of Vyas, Dhruv B, MD. Presenting today for electrophysiology evaluation.  He has a history significant for coronary artery disease, hypertension, diabetes, typical atrial flutter status post ablation in 2017, and paroxysmal atrial fibrillation.  He was seen in the emergency room 04/10/2020 with chest pain was found to be in and out of atrial fibrillation.  He is now status post AF ablation on 08/24/2020.  Here today with complaints of recent weight gain.  States over the last couple days he gained between 8 and 10 pounds.  He has been taking Lasix 20 mg p.o. as needed.  Based on recent weights ranging from late January his weight today is 270.  Previous to weights were 273 respectively on 2 separate occasions.  He denies any shortness of breath, lower extremity edema.  Otherwise he denies any complaints today.  Recently had right shoulder surgery and right arm is in a sling.   Past Medical History:  Diagnosis Date  . Arthritis   . Bilateral carpal tunnel syndrome 08/16/2019  . CAD (coronary artery disease)    a. LHC on 01/29/16 which revealed significant apical LAD stenosis, best treated medically. There was moderate disease in the D2 and mid nondominant RCA.[name] No PCI performed b. 03/2018: repeat cath showing regression of his LAD stenosis now being at 50% with 60% mid RCA stenosis, 45% ostial LPDA stenosis, and 45% ostial second diagonal stenosis.  . Cataract   . Chronic back pain   . Diabetes mellitus    type 2  . GERD (gastroesophageal reflux disease)   . Heart disease   . Hyperlipidemia   . Hypertension   . Obesity   . PAF (paroxysmal  atrial fibrillation) ([place]) 01/2016  . Sleep apnea    has CPAP machine but not wear  . Tubular adenoma of colon 08/2012  . 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 ANKLE SURGERY Left 2012   tendon repair  . ATRIAL FIBRILLATION ABLATION [name] 08/24/2020   Procedure: ATRIAL FIBRILLATION ABLATION;  Surgeon: Constance Haw, MD;  Location: [place] [facility];  Service: Cardiovascular;  Laterality: [name];  . CARDIAC CATHETERIZATION [name] 01/29/2016   Procedure: Left Heart Cath and Coronary Angiography;  Surgeon: [name], MD;  Location: [place] [facility];  Service: Cardiovascular;  Laterality: [name];  . CARPAL TUNNEL RELEASE Right 09/06/2019   Procedure: CARPAL TUNNEL RELEASE;  Surgeon: Daryll Brod, MD;  Location: [facility];  Service: Orthopedics;  Laterality: Right;  . CARPAL TUNNEL RELEASE Left 10/11/2019   Procedure: LEFT CARPAL TUNNEL RELEASE;  Surgeon: Daryll Brod, MD;  Location: [facility];  Service: Orthopedics;  Laterality: Left;  IV REGIONAL FOREARM BLOCK  . CATARACT EXTRACTION W/PHACO  11/11/2012   Procedure: CATARACT EXTRACTION PHACO AND INTRAOCULAR LENS PLACEMENT (IOC);  Surgeon: [name], MD;  Location: [facility];  Service: Ophthalmology;  Laterality: Right;  CDE:  5.56  . CATARACT EXTRACTION W/PHACO Left 12/15/2013   Procedure: CATARACT EXTRACTION PHACO AND INTRAOCULAR LENS PLACEMENT (IOC);  Surgeon: [name], MD;  Location:  [facility];  Service: Ophthalmology;  Laterality: Left;  CDE:13.36  . CHOLECYSTECTOMY    . CHONDROPLASTY Right 05/28/2017   Procedure: RIGHT KNEE CHONDROPLASTY;  Surgeon: [name], MD;  Location: [facility];  Service: Orthopedics;  Laterality: Right;  . COLONOSCOPY    . CYSTOSCOPY W/ URETERAL STENT PLACEMENT     right  . ELECTROPHYSIOLOGIC STUDY [name] 06/02/2016   Procedure: A-Flutter Ablation;  Surgeon: [name], MD;  Location: [facility];  Service: Cardiovascular;  Laterality: [name];  . KNEE ARTHROSCOPY WITH MEDIAL MENISECTOMY Right 05/28/2017   Procedure: KNEE ARTHROSCOPY WITH MEDIAL MENISECTOMY WITH EXTENSIVE SYNOVECTOMY;  Surgeon: [name], MD;  Location: [place];  Service: Orthopedics;  Laterality: Right;  . LEFT HEART CATH AND CORONARY ANGIOGRAPHY [name] 03/30/2018   Procedure: LEFT HEART CATH AND CORONARY ANGIOGRAPHY;  Surgeon: Leonie Man, MD;  Location: [place] [facility];  Service: Cardiovascular;  Laterality: [name];  . LEFT HEART CATH AND CORONARY ANGIOGRAPHY [name] 07/27/2020   Procedure: LEFT HEART CATH AND CORONARY ANGIOGRAPHY;  Surgeon: Sherren Mocha, MD;  Location: [place] [facility];  Service: Cardiovascular;  Laterality: [name];  . left knee sugery Left    Arthroscopy  . NASAL SINUS SURGERY    . POLYPECTOMY    . TEE WITHOUT CARDIOVERSION [name] 08/23/2020   Procedure: TRANSESOPHAGEAL ECHOCARDIOGRAM (TEE);  Surgeon: Acie Fredrickson Wonda Cheng, MD;  Location: [facility];  Service: Cardiovascular;  Laterality: [name];  . TONSILLECTOMY       Current Outpatient Medications  Medication Sig Dispense Refill  . acetaminophen (TYLENOL) 500 MG tablet Take 2 tablets (1,000 mg total) by mouth every 8 (eight) hours for 14 days. 84 tablet 0  . atorvastatin (LIPITOR) 80 MG tablet TAKE 1 TABLET (80 MG TOTAL) BY MOUTH DAILY AT 6 PM. 90 tablet 1  . diltiazem (CARDIZEM CD) 240 MG 24 hr capsule Take 1 capsule (240 mg total) by mouth every evening. 90 capsule 1  . furosemide (LASIX) 20 MG tablet Take 20 mg by mouth daily as needed for fluid.     [name] gabapentin (NEURONTIN) 300 MG capsule Take 1 capsule (300 mg total) by mouth 3 (three) times daily. 90 capsule 0  . glimepiride (AMARYL) 4 MG tablet Take 4 mg by mouth 2 (two) times daily.     . isosorbide mononitrate (IMDUR) 120 MG 24 hr tablet TAKE 1 TABLET BY MOUTH EVERY DAY 90 tablet 1  . metoprolol succinate (TOPROL-XL) 100 MG 24 hr tablet TAKE 1 AND 1/2 TABLETS BY MOUTH  DAILY WITH OR IMMEDIATELY FOLLOWING A MEAL 135 tablet 3  . MULTAQ 400 MG tablet TAKE 1 TABLET (400 MG TOTAL) BY MOUTH 2 (TWO) TIMES DAILY WITH A MEAL. 60 tablet 11  . nitroGLYCERIN (NITROSTAT) 0.4 MG SL tablet Place 1 tablet (0.4 mg total) under the tongue every 5 (five) minutes as needed for chest pain. 25 tablet 3  . oxycodone (OXY-IR) 5 MG capsule Take 5 mg by mouth every 6 (six) hours as needed.    [name] OZEMPIC, 0.25 OR 0.5 MG/DOSE, 2 MG/1.5ML SOPN Inject 0.5 mg into the skin every Saturday.     . pantoprazole (PROTONIX) 40 MG tablet Take 40 mg by mouth daily before breakfast.     . SYNJARDY XR 12.03-999 MG TB24 Take 1 tablet by mouth in the morning and at bedtime.    [name] 20 MG TABS tablet TAKE 1 TABLET (20 MG TOTAL) BY MOUTH DAILY WITH SUPPER. [place]  tablet 3   No current facility-administered medications for this visit.   Facility-Administered Medications Ordered in Other Visits  Medication Dose Route Frequency Provider Last Rate Last Admin  . 0.9 %  sodium chloride infusion   Intravenous Continuous PRN Trinna Post., CRNA   New Bag at 08/31/20 0745    Allergies:   Bee venom and Codeine   Social History:  The patient  reports that he has never smoked. He has never used smokeless tobacco. He reports current alcohol use. He reports that he does not use drugs.   Family History:  The patient's family history includes Aneurysm in his maternal grandmother and mother; Cirrhosis in his father; Diabetes in his sister; Heart attack in his paternal grandmother and sister; Heart murmur in his mother; Hypertension in his mother; Stroke in his maternal grandmother and sister.    ROS:  Please see the history of present illness.   Otherwise, review of systems is positive for none.   All other systems are reviewed and negative.   PHYSICAL EXAM: VS:  BP 132/80   Pulse 69   Ht 5\' 10"  (1.778 m)   Wt 270 lb 12.8 oz (122.8 kg)   SpO2 97%   BMI 38.86 kg/m  , BMI Body mass index is 38.86 kg/m.     GEN: Obese well nourished, well developed, in no acute distress  HEENT: normal  Neck: no JVD, carotid bruits, or masses Cardiac: RRR; no murmurs, rubs, or gallops,no edema  Respiratory:  clear to auscultation bilaterally, normal work of breathing MS: no deformity or atrophy  Skin: warm and dry Neuro:  Strength and sensation are intact Psych: euthymic mood, full affect  EKG: Recent Labs: 05/09/2020: ALT 35; B Natriuretic Peptide 148.0 07/04/2020: Magnesium 1.7; TSH 4.434 08/22/2020: Hemoglobin 15.1; Platelets 178 12/21/2020: BUN 18; Creatinine, Ser 1.09; Potassium 4.6; Sodium 138    Lipid Panel     Component Value Date/Time   CHOL 195 01/29/2016 0109   TRIG 133 01/29/2016 0109   HDL 39 (L) 01/29/2016 0109   CHOLHDL 5.0 01/29/2016 0109   VLDL 27 01/29/2016 0109   LDLCALC 129 (H) 01/29/2016 0109     Wt Readings from Last 3 Encounters:  01/08/21 270 lb 12.8 oz (122.8 kg)  12/27/20 268 lb 4.8 oz (121.7 kg)  12/06/20 273 lb (123.8 kg)      Other studies Reviewed:   Atrial fibrillation ablation 08/24/2020 Dr. Curt Bears CONCLUSIONS: 1. Sinus rhythm upon presentation.   2. Successful electrical isolation and anatomical encircling of all four pulmonary veins with radiofrequency current.  A WACA approach was used 3. Additional left atrial ablation was performed with a standard box lesion created along the posterior wall of the left atrium 4. Atrial fibrillation successfully cardioverted to sinus rhythm. 5. No early apparent complications.   Cardiac catheterization 07/27/2020  1.  Left dominant coronary anatomy with diffuse nonobstructive coronary artery disease, no targets for PCI. 2.  Small vessel disease involving the distal circumflex branch and a nondominant RCA 3.  Normal LVEDP  There is no significant LAD stenosis present.  The vessel is diffusely irregular without high-grade stenosis.  The proximal circumflex and its major branches also have no obstructive disease.   Recommend ongoing medical therapy.  Okay to start rivaroxaban back tonight.  Diagnostic Dominance: Left          Echocardiogram 08/23/2020 IMPRESSIONS    1. Left ventricular ejection fraction, by estimation, is 55 to 60%. The  left ventricle has normal function.  2. Right ventricular systolic function is normal. The right ventricular  size is normal.  3. No left atrial/left atrial appendage thrombus was detected.  4. The mitral valve is normal in structure. No evidence of mitral valve  regurgitation. No evidence of mitral stenosis.  5. The aortic valve is normal in structure. Aortic valve regurgitation is  not visualized. No aortic stenosis is present.     TTE 05/09/20  Review of the above records today demonstrates:  1. Left ventricular ejection fraction, by estimation, is 60 to 65%. The  left ventricle has normal function. The left ventricle has no regional  wall motion abnormalities. There is moderate left ventricular hypertrophy.  Left ventricular diastolic  parameters are indeterminate.  2. Right ventricular systolic function is normal. The right ventricular  size is normal. There is normal pulmonary artery systolic pressure.  3. The mitral valve is normal in structure. No evidence of mitral valve  regurgitation. No evidence of mitral stenosis.  4. The aortic valve is tricuspid. Aortic valve regurgitation is not  visualized. No aortic stenosis is present.  5. The inferior vena cava is normal in size with greater than 50%  respiratory variability, suggesting right atrial pressure of 3 mmHg.    ASSESSMENT AND PLAN:  1.  Paroxysmal atrial fibrillation:  CHA2DS2-VASc of 4 on Xarelto and Multaq.    Is now status post AF ablation 11/25/2019.  He has  remained in sinus rhythm.  Continue current medications no changes.  Continue diltiazem CD 240 mg daily.  Continue Multaq 400 mg p.o. twice daily.  Continue Xarelto 20 mg daily.  2.  Coronary artery disease: No  current chest pain.  Continue continue Imdur 120 mg daily.  Continue sublingual nitroglycerin as needed.  Continue atorvastatin 80 mg daily.  Continue Toprol-XL 150 mg daily.  3.  Obstructive sleep apnea: CPAP compliance encouraged.   4.  Weight gain/edema. Complained of recent weight gain of 8 to 10 pounds at home on his scales.  It appears on our scales over the last 3 measurements in epic his weight is down to 270 pounds from a previous 2 weights of 273 on 2 separate occasions.  He continues with Lasix as needed daily for fluid.  Current medicines are reviewed at length with the patient today.   The patient does not have concerns regarding his medicines.  The following changes were made today: None  Labs/ tests ordered today include:  No orders of the defined types were placed in this encounter.    Disposition:   FU with Dr. Harl Bowie or APP 6 months  Signed, Verta Ellen, NP  01/08/2021 10:52 AM     [place] [address] [phone] (office) [phone] (fax)

## 2021-01-08 NOTE — Patient Instructions (Signed)
Medication Instructions:  Continue all current medications.   Labwork: none  Testing/Procedures: none  Follow-Up: 6 months   Any Other Special Instructions Will Be Listed Below (If Applicable).   If you need a refill on your cardiac medications before your next appointment, please call your pharmacy.  

## 2021-01-21 ENCOUNTER — Other Ambulatory Visit (HOSPITAL_COMMUNITY): Payer: Self-pay | Admitting: Physician Assistant

## 2021-03-17 ENCOUNTER — Other Ambulatory Visit: Payer: Self-pay | Admitting: Cardiology

## 2021-03-18 ENCOUNTER — Encounter: Payer: Self-pay | Admitting: Cardiology

## 2021-03-18 ENCOUNTER — Ambulatory Visit: Payer: 59 | Admitting: Cardiology

## 2021-03-18 ENCOUNTER — Other Ambulatory Visit: Payer: Self-pay

## 2021-03-18 VITALS — BP 128/64 | HR 59 | Ht 70.0 in | Wt 248.0 lb

## 2021-03-18 DIAGNOSIS — I480 Paroxysmal atrial fibrillation: Secondary | ICD-10-CM | POA: Diagnosis not present

## 2021-03-18 NOTE — Patient Instructions (Signed)
Medication Instructions:  Your physician has recommended you make the following change in your medication:  1. STOP Multaq  *If you need a refill on your cardiac medications before your next appointment, please call your pharmacy*   Lab Work: None ordered   Testing/Procedures: None ordered   Follow-Up: At [facility], you and your health needs are our priority.  As part of our continuing mission to provide you with exceptional heart care, we have created designated Provider Care Teams.  These Care Teams include your primary Cardiologist (physician) and Advanced Practice Providers (APPs -  Physician Assistants and Nurse Practitioners) who all work together to provide you with the care you need, when you need it.    Your next appointment:   6 month(s)  The format for your next appointment:   In Person  Provider:   Allegra Lai, MD    Thank you for choosing [facility]!!   Trinidad Curet, RN [phone]

## 2021-03-18 NOTE — Progress Notes (Signed)
Electrophysiology Office Note   Date:  03/18/2021   ID:  Lucas [name]., DOB [birth year], MRN [id number]  PCP:  Glenda Chroman, MD  Cardiologist: Carlyle Dolly Primary Electrophysiologist:  Britnee Mcdevitt Meredith Leeds, MD    Chief Complaint: AF   History of Present Illness: Lucas [name]. is a 65 y.o. male who is being seen today for the evaluation of AF at the request of Jerene Bears B, MD. Presenting today for electrophysiology evaluation.  He has a history significant for coronary artery disease, hypertension, diabetes, typical atrial flutter status post ablation in 2017 and paroxysmal atrial fibrillation.  He was seen in the emergency room 04/10/2020 with chest pain and was found to be in and out of atrial fibrillation.  He is now status post ablation 08/24/2020.  Today, denies symptoms of palpitations, chest pain, shortness of breath, orthopnea, PND, lower extremity edema, claudication, dizziness, presyncope, syncope, bleeding, or neurologic sequela. The patient is tolerating medications without difficulties.  He is currently feeling well.  He has no chest pain or shortness of breath.  Is able do all of his daily activities.  He has noted no further episodes of atrial fibrillation.  He had shoulder surgery 12 weeks ago, and remained in normal rhythm throughout the procedure.  He is continuing to go through rehab.   Past Medical History:  Diagnosis Date  . Arthritis   . Bilateral carpal tunnel syndrome 08/16/2019  . CAD (coronary artery disease)    a. LHC on 01/29/16 which revealed significant apical LAD stenosis, best treated medically. There was moderate disease in the D2 and mid nondominant RCA.[name] No PCI performed b. 03/2018: repeat cath showing regression of his LAD stenosis now being at 50% with 60% mid RCA stenosis, 45% ostial LPDA stenosis, and 45% ostial second diagonal stenosis.  . Cataract   . Chronic back pain   . Diabetes mellitus    type 2  . GERD (gastroesophageal reflux  disease)   . Heart disease   . Hyperlipidemia   . Hypertension   . Obesity   . PAF (paroxysmal atrial fibrillation) ([place]) 01/2016  . Sleep apnea    has CPAP machine but not wear  . Tubular adenoma of colon 08/2012  . 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 ANKLE SURGERY Left 2012   tendon repair  . ATRIAL FIBRILLATION ABLATION [name] 08/24/2020   Procedure: ATRIAL FIBRILLATION ABLATION;  Surgeon: Constance Haw, MD;  Location: [facility];  Service: Cardiovascular;  Laterality: [name];  . CARDIAC CATHETERIZATION [name] 01/29/2016   Procedure: Left Heart Cath and Coronary Angiography;  Surgeon: [name], MD;  Location: [facility];  Service: Cardiovascular;  Laterality: [name];  . CARPAL TUNNEL RELEASE Right 09/06/2019   Procedure: CARPAL TUNNEL RELEASE;  Surgeon: Daryll Brod, MD;  Location: [facility];  Service: Orthopedics;  Laterality: Right;  . CARPAL TUNNEL RELEASE Left 10/11/2019   Procedure: LEFT CARPAL TUNNEL RELEASE;  Surgeon: Daryll Brod, MD;  Location: [facility];  Service: Orthopedics;  Laterality: Left;  IV REGIONAL FOREARM BLOCK  . CATARACT EXTRACTION W/PHACO  11/11/2012   Procedure: CATARACT EXTRACTION PHACO AND INTRAOCULAR LENS PLACEMENT (IOC);  Surgeon: [name], MD;  Location: [facility];  Service: Ophthalmology;  Laterality: Right;  CDE:  5.56  . CATARACT EXTRACTION W/PHACO Left 12/15/2013   Procedure: CATARACT EXTRACTION PHACO AND INTRAOCULAR LENS PLACEMENT (IOC);  Surgeon:  [name], MD;  Location: [facility];  Service: Ophthalmology;  Laterality: Left;  CDE:13.36  . CHOLECYSTECTOMY    . CHONDROPLASTY Right 05/28/2017   Procedure: RIGHT KNEE CHONDROPLASTY;  Surgeon: [name], MD;  Location: [facility];  Service: Orthopedics;  Laterality: Right;  . COLONOSCOPY    . CYSTOSCOPY W/ URETERAL STENT PLACEMENT     right  . ELECTROPHYSIOLOGIC STUDY [name]  06/02/2016   Procedure: A-Flutter Ablation;  Surgeon: Rien Marland Meredith Leeds, MD;  Location: [facility];  Service: Cardiovascular;  Laterality: [name];  . KNEE ARTHROSCOPY WITH MEDIAL MENISECTOMY Right 05/28/2017   Procedure: KNEE ARTHROSCOPY WITH MEDIAL MENISECTOMY WITH EXTENSIVE SYNOVECTOMY;  Surgeon: [name], MD;  Location: [facility];  Service: Orthopedics;  Laterality: Right;  . LEFT HEART CATH AND CORONARY ANGIOGRAPHY [name] 03/30/2018   Procedure: LEFT HEART CATH AND CORONARY ANGIOGRAPHY;  Surgeon: Leonie Man, MD;  Location: [facility];  Service: Cardiovascular;  Laterality: [name];  . LEFT HEART CATH AND CORONARY ANGIOGRAPHY [name] 07/27/2020   Procedure: LEFT HEART CATH AND CORONARY ANGIOGRAPHY;  Surgeon: Sherren Mocha, MD;  Location: [facility];  Service: Cardiovascular;  Laterality: [name];  . left knee sugery Left    Arthroscopy  . NASAL SINUS SURGERY    . POLYPECTOMY    . TEE WITHOUT CARDIOVERSION [name] 08/23/2020   Procedure: TRANSESOPHAGEAL ECHOCARDIOGRAM (TEE);  Surgeon: Acie Fredrickson Wonda Cheng, MD;  [facility]: [facility];  Service: Cardiovascular;  Laterality: [name];  . TONSILLECTOMY       Current Outpatient Medications  Medication Sig Dispense Refill  . atorvastatin (LIPITOR) 80 MG tablet TAKE 1 TABLET (80 MG TOTAL) BY MOUTH DAILY AT 6 PM. 90 tablet 1  . diltiazem (CARDIZEM CD) 240 MG 24 hr capsule TAKE 1 CAPSULE BY MOUTH EVERY EVENING 90 capsule 3  . furosemide (LASIX) 20 MG tablet Take 20 mg by mouth daily as needed for fluid.     [name] glimepiride (AMARYL) 4 MG tablet Take 4 mg by mouth 2 (two) times daily.     . isosorbide mononitrate (IMDUR) 120 MG 24 hr tablet TAKE 1 TABLET BY MOUTH EVERY DAY 90 tablet 1  . metoprolol succinate (TOPROL-XL) 100 MG 24 hr tablet TAKE 1 AND 1/2 TABLETS BY MOUTH DAILY WITH OR IMMEDIATELY FOLLOWING A MEAL 135 tablet 3  . nitroGLYCERIN (NITROSTAT) 0.4 MG SL tablet Place 1 tablet (0.4 mg total) under the tongue every 5  (five) minutes as needed for chest pain. 25 tablet 3  . oxycodone (OXY-IR) 5 MG capsule Take 5 mg by mouth every 6 (six) hours as needed.    [name] OZEMPIC, 0.25 OR 0.5 MG/DOSE, 2 MG/1.5ML SOPN Inject 0.5 mg into the skin every Saturday.     . pantoprazole (PROTONIX) 40 MG tablet Take 40 mg by mouth daily before breakfast.     . SYNJARDY XR 12.03-999 MG TB24 Take 1 tablet by mouth in the morning and at bedtime.    [name] 20 MG TABS tablet TAKE 1 TABLET (20 MG TOTAL) BY MOUTH DAILY WITH SUPPER. 30 tablet 3  . gabapentin (NEURONTIN) 300 MG capsule Take 1 capsule (300 mg total) by mouth 3 (three) times daily. 90 capsule 0   No current facility-administered medications for this visit.   Facility-Administered Medications Ordered in Other Visits  Medication Dose Route Frequency Provider Last Rate Last Admin  . 0.9 %  sodium chloride infusion   Intravenous Continuous PRN Trinna Post., CRNA  New Bag at 08/31/20 0745    Allergies:   Bee venom and Codeine   Social History:  The patient  reports that he has never smoked. He has never used smokeless tobacco. He reports current alcohol use. He reports that he does not use drugs.   Family History:  The patient's family history includes Aneurysm in his maternal grandmother and mother; Cirrhosis in his father; Diabetes in his sister; Heart attack in his paternal grandmother and sister; Heart murmur in his mother; Hypertension in his mother; Stroke in his maternal grandmother and sister.   ROS:  Please see the history of present illness.   Otherwise, review of systems is positive for none.   All other systems are reviewed and negative.   PHYSICAL EXAM: VS:  BP 128/64   Pulse (!) 59   Ht 5\' 10"  (1.610 m)   Wt 248 lb (112.5 kg)   BMI 35.58 kg/m  , BMI Body mass index is 35.58 kg/m. GEN: Well nourished, well developed, in no acute distress  HEENT: normal  Neck: no JVD, carotid bruits, or masses Cardiac: RRR; no murmurs, rubs, or gallops,no edema   Respiratory:  clear to auscultation bilaterally, normal work of breathing GI: soft, nontender, nondistended, + BS MS: no deformity or atrophy  Skin: warm and dry Neuro:  Strength and sensation are intact Psych: euthymic mood, full affect  EKG:  EKG is ordered today. Personal review of the ekg ordered shows sinus rhythm  Recent Labs: 05/09/2020: ALT 35; B Natriuretic Peptide 148.0 07/04/2020: Magnesium 1.7; TSH 4.434 08/22/2020: Hemoglobin 15.1; Platelets 178 12/21/2020: BUN 18; Creatinine, Ser 1.09; Potassium 4.6; Sodium 138    Lipid Panel     Component Value Date/Time   CHOL 195 01/29/2016 0109   TRIG 133 01/29/2016 0109   HDL 39 (L) 01/29/2016 0109   CHOLHDL 5.0 01/29/2016 0109   VLDL 27 01/29/2016 0109   LDLCALC 129 (H) 01/29/2016 0109     Wt Readings from Last 3 Encounters:  03/18/21 248 lb (112.5 kg)  01/08/21 270 lb 12.8 oz (122.8 kg)  12/27/20 268 lb 4.8 oz (121.7 kg)      Other studies Reviewed: Additional studies/ records that were reviewed today include: TTE 05/09/20  Review of the above records today demonstrates:  1. Left ventricular ejection fraction, by estimation, is 60 to 65%. The  left ventricle has normal function. The left ventricle has no regional  wall motion abnormalities. There is moderate left ventricular hypertrophy.  Left ventricular diastolic  parameters are indeterminate.  2. Right ventricular systolic function is normal. The right ventricular  size is normal. There is normal pulmonary artery systolic pressure.  3. The mitral valve is normal in structure. No evidence of mitral valve  regurgitation. No evidence of mitral stenosis.  4. The aortic valve is tricuspid. Aortic valve regurgitation is not  visualized. No aortic stenosis is present.  5. The inferior vena cava is normal in size with greater than 50%  respiratory variability, suggesting right atrial pressure of 3 mmHg.    ASSESSMENT AND PLAN:  1.  Paroxysmal atrial  fibrillation: Currently on Xarelto and Multaq.  CHA2DS2-VASc of 4.  High risk medication monitoring.  As he is remained in sinus rhythm, Kiante Petrovich stop Multaq today.    2.  Coronary artery disease: No current chest pain  3.  Obstructive sleep apnea: CPAP compliance encouraged  4.  Typical atrial flutter: Status post ablation 06/02/2016.   Current medicines are reviewed at length with the patient  today.   The patient does not have concerns regarding his medicines.  The following changes were made today: Stop Multaq  Labs/ tests ordered today include:  Orders Placed This Encounter  Procedures  . EKG 12-Lead     Disposition:   FU with Panda Crossin 6 months  Signed, Diva Lemberger Meredith Leeds, MD  03/18/2021 12:05 PM     [facility] [address] [place] [address] [phone] (office) [phone] (fax)

## 2021-04-01 ENCOUNTER — Other Ambulatory Visit: Payer: Self-pay | Admitting: Cardiology

## 2021-04-01 NOTE — Telephone Encounter (Signed)
Prescription refill request for Xarelto received.  Indication: PAF Last office visit: 01/08/21 with Katina Dung FNP Weight: 122.8kg Age: 65 Scr: 1.09 CrCl: 115.79  Based on above findings Xarelto 20mg  once daily is the appropriate dose.  Refill approved.

## 2021-05-19 IMAGING — MR MR LUMBAR SPINE W/O CM
4 of 5 series · 17 of 48 positions shown · non-contrast
Comparison: 06/30/2017

CLINICAL DATA: Lumbar stenosis.  Back pain worse on the right.

EXAM:
MRI LUMBAR SPINE WITHOUT CONTRAST
TECHNIQUE: Multiplanar, multisequence MR imaging of the lumbar spine was
performed. No intravenous contrast was administered.

[Series 6: T2 · sagittal · 4.0mm · 0.73mm/px · 5 of 19 slices shown (1 of 2)]
[im 1/19]
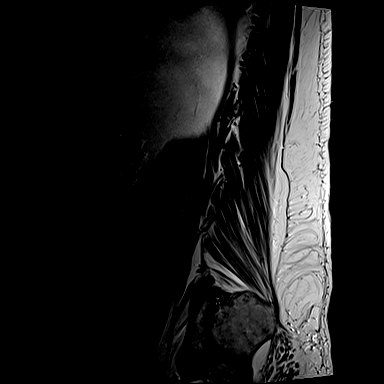
[im 5/19]
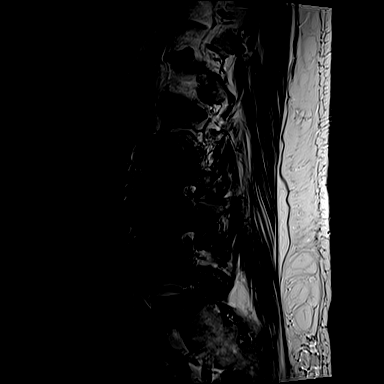
[im 10/19]
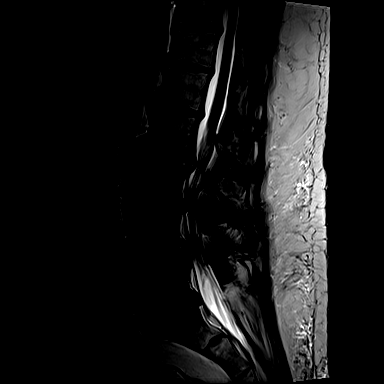
[im 14/19]
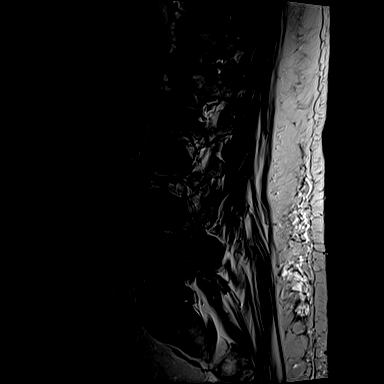
[im 19/19]
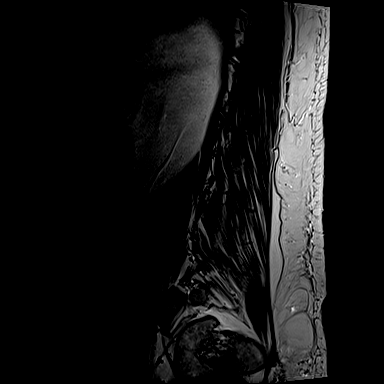

[Series 7: T1 · sagittal · 4.0mm · 0.73mm/px · 3 of 19 slices shown (1 of 2)]
[im 1/19]
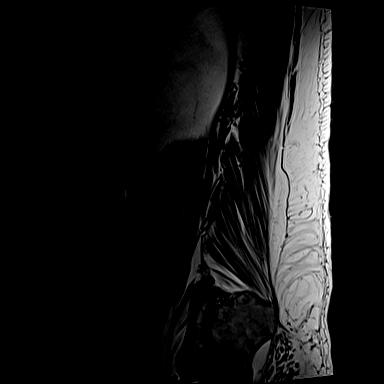
[im 10/19]
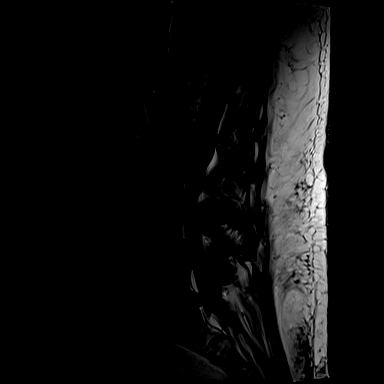
[im 19/19]
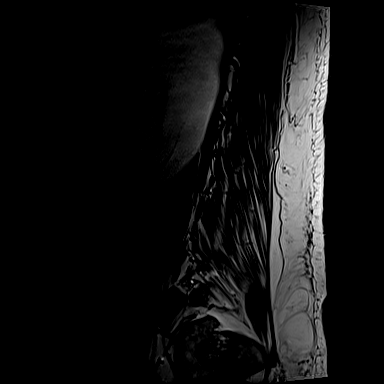

[Series 13: T2 · axial · 4.0mm · 0.28mm/px · z∈[-76,+133]mm · 6 of 52 slices shown (2 of 2)]
[im 4/52]
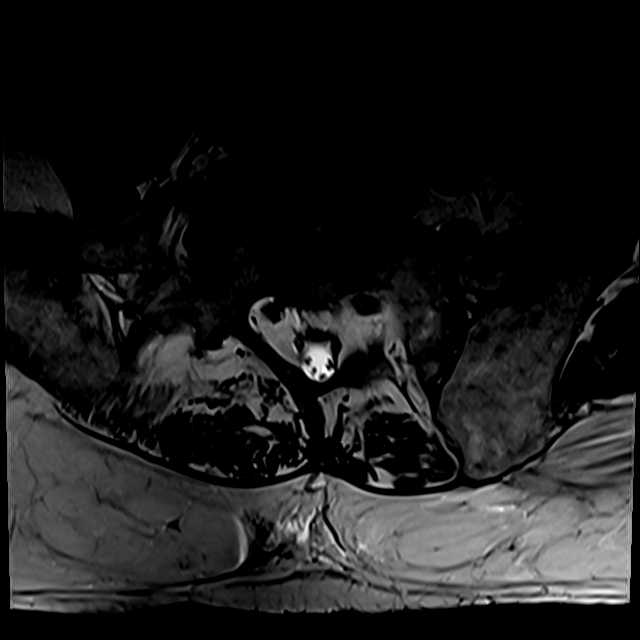
[im 7/52]
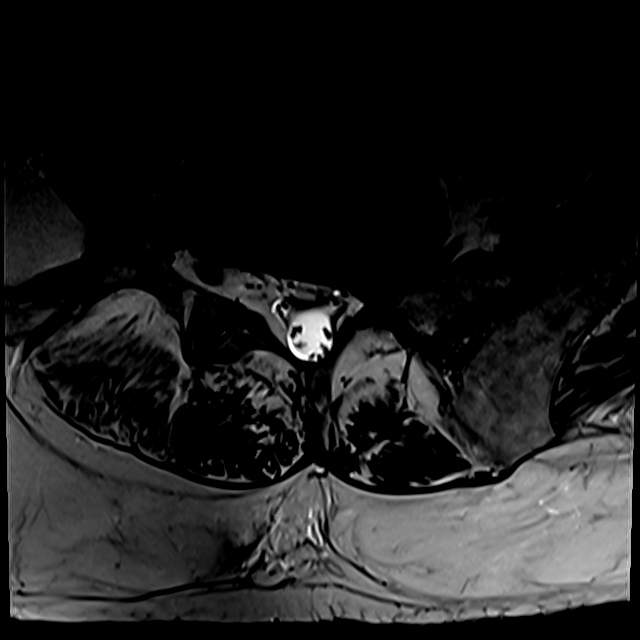
[im 11/52]
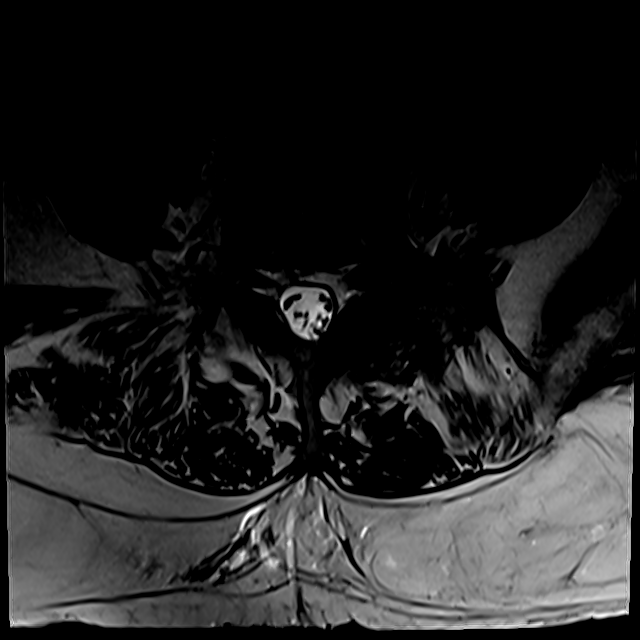
[im 18/52]
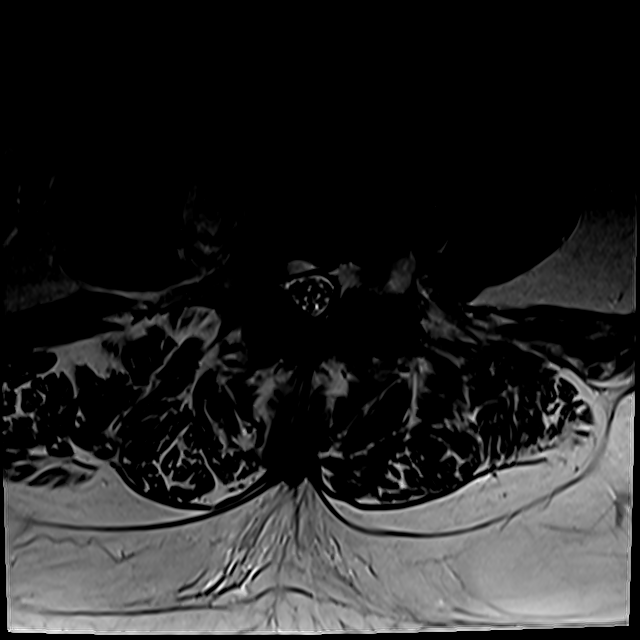
[im 28/52]
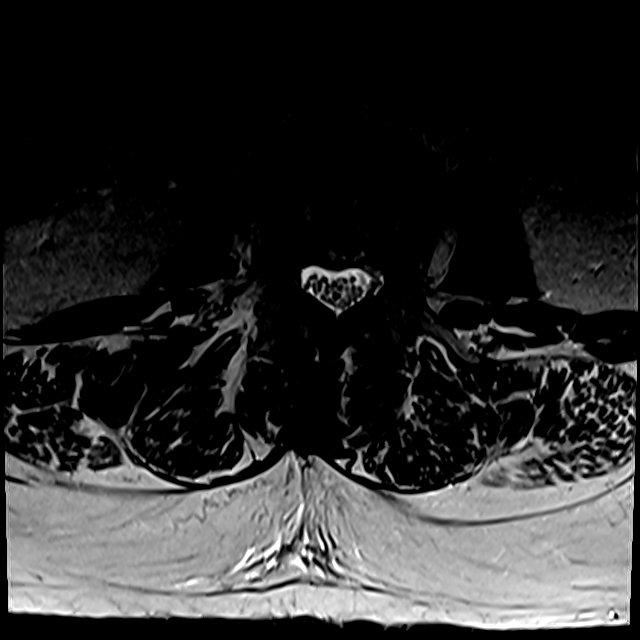
[im 45/52]
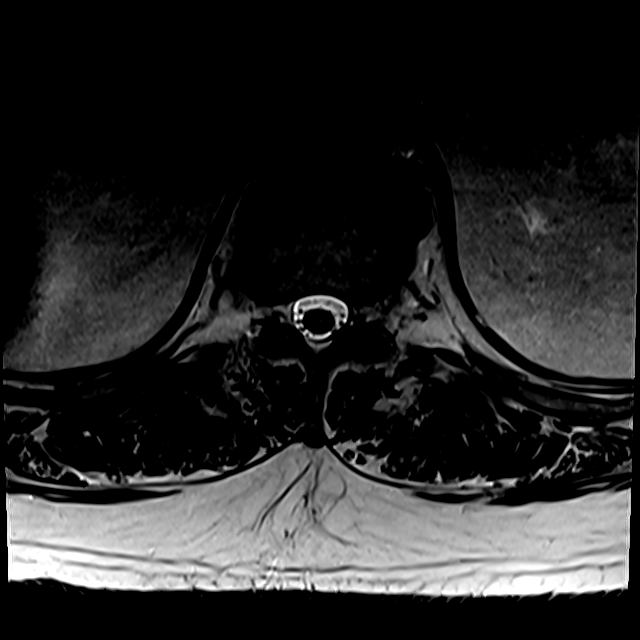

[Series 100: T1 · axial · 4.0mm · 0.28mm/px · z∈[-61,+133]mm · 3 of 52 slices shown (2 of 2)]
[im 7/52]
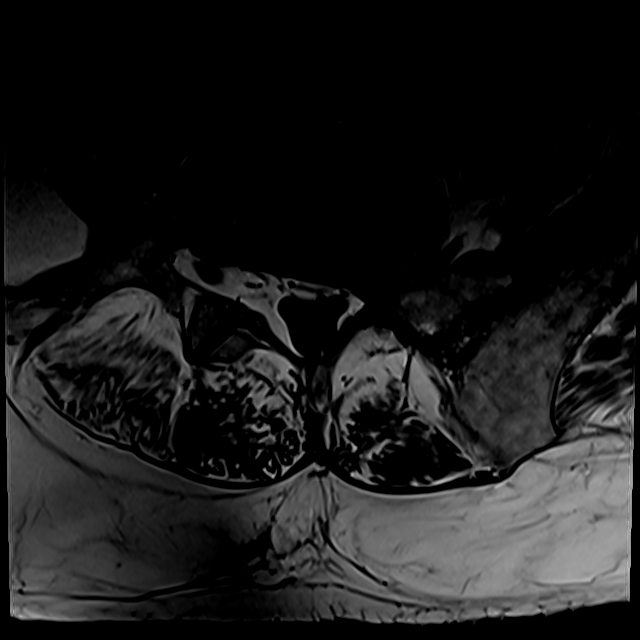
[im 28/52]
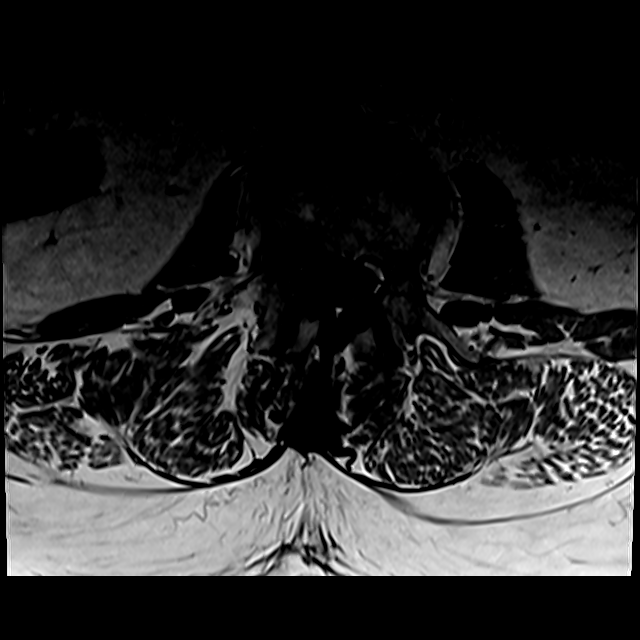
[im 45/52]
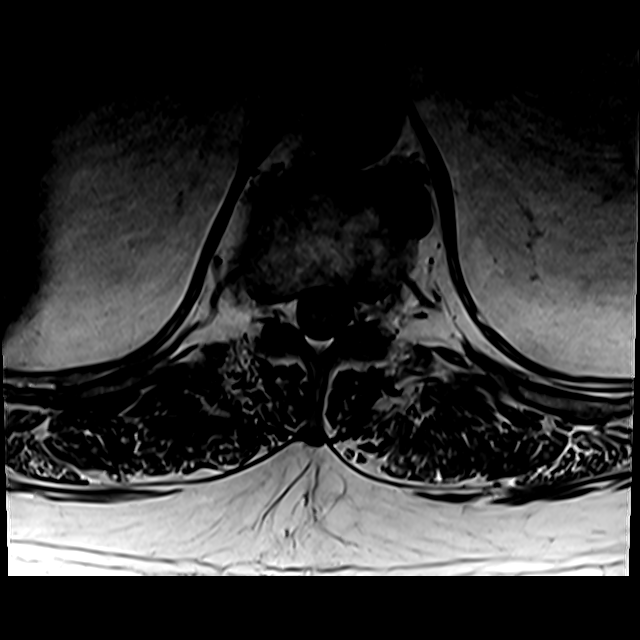

[17 of 48 positions shown; findings below may reference images not displayed]

FINDINGS: Segmentation: 4 lumbar type vertebral bodies with L5 being a
right-sided hemi vertebra, as numbered previously.

Alignment: Mild scoliotic curvature convex to the left with the apex
at L3.

Vertebrae:  No fracture or acquired bone lesion.

Conus medullaris and cauda equina: Conus extends to the L1 level.
Conus and cauda equina appear normal.

Paraspinal and other soft tissues: Negative

Disc levels:

No significant finding at T11-12 or T12-L1.

L1-2: Shallow protrusion of the disc. Previously seen caudally
migrated fragment has desiccated and involuted. Moderate stenosis at
this level with crowding of the nerve roots but no definite focal
neural compression. Degree of stenosis appears similar.

L2-3: Endplate osteophytes and bulging of the disc. Facet and
ligamentous hypertrophy worse on the right. Moderate canal stenosis.
Right lateral recess stenosis that could cause focal neural
compression. Similar appearance to the previous study.

L3-4: Bulging of the disc with right foraminal to extraforaminal
herniation. Bilateral facet and ligamentous hypertrophy. Moderate
stenosis of the central canal. Right lateral recess and foraminal
stenosis that could cause right-sided neural compression. No
apparent change since the previous study.

L[DATE]-S1: L5 is a right-sided hemi vertebra. On the left, L4 relates
to the sacrum and there appears to be fusion at the lateral aspect
of L4 and the sacrum. There is no central canal stenosis in this
region. There is left foraminal stenosis that could affect the
exiting left-sided nerve roots. No apparent change since the
previous exam.
IMPRESSION: 1. 4 lumbar type vertebral bodies with L5 being a right-sided hemi
vertebra, as numbered previously.
2. L1-2: Shallow protrusion of the disc. Previously seen caudally
migrated fragment has desiccated and involuted. Moderate stenosis at
this level with crowding of the nerve roots but no definite focal
neural compression. Degree of stenosis appears similar.
3. L2-3: Endplate osteophytes and bulging of the disc. Facet and
ligamentous hypertrophy worse on the right. Moderate canal stenosis.
Right lateral recess stenosis that could cause focal neural
compression. Similar appearance to the previous study.
4. L3-4: Disc bulge with right foraminal to extraforaminal
herniation. Bilateral facet and ligamentous hypertrophy. Moderate
central canal stenosis. Right lateral recess and foraminal stenosis
that could cause right-sided neural compression. No apparent change
since the previous study.
5. L[DATE]-S1: Right L5 hemi vertebra. Left side of L4 is fused with
the sacrum. Left foraminal stenosis that could affect the exiting
left-sided nerve roots. No apparent change since the previous study.

## 2021-07-02 ENCOUNTER — Other Ambulatory Visit: Payer: Self-pay | Admitting: Cardiology

## 2021-07-25 ENCOUNTER — Ambulatory Visit: Payer: 59 | Admitting: Cardiology

## 2021-08-01 ENCOUNTER — Telehealth: Payer: Self-pay | Admitting: Cardiology

## 2021-08-01 NOTE — Telephone Encounter (Signed)
Patient with diagnosis of afib on Xarelto for anticoagulation.    Procedure: spinal injection Date of procedure: 08/15/21  CHA2DS2-VASc Score = 6  This indicates a 9.7% annual risk of stroke. The patient's score is based upon: CHF History: 0 HTN History: 1 Diabetes History: 1 Stroke History: 2 Vascular Disease History: 1 Age Score: 1 Gender Score: 0   CrCl 42mL/min using adjusted body weight Platelet count 174K  Typically hold Xarelto for 3 days prior to spinal procedures. Given pt's elevated CV risk including hx of afib and prior stroke, will defer to MD for input.

## 2021-08-01 NOTE — Telephone Encounter (Signed)
   [facility] Pre-operative Risk Assessment    Patient Name: Lucas [name].  DOB: 1956/02/06 MRN: [id number]  HEARTCARE STAFF:  - IMPORTANT!!!!!! Under Visit Info/Reason for Call, type in Other and utilize the format Clearance MM/DD/YY or Clearance TBD. Do not use dashes or single digits. - Please review there is not already an duplicate clearance open for this procedure. - If request is for dental extraction, please clarify the # of teeth to be extracted. - If the patient is currently at the dentist's office, call Pre-Op Callback Staff (MA/nurse) to input urgent request.  - If the patient is not currently in the dentist office, please route to the Pre-Op pool.  Request for surgical clearance:  What type of surgery is being performed Transferanial Spinal Injection  When is this surgery scheduled? 08/15/21  What type of clearance is required (medical clearance vs. Pharmacy clearance to hold med vs. Both)?pharmacy  Are there any medications that need to be held prior to surgery and how long? Xarelto 3 days prior  Practice name and name of physician performing surgery? Dr Cherrie Distance  What is the office phone number? [phone]    7.   What is the office fax number? [phone]  8.   Anesthesia type (None, local, MAC, general) ? Not specified   Jannet Askew 08/01/2021, 11:02 AM  _________________________________________________________________   (provider comments below)

## 2021-08-23 ENCOUNTER — Ambulatory Visit: Payer: 59 | Admitting: Cardiology

## 2021-09-11 ENCOUNTER — Ambulatory Visit (INDEPENDENT_AMBULATORY_CARE_PROVIDER_SITE_OTHER): Payer: 59 | Admitting: Cardiology

## 2021-09-11 ENCOUNTER — Encounter: Payer: Self-pay | Admitting: *Deleted

## 2021-09-11 ENCOUNTER — Encounter: Payer: Self-pay | Admitting: Cardiology

## 2021-09-11 VITALS — BP 140/64 | HR 60 | Ht 70.0 in | Wt 267.8 lb

## 2021-09-11 DIAGNOSIS — Z01812 Encounter for preprocedural laboratory examination: Secondary | ICD-10-CM

## 2021-09-11 DIAGNOSIS — I2510 Atherosclerotic heart disease of native coronary artery without angina pectoris: Secondary | ICD-10-CM | POA: Diagnosis not present

## 2021-09-11 DIAGNOSIS — I480 Paroxysmal atrial fibrillation: Secondary | ICD-10-CM | POA: Diagnosis not present

## 2021-09-11 DIAGNOSIS — I712 Thoracic aortic aneurysm, without rupture, unspecified: Secondary | ICD-10-CM

## 2021-09-11 DIAGNOSIS — I10 Essential (primary) hypertension: Secondary | ICD-10-CM | POA: Diagnosis not present

## 2021-09-11 NOTE — Patient Instructions (Addendum)
Medication Instructions:  Continue all current medications.  Labwork: BMET - order given today. Do just prior to CT   Testing/Procedures: CTA of chest Office will contact with results via phone or letter.     Follow-Up: Your physician wants you to follow up in: 6 months.  You will receive a reminder letter in the mail one-two months in advance.  If you don't receive a letter, please call our office to schedule the follow up appointment   Any Other Special Instructions Will Be Listed Below (If Applicable).   If you need a refill on your cardiac medications before your next appointment, please call your pharmacy.

## 2021-09-11 NOTE — Progress Notes (Signed)
Clinical Summary Lucas [name] is a 65 y.o.male seen today for follow up of the following medical problems     1. Aflutter/Afib - CHADS2Vasc score of 2, on xarelto - s/p aflutter ablation 05/2016 by Dr Curt Bears.  01/2019 event monitor: short episode of afib, SR with PACs - toprol was increased to 150mg  daily      - afib ablation 08/2020 - started on multaq, followed by EP clinic  - at 03/2021 since patient had maintained NSR multaq was stopped  - no recent palpitations - compliant with meds. No bleeding on xarelto.     2. CAD - previous chest pain episode occurred in the setting of afib with RVR - cath 01/29/16 with apical LAD disease, treated medically. Moderate RCA and D2 disease.    - 03/2018 cath distal LAD 50%, mid RCA 60%, LPDA 45%, D2 45%, mildly elevated LVEDP of 18    07/2020 cath: 1.  Left dominant coronary anatomy with diffuse nonobstructive coronary artery disease, no targets for PCI. 2.  Small vessel disease involving the distal circumflex Kambrey Hagger and a nondominant RCA 3.  Normal LVEDP  - no specific cardiac chest pains      3. Aortic aneurysm - [facility] 08/2018 CT ordered by pcp showed 4.3 ascending aortic aneurysm - he reports has not had repeat CT since   12/2019 stable 53mm ascending aorta   4. 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     Past Medical History:  Diagnosis Date   Arthritis    Bilateral carpal tunnel syndrome 08/16/2019   CAD (coronary artery disease)    a. LHC on 01/29/16 which revealed significant apical LAD stenosis, best treated medically. There was moderate disease in the D2 and mid nondominant RCA.[name] No PCI performed  b. 03/2018: repeat cath showing regression of his LAD stenosis now being at 50% with 60% mid RCA stenosis, 45% ostial LPDA stenosis, and 45% ostial second diagonal stenosis.   Cataract    Chronic back pain    Diabetes mellitus    type 2    GERD (gastroesophageal reflux disease)    Heart disease    Hyperlipidemia    Hypertension    Obesity    PAF (paroxysmal atrial fibrillation) ([place]) 01/2016   Sleep apnea    has CPAP machine but not wear   Tubular adenoma of colon 08/2012   Ulnar neuropathy at elbow 08/16/2019   Bilateral     Allergies  Allergen Reactions   Bee Venom Swelling   Codeine Nausea Only     Current Outpatient Medications  Medication Sig Dispense Refill   atorvastatin (LIPITOR) 80 MG tablet TAKE 1 TABLET BY MOUTH DAILY AT 6 PM. 90 tablet 1   diltiazem (CARDIZEM CD) 240 MG 24 hr capsule TAKE 1 CAPSULE BY MOUTH EVERY EVENING 90 capsule 3   furosemide (LASIX) 20 MG tablet Take 20 mg by mouth daily as needed for fluid.      gabapentin (NEURONTIN) 300 MG capsule Take 1 capsule (300 mg total) by mouth 3 (three) times daily. 90 capsule 0   glimepiride (AMARYL) 4 MG tablet Take 4 mg by mouth 2 (two) times daily.      isosorbide mononitrate (IMDUR) 120 MG 24 hr tablet TAKE 1 TABLET BY MOUTH EVERY DAY 90 tablet 1   metoprolol succinate (TOPROL-XL) 100 MG 24 hr tablet TAKE  1 AND 1/2 TABLETS BY MOUTH DAILY WITH OR IMMEDIATELY FOLLOWING A MEAL 135 tablet 1   nitroGLYCERIN (NITROSTAT) 0.4 MG SL tablet Place 1 tablet (0.4 mg total) under the tongue every 5 (five) minutes as needed for chest pain. 25 tablet 3   oxycodone (OXY-IR) 5 MG capsule Take 5 mg by mouth every 6 (six) hours as needed.     OZEMPIC, 0.25 OR 0.5 MG/DOSE, 2 MG/1.5ML SOPN Inject 0.5 mg into the skin every Saturday.      pantoprazole (PROTONIX) 40 MG tablet Take 40 mg by mouth daily before breakfast.      SYNJARDY XR 12.03-999 MG TB24 Take 1 tablet by mouth in the morning and at bedtime.     XARELTO 20 MG TABS tablet TAKE 1 TABLET BY MOUTH DAILY WITH SUPPER. 30 tablet 3   No current facility-administered medications for this visit.   Facility-Administered Medications Ordered in Other Visits  Medication Dose Route Frequency Provider Last Rate Last Admin    0.9 %  sodium chloride infusion   Intravenous Continuous PRN Trinna Post., CRNA   New Bag at 08/31/20 0745     Past Surgical History:  Procedure Laterality Date   ANKLE SURGERY Left 2012   tendon repair   ATRIAL FIBRILLATION ABLATION [name] 08/24/2020   Procedure: ATRIAL FIBRILLATION ABLATION;  Surgeon: Constance Haw, MD;  Location: [place] [facility];  Service: Cardiovascular;  Laterality: [name];   CARDIAC CATHETERIZATION [name] 01/29/2016   Procedure: Left Heart Cath and Coronary Angiography;  Surgeon: [name], MD;  Location: [place] [facility];  Service: Cardiovascular;  Laterality: [name];   CARPAL TUNNEL RELEASE Right 09/06/2019   Procedure: CARPAL TUNNEL RELEASE;  Surgeon: Daryll Brod, MD;  Location: [place];  Service: Orthopedics;  Laterality: Right;   CARPAL TUNNEL RELEASE Left 10/11/2019   Procedure: LEFT CARPAL TUNNEL RELEASE;  Surgeon: Daryll Brod, MD;  Location: [place];  Service: Orthopedics;  Laterality: Left;  IV REGIONAL FOREARM BLOCK   CATARACT EXTRACTION W/PHACO  11/11/2012   Procedure: CATARACT EXTRACTION PHACO AND INTRAOCULAR LENS PLACEMENT (IOC);  Surgeon: Tonny Arella Blinder, MD;  Location: [facility];  Service: Ophthalmology;  Laterality: Right;  CDE:  5.56   CATARACT EXTRACTION W/PHACO Left 12/15/2013   Procedure: CATARACT EXTRACTION PHACO AND INTRAOCULAR LENS PLACEMENT (IOC);  Surgeon: Tonny Marytza Grandpre, MD;  Location: [facility];  Service: Ophthalmology;  Laterality: Left;  CDE:13.36   CHOLECYSTECTOMY     CHONDROPLASTY Right 05/28/2017   Procedure: RIGHT KNEE CHONDROPLASTY;  Surgeon: [name], MD;  Location: [place];  Service: Orthopedics;  Laterality: Right;   COLONOSCOPY     CYSTOSCOPY W/ URETERAL STENT PLACEMENT     right   ELECTROPHYSIOLOGIC STUDY [name] 06/02/2016   Procedure: A-Flutter Ablation;  Surgeon: [name], MD;  Location: [place] [facility];  Service: Cardiovascular;  Laterality: [name];   KNEE ARTHROSCOPY  WITH MEDIAL MENISECTOMY Right 05/28/2017   Procedure: KNEE ARTHROSCOPY WITH MEDIAL MENISECTOMY WITH EXTENSIVE SYNOVECTOMY;  Surgeon: [name], MD;  Location: [place];  Service: Orthopedics;  Laterality: Right;   LEFT HEART CATH AND CORONARY ANGIOGRAPHY [name] 03/30/2018   Procedure: LEFT HEART CATH AND CORONARY ANGIOGRAPHY;  Surgeon: Leonie Man, MD;  Location: [place] [facility];  Service: Cardiovascular;  Laterality: [name];   LEFT HEART CATH AND CORONARY ANGIOGRAPHY [name] 07/27/2020   Procedure: LEFT HEART CATH AND CORONARY ANGIOGRAPHY;  Surgeon: Sherren Mocha, MD;  Location: [facility];  Service: Cardiovascular;  Laterality: [name];   left knee sugery Left    Arthroscopy   NASAL SINUS SURGERY     POLYPECTOMY     TEE WITHOUT CARDIOVERSION [name] 08/23/2020   Procedure: TRANSESOPHAGEAL ECHOCARDIOGRAM (TEE);  Surgeon: Acie Fredrickson Wonda Cheng, MD;  Location: [facility];  Service: Cardiovascular;  Laterality: [name];   TONSILLECTOMY       Allergies  Allergen Reactions   Bee Venom Swelling   Codeine Nausea Only      Family History  Problem Relation Age of Onset   Heart murmur Mother    Aneurysm Mother    Hypertension Mother    Cirrhosis Father    Heart attack Sister    Diabetes Sister    Aneurysm Maternal Grandmother    Stroke Maternal Grandmother    Heart attack Paternal Grandmother    Stroke Sister    Colon cancer Neg Hx    Rectal cancer Neg Hx    Stomach cancer Neg Hx      Social History Mr. Youman reports that he has never smoked. He has never used smokeless tobacco. Mr. Stage reports current alcohol use.   Review of Systems CONSTITUTIONAL: No weight loss, fever, chills, weakness or fatigue.  HEENT: Eyes: No visual loss, blurred vision, double vision or yellow sclerae.No hearing loss, sneezing, congestion, runny nose or sore throat.  SKIN: No rash or itching.  CARDIOVASCULAR: per hpi RESPIRATORY: No shortness of breath, cough or sputum.   GASTROINTESTINAL: No anorexia, nausea, vomiting or diarrhea. No abdominal pain or blood.  GENITOURINARY: No burning on urination, no polyuria NEUROLOGICAL: No headache, dizziness, syncope, paralysis, ataxia, numbness or tingling in the extremities. No change in bowel or bladder control.  MUSCULOSKELETAL: No muscle, back pain, joint pain or stiffness.  LYMPHATICS: No enlarged nodes. No history of splenectomy.  PSYCHIATRIC: No history of depression or anxiety.  ENDOCRINOLOGIC: No reports of sweating, cold or heat intolerance. No polyuria or polydipsia.  [name]   Physical Examination Today's Vitals   09/11/21 0823  BP: 140/64  Pulse: 60  SpO2: 97%  Weight: 267 lb 12.8 oz (121.5 kg)  Height: 5\' 10"  (1.778 m)   Body mass index is 38.43 kg/m.  Gen: resting comfortably, no acute distress HEENT: no scleral icterus, pupils equal round and reactive, no palptable cervical adenopathy,  CV: RRR, no mrg, no jvd Resp: Clear to auscultation bilaterally GI: abdomen is soft, non-tender, non-distended, normal bowel sounds, no hepatosplenomegaly MSK: extremities are warm, no edema.  Skin: warm, no rash Neuro:  no focal deficits Psych: appropriate affect   Diagnostic Studies 01/2016 echo Study Conclusions   - Left ventricle: The cavity size was mildly dilated. Wall   thickness was increased in a pattern of mild LVH. Systolic   function was normal. The estimated ejection fraction was in the   range of 55% to 60%.   01/2016 cath Dist LAD lesion, 80% stenosed. Mid RCA lesion, 60% stenosed. Ost 2nd Diag to 2nd Diag lesion, 60% stenosed. 2nd Mrg lesion, 30% stenosed. Mid LAD lesion, 25% stenosed. LPDA lesion, 45% stenosed.   Significant apical LAD stenosis, best treated with medication. Moderate disease in the second diagonal and mid nondominant RCA. Normal left ventricular systolic function. Normal left ventricular filling pressures.   Post Cath Recommendations:    Medical therapy of both  atrial fibrillation and coronary artery disease. Percutaneous intervention is not currently indicated for coronary disease.   03/2018 cath Dist LAD lesion is 50% stenosed. -Demonstrates regression of  disease Mid RCA (nondominant) stable lesion is 60% stenosed. Stable Ost LPDA to LPDA lesion is 45% stenosed. Stable Ost 2nd Diag lesion is 45% stenosed. Demonstrates progression of disease The left ventricular systolic function is normal. The left ventricular ejection fraction is 55-65% by visual estimate. LV end diastolic pressure is mildly elevated.   Angiographically stable to improved coronary disease with notably less significant disease in the diagonal Ammarie Matsuura as well as the apical LAD.   No potential culprit lesion to explain the patient's symptoms. Mildly elevated LVEDP.   Plan:  Discharge home after TR band removal and bedrest.   Restart Xarelto tonight.   01/2019 event monitor 14 day event monitor Min HR 54, Max HR 120, Avg HR 76 Sinus rhythm with PACs, with episode of afib rate 120 Reported symptoms correlated with sinus rhythm and PACs          Assessment and Plan  1. Aflutter/Afib - no recent symptoms, continue current meds   2. CAD - denies any symptoms, continue current meds     3. HTN - elevated but has not taken meds yet, was at goal during EP visit few months ago - continue current meds     4. Aortic aneurysm - has been stable, due for repeat scan.       Arnoldo Lenis, M.D.

## 2021-09-30 ENCOUNTER — Other Ambulatory Visit (HOSPITAL_COMMUNITY)
Admission: RE | Admit: 2021-09-30 | Discharge: 2021-09-30 | Disposition: A | Discharge status: Home / Self Care | Payer: 59 | Source: Ambulatory Visit | Attending: Cardiology | Admitting: Cardiology

## 2021-09-30 ENCOUNTER — Other Ambulatory Visit: Payer: Self-pay

## 2021-09-30 DIAGNOSIS — Z01812 Encounter for preprocedural laboratory examination: Secondary | ICD-10-CM | POA: Diagnosis present

## 2021-09-30 DIAGNOSIS — I10 Essential (primary) hypertension: Secondary | ICD-10-CM | POA: Diagnosis present

## 2021-09-30 LAB — BASIC METABOLIC PANEL
Anion gap: 7 (ref 5–15)
BUN: 11 mg/dL (ref 8–23)
CO2: 24 mmol/L (ref 22–32)
Calcium: 8.9 mg/dL (ref 8.9–10.3)
Chloride: 107 mmol/L (ref 98–111)
Creatinine, Ser: 0.8 mg/dL (ref 0.61–1.24)
GFR, Estimated: >60 mL/min (ref >60)
Glucose, Bld: 176 mg/dL — ABNORMAL HIGH (ref 70–99)
Potassium: 4.1 mmol/L (ref 3.5–5.1)
Sodium: 138 mmol/L (ref 135–145)

## 2021-10-02 ENCOUNTER — Other Ambulatory Visit: Payer: Self-pay

## 2021-10-02 ENCOUNTER — Ambulatory Visit (HOSPITAL_COMMUNITY)
Admission: RE | Admit: 2021-10-02 | Discharge: 2021-10-02 | Disposition: A | Discharge status: Home / Self Care | Payer: 59 | Source: Ambulatory Visit | Attending: Cardiology | Admitting: Cardiology

## 2021-10-02 DIAGNOSIS — I712 Thoracic aortic aneurysm, without rupture, unspecified: Secondary | ICD-10-CM | POA: Insufficient documentation

## 2021-10-02 MED ORDER — IOHEXOL 350 MG/ML SOLN
100.0000 mL | Freq: Once | INTRAVENOUS | Status: AC | PRN
  Administered 2021-10-02: 80 mL via INTRAVENOUS

## 2021-10-07 ENCOUNTER — Telehealth: Payer: Self-pay | Admitting: *Deleted

## 2021-10-07 ENCOUNTER — Encounter: Payer: Self-pay | Admitting: *Deleted

## 2021-10-07 NOTE — Telephone Encounter (Signed)
-----   Message from Arnoldo Lenis, MD sent at 10/07/2021  9:07 AM EST ----- Aneurysm remains mild on CT, continue to monitor  Zandra Abts MD

## 2021-10-07 NOTE — Telephone Encounter (Signed)
Laurine Blazer, LPN  01/65/5374  8:27 PM EST Back to Top    Notified via my chart.  Copy to pcp.

## 2021-10-17 ENCOUNTER — Other Ambulatory Visit: Payer: Self-pay | Admitting: Cardiology

## 2021-10-17 NOTE — Telephone Encounter (Signed)
Prescription refill request for Xarelto received.  Indication: PAF Last office visit: 09/11/21  J Branch MD Weight: 121.5kg Age: 65 Scr: 0.80 on 09/30/21 CrCl: 153.98  Based on above findings Xarelto 20mg daily is the appropriate dose.  Refill approved.  

## 2021-10-27 ENCOUNTER — Other Ambulatory Visit: Payer: Self-pay | Admitting: Cardiology

## 2021-11-04 ENCOUNTER — Emergency Department (HOSPITAL_COMMUNITY): Admission: EM | Admit: 2021-11-04 | Discharge: 2021-11-04 | Discharge status: Against Medical Advice | Payer: 59

## 2021-11-04 NOTE — ED Notes (Signed)
This RN called for pt x3, Informed by front desk pt left prior to being seen.

## 2021-12-19 ENCOUNTER — Other Ambulatory Visit: Payer: Self-pay | Admitting: Cardiology

## 2022-01-10 ENCOUNTER — Other Ambulatory Visit: Payer: Self-pay | Admitting: Cardiology

## 2022-02-19 ENCOUNTER — Other Ambulatory Visit: Payer: Self-pay | Admitting: *Deleted

## 2022-02-19 MED ORDER — DILTIAZEM HCL ER COATED BEADS 240 MG PO CP24: 240.0000 mg | ORAL_CAPSULE | Freq: Every evening | ORAL | 3 refills | Status: DC

## 2022-03-14 ENCOUNTER — Ambulatory Visit: Payer: 59 | Admitting: Cardiology

## 2022-04-02 ENCOUNTER — Other Ambulatory Visit: Payer: Self-pay | Admitting: Cardiology

## 2022-04-18 ENCOUNTER — Ambulatory Visit: Payer: 59 | Admitting: Cardiology

## 2022-05-30 ENCOUNTER — Encounter: Payer: Self-pay | Admitting: Cardiology

## 2022-06-10 ENCOUNTER — Other Ambulatory Visit: Payer: Self-pay | Admitting: Cardiology

## 2022-06-10 ENCOUNTER — Ambulatory Visit: Payer: 59 | Admitting: Cardiology

## 2022-06-16 ENCOUNTER — Other Ambulatory Visit: Payer: Self-pay | Admitting: Cardiology

## 2022-06-16 NOTE — Telephone Encounter (Signed)
Prescription refill request for Xarelto received.  Indication: PAF Last office visit: 09/11/21  Zandra Abts MD Weight: 121.5kg Age: 67 Scr: 0.80 on 09/30/21 CrCl: 153.98  Based on above findings Xarelto '20mg'$  daily is the appropriate dose.  Refill approved.

## 2022-06-24 ENCOUNTER — Ambulatory Visit: Payer: 59 | Admitting: Cardiology

## 2022-06-26 ENCOUNTER — Encounter: Payer: Self-pay | Admitting: Cardiology

## 2022-06-26 ENCOUNTER — Ambulatory Visit: Payer: 59 | Admitting: Cardiology

## 2022-06-26 ENCOUNTER — Encounter: Payer: Self-pay | Admitting: *Deleted

## 2022-06-26 VITALS — BP 120/72 | HR 69 | Ht 70.0 in | Wt 257.8 lb

## 2022-06-26 DIAGNOSIS — I10 Essential (primary) hypertension: Secondary | ICD-10-CM

## 2022-06-26 DIAGNOSIS — I712 Thoracic aortic aneurysm, without rupture, unspecified: Secondary | ICD-10-CM

## 2022-06-26 DIAGNOSIS — I2510 Atherosclerotic heart disease of native coronary artery without angina pectoris: Secondary | ICD-10-CM

## 2022-06-26 DIAGNOSIS — I480 Paroxysmal atrial fibrillation: Secondary | ICD-10-CM | POA: Diagnosis not present

## 2022-06-26 NOTE — Patient Instructions (Signed)
Medication Instructions:  Continue all current medications.   Labwork: none  Testing/Procedures: none  Follow-Up: 6 months   Any Other Special Instructions Will Be Listed Below (If Applicable).   If you need a refill on your cardiac medications before your next appointment, please call your pharmacy.  

## 2022-06-26 NOTE — Progress Notes (Signed)
Clinical Summary Mr. Bergland is a 66 y.o.maleseen today for follow up of the following medical problems     1. Aflutter/Afib - CHADS2Vasc score of 2, on xarelto - s/p aflutter ablation 05/2016 by Dr Curt Bears.  01/2019 event monitor: short episode of afib, SR with PACs - toprol was increased to '150mg'$  daily      - afib ablation 08/2020 - started on multaq, followed by EP clinic  - at 03/2021 since patient had maintained NSR multaq was stopped  - no recent palpitations      2. CAD - previous chest pain episode occurred in the setting of afib with RVR - cath 01/29/16 with apical LAD disease, treated medically. Moderate RCA and D2 disease.    - 03/2018 cath distal LAD 50%, mid RCA 60%, LPDA 45%, D2 45%, mildly elevated LVEDP of 18    07/2020 cath: 1.  Left dominant coronary anatomy with diffuse nonobstructive coronary artery disease, no targets for PCI. 2.  Small vessel disease involving the distal circumflex Adrienna Karis and a nondominant RCA 3.  Normal LVEDP   -isolated discomfort in April, better with NG - compliant with meds       3. Aortic aneurysm - [facility] 08/2018 CT ordered by pcp showed 4.3 ascending aortic aneurysm - he reports has not had repeat CT since   12/2019 stable 20m ascending aorta -09/2021 3.9 cm ascending aortc   4. HTN - compliant with meds, have not taken meds yet     5. 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     Past Medical History:  Diagnosis Date   Arthritis    Bilateral carpal tunnel syndrome 08/16/2019   CAD (coronary artery disease)    a. LHC on 01/29/16 which revealed significant apical LAD stenosis, best treated medically. There was moderate disease in the D2 and mid nondominant RCA..Marland KitchenNo PCI performed  b. 03/2018: repeat cath showing regression of his LAD stenosis now being at 50% with 60% mid RCA stenosis, 45% ostial LPDA stenosis, and 45% ostial second diagonal stenosis.    Cataract    Chronic back pain    Diabetes mellitus    type 2   GERD (gastroesophageal reflux disease)    Heart disease    Hyperlipidemia    Hypertension    Obesity    PAF ([place]ox[place]) [place] 01/2016   Sleep apnea    has CPAP machine but not wear   Tubular adenoma of colon 08/2012   Ulnar neuropathy at elbow 08/16/2019   Bilateral     Allergies  Allergen Reactions   Bee Venom Swelling   Codeine Nausea Only     Current Outpatient Medications  Medication Sig Dispense Refill   atorvastatin (LIPITOR) 80 MG tablet TAKE 1 TABLET BY MOUTH DAILY AT 6 PM. 90 tablet 1   diltiazem (CARDIZEM CD) 240 MG 24 hr capsule Take 1 capsule (240 mg total) by mouth every evening. 90 capsule 3   furosemide (LASIX) 20 MG tablet Take 20 mg by mouth daily as needed for fluid.      gabapentin (NEURONTIN) 300 MG capsule Take 60 mg by mouth 2 (two) times daily.     glimepiride (AMARYL) 4 MG tablet Take 4 mg by mouth 2 (two) times daily.      HYDROcodone-acetaminophen (NORCO/VICODIN) 5-325 MG tablet Take 1 tablet by mouth 2 (two) times daily as needed.  isosorbide mononitrate (IMDUR) 120 MG 24 hr tablet TAKE 1 TABLET BY MOUTH EVERY DAY 90 tablet 2   metoprolol succinate (TOPROL-XL) 100 MG 24 hr tablet TAKE 1 AND 1/2 TABLETS BY MOUTH DAILY WITH OR IMMEDIATELY FOLLOWING A MEAL 135 tablet 1   nitroGLYCERIN (NITROSTAT) 0.4 MG SL tablet Place 1 tablet (0.4 mg total) under the tongue every 5 (five) minutes as needed for chest pain. 25 tablet 3   OZEMPIC, 0.25 OR 0.5 MG/DOSE, 2 MG/1.5ML SOPN Inject 0.5 mg into the skin every Saturday.      pantoprazole (PROTONIX) 40 MG tablet Take 40 mg by mouth daily before breakfast.      SYNJARDY XR 12.03-999 MG TB24 Take 1 tablet by mouth in the morning and at bedtime.     XARELTO 20 MG TABS tablet TAKE 1 TABLET BY MOUTH DAILY WITH SUPPER 30 tablet 5   No current facility-administered medications for this visit.   Facility-Administered Medications Ordered  in Other Visits  Medication Dose Route Frequency Provider Last Rate Last Admin   0.9 %  sodium chloride infusion   Intravenous Continuous PRN Trinna Post., CRNA   New Bag at 08/31/20 0745     Past Surgical History:  Procedure Laterality Date   ANKLE SURGERY Left 2012   tendon repair   ATRIAL FIBRILLATION ABLATION [name] 08/24/2020   Procedure: ATRIAL FIBRILLATION ABLATION;  Surgeon: Constance Haw, MD;  Location: [place] [facility];  Service: Cardiovascular;  Laterality: [name];   CARDIAC CATHETERIZATION [name] 01/29/2016   Procedure: Left Heart Cath and Coronary Angiography;  Surgeon: [name], MD;  Location: [place] [facility];  Service: Cardiovascular;  Laterality: [name];   CARPAL TUNNEL RELEASE Right 09/06/2019   Procedure: CARPAL TUNNEL RELEASE;  Surgeon: Daryll Brod, MD;  Location: [place];  Service: Orthopedics;  Laterality: Right;   CARPAL TUNNEL RELEASE Left 10/11/2019   Procedure: LEFT CARPAL TUNNEL RELEASE;  Surgeon: Daryll Brod, MD;  Location: [place];  Service: Orthopedics;  Laterality: Left;  IV REGIONAL FOREARM BLOCK   CATARACT EXTRACTION W/PHACO  11/11/2012   Procedure: CATARACT EXTRACTION PHACO AND INTRAOCULAR LENS PLACEMENT (IOC);  Surgeon: Tonny Zakhi Dupre, MD;  [facility];  Service: Ophthalmology;  Laterality: Right;  CDE:  5.56   CATARACT EXTRACTION W/PHACO Left 12/15/2013   Procedure: CATARACT EXTRACTION PHACO AND INTRAOCULAR LENS PLACEMENT (IOC);  Surgeon: Tonny Lynzi Meulemans, MD;  [facility]: [facility];  Service: Ophthalmology;  Laterality: Left;  CDE:13.36   CHOLECYSTECTOMY     CHONDROPLASTY Right 05/28/2017   Procedure: RIGHT KNEE CHONDROPLASTY;  Surgeon: [name], MD;  Location: [place];  Service: Orthopedics;  Laterality: Right;   COLONOSCOPY     CYSTOSCOPY W/ URETERAL STENT PLACEMENT     right   ELECTROPHYSIOLOGIC STUDY [name] 06/02/2016   Procedure: A-Flutter Ablation;  Surgeon: [name], MD;  Location: [place] [facility];  Service: Cardiovascular;  Laterality: [name];   KNEE ARTHROSCOPY WITH MEDIAL MENISECTOMY Right 05/28/2017   Procedure: KNEE ARTHROSCOPY WITH MEDIAL MENISECTOMY WITH EXTENSIVE SYNOVECTOMY;  Surgeon: [name], MD;  Location: [place];  Service: Orthopedics;  Laterality: Right;   LEFT HEART CATH AND CORONARY ANGIOGRAPHY [name] 03/30/2018   Procedure: LEFT HEART CATH AND CORONARY ANGIOGRAPHY;  Surgeon: Leonie Man, MD;  Location: [place] [facility];  Service: Cardiovascular;  Laterality: [name];   LEFT HEART CATH AND CORONARY ANGIOGRAPHY [name] 07/27/2020   Procedure: LEFT HEART CATH AND CORONARY  ANGIOGRAPHY;  Surgeon: Sherren Mocha, MD;  Location: [place] [facility];  Service: Cardiovascular;  Laterality: [name];   left knee sugery Left    Arthroscopy   NASAL SINUS SURGERY     POLYPECTOMY     TEE WITHOUT CARDIOVERSION [name] 08/23/2020   Procedure: TRANSESOPHAGEAL ECHOCARDIOGRAM (TEE);  Surgeon: Acie Fredrickson Wonda Cheng, MD;  Location: [facility] ENDOSCOPY;  Service: Cardiovascular;  Laterality: [name];   TONSILLECTOMY       Allergies  Allergen Reactions   Bee Venom Swelling   Codeine Nausea Only      Family History  Problem Relation Age of Onset   Heart murmur Mother    Aneurysm Mother    Hypertension Mother    Cirrhosis Father    Heart attack Sister    Diabetes Sister    Aneurysm Maternal Grandmother    Stroke Maternal Grandmother    Heart attack Paternal Grandmother    Stroke Sister    Colon cancer Neg Hx    Rectal cancer Neg Hx    Stomach cancer Neg Hx      Social History Mr. Fleissner reports that he has never smoked. He has never used smokeless tobacco. Mr. Wronski reports current alcohol use.   Review of Systems CONSTITUTIONAL: No weight loss, fever, chills, weakness or fatigue.  HEENT: Eyes: No visual loss, blurred vision, double vision or yellow sclerae.No hearing loss, sneezing, congestion, runny nose or sore throat.  SKIN: No rash or itching.   CARDIOVASCULAR: per hpi RESPIRATORY: No shortness of breath, cough or sputum.  GASTROINTESTINAL: No anorexia, nausea, vomiting or diarrhea. No abdominal pain or blood.  GENITOURINARY: No burning on urination, no polyuria NEUROLOGICAL: No headache, dizziness, syncope, paralysis, ataxia, numbness or tingling in the extremities. No change in bowel or bladder control.  MUSCULOSKELETAL: No muscle, back pain, joint pain or stiffness.  LYMPHATICS: No enlarged nodes. No history of splenectomy.  PSYCHIATRIC: No history of depression or anxiety.  ENDOCRINOLOGIC: No reports of sweating, cold or heat intolerance. No polyuria or polydipsia.  [name]   Physical Examination Today's Vitals   06/26/22 1331  BP: 120/72  Pulse: 69  SpO2: 95%  Weight: 257 lb 12.8 oz (116.9 kg)  Height: '5\' 10"'$  (9.833 m)   Body mass index is 36.99 kg/m.  Gen: resting comfortably, no acute distress HEENT: no scleral icterus, pupils equal round and reactive, no palptable cervical adenopathy,  CV: RRR, no m/r/g no jvd Resp: Clear to auscultation bilaterally GI: abdomen is soft, non-tender, non-distended, normal bowel sounds, no hepatosplenomegaly MSK: extremities are warm, no edema.  Skin: warm, no rash Neuro:  no focal deficits Psych: appropriate affect   Diagnostic Studies  01/2016 echo Study Conclusions   - Left ventricle: The cavity size was mildly dilated. Wall   thickness was increased in a pattern of mild LVH. Systolic   function was normal. The estimated ejection fraction was in the   range of 55% to 60%.   01/2016 cath Dist LAD lesion, 80% stenosed. Mid RCA lesion, 60% stenosed. Ost 2nd Diag to 2nd Diag lesion, 60% stenosed. 2nd Mrg lesion, 30% stenosed. Mid LAD lesion, 25% stenosed. LPDA lesion, 45% stenosed.   Significant apical LAD stenosis, best treated with medication. Moderate disease in the second diagonal and mid nondominant RCA. Normal left ventricular systolic function. Normal left  ventricular filling pressures.   Post Cath Recommendations:    Medical therapy of both atrial fibrillation and coronary artery disease. Percutaneous intervention is not currently indicated for coronary disease.   03/2018  cath Dist LAD lesion is 50% stenosed. -Demonstrates regression of disease Mid RCA (nondominant) stable lesion is 60% stenosed. Stable Ost LPDA to LPDA lesion is 45% stenosed. Stable Ost 2nd Diag lesion is 45% stenosed. Demonstrates progression of disease The left ventricular systolic function is normal. The left ventricular ejection fraction is 55-65% by visual estimate. LV end diastolic pressure is mildly elevated.   Angiographically stable to improved coronary disease with notably less significant disease in the diagonal Makaya Juneau as well as the apical LAD.   No potential culprit lesion to explain the patient's symptoms. Mildly elevated LVEDP.   Plan:  Discharge home after TR band removal and bedrest.   Restart Xarelto tonight.   01/2019 event monitor 14 day event monitor Min HR 54, Max HR 120, Avg HR 76 Sinus rhythm with PACs, with episode of afib rate 120 Reported symptoms correlated with sinus rhythm and PACs     09/2021 CTA IMPRESSION: 1. The thoracic aorta is not overtly aneurysmal on the current study measuring 3.9 cm in maximum caliber in the ascending segment. 2. Stable dilatation of the main pulmonary artery without significant additional central pulmonary artery dilatation. 3. Stable calcified granuloma in the posterior left lower lobe.  Assessment and Plan  1. Aflutter/Afib/acquired thrombophilia - no symptoms - EKG today show NSR - continue continue current meds including xarelto for stroke prevention   2. CAD - doing well, continue curren tmeds     3. HTN - he is at goal, continue current meds     4. Aortic aneurysm - has been stable - will arrange repeat scan at our next f/u  F/u 6 months       Arnoldo Lenis, M.D.

## 2022-07-29 ENCOUNTER — Other Ambulatory Visit: Payer: Self-pay | Admitting: Cardiology

## 2022-07-31 ENCOUNTER — Telehealth: Payer: Self-pay | Admitting: Cardiology

## 2022-07-31 NOTE — Telephone Encounter (Signed)
   Pre-operative Risk Assessment    Patient Name: Lucas [name].  DOB: June 30, 1956 MRN: [id number]      Request for Surgical Clearance    Procedure:   ESI-Left-C6-C7  Date of Surgery:  Clearance 08/07/22                                 Surgeon:  Dr. Davy Pique Surgeon's Group or Practice Name:  [facility] Phone number:  [phone] Fax number:  [phone]   Type of Clearance Requested:   - Pharmacy:  Hold Rivaroxaban (Xarelto) Discontinue Xarelto for 3 days prior. The patient can resume the blood thinner: the day after his injection   Type of Anesthesia:  Not Indicated   Additional requests/questions:    Signed, Hipolito Bayley   07/31/2022, 8:29 AM

## 2022-07-31 NOTE — Telephone Encounter (Signed)
Patient with diagnosis of A Fib on Xarelto for anticoagulation.    Procedure: ESI-Left-C6-C7 Date of procedure: 08/07/22   CHA2DS2-VASc Score = 4  This indicates a 4.8% annual risk of stroke. The patient's score is based upon: CHF History: 0 HTN History: 1 Diabetes History: 1 Stroke History: 0 (Has CVA listed on problem list, can not verify)) Vascular Disease History: 1 Age Score: 1 Gender Score: 0     CrCl 91 Platelet count 174K (06/07/21)   Per office protocol, patient can hold Xarelto for 3 days prior to procedure.    **This guidance is not considered finalized until pre-operative APP has relayed final recommendations.**

## 2022-08-01 NOTE — Telephone Encounter (Signed)
   Patient Name: Lucas [name].  DOB: [birth year] MRN: [id number]  Primary Cardiologist: Carlyle Dolly, MD  Chart reviewed as part of pre-operative protocol coverage. Pre-op clearance already addressed by colleagues in earlier phone notes. To summarize recommendations:  -Per office protocol, patient can hold Xarelto for 3 days prior to procedure.  Please resume when medically safe to do so  -Medical clearance was not requested  Will route this bundled recommendation to requesting provider via Epic fax function and remove from pre-op pool. Please call with questions.  Elgie Collard, PA-C 08/01/2022, 8:50 AM

## 2022-08-05 NOTE — Telephone Encounter (Signed)
Alternate Fax#: [phone]

## 2022-08-05 NOTE — Telephone Encounter (Signed)
Portia with [facility] is following up, requesting update on clearance. She states they did not receive our clearance recommendation. Truddie Crumble states the fax/phone number listed for their office is incorrect.  Phone#: [phone] Fax#: [phone]

## 2022-08-05 NOTE — Telephone Encounter (Signed)
Calling back because they havent receieved the clearance yet. Office states its important because patient needs to stop medication today. Please advise

## 2022-08-05 NOTE — Telephone Encounter (Signed)
Faxed to updated fax #. Will remove from preop pool.  Loel Dubonnet, NP

## 2022-08-26 ENCOUNTER — Other Ambulatory Visit: Payer: Self-pay | Admitting: Cardiology

## 2022-09-17 ENCOUNTER — Ambulatory Visit: Payer: 59 | Admitting: Cardiology

## 2022-09-22 ENCOUNTER — Other Ambulatory Visit: Payer: Self-pay | Admitting: Cardiology

## 2022-10-09 ENCOUNTER — Ambulatory Visit: Payer: 59 | Admitting: Cardiology

## 2022-12-18 ENCOUNTER — Encounter (HOSPITAL_COMMUNITY): Payer: Self-pay | Admitting: *Deleted

## 2022-12-24 ENCOUNTER — Other Ambulatory Visit: Payer: Self-pay | Admitting: Cardiology

## 2022-12-24 NOTE — Telephone Encounter (Signed)
Prescription refill request for Xarelto received.  Indication: PAF Last office visit: 06/26/22  Zandra Abts MD Weight: 116.9kg Age: 67 Scr: 0.9 on 09/24/22  [facility] CrCl: 129.98  Based on above findings Xarelto 28m daily is the appropriate dose.  Refill approved.

## 2023-01-02 ENCOUNTER — Ambulatory Visit: Payer: 59 | Attending: Cardiology | Admitting: Cardiology

## 2023-01-02 ENCOUNTER — Encounter: Payer: Self-pay | Admitting: Cardiology

## 2023-01-02 VITALS — BP 130/60 | HR 62 | Ht 70.0 in | Wt 262.0 lb

## 2023-01-02 DIAGNOSIS — I10 Essential (primary) hypertension: Secondary | ICD-10-CM | POA: Diagnosis not present

## 2023-01-02 DIAGNOSIS — E782 Mixed hyperlipidemia: Secondary | ICD-10-CM

## 2023-01-02 DIAGNOSIS — I480 Paroxysmal atrial fibrillation: Secondary | ICD-10-CM | POA: Diagnosis not present

## 2023-01-02 DIAGNOSIS — I712 Thoracic aortic aneurysm, without rupture, unspecified: Secondary | ICD-10-CM | POA: Diagnosis not present

## 2023-01-02 DIAGNOSIS — I2510 Atherosclerotic heart disease of native coronary artery without angina pectoris: Secondary | ICD-10-CM | POA: Diagnosis not present

## 2023-01-02 MED ORDER — NITROGLYCERIN 0.4 MG SL SUBL: 0.4000 mg | SUBLINGUAL_TABLET | SUBLINGUAL | 3 refills | Status: AC | PRN

## 2023-01-02 NOTE — Progress Notes (Signed)
Clinical Summary [name] is a 67 y.o.male seen today for follow up of the following medical problems     1. Aflutter/Afib - s/p aflutter ablation 05/2016 by Dr Curt Bears.  01/2019 event monitor: short episode of afib, SR with PACs - toprol was increased to '150mg'$  daily      - afib ablation 08/2020 - started on multaq, followed by EP clinic  - at 03/2021 since patient had maintained NSR multaq was stopped   - no significant palpitations.  - compliant with meds - no bleeding on xarelto        2. CAD - previous chest pain episode occurred in the setting of afib with RVR - cath 01/29/16 with apical LAD disease, treated medically. Moderate RCA and D2 disease.    - 03/2018 cath distal LAD 50%, mid RCA 60%, LPDA 45%, D2 45%, mildly elevated LVEDP of 18    07/2020 cath: 1.  Left dominant coronary anatomy with diffuse nonobstructive coronary artery disease, no targets for PCI. 2.  Small vessel disease involving the distal circumflex Lucas [name] and a nondominant RCA 3.  Normal LVEDP   -isolated discomfort in April, better with NG - compliant with meds  - isolated episode using NG. Has had some issues cervical radiculopathy       3. Aortic aneurysm - [facility] 08/2018 CT ordered by pcp showed 4.3 ascending aortic aneurysm - he reports has not had repeat CT since   12/2019 stable 50m ascending aorta -09/2021 3.9 cm ascending aortc - recheck toward the end of year.    4. HTN - compliant with meds, have not taken meds yet      5. Hyperliidemia - labs followed by pcp 09/2022 TC 113 TG 93 HDL 47 LDL 48   SH: works [employer] Works days and nights, rotating.       Past Medical History:  Diagnosis Date   Arthritis    Bilateral carpal tunnel syndrome 08/16/2019   CAD (coronary artery disease)    a. LHC on 01/29/16 which revealed significant apical LAD stenosis, best treated medically. There was moderate disease in the D2 and mid nondominant RCA..Marland KitchenNo PCI performed  b.  03/2018: repeat cath showing regression of his LAD stenosis now being at 50% with 60% mid RCA stenosis, 45% ostial LPDA stenosis, and 45% ostial second diagonal stenosis.   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 90 tablet 0   diltiazem (CARDIZEM CD) 240 MG 24 hr capsule Take 1 capsule (240 mg total) by mouth every evening. 90 capsule 3   furosemide (LASIX) 20 MG tablet Take 20 mg by mouth daily as needed for fluid.      gabapentin (NEURONTIN) 300 MG capsule Take 60 mg by mouth 2 (two) times daily. (Patient not taking: Reported  on 06/26/2022)     glimepiride (AMARYL) 4 MG tablet Take 4 mg by mouth 2 (two) times daily.      HYDROcodone-acetaminophen (NORCO/VICODIN) 5-325 MG tablet Take 1 tablet by mouth 2 (two) times daily as needed.     isosorbide mononitrate (IMDUR) 120 MG 24 hr tablet TAKE 1 TABLET BY MOUTH EVERY DAY 90 tablet 2   metoprolol succinate (TOPROL-XL) 100 MG 24 hr tablet TAKE 1 AND 1/2 TABLETS BY MOUTH DAILY WITH OR IMMEDIATELY FOLLOWING A MEAL 135 tablet 1   nitroGLYCERIN (NITROSTAT) 0.4 MG SL tablet Place 1 tablet (0.4 mg total) under the tongue every 5 (five) minutes as needed for chest pain. 25 tablet 3   OZEMPIC, 0.25 OR 0.5 MG/DOSE, 2 MG/1.5ML SOPN Inject 0.5 mg into the skin every Saturday.      pantoprazole (PROTONIX) 40 MG tablet Take 40 mg by mouth daily before breakfast.      SYNJARDY XR 12.03-999 MG TB24 Take 1 tablet by mouth in the  morning and at bedtime.     XARELTO 20 MG TABS tablet TAKE 1 TABLET BY MOUTH DAILY WITH SUPPER 30 tablet 5   No current facility-administered medications for this visit.   Facility-Administered Medications Ordered in Other Visits  Medication Dose Route Frequency Provider Last Rate Last Admin   0.9 %  sodium chloride infusion   Intravenous Continuous PRN Lucas [name]., CRNA   New Bag at 08/31/20 0745     Past Surgical History:  Procedure Laterality Date   ANKLE SURGERY Left 2012   tendon repair   ATRIAL FIBRILLATION ABLATION [name] 08/24/2020   Procedure: ATRIAL FIBRILLATION ABLATION;  Surgeon: Constance Haw, MD;  Location: [place] [facility];  Service: Cardiovascular;  Laterality: [name];   CARDIAC CATHETERIZATION [name] 01/29/2016   Procedure: Left Heart Cath and Coronary Angiography;  Surgeon: [name], MD;  Location: [place] [facility];  Service: Cardiovascular;  Laterality: [name];   CARPAL TUNNEL RELEASE Right 09/06/2019   Procedure: CARPAL TUNNEL RELEASE;  Surgeon: Daryll Brod, MD;  Location: [place];  Service: Orthopedics;  Laterality: Right;   CARPAL TUNNEL RELEASE Left 10/11/2019   Procedure: LEFT CARPAL TUNNEL RELEASE;  Surgeon: Daryll Brod, MD;  Location: [place];  Service: Orthopedics;  Laterality: Left;  IV REGIONAL FOREARM BLOCK   CATARACT EXTRACTION W/PHACO  11/11/2012   Procedure: CATARACT EXTRACTION PHACO AND INTRAOCULAR LENS PLACEMENT (IOC);  Surgeon: Tonny Nastasia Kage, MD;  Location: [facility];  Service: Ophthalmology;  Laterality: Right;  CDE:  5.56   CATARACT EXTRACTION W/PHACO Left 12/15/2013   Procedure: CATARACT EXTRACTION PHACO AND INTRAOCULAR LENS PLACEMENT (IOC);  Surgeon: Tonny Esten Dollar, MD;  Location: [facility];  Service: Ophthalmology;  Laterality: Left;  CDE:13.36   CHOLECYSTECTOMY     CHONDROPLASTY Right 05/28/2017   Procedure: RIGHT KNEE CHONDROPLASTY;  Surgeon: [name], MD;  Location: [place];  Service:  Orthopedics;  Laterality: Right;   COLONOSCOPY     CYSTOSCOPY W/ URETERAL STENT PLACEMENT     right   ELECTROPHYSIOLOGIC STUDY [name] 06/02/2016   Procedure: A-Flutter Ablation;  Surgeon: [name], MD;  Location: [place] [facility];  Service: Cardiovascular;  Laterality: [name];   KNEE ARTHROSCOPY WITH MEDIAL MENISECTOMY Right 05/28/2017   Procedure: KNEE ARTHROSCOPY WITH MEDIAL MENISECTOMY WITH EXTENSIVE SYNOVECTOMY;  Surgeon: [name], MD;  Location: [place];  Service: Orthopedics;  Laterality: Right;   LEFT HEART CATH AND CORONARY ANGIOGRAPHY [name] 03/30/2018  Procedure: LEFT HEART CATH AND CORONARY ANGIOGRAPHY;  Surgeon: Leonie Man, MD;  Location: [place] [facility];  Service: Cardiovascular;  Laterality: [name];   LEFT HEART CATH AND CORONARY ANGIOGRAPHY [name] 07/27/2020   Procedure: LEFT HEART CATH AND CORONARY ANGIOGRAPHY;  Surgeon: Sherren Mocha, MD;  Location: [place] [facility];  Service: Cardiovascular;  Laterality: [name];   left knee sugery Left    Arthroscopy   NASAL SINUS SURGERY     POLYPECTOMY     TEE WITHOUT CARDIOVERSION [name] 08/23/2020   Procedure: TRANSESOPHAGEAL ECHOCARDIOGRAM (TEE);  Surgeon: Acie Fredrickson Wonda Cheng, MD;  Location: [facility];  Service: Cardiovascular;  Laterality: [name];   TONSILLECTOMY       Allergies  Allergen Reactions   Bee Venom Swelling   Codeine Nausea Only      Family History  Problem Relation Age of Onset   Heart murmur Mother    Aneurysm Mother    Hypertension Mother    Cirrhosis Father    Heart attack Sister    Diabetes Sister    Aneurysm Maternal Grandmother    Stroke Maternal Grandmother    Heart attack Paternal Grandmother    Stroke Sister    Colon cancer Neg Hx    Rectal cancer Neg Hx    Stomach cancer Neg Hx      Social History Mr. Berenson reports that he has never smoked. He has never used smokeless tobacco. Mr. Fila reports current alcohol use.   Review of Systems CONSTITUTIONAL: No  weight loss, fever, chills, weakness or fatigue.  HEENT: Eyes: No visual loss, blurred vision, double vision or yellow sclerae.No hearing loss, sneezing, congestion, runny nose or sore throat.  SKIN: No rash or itching.  CARDIOVASCULAR: per hpi RESPIRATORY: No shortness of breath, cough or sputum.  GASTROINTESTINAL: No anorexia, nausea, vomiting or diarrhea. No abdominal pain or blood.  GENITOURINARY: No burning on urination, no polyuria NEUROLOGICAL: No headache, dizziness, syncope, paralysis, ataxia, numbness or tingling in the extremities. No change in bowel or bladder control.  MUSCULOSKELETAL: No muscle, back pain, joint pain or stiffness.  LYMPHATICS: No enlarged nodes. No history of splenectomy.  PSYCHIATRIC: No history of depression or anxiety.  ENDOCRINOLOGIC: No reports of sweating, cold or heat intolerance. No polyuria or polydipsia.  [name]   Physical Examination Today's Vitals   01/02/23 0823  BP: 130/60  Pulse: 62  SpO2: 96%  Weight: 262 lb (118.8 kg)  Height: '5\' 10"'$  (1.778 m)   Body mass index is 37.59 kg/m.  Gen: resting comfortably, no acute distress HEENT: no scleral icterus, pupils equal round and reactive, no palptable cervical adenopathy,  CV: RRR, no m/rg, no jvd Resp: Clear to auscultation bilaterally GI: abdomen is soft, non-tender, non-distended, normal bowel sounds, no hepatosplenomegaly MSK: extremities are warm, no edema.  Skin: warm, no rash Neuro:  no focal deficits Psych: appropriate affect   Diagnostic Studies  01/2016 echo Study Conclusions   - Left ventricle: The cavity size was mildly dilated. Wall   thickness was increased in a pattern of mild LVH. Systolic   function was normal. The estimated ejection fraction was in the   range of 55% to 60%.   01/2016 cath Dist LAD lesion, 80% stenosed. Mid RCA lesion, 60% stenosed. Ost 2nd Diag to 2nd Diag lesion, 60% stenosed. 2nd Mrg lesion, 30% stenosed. Mid LAD lesion, 25% stenosed. LPDA  lesion, 45% stenosed.   Significant apical LAD stenosis, best treated with medication. Moderate disease in the second diagonal and mid nondominant  RCA. Normal left ventricular systolic function. Normal left ventricular filling pressures.   [name] Cath Recommendations:    Medical therapy of both atrial fibrillation and coronary artery disease. Percutaneous intervention is not currently indicated for coronary disease.   03/2018 cath Dist LAD lesion is 50% stenosed. -Demonstrates regression of disease Mid RCA (nondominant) stable lesion is 60% stenosed. Stable Ost LPDA to LPDA lesion is 45% stenosed. Stable Ost 2nd Diag lesion is 45% stenosed. Demonstrates progression of disease The left ventricular systolic function is normal. The left ventricular ejection fraction is 55-65% by visual estimate. LV end diastolic pressure is mildly elevated.   Angiographically stable to improved coronary disease with notably less significant disease in the diagonal Shrika Milos as well as the apical LAD.   No potential culprit lesion to explain the patient's symptoms. Mildly elevated LVEDP.   Plan:  Discharge home after TR band removal and bedrest.   Restart Xarelto tonight.   01/2019 event monitor 14 day event monitor Min HR 54, Max HR 120, Avg HR 76 Sinus rhythm with PACs, with episode of afib rate 120 Reported symptoms correlated with sinus rhythm and PACs     09/2021 CTA IMPRESSION: 1. The thoracic aorta is not overtly aneurysmal on the current study measuring 3.9 cm in maximum caliber in the ascending segment. 2. Stable dilatation of the main pulmonary artery without significant additional central pulmonary artery dilatation. 3. Stable calcified granuloma in the posterior left lower lobe.   Assessment and Plan   1. Aflutter/Afib/acquired thrombophilia - no significant symptoms, continue current meds including xarelto for stroke prevention   2. CAD - no regular symptoms, continue current meds      3. HTN -at goal, continue current meds     4. Aortic aneurysm -repeat CTA later this year  5. Hyperlipidemia - at goal, continue current meds     Arnoldo Lenis, M.D.

## 2023-01-02 NOTE — Addendum Note (Signed)
Addended by: Laurine Blazer on: 01/02/2023 09:07 AM   Modules accepted: Orders

## 2023-01-02 NOTE — Patient Instructions (Signed)
Medication Instructions:  Nitroglycerin refilled today Continue all other medications.     Labwork: none  Testing/Procedures: none  Follow-Up: 6 months   Any Other Special Instructions Will Be Listed Below (If Applicable).   If you need a refill on your cardiac medications before your next appointment, please call your pharmacy.

## 2023-01-07 ENCOUNTER — Other Ambulatory Visit: Payer: Self-pay | Admitting: Neurological Surgery

## 2023-01-07 DIAGNOSIS — M5412 Radiculopathy, cervical region: Secondary | ICD-10-CM

## 2023-01-08 ENCOUNTER — Telehealth: Payer: Self-pay | Admitting: Dermatology

## 2023-01-08 NOTE — Telephone Encounter (Signed)
   Pre-operative Risk Assessment    Patient Name: Lucas [name].  DOB: Apr 23, 1956 MRN: [id number]     Request for Surgical Clearance    Procedure:   #30  Date of Surgery:  Clearance TBD                                 Surgeon:  not indiacted Surgeon's Group or Practice Name:  [facility]  Phone number:  [phone] Fax number:  [phone]   Type of Clearance Requested:   - Pharmacy:  Hold Apixaban (Eliquis) ?   Type of Anesthesia:  Local    Additional requests/questions:    Sandrea Hammond   01/08/2023, 12:04 PM

## 2023-01-08 NOTE — Telephone Encounter (Signed)
    Primary Cardiologist: Carlyle Dolly, MD  Chart reviewed as part of pre-operative protocol coverage. Simple dental extractions are considered low risk procedures per guidelines and generally do not require any specific cardiac clearance. It is also generally accepted that for simple extractions and dental cleanings, there is no need to interrupt blood thinner therapy.   SBE prophylaxis is not required for the patient.  I will route this recommendation to the requesting party via Epic fax function and remove from pre-op pool.  Please call with questions.  Elgie Collard, PA-C 01/08/2023, 5:25 PM

## 2023-01-08 NOTE — Telephone Encounter (Signed)
Tooth # 30 is the tooth location in the mouth. Pt is 1 tooth extracted. We have asked the DDS office to NOT list the tooth # as this is confusing. We have asked to please let us know how many teeth are being extracted.

## 2023-01-19 ENCOUNTER — Other Ambulatory Visit: Payer: Self-pay | Admitting: Cardiology

## 2023-01-30 ENCOUNTER — Ambulatory Visit
Admission: RE | Admit: 2023-01-30 | Discharge: 2023-01-30 | Disposition: A | Discharge status: Home / Self Care | Payer: 59 | Source: Ambulatory Visit | Attending: Neurological Surgery | Admitting: Neurological Surgery

## 2023-01-30 DIAGNOSIS — M5412 Radiculopathy, cervical region: Secondary | ICD-10-CM

## 2023-04-22 ENCOUNTER — Other Ambulatory Visit: Payer: Self-pay | Admitting: Cardiology

## 2023-06-18 ENCOUNTER — Ambulatory Visit: Payer: 59 | Attending: Cardiology | Admitting: Cardiology

## 2023-06-18 ENCOUNTER — Encounter: Payer: Self-pay | Admitting: Cardiology

## 2023-06-18 ENCOUNTER — Telehealth: Payer: Self-pay | Admitting: Cardiology

## 2023-06-18 VITALS — BP 128/82 | HR 66 | Ht 70.0 in | Wt 259.8 lb

## 2023-06-18 DIAGNOSIS — I480 Paroxysmal atrial fibrillation: Secondary | ICD-10-CM | POA: Diagnosis not present

## 2023-06-18 DIAGNOSIS — I2510 Atherosclerotic heart disease of native coronary artery without angina pectoris: Secondary | ICD-10-CM | POA: Diagnosis not present

## 2023-06-18 DIAGNOSIS — I10 Essential (primary) hypertension: Secondary | ICD-10-CM

## 2023-06-18 DIAGNOSIS — I712 Thoracic aortic aneurysm, without rupture, unspecified: Secondary | ICD-10-CM | POA: Diagnosis not present

## 2023-06-18 DIAGNOSIS — Z01812 Encounter for preprocedural laboratory examination: Secondary | ICD-10-CM

## 2023-06-18 NOTE — Progress Notes (Signed)
Clinical Summary [name] is a 67 y.o.male seen today for follow up of the following medical problems     1. Aflutter/Afib - s/p aflutter ablation 05/2016 by Dr Elberta Fortis.  01/2019 event monitor: short episode of afib, SR with PACs - toprol was increased to 150mg  daily      - afib ablation 08/2020 - started on multaq, followed by EP clinic  - at 03/2021 since patient had maintained NSR multaq was stopped    - no recent palpitations -compliant with meds. No bleeding on xarelto - EKG today shows NSR      2. CAD - previous chest pain episode occurred in the setting of afib with RVR - cath 01/29/16 with apical LAD disease, treated medically. Moderate RCA and D2 disease.    - 03/2018 cath distal LAD 50%, mid RCA 60%, LPDA 45%, D2 45%, mildly elevated LVEDP of 18    07/2020 cath: 1.  Left dominant coronary anatomy with diffuse nonobstructive coronary artery disease, no targets for PCI. 2.  Small vessel disease involving the distal circumflex Alando Colleran and a nondominant RCA 3.  Normal LVEDP     - no chest pains. No specific SOB/DOE   3. Aortic aneurysm - [facility] 08/2018 CT ordered by pcp showed 4.3 ascending aortic aneurysm   12/2019 stable 40mm ascending aorta -09/2021 3.9 cm ascending aortc - recheck toward the end of year.    4. HTN - compliant with meds, have not taken meds yet      5. 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     Past Medical History:  Diagnosis Date   Arthritis    Bilateral carpal tunnel syndrome 08/16/2019   CAD (coronary artery disease)    a. LHC on 01/29/16 which revealed significant apical LAD stenosis, best treated medically. There was moderate disease in the D2 and mid nondominant RCA.[name] No PCI performed  b. 03/2018: repeat cath showing regression of his LAD stenosis now being at 50% with 60% mid RCA stenosis, 45% ostial LPDA stenosis, and 45% ostial  second diagonal stenosis.   Cataract    Chronic back pain    Diabetes mellitus    type 2   GERD (gastroesophageal reflux disease)    Heart disease    Hyperlipidemia    Hypertension    Obesity    PAF (paroxysmal atrial fibrillation) (HCC) 01/2016   Sleep apnea    has CPAP machine but not wear   Tubular adenoma of colon 08/2012   Ulnar neuropathy at elbow 08/16/2019   Bilateral     Allergies  Allergen Reactions   Bee Venom Swelling   Codeine Nausea Only     Current Outpatient Medications  Medication Sig Dispense Refill   atorvastatin (LIPITOR) 80 MG tablet TAKE 1 TABLET BY MOUTH DAILY AT 6 PM. 90 tablet 1   diltiazem (CARDIZEM CD) 240 MG 24 hr capsule TAKE 1 CAPSULE (240 MG TOTAL) BY MOUTH EVERY EVENING. 90 capsule 1   furosemide (LASIX) 20 MG tablet Take 20 mg by mouth daily as needed for fluid.      gabapentin (NEURONTIN) 600 MG tablet Take 600 mg by mouth 2 (two) times daily.     glimepiride (AMARYL) 4 MG tablet Take 4 mg by mouth 2 (two) times daily.      HYDROcodone-acetaminophen (NORCO/VICODIN) 5-325 MG tablet Take 1 tablet by  mouth 2 (two) times daily as needed.     isosorbide mononitrate (IMDUR) 120 MG 24 hr tablet TAKE 1 TABLET BY MOUTH EVERY DAY 90 tablet 2   metoprolol succinate (TOPROL-XL) 100 MG 24 hr tablet TAKE 1 AND 1/2 TABLETS BY MOUTH DAILY WITH OR IMMEDIATELY FOLLOWING A MEAL 135 tablet 1   nitroGLYCERIN (NITROSTAT) 0.4 MG SL tablet Place 1 tablet (0.4 mg total) under the tongue every 5 (five) minutes as needed for chest pain. 25 tablet 3   OZEMPIC, 0.25 OR 0.5 MG/DOSE, 2 MG/1.5ML SOPN Inject 1 mg into the skin every Saturday.     pantoprazole (PROTONIX) 40 MG tablet Take 40 mg by mouth daily before breakfast.      SYNJARDY XR 12.03-999 MG TB24 Take 1 tablet by mouth in the morning and at bedtime.     XARELTO 20 MG TABS tablet TAKE 1 TABLET BY MOUTH DAILY WITH SUPPER 30 tablet 5   No current facility-administered medications for this visit.    Facility-Administered Medications Ordered in Other Visits  Medication Dose Route Frequency Provider Last Rate Last Admin   0.9 %  sodium chloride infusion   Intravenous Continuous PRN Laruth Bouchard., CRNA   New Bag at 08/31/20 0745     Past Surgical History:  Procedure Laterality Date   ANKLE SURGERY Left 2012   tendon repair   ATRIAL FIBRILLATION ABLATION [name] 08/24/2020   Procedure: ATRIAL FIBRILLATION ABLATION;  Surgeon: Regan Lemming, MD;  [facility]: [facility] CV LAB;  Service: Cardiovascular;  Laterality: [name];   CARDIAC CATHETERIZATION [name] 01/29/2016   Procedure: Left Heart Cath and Coronary Angiography;  Surgeon: Lyn Records, MD;  Location: [facility] INVASIVE CV LAB;  Service: Cardiovascular;  Laterality: [name];   CARPAL TUNNEL RELEASE Right 09/06/2019   Procedure: CARPAL TUNNEL RELEASE;  Surgeon: Cindee Salt, MD;  Location: [facility];  Service: Orthopedics;  Laterality: Right;   CARPAL TUNNEL RELEASE Left 10/11/2019   Procedure: LEFT CARPAL TUNNEL RELEASE;  Surgeon: Cindee Salt, MD;  Location: [facility];  Service: Orthopedics;  Laterality: Left;  IV REGIONAL FOREARM BLOCK   CATARACT EXTRACTION W/PHACO  11/11/2012   Procedure: CATARACT EXTRACTION PHACO AND INTRAOCULAR LENS PLACEMENT (IOC);  Surgeon: Gemma Payor, MD;  Location: [facility];  Service: Ophthalmology;  Laterality: Right;  CDE:  5.56   CATARACT EXTRACTION W/PHACO Left 12/15/2013   Procedure: CATARACT EXTRACTION PHACO AND INTRAOCULAR LENS PLACEMENT (IOC);  Surgeon: Gemma Payor, MD;  Location: [facility];  Service: Ophthalmology;  Laterality: Left;  CDE:13.36   CHOLECYSTECTOMY     CHONDROPLASTY Right 05/28/2017   Procedure: RIGHT KNEE CHONDROPLASTY;  Surgeon: Loreta Ave, MD;  Location: [facility];  Service: Orthopedics;  Laterality: Right;   COLONOSCOPY     CYSTOSCOPY W/ URETERAL STENT PLACEMENT     right   ELECTROPHYSIOLOGIC STUDY [name] 06/02/2016   Procedure: A-Flutter Ablation;   Surgeon: Will Jorja Loa, MD;  Location: [facility];  Service: Cardiovascular;  Laterality: [name];   KNEE ARTHROSCOPY WITH MEDIAL MENISECTOMY Right 05/28/2017   Procedure: KNEE ARTHROSCOPY WITH MEDIAL MENISECTOMY WITH EXTENSIVE SYNOVECTOMY;  Surgeon: Loreta Ave, MD;  Location: [facility];  Service: Orthopedics;  Laterality: Right;   LEFT HEART CATH AND CORONARY ANGIOGRAPHY [name] 03/30/2018   Procedure: LEFT HEART CATH AND CORONARY ANGIOGRAPHY;  Surgeon: Marykay Lex, MD;  Location: [facility] INVASIVE CV LAB;  Service: Cardiovascular;  Laterality: [name];   LEFT HEART CATH AND CORONARY ANGIOGRAPHY  [name] 07/27/2020   Procedure: LEFT HEART CATH AND CORONARY ANGIOGRAPHY;  Surgeon: Tonny Bollman, MD;  Location: [facility] INVASIVE CV LAB;  Service: Cardiovascular;  Laterality: [name];   left knee sugery Left    Arthroscopy   NASAL SINUS SURGERY     POLYPECTOMY     TEE WITHOUT CARDIOVERSION [name] 08/23/2020   Procedure: TRANSESOPHAGEAL ECHOCARDIOGRAM (TEE);  Surgeon: Elease Hashimoto Deloris Ping, MD;  Location: [facility];  Service: Cardiovascular;  Laterality: [name];   TONSILLECTOMY       Allergies  Allergen Reactions   Bee Venom Swelling   Codeine Nausea Only      Family History  Problem Relation Age of Onset   Heart murmur Mother    Aneurysm Mother    Hypertension Mother    Cirrhosis Father    Heart attack Sister    Diabetes Sister    Aneurysm Maternal Grandmother    Stroke Maternal Grandmother    Heart attack Paternal Grandmother    Stroke Sister    Colon cancer Neg Hx    Rectal cancer Neg Hx    Stomach cancer Neg Hx      Social History Mr. Ricard reports that he has never smoked. He has never used smokeless tobacco. Mr. Paynter reports current alcohol use.   Review of Systems CONSTITUTIONAL: No weight loss, fever, chills, weakness or fatigue.  HEENT: Eyes: No visual loss, blurred vision, double vision or yellow sclerae.No hearing loss, sneezing, congestion, runny nose or sore  throat.  SKIN: No rash or itching.  CARDIOVASCULAR: per hpi RESPIRATORY: No shortness of breath, cough or sputum.  GASTROINTESTINAL: No anorexia, nausea, vomiting or diarrhea. No abdominal pain or blood.  GENITOURINARY: No burning on urination, no polyuria NEUROLOGICAL: No headache, dizziness, syncope, paralysis, ataxia, numbness or tingling in the extremities. No change in bowel or bladder control.  MUSCULOSKELETAL: No muscle, back pain, joint pain or stiffness.  LYMPHATICS: No enlarged nodes. No history of splenectomy.  PSYCHIATRIC: No history of depression or anxiety.  ENDOCRINOLOGIC: No reports of sweating, cold or heat intolerance. No polyuria or polydipsia.  [name]   Physical Examination Today's Vitals   06/18/23 0956  BP: 128/82  Pulse: 66  SpO2: 98%  Weight: 259 lb 12.8 oz (117.8 kg)  Height: 5\' 10"  (1.778 m)   Body mass index is 37.28 kg/m.  Gen: resting comfortably, no acute distress HEENT: no scleral icterus, pupils equal round and reactive, no palptable cervical adenopathy,  CV: RRR, no mrg, no jvd Resp: Clear to auscultation bilaterally GI: abdomen is soft, non-tender, non-distended, normal bowel sounds, no hepatosplenomegaly MSK: extremities are warm, no edema.  Skin: warm, no rash Neuro:  no focal deficits Psych: appropriate affect   Diagnostic Studies   01/2016 echo Study Conclusions   - Left ventricle: The cavity size was mildly dilated. Wall   thickness was increased in a pattern of mild LVH. Systolic   function was normal. The estimated ejection fraction was in the   range of 55% to 60%.   01/2016 cath Dist LAD lesion, 80% stenosed. Mid RCA lesion, 60% stenosed. Ost 2nd Diag to 2nd Diag lesion, 60% stenosed. 2nd Mrg lesion, 30% stenosed. Mid LAD lesion, 25% stenosed. LPDA lesion, 45% stenosed.   Significant apical LAD stenosis, best treated with medication. Moderate disease in the second diagonal and mid nondominant RCA. Normal left ventricular  systolic function. Normal left ventricular filling pressures.   Post Cath Recommendations:    Medical therapy of both atrial fibrillation and coronary artery disease. Percutaneous  intervention is not currently indicated for coronary disease.   03/2018 cath Dist LAD lesion is 50% stenosed. -Demonstrates regression of disease Mid RCA (nondominant) stable lesion is 60% stenosed. Stable Ost LPDA to LPDA lesion is 45% stenosed. Stable Ost 2nd Diag lesion is 45% stenosed. Demonstrates progression of disease The left ventricular systolic function is normal. The left ventricular ejection fraction is 55-65% by visual estimate. LV end diastolic pressure is mildly elevated.   Angiographically stable to improved coronary disease with notably less significant disease in the diagonal Eric Morganti as well as the apical LAD.   No potential culprit lesion to explain the patient's symptoms. Mildly elevated LVEDP.   Plan:  Discharge home after TR band removal and bedrest.   Restart Xarelto tonight.   01/2019 event monitor 14 day event monitor Min HR 54, Max HR 120, Avg HR 76 Sinus rhythm with PACs, with episode of afib rate 120 Reported symptoms correlated with sinus rhythm and PACs     09/2021 CTA IMPRESSION: 1. The thoracic aorta is not overtly aneurysmal on the current study measuring 3.9 cm in maximum caliber in the ascending segment. 2. Stable dilatation of the main pulmonary artery without significant additional central pulmonary artery dilatation. 3. Stable calcified granuloma in the posterior left lower lobe.    Assessment and Plan  1. Aflutter/Afib/acquired thrombophilia - no significant symptoms, continue current meds including xarelto for stroke prevention   2. CAD - no regular symptoms, continue current meds     3. HTN -at goal, continue current meds     4. Aortic aneurysm -repeat CTA later this year   5. Hyperlipidemia - at goal, continue current meds      Antoine Poche, M.D.

## 2023-06-18 NOTE — Telephone Encounter (Signed)
Checking percert on the following patient for testing scheduled at [facility].     CT ANGIO CHEST 09-18-2023

## 2023-06-18 NOTE — Patient Instructions (Addendum)
Medication Instructions:   Continue all current medications.   Labwork:  BMET - order given Please do prior to CT   Testing/Procedures:  CT Angiography (CTA) of chest, is a special type of CT scan that uses a computer to produce multi-dimensional views of major blood vessels throughout the body. In CT angiography, a contrast material is injected through an IV to help visualize the blood vessels (chest & aorta) Office will contact with results via phone, letter or mychart.     Follow-Up:  6 months   Any Other Special Instructions Will Be Listed Below (If Applicable).   If you need a refill on your cardiac medications before your next appointment, please call your pharmacy.

## 2023-06-19 ENCOUNTER — Telehealth: Payer: Self-pay

## 2023-06-19 ENCOUNTER — Ambulatory Visit: Payer: 59 | Admitting: Cardiology

## 2023-06-19 NOTE — Telephone Encounter (Signed)
Patient called in today. Patient states he went to see his  PCP due to having kidney pain and went to the ED where an MRI was done but was told it should result in 7 days.. Patient/wife states that patient had MRI but is unsure the size of the kidney stone. Patient/wife is aware that a referral may be needed and a message will be sent to our referral coordinator to give a call back.  Please call [phone] Patient/wife voiced understanding

## 2023-06-22 ENCOUNTER — Other Ambulatory Visit: Payer: Self-pay | Admitting: Cardiology

## 2023-06-22 NOTE — Telephone Encounter (Signed)
Prescription refill request for Xarelto received.  Indication: PAF Last office visit: 06/18/23  Dominga Ferry MD Weight: 117.8kg Age:67 Scr: 0.9 on 09/24/22  [facility] CrCl: 130.89  Based on above findings Xarelto 20mg  daily is the appropriate dose.  Refill approved.

## 2023-07-13 ENCOUNTER — Other Ambulatory Visit: Payer: Self-pay | Admitting: Cardiology

## 2023-08-06 ENCOUNTER — Encounter: Payer: Self-pay | Admitting: Urology

## 2023-08-06 ENCOUNTER — Ambulatory Visit: Payer: 59 | Admitting: Urology

## 2023-08-06 VITALS — BP 130/85 | HR 71 | Ht 70.0 in | Wt 259.0 lb

## 2023-08-06 DIAGNOSIS — R109 Unspecified abdominal pain: Secondary | ICD-10-CM

## 2023-08-06 DIAGNOSIS — Z87442 Personal history of urinary calculi: Secondary | ICD-10-CM | POA: Diagnosis not present

## 2023-08-06 DIAGNOSIS — N5201 Erectile dysfunction due to arterial insufficiency: Secondary | ICD-10-CM

## 2023-08-06 LAB — URINALYSIS, ROUTINE W REFLEX MICROSCOPIC
Bilirubin, UA: NEGATIVE
Ketones, UA: NEGATIVE
Leukocytes,UA: NEGATIVE
Nitrite, UA: NEGATIVE
Protein,UA: NEGATIVE
RBC, UA: NEGATIVE
Specific Gravity, UA: 1.015 (ref 1.005–1.030)
Urobilinogen, Ur: 1 mg/dL (ref 0.2–1.0)
pH, UA: 6 (ref 5.0–7.5)

## 2023-08-06 NOTE — Progress Notes (Signed)
Subjective: 1. History of nephrolithiasis   2. Flank pain   3. Erectile dysfunction due to arterial insufficiency      Consult requested by Dr. Doreen Beam.  08/06/23: Lucas [name] is a 67 yo male with a history of stones who is having issues with flank pain.  He had a KUB in August that showed no stones but chronic scoliosis with lumbar DDD.  He has a history of chronic back pain and is on Gabapentin and has had hydrocodone at intervals. He has had a prior cystoscopy and right ureteral stent insertion at [facility] in the past.   He had the onset about 2-3 months ago of some left flank pain that was better after voiding and then it moved to the right.   He took amoxicillin for a dental issue about 2-3 weeks ago and has had no pain since.   He has some frequency and nocturia but is a diabetic on Synjardy.   His IPSS is 8.  He has ED but is on isosorbide and xarelto which make oral meds and a VED poor options.  ROS:  Review of Systems  HENT:  Positive for congestion.   Eyes:  Positive for blurred vision and double vision.  Cardiovascular:  Positive for leg swelling.  Musculoskeletal:  Positive for back pain and joint pain.  Neurological:  Positive for dizziness.  Endo/Heme/Allergies:  Positive for polydipsia.  All other systems reviewed and are negative.   Allergies  Allergen Reactions   Bee Venom Swelling   Codeine Nausea Only    Past Medical History:  Diagnosis Date   Arthritis    Bilateral carpal tunnel syndrome 08/16/2019   CAD (coronary artery disease)    a. LHC on 01/29/16 which revealed significant apical LAD stenosis, best treated medically. There was moderate disease in the D2 and mid nondominant RCA.[name] No PCI performed  b. 03/2018: repeat cath showing regression of his LAD stenosis now being at 50% with 60% mid RCA stenosis, 45% ostial LPDA stenosis, and 45% ostial second diagonal stenosis.   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   Bilateral    Past Surgical History:  Procedure Laterality Date   ANKLE SURGERY Left 2012   tendon repair   ATRIAL FIBRILLATION ABLATION [name] 08/24/2020   Procedure: ATRIAL FIBRILLATION ABLATION;  Surgeon: Regan Lemming, MD;  Location: [facility];  Service: Cardiovascular;  Laterality: [name];   CARDIAC CATHETERIZATION [name] 01/29/2016   Procedure: Left Heart Cath and Coronary Angiography;  Surgeon: Lyn Records, MD;  Location: [facility] INVASIVE CV LAB;  Service: Cardiovascular;  Laterality: [name];   CARPAL TUNNEL RELEASE Right 09/06/2019   Procedure: CARPAL TUNNEL RELEASE;  Surgeon: Cindee Salt, MD;  Location: [facility];  Service: Orthopedics;  Laterality: Right;   CARPAL TUNNEL RELEASE Left 10/11/2019   Procedure: LEFT CARPAL TUNNEL RELEASE;  Surgeon: Cindee Salt, MD;  Location: [facility];  Service: Orthopedics;  Laterality: Left;  IV REGIONAL FOREARM BLOCK   CATARACT EXTRACTION W/PHACO  11/11/2012   Procedure: CATARACT EXTRACTION PHACO AND INTRAOCULAR LENS PLACEMENT (IOC);  Surgeon: Gemma Payor, MD;  Location: [facility];  Service: Ophthalmology;  Laterality: Right;  CDE:  5.56   CATARACT EXTRACTION W/PHACO Left 12/15/2013   Procedure: CATARACT EXTRACTION PHACO AND INTRAOCULAR LENS PLACEMENT (IOC);  Surgeon: Gemma Payor, MD;  Location: [facility];  Service: Ophthalmology;  Laterality: Left;  CDE:13.36   CHOLECYSTECTOMY     CHONDROPLASTY Right 05/28/2017   Procedure: RIGHT KNEE CHONDROPLASTY;  Surgeon: Loreta Ave, MD;  Location: [facility];  Service: Orthopedics;  Laterality: Right;   COLONOSCOPY     CYSTOSCOPY W/ URETERAL STENT PLACEMENT     right   ELECTROPHYSIOLOGIC STUDY [name] 06/02/2016   Procedure:  A-Flutter Ablation;  Surgeon: Will Jorja Loa, MD;  Location: [facility];  Service: Cardiovascular;  Laterality: [name];   KNEE ARTHROSCOPY WITH MEDIAL MENISECTOMY Right 05/28/2017   Procedure: KNEE ARTHROSCOPY WITH MEDIAL MENISECTOMY WITH EXTENSIVE SYNOVECTOMY;  Surgeon: Loreta Ave, MD;  Location: [facility];  Service: Orthopedics;  Laterality: Right;   LEFT HEART CATH AND CORONARY ANGIOGRAPHY [name] 03/30/2018   Procedure: LEFT HEART CATH AND CORONARY ANGIOGRAPHY;  Surgeon: Marykay Lex, MD;  Location: [facility] INVASIVE CV LAB;  Service: Cardiovascular;  Laterality: [name];   LEFT HEART CATH AND CORONARY ANGIOGRAPHY [name] 07/27/2020   Procedure: LEFT HEART CATH AND CORONARY ANGIOGRAPHY;  Surgeon: Tonny Bollman, MD;  Location: [facility] INVASIVE CV LAB;  Service: Cardiovascular;  Laterality: [name];   left knee sugery Left    Arthroscopy   NASAL SINUS SURGERY     POLYPECTOMY     TEE WITHOUT CARDIOVERSION [name] 08/23/2020   Procedure: TRANSESOPHAGEAL ECHOCARDIOGRAM (TEE);  Surgeon: Elease Hashimoto Deloris Ping, MD;  Location: [facility];  Service: Cardiovascular;  Laterality: [name];   TONSILLECTOMY      Social History   Socioeconomic History   Marital status: Married    Spouse name: Not on file   Number of children: Not on file   Years of education: 12   Highest education level: Not on file  Occupational History   Not on file  Tobacco Use   Smoking status: Never   Smokeless tobacco: Never  Vaping Use   Vaping status: Never Used  Substance and Sexual Activity   Alcohol use: Yes    Alcohol/week: 0.0 standard drinks of alcohol    Comment: rare occasion   Drug use: No   Sexual activity: Yes    Birth control/protection: None  Other Topics Concern   Not on file  Social History Narrative   Right handed   Caffeine~ 1-2 cups per day   Lives at home with wife Robin    Social Determinants of Health   Financial Resource Strain: Not on file  Food Insecurity: Not on file  Transportation  Needs: Not on file  Physical Activity: Not on file  Stress: Not on file  Social Connections: Not on file  Intimate Partner Violence: Not on file    Family History  Problem Relation Age of Onset   Heart murmur Mother    Aneurysm Mother    Hypertension Mother    Cirrhosis Father    Heart attack Sister    Diabetes Sister    Aneurysm Maternal Grandmother    Stroke Maternal Grandmother    Heart attack Paternal Grandmother    Stroke Sister    Colon cancer Neg Hx    Rectal cancer Neg Hx    Stomach cancer Neg Hx     Anti-infectives: Anti-infectives (From admission, onward)    None  Current Outpatient Medications  Medication Sig Dispense Refill   atorvastatin (LIPITOR) 80 MG tablet TAKE 1 TABLET BY MOUTH DAILY AT 6 PM. 90 tablet 1   diltiazem (CARDIZEM CD) 240 MG 24 hr capsule TAKE 1 CAPSULE (240 MG TOTAL) BY MOUTH EVERY EVENING. 90 capsule 1   furosemide (LASIX) 20 MG tablet Take 20 mg by mouth daily as needed for fluid.      gabapentin (NEURONTIN) 600 MG tablet Take 600 mg by mouth 2 (two) times daily.     glimepiride (AMARYL) 4 MG tablet Take 4 mg by mouth 2 (two) times daily.      HYDROcodone-acetaminophen (NORCO/VICODIN) 5-325 MG tablet Take 1 tablet by mouth 2 (two) times daily as needed.     isosorbide mononitrate (IMDUR) 120 MG 24 hr tablet TAKE 1 TABLET BY MOUTH EVERY DAY 90 tablet 2   metoprolol succinate (TOPROL-XL) 100 MG 24 hr tablet TAKE 1 AND 1/2 TABLETS BY MOUTH DAILY WITH OR IMMEDIATELY FOLLOWING A MEAL 135 tablet 1   nitroGLYCERIN (NITROSTAT) 0.4 MG SL tablet Place 1 tablet (0.4 mg total) under the tongue every 5 (five) minutes as needed for chest pain. 25 tablet 3   OZEMPIC, 0.25 OR 0.5 MG/DOSE, 2 MG/1.5ML SOPN Inject 1 mg into the skin every Saturday.     pantoprazole (PROTONIX) 40 MG tablet Take 40 mg by mouth daily before breakfast.      SYNJARDY XR 12.03-999 MG TB24 Take 1 tablet by mouth in the morning and at bedtime.     XARELTO 20 MG TABS tablet  TAKE 1 TABLET BY MOUTH DAILY WITH SUPPER 30 tablet 5   No current facility-administered medications for this visit.   Facility-Administered Medications Ordered in Other Visits  Medication Dose Route Frequency Provider Last Rate Last Admin   0.9 %  sodium chloride infusion   Intravenous Continuous PRN Laruth Bouchard., CRNA   New Bag at 08/31/20 0745     Objective: Vital signs in last 24 hours: BP 130/85   Pulse 71   Ht 5\' 10"  (1.778 m)   Wt 259 lb (117.5 kg)   BMI 37.16 kg/m   Intake/Output from previous day: No intake/output data recorded. Intake/Output this shift: @IOTHISSHIFT @   Physical Exam Vitals reviewed.  Constitutional:      Appearance: Normal appearance. He is obese.  Abdominal:     Palpations: Abdomen is soft.     Tenderness: There is left CVA tenderness (mild).  Neurological:     Mental Status: He is alert.     Lab Results:  No results found for this or any previous visit (from the past 24 hour(s)).  BMET No results for input(s): "NA", "K", "CL", "CO2", "GLUCOSE", "BUN", "CREATININE", "CALCIUM" in the last 72 hours. PT/INR No results for input(s): "LABPROT", "INR" in the last 72 hours. ABG No results for input(s): "PHART", "HCO3" in the last 72 hours.  Invalid input(s): "PCO2", "PO2" UA has 3+ glucose.  I have reviewed records in [facility] and Care Everywhere.   Studies/Results: No results found. KUB from [facility] reviewed.     CT in 2022 that showed no renal stones.  Assessment/Plan: Flank pain with a history of stones.  His UA is clear and a KUB was negative for stones as was a CT in 2022.   I favor a musculoskeletal cause of his pain.    ED.  He is not a candidate for oral meds or a VED but could be treated with injeciton therapy or an IPP.  No orders of the defined types were placed in this encounter.    Orders Placed This Encounter  Procedures   Urinalysis, Routine w reflex microscopic     Return if symptoms worsen or fail to improve.     CC: Dr. Doreen Beam.      Lucas [name] 08/06/2023

## 2023-08-19 ENCOUNTER — Telehealth: Payer: Self-pay | Admitting: Cardiology

## 2023-08-19 NOTE — Telephone Encounter (Signed)
   Pre-operative Risk Assessment    Patient Name: Lucas [name].  DOB: October 24, 1956 MRN: [id number]      Request for Surgical Clearance    Procedure:   Thoracic Epidural Steriod injection   Date of Surgery:  Clearance TBD                                 Surgeon:  Dr. Aileen Fass  Surgeon's Group or Practice Name:  [facility] Phone number:   Fax number:  [phone]   Type of Clearance Requested:   - Pharmacy:  Hold Rivaroxaban (Xarelto) 3 days?   Type of Anesthesia:   none   Additional requests/questions:    [name]   08/19/2023, 4:19 PM

## 2023-08-20 NOTE — Telephone Encounter (Signed)
Patient with diagnosis of atrial fibrillation on Xarelto for anticoagulation.    Procedure:   Thoracic Epidural Steriod injection    Date of Surgery:  Clearance TBD     CHA2DS2-VASc Score = 4   This indicates a 4.8% annual risk of stroke. The patient's score is based upon: CHF History: 0 HTN History: 1 Diabetes History: 1 Stroke History: 0 Vascular Disease History: 1 Age Score: 1 Gender Score: 0   Chart indicates history of stroke, but no other information found.  Reached out to patient, he states there were "spots" on a brain scan, but he has never had an actual stroke.    Also, patient reports that he had the epidural injection this morning, thinks this clearance is for the next one, not yet scheduled, for about 3 months out?   CrCl >100 Platelet count 192  Per office protocol, patient can hold Xarelto  for 3 days prior to procedure.   Patient will not need bridging with Lovenox (enoxaparin) around procedure.  **This guidance is not considered finalized until pre-operative APP has relayed final recommendations.**

## 2023-08-20 NOTE — Telephone Encounter (Signed)
   Patient Name: Lucas [name].  DOB: 09-02-1956 MRN: [id number]  Primary Cardiologist: Dina Rich, MD  Clinical pharmacists have reviewed the patient's past medical history, labs, and current medications as part of preoperative protocol coverage. The following recommendations have been made:  Patient with diagnosis of atrial fibrillation on Xarelto for anticoagulation.     Procedure:   Thoracic Epidural Steriod injection    Date of Surgery:  Clearance TBD       CHA2DS2-VASc Score = 4  This indicates a 4.8% annual risk of stroke. The patient's score is based upon: CHF History: 0 HTN History: 1 Diabetes History: 1 Stroke History: 0 Vascular Disease History: 1 Age Score: 1 Gender Score: 0   Chart indicates history of stroke, but no other information found.  Reached out to patient, he states there were "spots" on a brain scan, but he has never had an actual stroke.     Also, patient reports that he had the epidural injection this morning, thinks this clearance is for the next one, not yet scheduled, for about 3 months out?    CrCl >100 Platelet count 192   Per office protocol, patient can hold Xarelto  for 3 days prior to procedure.   Patient will not need bridging with Lovenox (enoxaparin) around procedure. Please resume Xarelto as soon as possible postprocedure, at the discretion of the surgeon.      I will route this recommendation to the requesting party via Epic fax function and remove from pre-op pool.  Please call with questions.  Joylene Grapes, NP 08/20/2023, 11:41 AM

## 2023-08-25 ENCOUNTER — Telehealth: Payer: Self-pay | Admitting: Cardiology

## 2023-08-25 MED ORDER — FUROSEMIDE 20 MG PO TABS: 20.0000 mg | ORAL_TABLET | Freq: Every day | ORAL | 3 refills | Status: AC | PRN

## 2023-08-25 NOTE — Telephone Encounter (Signed)
Reports lower leg swelling started 3-4 weeks ago and says right leg has more swelling. Reports stinging and tingling in both legs. Denies drainage, redness, tenderness or pain in legs. Reports he weighs at work and is unsure of the actual readings. Reports he does not not at home.  Reports going into a-fib last Thursday night after getting a steroid shot in his back. Reports intermittent lightheadedness for 1 month but says he still has been driving and working. Reports falling backwards at work last night while walking up steps and was evaluated by the doctor who suggested he see cardiology for swelling in his legs. Reports intermittent chest tightness and SOB for the past month rated 2-3/10. Denies active chest pain. Medication reviewed. Has not taken furosemide and says he has misplaced it. Refill sent for furosemide to [facility] per request. Advised to use his prn lasix for leg swelling as needed. Advised to start weighing daily when he gets up out of bed, same time, same scale and same amount of clothes. Gave first available to see Philis Nettle on 09/14/2023 @9 :00 am. Advised if he develops worsening symptoms, to go to the ED for an evaluation. Verbalized understanding of plan.

## 2023-08-25 NOTE — Telephone Encounter (Signed)
Agree with your assessment and plan, well done I don't have anything to add.   Dominga Ferry MD

## 2023-08-25 NOTE — Telephone Encounter (Signed)
Pt called in stating his legs have been swelling for about 3 weeks now. He denies any SOB. He states it looks worse when he gets off of work and his doctor told him the right leg looks bigger than the other. He also stated that he fell this morning, he denies any bruising or discoloration. Please advise.

## 2023-09-14 ENCOUNTER — Ambulatory Visit: Payer: 59 | Attending: Nurse Practitioner | Admitting: Nurse Practitioner

## 2023-09-14 ENCOUNTER — Other Ambulatory Visit: Payer: Self-pay | Admitting: Nurse Practitioner

## 2023-09-14 ENCOUNTER — Encounter: Payer: Self-pay | Admitting: Nurse Practitioner

## 2023-09-14 VITALS — BP 118/70 | HR 73 | Ht 70.0 in | Wt 257.2 lb

## 2023-09-14 DIAGNOSIS — R0609 Other forms of dyspnea: Secondary | ICD-10-CM | POA: Diagnosis not present

## 2023-09-14 DIAGNOSIS — E785 Hyperlipidemia, unspecified: Secondary | ICD-10-CM

## 2023-09-14 DIAGNOSIS — I25119 Atherosclerotic heart disease of native coronary artery with unspecified angina pectoris: Secondary | ICD-10-CM

## 2023-09-14 DIAGNOSIS — I480 Paroxysmal atrial fibrillation: Secondary | ICD-10-CM

## 2023-09-14 DIAGNOSIS — I4892 Unspecified atrial flutter: Secondary | ICD-10-CM

## 2023-09-14 DIAGNOSIS — I712 Thoracic aortic aneurysm, without rupture, unspecified: Secondary | ICD-10-CM

## 2023-09-14 DIAGNOSIS — R600 Localized edema: Secondary | ICD-10-CM

## 2023-09-14 DIAGNOSIS — I7121 Aneurysm of the ascending aorta, without rupture: Secondary | ICD-10-CM

## 2023-09-14 DIAGNOSIS — I10 Essential (primary) hypertension: Secondary | ICD-10-CM

## 2023-09-14 DIAGNOSIS — I2510 Atherosclerotic heart disease of native coronary artery without angina pectoris: Secondary | ICD-10-CM

## 2023-09-14 MED ORDER — COMPRESSION BANDAGES KIT: 1.0000 | PACK | 0 refills | Status: DC

## 2023-09-14 MED ORDER — COMPRESSION BANDAGES KIT: 1.0000 | PACK | Freq: Every day | 0 refills | Status: DC

## 2023-09-14 NOTE — Progress Notes (Unsigned)
Cardiology Office Note:  .   Date:  09/14/2023 ID:  Lucas Ivory., DOB 21-Aug-1956, MRN [id number] PCP: Lucas [name], MD11/04/2023   [facility] Providers Cardiologist:  Dina Rich, MD Electrophysiologist:  Regan Lemming, MD    History of Present Illness: .   Lucas Allman. is a 67 y.o. male with a PMH of PAF, A-flutter, CAD, ascending aortic aneurysm, hypertension, hyperlipidemia, who presents today for leg swelling evaluation.  Last seen by Dr. Dina Rich on June 18, 2023.  He was doing well at that time from a cardiac perspective.  Today he presents for leg swelling evaluation.  He states he has noticed leg swelling recently, works long shifts standings on his feet. Has been taking Lasix as needed to help with this. Denies any chest pain, palpitations, syncope, presyncope, dizziness, orthopnea, PND, or significant weight changes, acute bleeding, or claudication.  Also admits to intermittent dyspnea on exertion at times.   ROS: Negative.  See HPI.  Studies Reviewed: [name]    A-fib ablation 08/2020: CONCLUSIONS: 1. Sinus rhythm upon presentation.   2. Successful electrical isolation and anatomical encircling of all four pulmonary veins with radiofrequency current.  A WACA approach was used 3. Additional left atrial ablation was performed with a standard box lesion created along the posterior wall of the left atrium 4. Atrial fibrillation successfully cardioverted to sinus rhythm. 5. No early apparent complications.  TEE 08/2020: 1. Left ventricular ejection fraction, by estimation, is 55 to 60%. The  left ventricle has normal function.   2. Right ventricular systolic function is normal. The right ventricular  size is normal.   3. No left atrial/left atrial appendage thrombus was detected.   4. The mitral valve is normal in structure. No evidence of mitral valve  regurgitation. No evidence of mitral stenosis.   5. The aortic valve is normal in structure.  Aortic valve regurgitation is  not visualized. No aortic stenosis is present.  Left heart cath 07/2020: 1.  Left dominant coronary anatomy with diffuse nonobstructive coronary artery disease, no targets for PCI. 2.  Small vessel disease involving the distal circumflex branch and a nondominant RCA 3.  Normal LVEDP   There is no significant LAD stenosis present.  The vessel is diffusely irregular without high-grade stenosis.  The proximal circumflex and its major branches also have no obstructive disease.  Recommend ongoing medical therapy.  Okay to start rivaroxaban back tonight.  CCTA 08/2020: IMPRESSION: 1.  No LA appendage thrombus noted.   2.  Pulmonary veins as noted above.   3. Coronary artery calcium score 2231 Agatston units. This places the patient in the 97th percentile for age and gender, suggesting high risk for future cardiac events.   4. Possible moderate stenosis in small, nondominant RCA, OM1, and left PDA. Will send for FFR to confirm.  Physical Exam:   VS:  BP 118/70   Pulse 73   Ht 5\' 10"  (1.778 m)   Wt 257 lb 3.2 oz (116.7 kg)   SpO2 95%   BMI 36.90 kg/m    Wt Readings from Last 3 Encounters:  09/14/23 257 lb 3.2 oz (116.7 kg)  08/06/23 259 lb (117.5 kg)  06/18/23 259 lb 12.8 oz (117.8 kg)    GEN: Obese, 67 y.o. male in no acute distress NECK: No JVD; No carotid bruits CARDIAC: S1/S2, RRR, no murmurs, rubs, gallops RESPIRATORY:  Clear to auscultation without rales, wheezing or rhonchi  ABDOMEN: Soft, non-tender, non-distended EXTREMITIES:  Trace, minimal leg edema on exam; No deformity   ASSESSMENT AND PLAN: .    Leg edema, DOE Intermittent leg swelling for the last few weeks, has been taking Lasix as needed.  Minimal leg edema noted on exam today.  Will obtain BMET, magnesium, and proBNP for further evaluation.  Will arrange echocardiogram for further evaluation for dyspnea on exertion.  Recommended compression stockings, will provide a Rx for him for  this today.  Low-salt, heart healthy diet and regular cardiovascular exercise encouraged.   PAF/A-flutter, s/p ablation in 2021 Denies any tachycardia or palpitations.  Heart rate is well-controlled today.  Continue current medication regimen.  Continue Xarelto.  He denies any bleeding issues and he is on appropriate dosage.   CAD Stable with no anginal symptoms. No indication for ischemic evaluation.  Not on aspirin due to being on Xarelto.  Continue current medication regimen. Heart healthy diet and regular cardiovascular exercise encouraged.   Ascending aortic aneurysm Previous CT scan in 2022 revealed thoracic aorta was not overtly aneurysmal on current study and measured 3.9 cm in maximum caliber in the ascending segment.  Previous CT angio chest has been previously arranged for further evaluation.  Will obtain BMET prior to study.  HTN BP stable. Discussed to monitor BP at home at least 2 hours after medications and sitting for 5-10 minutes.  No medication changes at this time. Heart healthy diet and regular cardiovascular exercise encouraged.   HLD LDL 09/2022 48.  He is due for upcoming labs with PCP.  No medication changes at this time. Heart healthy diet and regular cardiovascular exercise encouraged.    Dispo: Follow-up with Dr. Dina Rich as scheduled or sooner if anything changes.  Signed, Sharlene Dory, NP

## 2023-09-14 NOTE — Patient Instructions (Addendum)
Medication Instructions:  Your physician recommends that you continue on your current medications as directed. Please refer to the Current Medication list given to you today.  Labwork: In 1 week   Testing/Procedures: Your physician has requested that you have an echocardiogram. Echocardiography is a painless test that uses sound waves to create images of your heart. It provides your doctor with information about the size and shape of your heart and how well your heart's chambers and valves are working. This procedure takes approximately one hour. There are no restrictions for this procedure. Please do NOT wear cologne, perfume, aftershave, or lotions (deodorant is allowed). Please arrive 15 minutes prior to your appointment time.  Please note: We ask at that you not bring children with you during ultrasound (echo/ vascular) testing. Due to room size and safety concerns, children are not allowed in the ultrasound rooms during exams. Our front office staff cannot provide observation of children in our lobby area while testing is being conducted. An adult accompanying a patient to their appointment will only be allowed in the ultrasound room at the discretion of the ultrasound technician under special circumstances. We apologize for any inconvenience.  Follow-Up: Your physician recommends that you schedule a follow-up appointment in:   Any Other Special Instructions Will Be Listed Below (If Applicable).   If you need a refill on your cardiac medications before your next appointment, please call your pharmacy.

## 2023-09-16 LAB — BASIC METABOLIC PANEL
BUN/Creatinine Ratio: 14 (ref 10–24)
BUN: 13 mg/dL (ref 8–27)
CO2: 23 mmol/L (ref 20–29)
Calcium: 8.9 mg/dL (ref 8.6–10.2)
Chloride: 106 mmol/L (ref 96–106)
Creatinine, Ser: 0.94 mg/dL (ref 0.76–1.27)
Glucose: 190 mg/dL — ABNORMAL HIGH (ref 70–99)
Potassium: 4.8 mmol/L (ref 3.5–5.2)
Sodium: 147 mmol/L — ABNORMAL HIGH (ref 134–144)
eGFR: 88 mL/min/{1.73_m2} (ref >59)

## 2023-09-16 LAB — BRAIN NATRIURETIC PEPTIDE: BNP: 62.2 pg/mL (ref 0.0–100.0)

## 2023-09-16 LAB — MAGNESIUM: Magnesium: 2 mg/dL (ref 1.6–2.3)

## 2023-09-18 ENCOUNTER — Ambulatory Visit (HOSPITAL_COMMUNITY)
Admission: RE | Admit: 2023-09-18 | Discharge: 2023-09-18 | Disposition: A | Discharge status: Home / Self Care | Payer: 59 | Source: Ambulatory Visit | Attending: Cardiology | Admitting: Cardiology

## 2023-09-18 DIAGNOSIS — I712 Thoracic aortic aneurysm, without rupture, unspecified: Secondary | ICD-10-CM | POA: Insufficient documentation

## 2023-09-18 MED ORDER — IOHEXOL 350 MG/ML SOLN
100.0000 mL | Freq: Once | INTRAVENOUS | Status: AC | PRN
  Administered 2023-09-18: 100 mL via INTRAVENOUS

## 2023-09-30 ENCOUNTER — Ambulatory Visit: Payer: 59 | Attending: Nurse Practitioner

## 2023-09-30 DIAGNOSIS — R600 Localized edema: Secondary | ICD-10-CM

## 2023-10-01 LAB — ECHOCARDIOGRAM COMPLETE
AR max vel: 3.45 cm2
AV Area VTI: 3.65 cm2
AV Area mean vel: 3.38 cm2
AV Mean grad: 5 mm[Hg]
AV Peak grad: 8.8 mm[Hg]
Ao pk vel: 1.48 m/s
Area-P 1/2: 3.12 cm2
Calc EF: 70.2 %
MV VTI: 3.36 cm2
S' Lateral: 2.1 cm
Single Plane A2C EF: 72.2 %
Single Plane A4C EF: 64.1 %

## 2023-11-02 ENCOUNTER — Encounter: Payer: Self-pay | Admitting: Internal Medicine

## 2023-11-02 ENCOUNTER — Encounter: Payer: Self-pay | Admitting: Physician Assistant

## 2023-11-02 ENCOUNTER — Telehealth: Payer: Self-pay | Admitting: *Deleted

## 2023-11-02 ENCOUNTER — Ambulatory Visit: Payer: 59 | Admitting: Physician Assistant

## 2023-11-02 ENCOUNTER — Telehealth: Payer: Self-pay | Admitting: Cardiology

## 2023-11-02 VITALS — BP 122/68 | HR 61 | Ht 70.0 in | Wt 256.0 lb

## 2023-11-02 DIAGNOSIS — Z7901 Long term (current) use of anticoagulants: Secondary | ICD-10-CM | POA: Diagnosis not present

## 2023-11-02 DIAGNOSIS — E119 Type 2 diabetes mellitus without complications: Secondary | ICD-10-CM

## 2023-11-02 DIAGNOSIS — Z7985 Long-term (current) use of injectable non-insulin antidiabetic drugs: Secondary | ICD-10-CM | POA: Diagnosis not present

## 2023-11-02 DIAGNOSIS — I480 Paroxysmal atrial fibrillation: Secondary | ICD-10-CM | POA: Diagnosis not present

## 2023-11-02 DIAGNOSIS — Z860101 Personal history of adenomatous and serrated colon polyps: Secondary | ICD-10-CM

## 2023-11-02 MED ORDER — NA SULFATE-K SULFATE-MG SULF 17.5-3.13-1.6 GM/177ML PO SOLN: 1.0000 | Freq: Once | ORAL | 0 refills | Status: AC

## 2023-11-02 NOTE — Telephone Encounter (Signed)
Pt returning call to nurse

## 2023-11-02 NOTE — Telephone Encounter (Signed)
   Patient Name: Lucas [name].  DOB: 27-Feb-1956 MRN: [id number]  Primary Cardiologist: Dina Rich, MD  Clinical pharmacists have reviewed the patient's past medical history, labs, and current medications as part of preoperative protocol coverage. The following recommendations have been made:   Per office protocol, patient can hold Xarelto for 3 days prior to procedure.     Patient has CVA listed in chart but per patient spots seen on imaging. Patient has previously held anticoagulation 3 days.   I will route this recommendation to the requesting party via Epic fax function and remove from pre-op pool.  Please call with questions.  Denyce Robert, NP 11/02/2023, 1:42 PM

## 2023-11-02 NOTE — Patient Instructions (Signed)
You have been scheduled for a colonoscopy. Please follow written instructions given to you at your visit today.   Please pick up your prep supplies at the pharmacy within the next 1-3 days.  If you use inhalers (even only as needed), please bring them with you on the day of your procedure.  DO NOT TAKE 7 DAYS PRIOR TO TEST- Trulicity (dulaglutide) Ozempic, Wegovy (semaglutide) Mounjaro (tirzepatide) Bydureon Bcise (exanatide extended release)  DO NOT TAKE 1 DAY PRIOR TO YOUR TEST Rybelsus (semaglutide) Adlyxin (lixisenatide) Victoza (liraglutide) Byetta (exanatide) ________________________________________________________________  _______________________________________________________  If your blood pressure at your visit was 140/90 or greater, please contact your primary care physician to follow up on this.  _______________________________________________________  If you are age 59 or older, your body mass index should be between 23-30. Your Body mass index is 36.73 kg/m. If this is out of the aforementioned range listed, please consider follow up with your Primary Care Provider.  If you are age 37 or younger, your body mass index should be between 19-25. Your Body mass index is 36.73 kg/m. If this is out of the aformentioned range listed, please consider follow up with your Primary Care Provider.   ________________________________________________________  The [facility] providers would like to encourage you to use [facility] to communicate with providers for non-urgent requests or questions.  Due to long hold times on the telephone, sending your provider a message by [facility] may be a faster and more efficient way to get a response.  Please allow 48 business hours for a response.  Please remember that this is for non-urgent requests.  _______________________________________________________

## 2023-11-02 NOTE — Telephone Encounter (Signed)
Patient with diagnosis of afib on Xarelto for anticoagulation.    Procedure: colonoscopy Date of procedure: 11/10/23   CHA2DS2-VASc Score = 4   This indicates a 4.8% annual risk of stroke. The patient's score is based upon: CHF History: 0 HTN History: 1 Diabetes History: 1 Stroke History: 0 Vascular Disease History: 1 Age Score: 1 Gender Score: 0      CrCl 94 ml/min  Per office protocol, patient can hold Xarelto for 3 days prior to procedure.    Patient has CVA listed in chart but per patient spots seen on imaging. Patient has previously held anticoagulation 3 days.  **This guidance is not considered finalized until pre-operative APP has relayed final recommendations.**

## 2023-11-02 NOTE — Telephone Encounter (Signed)
Patient informed and verbalized understanding of plan. Echo results

## 2023-11-02 NOTE — Telephone Encounter (Signed)
[facility] Pre-operative Risk Assessment     Request for surgical clearance:     Endoscopy Procedure  What type of surgery is being performed?     colonoscopy  When is this surgery scheduled?     11/10/23  What type of clearance is required ?   Pharmacy  Are there any medications that need to be held prior to surgery and how long? Xarelto 2 days  Practice name and name of physician performing surgery?      [facility]  What is your office phone and fax number?      Phone- [phone]  Fax- [phone]  Anesthesia type (None, local, MAC, general) ?       MAC   Please route your response to Cristela Felt, CMA

## 2023-11-02 NOTE — Progress Notes (Signed)
Chief Complaint: Discuss colonoscopy  HPI:    [name] is a 67 year old male with a past medical history as listed below including A-fib and CAD on Xarelto (09/30/2023 echo with LVEF 70-75%) as well as Ozempic for type 2 diabetes who was referred to me by Ignatius Specking, MD for discussion of a colonoscopy.    12/22/2017 colonoscopy for history of adenomatous polyps with three 6-8 mm polyps in the descending colon and at the [facility] flexure, internal hemorrhoids and otherwise normal.  Pathology showed mixture of tubular adenomas and hyperplastic polyps.  EP recommended in 5 years.    11//24 BMP with a glucose of 190, sodium 147 and otherwise normal.    09/14/2023 office visit with cardiology for lower extremity edema.  At that time recommended compression stockings.    09/18/2023 CT angiography with no acute intrathoracic pathology, fatty liver and aortic atherosclerosis.    Today, patient presents to clinic companied by his wife and tells me he is just due for screening colonoscopy.  He has a history of polyps.  He is on Xarelto.  He has had to hold this before.  Also on Ozempic over the past year or 2.    Denies fever, chills, weight loss, change in bowel habits or abdominal pain.  Past Medical History:  Diagnosis Date   Arthritis    Bilateral carpal tunnel syndrome 08/16/2019   CAD (coronary artery disease)    a. LHC on 01/29/16 which revealed significant apical LAD stenosis, best treated medically. There was moderate disease in the D2 and mid nondominant RCA.[name] No PCI performed  b. 03/2018: repeat cath showing regression of his LAD stenosis now being at 50% with 60% mid RCA stenosis, 45% ostial LPDA stenosis, and 45% ostial second diagonal stenosis.   Cataract    Chronic back pain    Diabetes mellitus    type 2   GERD (gastroesophageal reflux disease)    Heart disease    Hyperlipidemia    Hypertension    Obesity    PAF (paroxysmal atrial fibrillation) (HCC) 01/2016   Sleep apnea     has CPAP machine but not wear   Tubular adenoma of colon 08/2012   Ulnar neuropathy at elbow 08/16/2019   Bilateral    Past Surgical History:  Procedure Laterality Date   ANKLE SURGERY Left 2012   tendon repair   ATRIAL FIBRILLATION ABLATION [name] 08/24/2020   Procedure: ATRIAL FIBRILLATION ABLATION;  Surgeon: Regan Lemming, MD;  Location: [facility] INVASIVE CV LAB;  Service: Cardiovascular;  Laterality: [name];   CARDIAC CATHETERIZATION [name] 01/29/2016   Procedure: Left Heart Cath and Coronary Angiography;  Surgeon: Lyn Records, MD;  Location: [facility] INVASIVE CV LAB;  Service: Cardiovascular;  Laterality: [name];   CARPAL TUNNEL RELEASE Right 09/06/2019   Procedure: CARPAL TUNNEL RELEASE;  Surgeon: Cindee Salt, MD;  Location: [facility];  Service: Orthopedics;  Laterality: Right;   CARPAL TUNNEL RELEASE Left 10/11/2019   Procedure: LEFT CARPAL TUNNEL RELEASE;  Surgeon: Cindee Salt, MD;  Location: [facility];  Service: Orthopedics;  Laterality: Left;  IV REGIONAL FOREARM BLOCK   CATARACT EXTRACTION W/PHACO  11/11/2012   Procedure: CATARACT EXTRACTION PHACO AND INTRAOCULAR LENS PLACEMENT (IOC);  Surgeon: Gemma Payor, MD;  Location: [facility]S;  Service: Ophthalmology;  Laterality: Right;  CDE:  5.56   CATARACT EXTRACTION W/PHACO Left 12/15/2013   Procedure: CATARACT EXTRACTION PHACO AND INTRAOCULAR LENS PLACEMENT (IOC);  Surgeon: Gemma Payor, MD;  Location: [facility]  ORS;  Service: Ophthalmology;  Laterality: Left;  CDE:13.36   CHOLECYSTECTOMY     CHONDROPLASTY Right 05/28/2017   Procedure: RIGHT KNEE CHONDROPLASTY;  Surgeon: Loreta Ave, MD;  Location: [facility];  Service: Orthopedics;  Laterality: Right;   COLONOSCOPY     CYSTOSCOPY W/ URETERAL STENT PLACEMENT     right   ELECTROPHYSIOLOGIC STUDY [name] 06/02/2016   Procedure: A-Flutter Ablation;  Surgeon: Will Jorja Loa, MD;  Location: [facility] INVASIVE CV LAB;  Service: Cardiovascular;  Laterality: [name];   KNEE  ARTHROSCOPY WITH MEDIAL MENISECTOMY Right 05/28/2017   Procedure: KNEE ARTHROSCOPY WITH MEDIAL MENISECTOMY WITH EXTENSIVE SYNOVECTOMY;  Surgeon: Loreta Ave, MD;  Location: [facility];  Service: Orthopedics;  Laterality: Right;   LEFT HEART CATH AND CORONARY ANGIOGRAPHY [name] 03/30/2018   Procedure: LEFT HEART CATH AND CORONARY ANGIOGRAPHY;  Surgeon: Marykay Lex, MD;  Location: [facility] INVASIVE CV LAB;  Service: Cardiovascular;  Laterality: [name];   LEFT HEART CATH AND CORONARY ANGIOGRAPHY [name] 07/27/2020   Procedure: LEFT HEART CATH AND CORONARY ANGIOGRAPHY;  Surgeon: Tonny Bollman, MD;  Location: [facility] INVASIVE CV LAB;  Service: Cardiovascular;  Laterality: [name];   left knee sugery Left    Arthroscopy   NASAL SINUS SURGERY     POLYPECTOMY     TEE WITHOUT CARDIOVERSION [name] 08/23/2020   Procedure: TRANSESOPHAGEAL ECHOCARDIOGRAM (TEE);  Surgeon: Vesta Mixer, MD;  Location: [facility]OS[facility];  Service: Cardiovascular;  Laterality: [name];   TONSILLECTOMY      Current Outpatient Medications  Medication Sig Dispense Refill   atorvastatin (LIPITOR) 80 MG tablet TAKE 1 TABLET BY MOUTH DAILY AT 6 PM. 90 tablet 1   Compression Bandages KIT 1 each by Does not apply route as directed. 1 kit 0   diltiazem (CARDIZEM CD) 240 MG 24 hr capsule TAKE 1 CAPSULE (240 MG TOTAL) BY MOUTH EVERY EVENING. 90 capsule 1   furosemide (LASIX) 20 MG tablet Take 1 tablet (20 mg total) by mouth daily as needed for fluid (swelling). 30 tablet 3   gabapentin (NEURONTIN) 600 MG tablet Take 600 mg by mouth 2 (two) times daily.     glimepiride (AMARYL) 4 MG tablet Take 4 mg by mouth 2 (two) times daily.      HYDROcodone-acetaminophen (NORCO/VICODIN) 5-325 MG tablet Take 1 tablet by mouth 2 (two) times daily as needed.     isosorbide mononitrate (IMDUR) 120 MG 24 hr tablet TAKE 1 TABLET BY MOUTH EVERY DAY 90 tablet 2   metoprolol succinate (TOPROL-XL) 100 MG 24 hr tablet TAKE 1 AND 1/2 TABLETS BY MOUTH DAILY WITH OR  IMMEDIATELY FOLLOWING A MEAL 135 tablet 1   nitroGLYCERIN (NITROSTAT) 0.4 MG SL tablet Place 1 tablet (0.4 mg total) under the tongue every 5 (five) minutes as needed for chest pain. 25 tablet 3   OZEMPIC, 0.25 OR 0.5 MG/DOSE, 2 MG/1.5ML SOPN Inject 1 mg into the skin every Saturday.     pantoprazole (PROTONIX) 40 MG tablet Take 40 mg by mouth daily before breakfast.      SYNJARDY XR 12.03-999 MG TB24 Take 1 tablet by mouth in the morning and at bedtime.     XARELTO 20 MG TABS tablet TAKE 1 TABLET BY MOUTH DAILY WITH SUPPER 30 tablet 5   No current facility-administered medications for this visit.   Facility-Administered Medications Ordered in Other Visits  Medication Dose Route Frequency Provider Last Rate Last Admin   0.9 %  sodium chloride infusion   Intravenous Continuous  PRN Laruth Bouchard., CRNA   New Bag at 08/31/20 0745    Allergies as of 11/02/2023 - Review Complete 11/02/2023  Allergen Reaction Noted   Bee venom Swelling 08/10/2012   Codeine Nausea Only 08/10/2012    Family History  Problem Relation Age of Onset   Heart murmur Mother    Aneurysm Mother    Hypertension Mother    Cirrhosis Father    Heart attack Sister    Diabetes Sister    Aneurysm Maternal Grandmother    Stroke Maternal Grandmother    Heart attack Paternal Grandmother    Stroke Sister    Colon cancer Neg Hx    Rectal cancer Neg Hx    Stomach cancer Neg Hx     Social History   Socioeconomic History   Marital status: Married    Spouse name: Not on file   Number of children: Not on file   Years of education: 12   Highest education level: Not on file  Occupational History   Not on file  Tobacco Use   Smoking status: Never   Smokeless tobacco: Never  Vaping Use   Vaping status: Never Used  Substance and Sexual Activity   Alcohol use: Yes    Alcohol/week: 0.0 standard drinks of alcohol    Comment: rare occasion   Drug use: No   Sexual activity: Yes    Birth control/protection: None   Other Topics Concern   Not on file  Social History Narrative   Right handed   Caffeine~ 1-2 cups per day   Lives at home with wife Zella Ball    Social Drivers of Health   Financial Resource Strain: Not on file  Food Insecurity: Not on file  Transportation Needs: Not on file  Physical Activity: Not on file  Stress: Not on file  Social Connections: Not on file  Intimate Partner Violence: Not on file    Review of Systems:    Constitutional: No weight loss, fever or chills Skin: No rash  Cardiovascular: No chest pain  Respiratory: No SOB  Gastrointestinal: See HPI and otherwise negative Genitourinary: No dysuria  Neurological: No headache, dizziness or syncope Musculoskeletal: No new muscle or joint pain Hematologic: No bleeding  Psychiatric: No history of depression or anxiety   Physical Exam:  Vital signs: BP 122/68   Pulse 61   Ht 5\' 10"  (1.778 m)   Wt 256 lb (116.1 kg)   BMI 36.73 kg/m    Constitutional:   Pleasant overweight Caucasian male appears to be in NAD, Well developed, Well nourished, alert and cooperative Head:  Normocephalic and atraumatic. Eyes:   PEERL, EOMI. No icterus. Conjunctiva pink. Ears:  Normal auditory acuity. Neck:  Supple Throat: Oral cavity and pharynx without inflammation, swelling or lesion.  Respiratory: Respirations even and unlabored. Lungs clear to auscultation bilaterally.   No wheezes, crackles, or rhonchi.  Cardiovascular: Normal S1, S2. No MRG. Regular rate and rhythm. No peripheral edema, cyanosis or pallor.  Gastrointestinal:  Soft, nondistended, nontender. No rebound or guarding. Normal bowel sounds. No appreciable masses or hepatomegaly. Rectal:  Not performed.  Msk:  Symmetrical without gross deformities. Without edema, no deformity or joint abnormality.  Neurologic:  Alert and  oriented x4;  grossly normal neurologically.  Skin:   Dry and intact without significant lesions or rashes. Psychiatric: Demonstrates good judgement  and reason without abnormal affect or behaviors.  RELEVANT LABS AND IMAGING: CBC    Component Value Date/Time   WBC 10.1 08/22/2020 0820  WBC 10.7 (H) 07/04/2020 0947   RBC 4.85 08/22/2020 0820   RBC 5.14 07/04/2020 0947   HGB 15.1 08/22/2020 0820   HCT 44.1 08/22/2020 0820   PLT 178 08/22/2020 0820   MCV 91 08/22/2020 0820   MCH 31.1 08/22/2020 0820   MCH 30.9 07/04/2020 0947   MCHC 34.2 08/22/2020 0820   MCHC 33.5 07/04/2020 0947   RDW 15.7 (H) 08/22/2020 0820   LYMPHSABS 4.3 (H) 07/23/2020 1044   MONOABS 0.8 03/25/2018 1508   EOSABS 0.2 07/23/2020 1044   BASOSABS 0.1 07/23/2020 1044    CMP     Component Value Date/Time   NA 147 (H) 09/14/2023 1043   K 4.8 09/14/2023 1043   CL 106 09/14/2023 1043   CO2 23 09/14/2023 1043   GLUCOSE 190 (H) 09/14/2023 1043   GLUCOSE 176 (H) 09/30/2021 1111   BUN 13 09/14/2023 1043   CREATININE 0.94 09/14/2023 1043   CALCIUM 8.9 09/14/2023 1043   PROT 6.9 05/09/2020 0727   ALBUMIN 3.9 05/09/2020 0727   AST 32 05/09/2020 0727   ALT 35 05/09/2020 0727   ALKPHOS 125 05/09/2020 0727   BILITOT 1.0 05/09/2020 0727   GFRNONAA >60 09/30/2021 1111   GFRAA 105 08/22/2020 0820    Assessment: 1.  History of adenomatous polyps: Last colonoscopy in 2019 with repeat recommended in 5 years 2.  A-fib on Xarelto 3.  Type 2 diabetes: On Ozempic  Plan: 1.  Patient was wanting to get his colonoscopy and before the end of the year.  He was set up with Dr. Rhea Belton who will be his primary GI physician going forward.  Scheduled for a surveillance colonoscopy given history of adenomatous polyps in the [facility].  Did provide the patient a detailed list of risks for the procedure and he agrees to proceed. Patient is appropriate for endoscopic procedure(s) in the ambulatory ([facility]) setting.  2.  Patient was advised to hold his Xarelto for 2 days prior to time of procedure.  We will communicate was prescribed physician to ensure this is acceptable for him. 3.  Also  discussed holding Ozempic for 1 week prior to time of procedures. 4.  Patient to follow in clinic per recommendations after time of procedure.  Hyacinth Meeker, PA-C [facility] 11/02/2023, 10:46 AM  Cc: Ignatius Specking, MD

## 2023-11-03 ENCOUNTER — Encounter: Payer: Self-pay | Admitting: *Deleted

## 2023-11-10 ENCOUNTER — Ambulatory Visit: Payer: 59 | Admitting: Internal Medicine

## 2023-11-10 ENCOUNTER — Encounter: Payer: Self-pay | Admitting: Internal Medicine

## 2023-11-10 VITALS — BP 151/81 | HR 63 | Temp 97.9°F | Resp 11 | Ht 70.0 in | Wt 256.0 lb

## 2023-11-10 DIAGNOSIS — D122 Benign neoplasm of ascending colon: Secondary | ICD-10-CM

## 2023-11-10 DIAGNOSIS — D124 Benign neoplasm of descending colon: Secondary | ICD-10-CM

## 2023-11-10 DIAGNOSIS — Z1211 Encounter for screening for malignant neoplasm of colon: Secondary | ICD-10-CM

## 2023-11-10 DIAGNOSIS — K648 Other hemorrhoids: Secondary | ICD-10-CM

## 2023-11-10 DIAGNOSIS — D123 Benign neoplasm of transverse colon: Secondary | ICD-10-CM

## 2023-11-10 DIAGNOSIS — C186 Malignant neoplasm of descending colon: Secondary | ICD-10-CM | POA: Diagnosis not present

## 2023-11-10 DIAGNOSIS — Z860101 Personal history of adenomatous and serrated colon polyps: Secondary | ICD-10-CM | POA: Diagnosis not present

## 2023-11-10 DIAGNOSIS — D374 Neoplasm of uncertain behavior of colon: Secondary | ICD-10-CM

## 2023-11-10 MED ORDER — SODIUM CHLORIDE 0.9 % IV SOLN: 500.0000 mL | INTRAVENOUS | Status: DC

## 2023-11-10 NOTE — Op Note (Signed)
 [facility] Patient Name: Lucas [name] Procedure Date: 11/10/2023 9:52 AM MRN: [id number] Endoscopist: Gordy CHRISTELLA Starch , MD, [id number] Age: 67 Referring MD:  Date of Birth: October 04, 1956 Gender: Male Account #: [id number] Procedure:                Colonoscopy Indications:              High risk colon cancer surveillance: Personal                            history of non-advanced adenomas, Last colonoscopy:                            February 2019 (TA x 1); 2013 (TA x 2) Medicines:                Monitored Anesthesia Care Procedure:                Pre-Anesthesia Assessment:                           - Prior to the procedure, a History and Physical                            was performed, and patient medications and                            allergies were reviewed. The patient's tolerance of                            previous anesthesia was also reviewed. The risks                            and benefits of the procedure and the sedation                            options and risks were discussed with the patient.                            All questions were answered, and informed consent                            was obtained. Prior Anticoagulants: The patient has                            taken Xarelto  (rivaroxaban ), last dose was 2 days                            prior to procedure. ASA Grade Assessment: III - A                            patient with severe systemic disease. After                            reviewing the risks and benefits, the patient was  deemed in satisfactory condition to undergo the                            procedure.                           After obtaining informed consent, the colonoscope                            was passed under direct vision. Throughout the                            procedure, the patient's blood pressure, pulse, and                            oxygen saturations were monitored continuously. The                             Olympus CF-HQ190L ([id number]) Colonoscope was                            introduced through the anus and advanced to the                            cecum, identified by appendiceal orifice and                            ileocecal valve. The colonoscopy was performed                            without difficulty. The patient tolerated the                            procedure well. The quality of the bowel                            preparation was good. The ileocecal valve,                            appendiceal orifice, and rectum were photographed. Scope In: 10:07:11 AM Scope Out: 10:33:16 AM Scope Withdrawal Time: 0 hours 23 minutes 41 seconds  Total Procedure Duration: 0 hours 26 minutes 5 seconds  Findings:                 The digital rectal exam was normal.                           Two sessile polyps were found in the ascending                            colon. The polyps were 4 to 6 mm in size. These                            polyps were removed with a cold snare. Resection  and retrieval were complete.                           A 5 mm polyp was found in the transverse colon. The                            polyp was sessile. The polyp was removed with a                            cold snare. Resection and retrieval were complete.                           A 1 mm polyp was found in the transverse colon. The                            polyp was sessile. The polyp was removed with a                            cold biopsy forceps. Resection and retrieval were                            complete.                           A 9-10 mm polyp was found in the descending colon.                            The polyp was sessile. The polyp was removed with a                            cold snare with some difficulty due to possible                            involvement of the submucosa. Resection and                            retrieval were  complete. Area distal to polypectomy                            site was tattooed with an injection of 2 mL of Spot                            (carbon black).                           Two sessile polyps were found in the descending                            colon. The polyps were 4 to 6 mm in size. These                            polyps were removed with a cold snare. Resection  and retrieval were complete.                           Internal hemorrhoids were found during                            retroflexion. The hemorrhoids were small. Complications:            No immediate complications. Estimated Blood Loss:     Estimated blood loss was minimal. Impression:               - Two 4 to 6 mm polyps in the ascending colon,                            removed with a cold snare. Resected and retrieved.                           - One 5 mm polyp in the transverse colon, removed                            with a cold snare. Resected and retrieved.                           - One 1 mm polyp in the transverse colon, removed                            with a cold biopsy forceps. Resected and retrieved.                           - One 9-10 mm polyp in the descending colon,                            removed with a cold snare. Possible submucosal                            involvement. Resected and retrieved. Tattooed                            distally.                           - Two 4 to 6 mm polyps in the descending colon,                            removed with a cold snare. Resected and retrieved.                           - Small internal hemorrhoids. Recommendation:           - Patient has a contact number available for                            emergencies. The signs and symptoms of potential  delayed complications were discussed with the                            patient. Return to normal activities tomorrow.                             Written discharge instructions were provided to the                            patient.                           - Resume previous diet.                           - Continue present medications.                           - Await pathology results.                           - Repeat colonoscopy is recommended for                            surveillance. The colonoscopy date will be                            determined after pathology results from today's                            exam become available for review.                           - Resume Xarelto  (rivaroxaban ) at prior dose in 2                            days. Refer to managing physician for further                            adjustment of therapy. Gordy CHRISTELLA Starch, MD 11/10/2023 10:41:26 AM This report has been signed electronically.

## 2023-11-10 NOTE — Patient Instructions (Signed)

## 2023-11-10 NOTE — Progress Notes (Signed)
Vss nad trans to pacu 

## 2023-11-10 NOTE — Progress Notes (Signed)
Called to room to assist during endoscopic procedure.  Patient ID and intended procedure confirmed with present staff. Received instructions for my participation in the procedure from the performing physician.  

## 2023-11-10 NOTE — Progress Notes (Signed)
 See office note dated 11/02/2023 for details and current H&P  Patient presenting for outpatient surveillance colonoscopy due to history of adenomatous colon polyps  Xarelto  on hold x 2 days  She remains appropriate for [facility] colonoscopy today Last exam February 2019 with Dr. Aneita

## 2023-11-13 ENCOUNTER — Telehealth: Payer: Self-pay

## 2023-11-13 NOTE — Telephone Encounter (Signed)
  Follow up Call-     11/10/2023    9:19 AM  Call back number  Post procedure Call Back phone  # [phone]  Permission to leave phone message Yes     Patient questions:  Do you have a fever, pain , or abdominal swelling? No. Pain Score  0 *  Have you tolerated food without any problems? Yes.    Have you been able to return to your normal activities? Yes.    Do you have any questions about your discharge instructions: Diet   No. Medications  No. Follow up visit  No.  Do you have questions or concerns about your Care? No.  Actions: * If pain score is 4 or above: No action needed, pain <4.

## 2023-11-16 ENCOUNTER — Telehealth: Payer: Self-pay | Admitting: Internal Medicine

## 2023-11-16 ENCOUNTER — Other Ambulatory Visit: Payer: Self-pay

## 2023-11-16 DIAGNOSIS — C186 Malignant neoplasm of descending colon: Secondary | ICD-10-CM

## 2023-11-16 LAB — SURGICAL PATHOLOGY

## 2023-11-16 NOTE — Telephone Encounter (Signed)
 Spoke with pathologist and he stated that part 3 on the path report had 3 polyps from the descending colon. There was 1 fragment that showed invasive moderately differentiated adenocarcinoma. States he will be signing off on the report shortly.

## 2023-11-16 NOTE — Telephone Encounter (Signed)
See pathology result note 

## 2023-11-16 NOTE — Telephone Encounter (Signed)
 I received a call from pathology for this patient and they are requesting to speak to Dr. Rhea Belton about they patient results. Please advise. A  good call back number is [phone]

## 2023-11-17 ENCOUNTER — Encounter: Payer: Self-pay | Admitting: *Deleted

## 2023-11-17 NOTE — Progress Notes (Signed)
 PATIENT NAVIGATOR PROGRESS NOTE  Name: Lucas [name]. Date: 11/17/2023 MRN: [id number]  DOB: Oct 01, 1956   Reason for visit:  New Patient appt  Comments:  Called and spoke with patient regarding referral from Dr Albertus.  He is awaiting call from surgeon's office for appt  He is scheduled for New pt appt with Dr Cloretta on 11/26/23 at 1:40pm  Directions to building and parking reviewed with pt as well as contact number to call with any issues or questions  MMR and MSI testing requested from [id number] as well    Time spent counseling/coordinating care: 30-45 minutes

## 2023-11-20 ENCOUNTER — Ambulatory Visit (HOSPITAL_COMMUNITY)
Admission: RE | Admit: 2023-11-20 | Discharge: 2023-11-20 | Disposition: A | Discharge status: Home / Self Care | Payer: 59 | Source: Ambulatory Visit | Attending: Internal Medicine | Admitting: Internal Medicine

## 2023-11-20 DIAGNOSIS — C186 Malignant neoplasm of descending colon: Secondary | ICD-10-CM | POA: Diagnosis present

## 2023-11-20 MED ORDER — IOHEXOL 300 MG/ML  SOLN: 30.0000 mL | Freq: Once | INTRAMUSCULAR | Status: DC | PRN

## 2023-11-20 MED ORDER — IOHEXOL 300 MG/ML  SOLN
100.0000 mL | Freq: Once | INTRAMUSCULAR | Status: AC | PRN
  Administered 2023-11-20: 100 mL via INTRAVENOUS

## 2023-11-25 ENCOUNTER — Other Ambulatory Visit: Payer: Self-pay | Admitting: Cardiology

## 2023-11-26 ENCOUNTER — Inpatient Hospital Stay: Payer: 59 | Attending: Oncology | Admitting: Oncology

## 2023-11-26 VITALS — BP 117/58 | HR 64 | Temp 98.2°F | Resp 18 | Ht 70.0 in | Wt 258.6 lb

## 2023-11-26 DIAGNOSIS — C186 Malignant neoplasm of descending colon: Secondary | ICD-10-CM | POA: Diagnosis not present

## 2023-11-26 NOTE — Progress Notes (Signed)
[facility] New Patient Consult   Requesting MD: Beverley Fiedler, Md [address] [place],  [place] [address]   Lucas [name]. 68 y.o.  05-25-1956    Reason for Consult: Colon cancer   HPI: Lucas [name] has a history of colon polyps.  He underwent a surveillance colonoscopy by Dr. Rhea Belton 11/10/2023.  Sessile polyps were removed from the ascending colon, transverse colon, and descending colon.  A 9-10 mm polyp was found in the descending colon.  The polyp was sessile.  The polyp was removed with a cold snare with "difficulty "due to possible involvement of the submucosa.  Resection and retrieval were complete.  The area distal to the polypectomy site was tattooed.  The pathology revealed a tubular adenoma and sessile serrated polyp in the ascending colon.  The transverse colon polyps returned as tubular adenomas.  The descending colon "polyps "contain invasive moderately differentiated adenocarcinoma and abundant fragments of tubular adenomatous epithelium with high-grade dysplasia.  There were also separate fragments of colonic mucosa with low-grade dysplasia.  Lucas [name] underwent a aging CT abdomen/pelvis 11/20/2023.  A 1.4 cm indeterminate hyperattenuating lesion was noted in the right hepatic dome.  No other focal liver lesions.  No colonic mass.  Past Medical History:  Diagnosis Date   Arthritis    Bilateral carpal tunnel syndrome 08/16/2019   CAD (coronary artery disease)    a. LHC on 01/29/16 which revealed significant apical LAD stenosis, best treated medically. There was moderate disease in the D2 and mid nondominant RCA.[name] No PCI performed  b. 03/2018: repeat cath showing regression of his LAD stenosis now being at 50% with 60% mid RCA stenosis, 45% ostial LPDA stenosis, and 45% ostial second diagonal stenosis.   Cataract    Chronic back pain    Diabetes mellitus    type 2   GERD (gastroesophageal reflux disease)    Heart disease    Hyperlipidemia    Hypertension     Obesity    PAF (paroxysmal atrial fibrillation) (HCC) 01/2016   Sleep apnea    has CPAP machine but not wear   Tubular adenoma of colon 08/2012   Ulnar neuropathy at elbow 08/16/2019   Bilateral    .  Aortic aneurysm  Past Surgical History:  Procedure Laterality Date   ANKLE SURGERY Left 2012   tendon repair   ATRIAL FIBRILLATION ABLATION [name] 08/24/2020   Procedure: ATRIAL FIBRILLATION ABLATION;  Surgeon: Regan Lemming, MD;  [facility]: [facility];  Service: Cardiovascular;  Laterality: [name];   CARDIAC CATHETERIZATION [name] 01/29/2016   Procedure: Left Heart Cath and Coronary Angiography;  Surgeon: Lyn Records, MD;  Location: [facility] INVASIVE CV LAB;  Service: Cardiovascular;  Laterality: [name];   CARPAL TUNNEL RELEASE Right 09/06/2019   Procedure: CARPAL TUNNEL RELEASE;  Surgeon: Cindee Salt, MD;  Location: [facility];  Service: Orthopedics;  Laterality: Right;   CARPAL TUNNEL RELEASE Left 10/11/2019   Procedure: LEFT CARPAL TUNNEL RELEASE;  Surgeon: Cindee Salt, MD;  Location: [facility];  Service: Orthopedics;  Laterality: Left;  IV REGIONAL FOREARM BLOCK   CATARACT EXTRACTION W/PHACO  11/11/2012   Procedure: CATARACT EXTRACTION PHACO AND INTRAOCULAR LENS PLACEMENT (IOC);  Surgeon: Gemma Payor, MD;  Location: [facility];  Service: Ophthalmology;  Laterality: Right;  CDE:  5.56   CATARACT EXTRACTION W/PHACO Left 12/15/2013   Procedure: CATARACT EXTRACTION PHACO AND INTRAOCULAR LENS PLACEMENT (IOC);  Surgeon: Gemma Payor, MD;  Location: [facility];  Service: Ophthalmology;  Laterality: Left;  CDE:13.36   CHOLECYSTECTOMY     CHONDROPLASTY Right 05/28/2017   Procedure: RIGHT KNEE CHONDROPLASTY;  Surgeon: Loreta Ave, MD;  Location: [facility];  Service: Orthopedics;  Laterality: Right;   COLONOSCOPY     CYSTOSCOPY W/ URETERAL STENT PLACEMENT     right   ELECTROPHYSIOLOGIC STUDY [name] 06/02/2016   Procedure: A-Flutter Ablation;  Surgeon: Will Jorja Loa, MD;  Location: [facility];  Service: Cardiovascular;  Laterality: [name];   KNEE ARTHROSCOPY WITH MEDIAL MENISECTOMY Right 05/28/2017   Procedure: KNEE ARTHROSCOPY WITH MEDIAL MENISECTOMY WITH EXTENSIVE SYNOVECTOMY;  Surgeon: Loreta Ave, MD;  Location: [facility];  Service: Orthopedics;  Laterality: Right;   LEFT HEART CATH AND CORONARY ANGIOGRAPHY [name] 03/30/2018   Procedure: LEFT HEART CATH AND CORONARY ANGIOGRAPHY;  Surgeon: Marykay Lex, MD;  Location: [facility] INVASIVE CV LAB;  Service: Cardiovascular;  Laterality: [name];   LEFT HEART CATH AND CORONARY ANGIOGRAPHY [name] 07/27/2020   Procedure: LEFT HEART CATH AND CORONARY ANGIOGRAPHY;  Surgeon: Tonny Bollman, MD;  Location: [name] INVASIVE CV LAB;  Service: Cardiovascular;  Laterality: [name];   left knee sugery Left    Arthroscopy   NASAL SINUS SURGERY     POLYPECTOMY     TEE WITHOUT CARDIOVERSION [name] 08/23/2020   Procedure: TRANSESOPHAGEAL ECHOCARDIOGRAM (TEE);  Surgeon: Elease Hashimoto Deloris Ping, MD;  Location: [facility] ENDOSCOPY;  Service: Cardiovascular;  Laterality: [name];   TONSILLECTOMY      Medications: Reviewed  Allergies:  Allergies  Allergen Reactions   Bee Venom Swelling   Codeine Nausea Only    Family history: Maternal uncle had prostate cancer.  Maternal aunts had "cancer ".  A nephew had colon cancer in his 85s  Social History:   He lives with his wife in [place].  He works in a Production assistant, radio.  He does not use cigarettes.  Occasional alcohol use.  No transfusion history.  No risk factor for HIV or hepatitis.  ROS:   Positives include: Decreased stool caliber and rectal urgency for approximately 5 months, right groin pain a few nights ago-resolved, bilateral lower leg swelling for 4-5 months  A complete ROS was otherwise negative.  Physical Exam:  Blood pressure (!) 117/58, pulse 64, temperature 98.2 F (36.8 C), temperature source Temporal, resp. rate 18, height 5\' 10"  (1.778 m), weight 258 lb 9.6 oz  (117.3 kg), SpO2 98%.  HEENT: Oral cavity without visible mass, neck without mass Lungs: Clear bilaterally Cardiac: Regular rate and rhythm Abdomen: No hepatosplenomegaly, no mass, mild tenderness in the left lower abdomen GU: Uncircumcised male, testes without mass Vascular: Trace pitting edema to lower leg bilaterally Lymph nodes: No cervical, supraclavicular, axillary, or inguinal nodes Neurologic: Alert and oriented, the motor exam appears intact in the upper lower extremities bilaterally Skin: No rash Musculoskeletal: No spine tenderness   LAB:  CBC  Lab Results  Component Value Date   WBC 10.1 08/22/2020   HGB 15.1 08/22/2020   HCT 44.1 08/22/2020   MCV 91 08/22/2020   PLT 178 08/22/2020   NEUTROABS 6.2 07/23/2020        CMP  Lab Results  Component Value Date   NA 147 (H) 09/14/2023   K 4.8 09/14/2023   CL 106 09/14/2023   CO2 23 09/14/2023   GLUCOSE 190 (H) 09/14/2023   BUN 13 09/14/2023   CREATININE 0.94 09/14/2023   CALCIUM 8.9 09/14/2023   PROT 6.9 05/09/2020   ALBUMIN 3.9 05/09/2020  AST 32 05/09/2020   ALT 35 05/09/2020   ALKPHOS 125 05/09/2020   BILITOT 1.0 05/09/2020   GFRNONAA >60 09/30/2021   GFRAA 105 08/22/2020     Imaging: CT images from 11/20/2023 reviewed with Lucas [name] and his wife    Assessment/Plan:   Adenocarcinoma involving a descending colon polyp Colonoscopy 11/10/2023 filled multiple colon polyps including 3 descending colon polyps.  1 descending colon polyp measured 9-10 mm with possible submucosal involvement, resected and retrieved, tattooed, normal mismatch repair protein expression CT Abdo/pelvis 11/20/2023-no colon mass, indeterminate hyperattenuating lesion in the right hepatic dome CT chest 09/18/2023: Indeterminate 13 mm enhancing focus in the liver dome-hemangioma? Diabetes Atrial fibrillation/atrial flutter-status post ablation in 2021 CAD Ascending aortic aneurysm Hypertension Hyperlipidemia History of colon  polyps-multiple adenomatous polyps on colonoscopy 11/10/2023 Chronic back pain Sleep apnea   Disposition:   Lucas [name] has been diagnosed with invasive adenocarcinoma involving a descending colon polyp.  It is unclear whether there is remaining invasive carcinoma.  The area of the dominant polyp resection was tattooed.  There is no clinical evidence of metastatic disease.  An isolated liver lesion is most likely a hemangioma.  The lesion was present in November 2024 and on review of multiple previous CTs the lesion appears to have been present on a chest CT in 2021.  He has been referred to Dr. Michaell Cowing.  We will request an appointment with Dr. Michaell Cowing for soon as possible.  I discussed the general treatment of colon cancer with Lucas [name] and his wife.  I will plan to see him after surgery to review the surgery pathology and recommend adjuvant therapy as indicated.  He does not appear to have hereditary nonpolyposis cancer syndrome based on the intact mismatch repair protein testing.  However his family members are increased risk for developing colorectal cancer and should receive appropriate screening.  I will present his case at the GI tumor conference next week.  I recommend a baseline CEA with preoperative labs.  Thornton Papas, MD  11/26/2023, 2:04 PM

## 2023-11-27 ENCOUNTER — Ambulatory Visit: Payer: Self-pay | Admitting: Surgery

## 2023-11-27 ENCOUNTER — Other Ambulatory Visit: Payer: Self-pay | Admitting: Cardiology

## 2023-12-02 ENCOUNTER — Other Ambulatory Visit: Payer: Self-pay | Admitting: *Deleted

## 2023-12-02 NOTE — Progress Notes (Signed)
The proposed treatment discussed in conference is for discussion purpose only and is not a binding recommendation.  The patients have not been physically examined, or presented with their treatment options.  Therefore, final treatment plans cannot be decided.  

## 2023-12-03 ENCOUNTER — Ambulatory Visit: Payer: Self-pay | Admitting: Surgery

## 2023-12-03 DIAGNOSIS — R739 Hyperglycemia, unspecified: Secondary | ICD-10-CM

## 2023-12-04 ENCOUNTER — Telehealth: Payer: Self-pay | Admitting: *Deleted

## 2023-12-04 ENCOUNTER — Telehealth (HOSPITAL_BASED_OUTPATIENT_CLINIC_OR_DEPARTMENT_OTHER): Payer: Self-pay

## 2023-12-04 NOTE — Telephone Encounter (Signed)
Patient with diagnosis of atrial fibrillation on Xarelto for anticoagulation.   Procedure: Colon Cancer Surgery  Date of procedure: 12/10/2023   CHA2DS2-VASc Score = 4   This indicates a 4.8% annual risk of stroke. The patient's score is based upon: CHF History: 0 HTN History: 1 Diabetes History: 1 Stroke History: 0 Vascular Disease History: 1 Age Score: 1 Gender Score: 0     CrCl >100 mL/min Platelet count 182 (10/07/23 from [facility])  Per office protocol, patient can hold Xarelto for 3 days prior to procedure.     **This guidance is not considered finalized until pre-operative APP has relayed final recommendations.**

## 2023-12-04 NOTE — Telephone Encounter (Signed)
Patient scheduled 12-07-23

## 2023-12-04 NOTE — Telephone Encounter (Signed)
   Pre-operative Risk Assessment    Patient Name: Lucas [name].  DOB: 04/10/1956 MRN: [id number]   Date of last office visit: 09/14/23 Date of next office visit: 12/24/23   Request for Surgical Clearance    Procedure:   Colon Cancer Surgery  Date of Surgery:  Clearance 12/10/23                                Surgeon:  Karie Soda, MD Surgeon's Group or Practice Name:  [facility] Phone number:  [phone] Fax number:  [phone]   Type of Clearance Requested:   - Medical  - Pharmacy:  Hold Rivaroxaban (Xarelto)     Type of Anesthesia:  General    Additional requests/questions:    Garrel Ridgel   12/04/2023, 10:16 AM

## 2023-12-04 NOTE — Telephone Encounter (Signed)
  Patient Consent for Virtual Visit         Lucas [name]. has provided verbal consent on 12/04/2023 for a virtual visit (video or telephone).   CONSENT FOR VIRTUAL VISIT FOR:  Lucas [name].  By participating in this virtual visit I agree to the following:  I hereby voluntarily request, consent and authorize [facility] and its employed or contracted physicians, physician assistants, nurse practitioners or other licensed health care professionals (the Practitioner), to provide me with telemedicine health care services (the "Services") as deemed necessary by the treating Practitioner. I acknowledge and consent to receive the Services by the Practitioner via telemedicine. I understand that the telemedicine visit will involve communicating with the Practitioner through live audiovisual communication technology and the disclosure of certain medical information by electronic transmission. I acknowledge that I have been given the opportunity to request an in-person assessment or other available alternative prior to the telemedicine visit and am voluntarily participating in the telemedicine visit.  I understand that I have the right to withhold or withdraw my consent to the use of telemedicine in the course of my care at any time, without affecting my right to future care or treatment, and that the Practitioner or I may terminate the telemedicine visit at any time. I understand that I have the right to inspect all information obtained and/or recorded in the course of the telemedicine visit and may receive copies of available information for a reasonable fee.  I understand that some of the potential risks of receiving the Services via telemedicine include:  Delay or interruption in medical evaluation due to technological equipment failure or disruption; Information transmitted may not be sufficient (e.g. poor resolution of images) to allow for appropriate medical decision making by the  Practitioner; and/or  In rare instances, security protocols could fail, causing a breach of personal health information.  Furthermore, I acknowledge that it is my responsibility to provide information about my medical history, conditions and care that is complete and accurate to the best of my ability. I acknowledge that Practitioner's advice, recommendations, and/or decision may be based on factors not within their control, such as incomplete or inaccurate data provided by me or distortions of diagnostic images or specimens that may result from electronic transmissions. I understand that the practice of medicine is not an exact science and that Practitioner makes no warranties or guarantees regarding treatment outcomes. I acknowledge that a copy of this consent can be made available to me via my patient portal [facility] MyChart), or I can request a printed copy by calling the office of [facility].    I understand that my insurance will be billed for this visit.   I have read or had this consent read to me. I understand the contents of this consent, which adequately explains the benefits and risks of the Services being provided via telemedicine.  I have been provided ample opportunity to ask questions regarding this consent and the Services and have had my questions answered to my satisfaction. I give my informed consent for the services to be provided through the use of telemedicine in my medical care

## 2023-12-04 NOTE — Telephone Encounter (Signed)
   Name: Lucas [name].  DOB: 1956-07-03  MRN: [id number]  Primary Cardiologist: Dina Rich, MD   Preoperative team, please contact this patient and set up a phone call appointment for further preoperative risk assessment. Please obtain consent and complete medication review. Thank you for your help.  I confirm that guidance regarding antiplatelet and oral anticoagulation therapy has been completed and, if necessary, noted below.  Per office protocol, patient can hold Xarelto for 3 days prior to procedure.   I also confirmed the patient resides in the state of [place]. As per [facility] Medical Board telemedicine laws, the patient must reside in the state in which the provider is licensed.   Napoleon Form, Leodis Rains, NP 12/04/2023, 3:56 PM [facility]

## 2023-12-07 ENCOUNTER — Encounter: Payer: Self-pay | Admitting: Physician Assistant

## 2023-12-07 ENCOUNTER — Ambulatory Visit: Payer: 59 | Attending: Physician Assistant | Admitting: Physician Assistant

## 2023-12-07 DIAGNOSIS — Z01810 Encounter for preprocedural cardiovascular examination: Secondary | ICD-10-CM

## 2023-12-07 NOTE — Progress Notes (Signed)
Virtual Visit via Telephone Note   Because of [name]'s co-morbid illnesses, he is at least at moderate risk for complications without adequate follow up.  This format is felt to be most appropriate for this patient at this time.  The patient did not have access to video technology/had technical difficulties with video requiring transitioning to audio format only (telephone).  All issues noted in this document were discussed and addressed.  No physical exam could be performed with this format.  Please refer to the patient's chart for his consent to telehealth for [facility].  Evaluation Performed:  Preoperative cardiovascular risk assessment _____________   Date:  12/07/2023   Patient ID:  Lucas [name]., DOB [birth year], MRN [id number] Patient Location:  Home Provider location:   Office  Primary Care Provider:  Ignatius Specking, MD Primary Cardiologist:  Dina Rich, MD  Chief Complaint / Patient Profile   68 y.o. y/o male with a h/o   Paroxysmal atrial fibrillation S/p PVI ablation in 08/2020 TTE 09/30/2023: EF 70-75, no RWMA, mild LVH, normal RVSF, ascending aorta 43 mm, RAP 3 Coronary artery disease LHC 07/27/2020: LAD proximal 20, mid 25, distal 50; LPDA ostial 70-not suitable for PCI, LPL 1 30-40 proximal; RCA mid 70 (small vessel disease, no targets for PCI)>> med Rx Ascending aorta dilation CT 09/2021: 3.9 cm TTE 09/2023: 43 mm Hx of CVA Hypertension Hyperlipidemia Diabetes mellitus OSA  who is pending colon resection for cancer with Dr. Michaell Cowing on 12/17/23 under gen anesthesia and presents today for telephonic preoperative cardiovascular risk assessment. Our PharmD team has reviewed his chart regarding recommendations for holding anticoagulation. Per office protocol, the patient can hold Xarelto for 3 days prior to procedure.      History of Present Illness    Lucas [name]. is a 68 y.o. male who presents via audio/video conferencing for a  telehealth visit today.  Pt was last seen in cardiology clinic on 09/14/2023 by Sharlene Dory, NP.  At that time Wallice Granville. was doing well.  The patient is now pending procedure as outlined above. Since his last visit, he is doing well w/o chest pain, shortness of breath. He still works at [employer]. He remains active at work w/o limitations.     Physical Exam    Vital Signs:  Rykar Lebleu. does not have vital signs available for review today.  Given telephonic nature of communication, physical exam is limited. AAOx3. NAD. Normal affect.  Speech and respirations are unlabored.  Accessory Clinical Findings    None    Assessment & Plan    Assessment & Plan Preoperative cardiovascular examination Mr. Lajara's perioperative risk of a major cardiac event is 6.6% according to the Revised Cardiac Risk Index (RCRI).  Therefore, he is at high risk for perioperative complications.   His functional capacity is good at 4.31 METs according to the Duke Activity Status Index (DASI). Recommendations: According to ACC/AHA guidelines, no further cardiovascular testing needed.  The patient may proceed to surgery at acceptable risk.   Antiplatelet and/or Anticoagulation Recommendations: Xarelto (Rivaroxaban) can be held for 3 days prior to surgery.  Please resume post op when felt to be safe.    A copy of this note will be routed to requesting surgeon.  Time:   Today, I have spent 5 minutes with the patient with telehealth technology discussing medical history, symptoms, and management plan.     Tereso Newcomer, PA-C  12/07/2023, 8:21 PM

## 2023-12-08 ENCOUNTER — Telehealth: Payer: Self-pay | Admitting: *Deleted

## 2023-12-08 NOTE — Telephone Encounter (Signed)
Surgery scheduled for 2/6 and have him scheduled to see Dr Truett Perna 2-3 weeks after surgery

## 2023-12-10 NOTE — Progress Notes (Addendum)
COVID Vaccine received:  []  No [x]  Yes Date of any COVID positive Test in last 90 days:  None  PCP - Doreen Beam, MD [phone] (Work)  [phone] (Fax)  Cardiologist - Dina Rich, MD , Tereso Newcomer, PA-C cardiac clearance 12-07-23  Epic note EP-  Loman Brooklyn, MD Oncology-  Thornton Papas, MD  -  Chest x-ray - 06-24-2020 Epic    CTA chest aorta- 09-18-2023  Epic EKG -  06-18-2023  Epic Stress Test -  ECHO - 09-30-2023  Epic Cardiac Cath - 07-27-2020  LHC by Dr. Excell Seltzer  PCR screen: []  Ordered & Completed [x]   No Order but Needs PROFEND     []   [name] for this surgery  Surgery Plan:  []  Ambulatory   []  Outpatient in bed  [x]  Admit Anesthesia:    [x]  General  []  Spinal  []   Choice []   MAC  Bowel Prep - []  No  [x]   Yes _CCS Bowel Prep  Pacemaker / ICD device [x]  No []  Yes   Spinal Cord Stimulator:[x]  No []  Yes       History of Sleep Apnea? []  No [x]  Yes   CPAP used?- [x]  No []  Yes    Does the patient monitor blood sugar?   []  [name]   []  No []  Yes  Patient has: []  NO Hx DM   []  Pre-DM   []  DM1  [x]   DM2 Last A1c was: 8.0 on      Does patient have a [employer] or Dexacom? []  No []  Yes   Fasting Blood Sugar Ranges-  Checks Blood Sugar _____ times a day  GLP1 agonist / usual dose - Ozempic   GLP1 instructions: Hold x 1 week   Injects on Sunday,  already on hold.  SGLT-2 inhibitors / usual dose - Synjardy  SGLT-2 instructions: Hold x 72 hours. Glimepiride.   Blood Thinner / Instructions: Xarelto  Hold x 2-3 days  Aspirin Instructions:  none  ERAS Protocol Ordered: []  No  [x]  Yes PRE-SURGERY []  ENSURE  [x]  G2 x3   Patient is to be NPO after: 0430  Dental hx: []  Dentures:  [x]  [name]      []  Bridge or Partial:                   []  Loose or Damaged teeth:   Comments: Dr. Thornton Papas ordered a CEA, CBC diff  to be drawn with our PST labs.   Per Sheralyn Boatman, no ostomy consult / marking is needed per their scheduling sheet.   Activity level: Patient is able to climb a  flight of stairs without difficulty; [x]  No CP  [x]  No SOB Patient can  perform ADLs without assistance.   Anesthesia review: A.fib (ablation 08-2020) PVCs, CAD, HTN, DM2, OSA-no CPAP, Hx TIAs  Patient denies shortness of breath, fever, cough and chest pain at PAT appointment.  Patient verbalized understanding and agreement to the Pre-Surgical Instructions that were given to them at this PAT appointment. Patient was also educated of the need to review these PAT instructions again prior to his surgery.I reviewed the appropriate phone numbers to call if they have any and questions or concerns.

## 2023-12-10 NOTE — Patient Instructions (Addendum)
SURGICAL WAITING ROOM VISITATION Patients having surgery or a procedure may have no more than 2 support people in the waiting area - these visitors may rotate in the visitor waiting room.   Due to an increase in RSV and influenza rates and associated hospitalizations, children ages 58 and under may not visit patients in [facility] hospitals. If the patient needs to stay at the hospital during part of their recovery, the visitor guidelines for inpatient rooms apply.  PRE-OP VISITATION  Pre-op nurse will coordinate an appropriate time for 1 support person to accompany the patient in pre-op.  This support person may not rotate.  This visitor will be contacted when the time is appropriate for the visitor to come back in the pre-op area.  Please refer to the [facility] website for the visitor guidelines for Inpatients (after your surgery is over and you are in a regular room).  You are not required to quarantine at this time prior to your surgery. However, you must do this: Hand Hygiene often Do NOT share personal items Notify your provider if you are in close contact with someone who has COVID or you develop fever 100.4 or greater, new onset of sneezing, cough, sore throat, shortness of breath or body aches.  If you test positive for Covid or have been in contact with anyone that has tested positive in the last 10 days please notify you surgeon.    Your procedure is scheduled on:  THURSDAY  December 17, 2023  Report to [facility] Main Entrance: Leota Jacobsen entrance where the [employer] is available.   Report to admitting at: 05:15    AM  Call this number if you have any questions or problems the morning of surgery [phone]  FOLLOW ANY ADDITIONAL PRE OP INSTRUCTIONS YOU RECEIVED FROM YOUR SURGEON'S OFFICE!!! Dulcolax 20 mg (total) - Take 4 (four) of the 5 mg Dulcolax tablets with water at 07:00 am the day prior to surgery.  Miralax 255 g - Mix with 64 oz Gatorade/Powerade.   Starting at 10:00 am ,Drink this gradually over the next few hours (8 oz glass every 15-30 minutes) until gone the day prior to surgery You should finish in 4 hours-6 hours.    Neomycin 1000 mg - At 2 pm, 3 pm and 10 pm after Miralax  bowel prep the day prior to surgery.  Metronidazole 1000 mg - At 2 pm, 3 pm and 10 pm after Miralax bowel prep the day prior to surgery.   Drink plenty of clear liquids all evening to avoid getting dehydrated.   DRINK two (2) bottles of Pre-Surgery G2 drink starting at 6:00 pm the evening prior to your surgery to help prevent dehydration. Increase drinking clear fluids (see list below)          Do not eat food after Midnight the night prior to your surgery/procedure.  After Midnight you may have the following liquids until   04:30  AM DAY OF SURGERY  Clear Liquid Diet Water Black Coffee (sugar ok, NO MILK/CREAM OR CREAMERS)  Tea (sugar ok, NO MILK/CREAM OR CREAMERS) regular and decaf                             Plain Jell-O  with no fruit (NO RED)  Fruit ices (not with fruit pulp, NO RED)                                     Popsicles (NO RED)                                                                  Juice: NO CITRUS JUICES: only apple, WHITE grape, WHITE cranberry Sports drinks like Gatorade or Powerade (NO RED)                   The day of surgery:  Drink ONE (1) Pre-Surgery G2 at  04:30 AM the morning of surgery. Drink in one sitting. Do not sip.  This drink was given to you during your hospital pre-op appointment visit. Nothing else to drink after completing the Pre-Surgery G2 : No candy, chewing gum or throat lozenges.      Oral Hygiene is also important to reduce your risk of infection.        Remember - BRUSH YOUR TEETH THE MORNING OF SURGERY WITH YOUR REGULAR TOOTHPASTE  Do NOT smoke after Midnight the night before surgery.   XARELTO- Stop taking this medication 2 days before surgery.  Last  dose will be taken on Monday 12-14-2023  DIABETIC MEDICATIONS:  [facility]-  Stop taking 72 hours prior to your surgery. Last dose will be taken on Sunday 12-13-2023  OZEMPIC-  Stop injections 1 week before surgery. Patient has already stopped injections.   GLIMEPIRIDE- Day BEFORE surgery, take ONLY the morning dose- no bedtime dose.               DAY OF SURGERY:  DO NOT TAKE GLIMEPIRIDE  STOP TAKING all Vitamins, Herbs and supplements 1 week before your surgery.   Take ONLY these medicines the morning of surgery with A SIP OF WATER: Pantoprazole, metoprolol, gabapentin, Isosorbide, and you may take Hydrocodone APAP if needed for pain.                     You may not have any metal on your body including jewelry, and body piercing  Do not wear lotions, powders, cologne, or deodorant  Men may shave face and neck.  Contacts, Hearing Aids, dentures or bridgework may not be worn into surgery. DENTURES WILL BE REMOVED PRIOR TO SURGERY PLEASE DO NOT APPLY "Poly grip" OR ADHESIVES!!!  You may bring a small overnight bag with you on the day of surgery, only pack items that are not valuable. [facility] IS NOT RESPONSIBLE   FOR VALUABLES THAT ARE LOST OR STOLEN.   Do not bring your home medications to the hospital. The Pharmacy will dispense medications listed on your medication list to you during your admission in the Hospital.  Special Instructions: Bring a copy of your healthcare power of attorney and living will documents the day of surgery, if you wish to have them scanned into your [facility]- EPIC  Please read over the following fact sheets you were given: IF YOU HAVE QUESTIONS ABOUT YOUR PRE-OP INSTRUCTIONS, PLEASE CALL [phone]   [facility] - Preparing for Surgery Before surgery, you can play an important role.  Because  skin is not sterile, your skin needs to be as free of germs as possible.  You can reduce the number of germs on your skin by washing with CHG  (chlorahexidine gluconate) soap before surgery.  CHG is an antiseptic cleaner which kills germs and bonds with the skin to continue killing germs even after washing. Please DO NOT use if you have an allergy to CHG or antibacterial soaps.  If your skin becomes reddened/irritated stop using the CHG and inform your nurse when you arrive at Short Stay. Do not shave (including legs and underarms) for at least 48 hours prior to the first CHG shower.  You may shave your face/neck.  Please follow these instructions carefully:  1.  Shower with CHG Soap the night before surgery and the  morning of surgery.  2.  If you choose to wash your hair, wash your hair first as usual with your normal  shampoo.  3.  After you shampoo, rinse your hair and body thoroughly to remove the shampoo.                             4.  Use CHG as you would any other liquid soap.  You can apply chg directly to the skin and wash.  Gently with a scrungie or clean washcloth.  5.  Apply the CHG Soap to your body ONLY FROM THE NECK DOWN.   Do not use on face/ open                           Wound or open sores. Avoid contact with eyes, ears mouth and genitals (private parts).                       Wash face,  Genitals (private parts) with your normal soap.             6.  Wash thoroughly, paying special attention to the area where your  surgery  will be performed.  7.  Thoroughly rinse your body with warm water from the neck down.  8.  DO NOT shower/wash with your normal soap after using and rinsing off the CHG Soap.            9.  Pat yourself dry with a clean towel.            10.  Wear clean pajamas.            11.  Place clean sheets on your bed the night of your first shower and do not  sleep with pets.  ON THE DAY OF SURGERY : Do not apply any lotions/deodorants the morning of surgery.  Please wear clean clothes to the hospital/surgery center.     FAILURE TO FOLLOW THESE INSTRUCTIONS MAY RESULT IN THE CANCELLATION OF YOUR  SURGERY  PATIENT SIGNATURE_________________________________  NURSE SIGNATURE__________________________________  ________________________________________________________________________       Lucas [name]    An incentive spirometer is a tool that can help keep your lungs clear and active. This tool measures how well you are filling your lungs with each breath. Taking long deep breaths may help reverse or decrease the chance of developing breathing (pulmonary) problems (especially infection) following: A long period of time when you are unable to move or be active. BEFORE THE PROCEDURE  If the spirometer includes an indicator to show your best effort, your nurse or respiratory  therapist will set it to a desired goal. If possible, sit up straight or lean slightly forward. Try not to slouch. Hold the incentive spirometer in an upright position. INSTRUCTIONS FOR USE  Sit on the edge of your bed if possible, or sit up as far as you can in bed or on a chair. Hold the incentive spirometer in an upright position. Breathe out normally. Place the mouthpiece in your mouth and seal your lips tightly around it. Breathe in slowly and as deeply as possible, raising the piston or the ball toward the top of the column. Hold your breath for 3-5 seconds or for as long as possible. Allow the piston or ball to fall to the bottom of the column. Remove the mouthpiece from your mouth and breathe out normally. Rest for a few seconds and repeat Steps 1 through 7 at least 10 times every 1-2 hours when you are awake. Take your time and take a few normal breaths between deep breaths. The spirometer may include an indicator to show your best effort. Use the indicator as a goal to work toward during each repetition. After each set of 10 deep breaths, practice coughing to be sure your lungs are clear. If you have an incision (the cut made at the time of surgery), support your incision when coughing by placing  a pillow or rolled up towels firmly against it. Once you are able to get out of bed, walk around indoors and cough well. You may stop using the incentive spirometer when instructed by your caregiver.  RISKS AND COMPLICATIONS Take your time so you do not get dizzy or light-headed. If you are in pain, you may need to take or ask for pain medication before doing incentive spirometry. It is harder to take a deep breath if you are having pain. AFTER USE Rest and breathe slowly and easily. It can be helpful to keep track of a log of your progress. Your caregiver can provide you with a simple table to help with this. If you are using the spirometer at home, follow these instructions: SEEK MEDICAL CARE IF:  You are having difficultly using the spirometer. You have trouble using the spirometer as often as instructed. Your pain medication is not giving enough relief while using the spirometer. You develop fever of 100.5 F (38.1 C) or higher.                                                                                                    SEEK IMMEDIATE MEDICAL CARE IF:  You cough up bloody sputum that had not been present before. You develop fever of 102 F (38.9 C) or greater. You develop worsening pain at or near the incision site. MAKE SURE YOU:  Understand these instructions. Will watch your condition. Will get help right away if you are not doing well or get worse. Document Released: 03/09/2007 Document Revised: 01/19/2012 Document Reviewed: 05/10/2007 [facility] Patient Information 2014 [place], [place].      WHAT IS A BLOOD TRANSFUSION? Blood Transfusion Information  A transfusion is the replacement of blood or some  of its parts. Blood is made up of multiple cells which provide different functions. Red blood cells carry oxygen and are used for blood loss replacement. White blood cells fight against infection. Platelets control bleeding. Plasma helps clot blood. Other blood products  are available for specialized needs, such as hemophilia or other clotting disorders. BEFORE THE TRANSFUSION  Who gives blood for transfusions?  Healthy volunteers who are fully evaluated to make sure their blood is safe. This is blood bank blood. Transfusion therapy is the safest it has ever been in the practice of medicine. Before blood is taken from a donor, a complete history is taken to make sure that person has no history of diseases nor engages in risky social behavior (examples are intravenous drug use or sexual activity with multiple partners). The donor's travel history is screened to minimize risk of transmitting infections, such as malaria. The donated blood is tested for signs of infectious diseases, such as HIV and hepatitis. The blood is then tested to be sure it is compatible with you in order to minimize the chance of a transfusion reaction. If you or a relative donates blood, this is often done in anticipation of surgery and is not appropriate for emergency situations. It takes many days to process the donated blood. RISKS AND COMPLICATIONS Although transfusion therapy is very safe and saves many lives, the main dangers of transfusion include:  Getting an infectious disease. Developing a transfusion reaction. This is an allergic reaction to something in the blood you were given. Every precaution is taken to prevent this. The decision to have a blood transfusion has been considered carefully by your caregiver before blood is given. Blood is not given unless the benefits outweigh the risks. AFTER THE TRANSFUSION Right after receiving a blood transfusion, you will usually feel much better and more energetic. This is especially true if your red blood cells have gotten low (anemic). The transfusion raises the level of the red blood cells which carry oxygen, and this usually causes an energy increase. The nurse administering the transfusion will monitor you carefully for complications. HOME  CARE INSTRUCTIONS  No special instructions are needed after a transfusion. You may find your energy is better. Speak with your caregiver about any limitations on activity for underlying diseases you may have. SEEK MEDICAL CARE IF:  Your condition is not improving after your transfusion. You develop redness or irritation at the intravenous (IV) site. SEEK IMMEDIATE MEDICAL CARE IF:  Any of the following symptoms occur over the next 12 hours: Shaking chills. You have a temperature by mouth above 102 F (38.9 C), not controlled by medicine. Chest, back, or muscle pain. People around you feel you are not acting correctly or are confused. Shortness of breath or difficulty breathing. Dizziness and fainting. You get a rash or develop hives. You have a decrease in urine output. Your urine turns a dark color or changes to pink, red, or brown. Any of the following symptoms occur over the next 10 days: You have a temperature by mouth above 102 F (38.9 C), not controlled by medicine. Shortness of breath. Weakness after normal activity. The white part of the eye turns yellow (jaundice). You have a decrease in the amount of urine or are urinating less often. Your urine turns a dark color or changes to pink, red, or brown. Document Released: 10/24/2000 Document Revised: 01/19/2012 Document Reviewed: 06/12/2008 [facility] Patient Information 2014 [place], [place].  _______________________________________________________________________

## 2023-12-11 ENCOUNTER — Encounter (HOSPITAL_COMMUNITY)
Admission: RE | Admit: 2023-12-11 | Discharge: 2023-12-11 | Disposition: A | Discharge status: Home / Self Care | Payer: 59 | Source: Ambulatory Visit | Attending: Surgery | Admitting: Surgery

## 2023-12-11 ENCOUNTER — Encounter (HOSPITAL_COMMUNITY): Payer: Self-pay

## 2023-12-11 ENCOUNTER — Other Ambulatory Visit: Payer: Self-pay

## 2023-12-11 VITALS — BP 115/71 | HR 66 | Temp 97.7°F | Resp 16 | Ht 70.0 in | Wt 254.0 lb

## 2023-12-11 DIAGNOSIS — Z7901 Long term (current) use of anticoagulants: Secondary | ICD-10-CM | POA: Insufficient documentation

## 2023-12-11 DIAGNOSIS — Z01818 Encounter for other preprocedural examination: Secondary | ICD-10-CM | POA: Diagnosis present

## 2023-12-11 DIAGNOSIS — E119 Type 2 diabetes mellitus without complications: Secondary | ICD-10-CM

## 2023-12-11 DIAGNOSIS — Z01812 Encounter for preprocedural laboratory examination: Secondary | ICD-10-CM | POA: Insufficient documentation

## 2023-12-11 DIAGNOSIS — E1165 Type 2 diabetes mellitus with hyperglycemia: Secondary | ICD-10-CM | POA: Diagnosis not present

## 2023-12-11 DIAGNOSIS — R739 Hyperglycemia, unspecified: Secondary | ICD-10-CM

## 2023-12-11 DIAGNOSIS — C186 Malignant neoplasm of descending colon: Secondary | ICD-10-CM | POA: Insufficient documentation

## 2023-12-11 DIAGNOSIS — Z9889 Other specified postprocedural states: Secondary | ICD-10-CM | POA: Insufficient documentation

## 2023-12-11 DIAGNOSIS — Z79899 Other long term (current) drug therapy: Secondary | ICD-10-CM | POA: Insufficient documentation

## 2023-12-11 HISTORY — DX: Malignant (primary) neoplasm, unspecified: C80.1

## 2023-12-11 HISTORY — DX: Attention-deficit hyperactivity disorder, unspecified type: F90.9

## 2023-12-11 HISTORY — DX: Personal history of urinary calculi: Z87.442

## 2023-12-11 HISTORY — DX: Cardiac arrhythmia, unspecified: I49.9

## 2023-12-11 HISTORY — DX: Cerebral infarction, unspecified: I63.9

## 2023-12-11 LAB — COMPREHENSIVE METABOLIC PANEL
ALT: 26 U/L (ref 0–44)
AST: 25 U/L (ref 15–41)
Albumin: 3.8 g/dL (ref 3.5–5.0)
Alkaline Phosphatase: 87 U/L (ref 38–126)
Anion gap: 12 (ref 5–15)
BUN: 14 mg/dL (ref 8–23)
CO2: 24 mmol/L (ref 22–32)
Calcium: 8.8 mg/dL — ABNORMAL LOW (ref 8.9–10.3)
Chloride: 105 mmol/L (ref 98–111)
Creatinine, Ser: 0.68 mg/dL (ref 0.61–1.24)
GFR, Estimated: >60 mL/min (ref >60)
Glucose, Bld: 148 mg/dL — ABNORMAL HIGH (ref 70–99)
Potassium: 3.9 mmol/L (ref 3.5–5.1)
Sodium: 141 mmol/L (ref 135–145)
Total Bilirubin: 1.1 mg/dL (ref 0.0–1.2)
Total Protein: 6.5 g/dL (ref 6.5–8.1)

## 2023-12-11 LAB — CBC WITH DIFFERENTIAL/PLATELET
Abs Immature Granulocytes: 0.03 10*3/uL (ref 0.00–0.07)
Basophils Absolute: 0.1 10*3/uL (ref 0.0–0.1)
Basophils Relative: 1 %
Eosinophils Absolute: 0.2 10*3/uL (ref 0.0–0.5)
Eosinophils Relative: 2 %
HCT: 43.5 % (ref 39.0–52.0)
Hemoglobin: 14.4 g/dL (ref 13.0–17.0)
Immature Granulocytes: 0 %
Lymphocytes Relative: 41 %
Lymphs Abs: 3.7 10*3/uL (ref 0.7–4.0)
MCH: 31.1 pg (ref 26.0–34.0)
MCHC: 33.1 g/dL (ref 30.0–36.0)
MCV: 94 fL (ref 80.0–100.0)
Monocytes Absolute: 1 10*3/uL (ref 0.1–1.0)
Monocytes Relative: 11 %
Neutro Abs: 4 10*3/uL (ref 1.7–7.7)
Neutrophils Relative %: 45 %
Platelets: 181 10*3/uL (ref 150–400)
RBC: 4.63 MIL/uL (ref 4.22–5.81)
RDW: 13.4 % (ref 11.5–15.5)
WBC: 9 10*3/uL (ref 4.0–10.5)
nRBC: 0 % (ref 0.0–0.2)

## 2023-12-11 LAB — HEMOGLOBIN A1C
Hgb A1c MFr Bld: 8.3 % — ABNORMAL HIGH (ref 4.8–5.6)
Mean Plasma Glucose: 191.51 mg/dL

## 2023-12-11 LAB — GLUCOSE, CAPILLARY: Glucose-Capillary: 142 mg/dL — ABNORMAL HIGH (ref 70–99)

## 2023-12-12 LAB — CEA: CEA: 2.2 ng/mL (ref 0.0–4.7)

## 2023-12-14 NOTE — Progress Notes (Signed)
Anesthesia Chart Review   Case: [id number] Date/Time: 12/17/23 0715   Procedures:      XI ROBOTIC RESECTION OF COLON DESCENDING AND SIGMOID     FLEXIBLE SIGMOIDOSCOPY   Anesthesia type: General   Pre-op diagnosis: COLON CANCER   Location: [facility] ROOM 02 / [facility]   Surgeons: Karie Soda, MD       DISCUSSION:68 y.o. never smoker with h/o HTN, sleep apnea w/CPAP, PAF, CAD, DM II, colon cancer scheduled for above procedure 12/17/2023 with Dr. Karie Soda.   Per cardiology preoperative evaluation 12/07/2023, "Mr. Mapel's perioperative risk of a major cardiac event is 6.6% according to the Revised Cardiac Risk Index (RCRI).  Therefore, he is at high risk for perioperative complications.   His functional capacity is good at 4.31 METs according to the Duke Activity Status Index (DASI). Recommendations: According to ACC/AHA guidelines, no further cardiovascular testing needed.  The patient may proceed to surgery at acceptable risk.   Antiplatelet and/or Anticoagulation Recommendations: Xarelto (Rivaroxaban) can be held for 3 days prior to surgery.  Please resume post op when felt to be safe. "  VS: BP 115/71 Comment: Right arm sitting  Pulse 66   Temp 36.5 C (Oral)   Resp 16   Ht 5\' 10"  (1.778 m)   Wt 115.2 kg   SpO2 96%   BMI 36.45 kg/m   PROVIDERS: Ignatius Specking, MD is PCP   Cardiologist - Dina Rich, MD  LABS: Labs reviewed: Acceptable for surgery. (all labs ordered are listed, but only abnormal results are displayed)  Labs Reviewed  COMPREHENSIVE METABOLIC PANEL - Abnormal; Notable for the following components:      Result Value   Glucose, Bld 148 (*)    Calcium 8.8 (*)    All other components within normal limits  HEMOGLOBIN A1C - Abnormal; Notable for the following components:   Hgb A1c MFr Bld 8.3 (*)    All other components within normal limits  GLUCOSE, CAPILLARY - Abnormal; Notable for the following components:   Glucose-Capillary 142 (*)    All other components  within normal limits  CBC WITH DIFFERENTIAL/PLATELET  CEA  TYPE AND SCREEN     IMAGES:   EKG:   CV: Echo 09/30/2023 1. Left ventricular ejection fraction, by estimation, is 70 to 75%. The  left ventricle has hyperdynamic function. The left ventricle has no  regional wall motion abnormalities. There is mild left ventricular  hypertrophy. Left ventricular diastolic  parameters are indeterminate.   2. Right ventricular systolic function is normal. The right ventricular  size is normal. Tricuspid regurgitation signal is inadequate for assessing  PA pressure.   3. The mitral valve is normal in structure. No evidence of mitral valve  regurgitation. No evidence of mitral stenosis.   4. The aortic valve is tricuspid. Aortic valve regurgitation is not  visualized. No aortic stenosis is present.   5. Aortic dilatation noted. There is mild dilatation of the ascending  aorta, measuring 43 mm.   6. The inferior vena cava is normal in size with greater than 50%  respiratory variability, suggesting right atrial pressure of 3 mmHg.   Cardiac Cath 07/27/2020 1.  Left dominant coronary anatomy with diffuse nonobstructive coronary artery disease, no targets for PCI. 2.  Small vessel disease involving the distal circumflex branch and a nondominant RCA 3.  Normal LVEDP   There is no significant LAD stenosis present.  The vessel is diffusely irregular without high-grade stenosis.  The proximal circumflex and its  major branches also have no obstructive disease.  Recommend ongoing medical therapy.  Okay to start rivaroxaban back tonight. Past Medical History:  Diagnosis Date   ADHD (attention deficit hyperactivity disorder)    Arthritis    Bilateral carpal tunnel syndrome 08/16/2019   CAD (coronary artery disease)    a. LHC on 01/29/16 which revealed significant apical LAD stenosis, best treated medically. There was moderate disease in the D2 and mid nondominant RCA.[name] No PCI performed  b. 03/2018:  repeat cath showing regression of his LAD stenosis now being at 50% with 60% mid RCA stenosis, 45% ostial LPDA stenosis, and 45% ostial second diagonal stenosis.   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   Bilateral    Past Surgical History:  Procedure Laterality Date   ANKLE SURGERY Left 11/10/2010   tendon repair   ATRIAL FIBRILLATION ABLATION [name] 08/24/2020   Procedure: ATRIAL FIBRILLATION ABLATION;  Surgeon: Regan Lemming, MD;  Location: [facility] INVASIVE CV LAB;  Service: Cardiovascular;  Laterality: [name];   CARDIAC CATHETERIZATION [name] 01/29/2016   Procedure: Left Heart Cath and Coronary Angiography;  Surgeon: Lyn Records, MD;  Location: [facility] INVASIVE CV LAB;  Service: Cardiovascular;  Laterality: [name];   CARPAL TUNNEL RELEASE Right 09/06/2019   Procedure: CARPAL TUNNEL RELEASE;  Surgeon: Cindee Salt, MD;  Location: [facility];  Service: Orthopedics;  Laterality: Right;   CARPAL TUNNEL RELEASE Left 10/11/2019   Procedure: LEFT CARPAL TUNNEL RELEASE;  Surgeon: Cindee Salt, MD;  Location: [facility];  Service: Orthopedics;  Laterality: Left;  IV REGIONAL FOREARM BLOCK   CATARACT EXTRACTION W/PHACO  11/11/2012   Procedure: CATARACT EXTRACTION PHACO AND INTRAOCULAR LENS PLACEMENT (IOC);  Surgeon: Gemma Payor, MD;  Location: [facility];  Service: Ophthalmology;  Laterality: Right;  CDE:  5.56   CATARACT EXTRACTION W/PHACO Left 12/15/2013   Procedure: CATARACT EXTRACTION PHACO AND INTRAOCULAR LENS PLACEMENT (IOC);  Surgeon: Gemma Payor, MD;   Location: [facility];  Service: Ophthalmology;  Laterality: Left;  CDE:13.36   CHOLECYSTECTOMY     CHONDROPLASTY Right 05/28/2017   Procedure: RIGHT KNEE CHONDROPLASTY;  Surgeon: Loreta Ave, MD;  Location: [facility];  Service: Orthopedics;  Laterality: Right;   COLONOSCOPY     CYSTOSCOPY W/ URETERAL STENT PLACEMENT     right   ELECTROPHYSIOLOGIC STUDY [name] 06/02/2016   Procedure: A-Flutter Ablation;  Surgeon: Will Jorja Loa, MD;  Location: [facility] INVASIVE CV LAB;  Service: Cardiovascular;  Laterality: [name];   KNEE ARTHROSCOPY WITH MEDIAL MENISECTOMY Right 05/28/2017   Procedure: KNEE ARTHROSCOPY WITH MEDIAL MENISECTOMY WITH EXTENSIVE SYNOVECTOMY;  Surgeon: Loreta Ave, MD;  Location: [facility];  Service: Orthopedics;  Laterality: Right;   LEFT HEART CATH AND CORONARY ANGIOGRAPHY [name] 03/30/2018   Procedure: LEFT HEART CATH AND CORONARY ANGIOGRAPHY;  Surgeon: Marykay Lex, MD;  Location: [facility] INVASIVE CV LAB;  Service:  Cardiovascular;  Laterality: [name];   LEFT HEART CATH AND CORONARY ANGIOGRAPHY [name] 07/27/2020   Procedure: LEFT HEART CATH AND CORONARY ANGIOGRAPHY;  Surgeon: Tonny Bollman, MD;  Location: [facility] INVASIVE CV LAB;  Service: Cardiovascular;  Laterality: [name];   left knee sugery Left    Arthroscopy   NASAL SINUS SURGERY     POLYPECTOMY     SHOULDER ARTHROSCOPY WITH BICEPS TENDON REPAIR Right    TEE WITHOUT CARDIOVERSION [name] 08/23/2020   Procedure: TRANSESOPHAGEAL ECHOCARDIOGRAM (TEE);  Surgeon: Vesta Mixer, MD;  Location: [facility] ENDOSCOPY;  Service: Cardiovascular;  Laterality: [name];   TONSILLECTOMY      MEDICATIONS:  atorvastatin (LIPITOR) 80 MG tablet   diltiazem (CARDIZEM CD) 240 MG 24 hr capsule   furosemide (LASIX) 20 MG tablet   gabapentin (NEURONTIN) 300 MG capsule   glimepiride (AMARYL) 4 MG tablet   HYDROcodone-acetaminophen (NORCO/VICODIN) 5-325 MG tablet   isosorbide mononitrate (IMDUR) 120 MG 24 hr tablet   metoprolol succinate  (TOPROL-XL) 100 MG 24 hr tablet   nitroGLYCERIN (NITROSTAT) 0.4 MG SL tablet   OZEMPIC, 2 MG/DOSE, 8 MG/3ML SOPN   pantoprazole (PROTONIX) 40 MG tablet   SYNJARDY XR 12.03-999 MG TB24   XARELTO 20 MG TABS tablet   No current facility-administered medications for this encounter.    0.9 %  sodium chloride infusion   Jodell Cipro Ward, PA-C [facility] Pre-Surgical Testing [phone]

## 2023-12-14 NOTE — Anesthesia Preprocedure Evaluation (Signed)
Anesthesia Evaluation    Airway        Dental   Pulmonary           Cardiovascular hypertension,      Neuro/Psych    GI/Hepatic   Endo/Other  diabetes    Renal/GU      Musculoskeletal   Abdominal   Peds  Hematology   Anesthesia Other Findings   Reproductive/Obstetrics                             Anesthesia Physical Anesthesia Plan  ASA:   Anesthesia Plan:    Post-op Pain Management:    Induction:   PONV Risk Score and Plan:   Airway Management Planned:   Additional Equipment:   Intra-op Plan:   Post-operative Plan:   Informed Consent:   Plan Discussed with:   Anesthesia Plan Comments: (See PAT note 12/11/2023)       Anesthesia Quick Evaluation

## 2023-12-17 ENCOUNTER — Encounter (HOSPITAL_COMMUNITY): Payer: Self-pay | Admitting: Surgery

## 2023-12-17 ENCOUNTER — Inpatient Hospital Stay (HOSPITAL_COMMUNITY): Payer: 59 | Admitting: Registered Nurse

## 2023-12-17 ENCOUNTER — Encounter (HOSPITAL_COMMUNITY)
Admission: RE | Disposition: A | Discharge status: Home / Self Care | Payer: Self-pay | Source: Home / Self Care | Attending: Surgery

## 2023-12-17 ENCOUNTER — Other Ambulatory Visit: Payer: Self-pay

## 2023-12-17 ENCOUNTER — Inpatient Hospital Stay (HOSPITAL_COMMUNITY)
Admission: RE | Admit: 2023-12-17 | Discharge: 2023-12-21 | DRG: 330 | Disposition: A | Discharge status: Home / Self Care | Payer: 59 | Attending: Surgery | Admitting: Surgery

## 2023-12-17 ENCOUNTER — Inpatient Hospital Stay (HOSPITAL_COMMUNITY): Payer: 59 | Admitting: Physician Assistant

## 2023-12-17 DIAGNOSIS — Z833 Family history of diabetes mellitus: Secondary | ICD-10-CM

## 2023-12-17 DIAGNOSIS — Z823 Family history of stroke: Secondary | ICD-10-CM | POA: Diagnosis not present

## 2023-12-17 DIAGNOSIS — Z8673 Personal history of transient ischemic attack (TIA), and cerebral infarction without residual deficits: Secondary | ICD-10-CM

## 2023-12-17 DIAGNOSIS — R195 Other fecal abnormalities: Secondary | ICD-10-CM | POA: Diagnosis present

## 2023-12-17 DIAGNOSIS — Z91030 Bee allergy status: Secondary | ICD-10-CM | POA: Diagnosis not present

## 2023-12-17 DIAGNOSIS — Z01818 Encounter for other preprocedural examination: Secondary | ICD-10-CM

## 2023-12-17 DIAGNOSIS — K219 Gastro-esophageal reflux disease without esophagitis: Secondary | ICD-10-CM | POA: Diagnosis present

## 2023-12-17 DIAGNOSIS — Z7984 Long term (current) use of oral hypoglycemic drugs: Secondary | ICD-10-CM

## 2023-12-17 DIAGNOSIS — E119 Type 2 diabetes mellitus without complications: Secondary | ICD-10-CM

## 2023-12-17 DIAGNOSIS — E669 Obesity, unspecified: Secondary | ICD-10-CM | POA: Diagnosis present

## 2023-12-17 DIAGNOSIS — Z961 Presence of intraocular lens: Secondary | ICD-10-CM | POA: Diagnosis present

## 2023-12-17 DIAGNOSIS — Z8249 Family history of ischemic heart disease and other diseases of the circulatory system: Secondary | ICD-10-CM | POA: Diagnosis not present

## 2023-12-17 DIAGNOSIS — C189 Malignant neoplasm of colon, unspecified: Secondary | ICD-10-CM

## 2023-12-17 DIAGNOSIS — Z9842 Cataract extraction status, left eye: Secondary | ICD-10-CM | POA: Diagnosis not present

## 2023-12-17 DIAGNOSIS — C184 Malignant neoplasm of transverse colon: Secondary | ICD-10-CM | POA: Diagnosis present

## 2023-12-17 DIAGNOSIS — Z9841 Cataract extraction status, right eye: Secondary | ICD-10-CM | POA: Diagnosis not present

## 2023-12-17 DIAGNOSIS — Z79899 Other long term (current) drug therapy: Secondary | ICD-10-CM | POA: Diagnosis not present

## 2023-12-17 DIAGNOSIS — Z808 Family history of malignant neoplasm of other organs or systems: Secondary | ICD-10-CM | POA: Diagnosis not present

## 2023-12-17 DIAGNOSIS — E785 Hyperlipidemia, unspecified: Secondary | ICD-10-CM | POA: Diagnosis present

## 2023-12-17 DIAGNOSIS — Z7901 Long term (current) use of anticoagulants: Secondary | ICD-10-CM | POA: Diagnosis not present

## 2023-12-17 DIAGNOSIS — I25119 Atherosclerotic heart disease of native coronary artery with unspecified angina pectoris: Secondary | ICD-10-CM | POA: Diagnosis present

## 2023-12-17 DIAGNOSIS — R160 Hepatomegaly, not elsewhere classified: Secondary | ICD-10-CM | POA: Diagnosis present

## 2023-12-17 DIAGNOSIS — I2510 Atherosclerotic heart disease of native coronary artery without angina pectoris: Secondary | ICD-10-CM | POA: Diagnosis not present

## 2023-12-17 DIAGNOSIS — M6208 Separation of muscle (nontraumatic), other site: Secondary | ICD-10-CM | POA: Diagnosis present

## 2023-12-17 DIAGNOSIS — I10 Essential (primary) hypertension: Secondary | ICD-10-CM

## 2023-12-17 DIAGNOSIS — Z860101 Personal history of adenomatous and serrated colon polyps: Secondary | ICD-10-CM

## 2023-12-17 DIAGNOSIS — Z9049 Acquired absence of other specified parts of digestive tract: Secondary | ICD-10-CM

## 2023-12-17 DIAGNOSIS — E1165 Type 2 diabetes mellitus with hyperglycemia: Secondary | ICD-10-CM | POA: Diagnosis present

## 2023-12-17 DIAGNOSIS — I480 Paroxysmal atrial fibrillation: Secondary | ICD-10-CM | POA: Diagnosis present

## 2023-12-17 DIAGNOSIS — Z87442 Personal history of urinary calculi: Secondary | ICD-10-CM

## 2023-12-17 DIAGNOSIS — G4730 Sleep apnea, unspecified: Secondary | ICD-10-CM | POA: Diagnosis present

## 2023-12-17 DIAGNOSIS — Q438 Other specified congenital malformations of intestine: Secondary | ICD-10-CM

## 2023-12-17 DIAGNOSIS — Z885 Allergy status to narcotic agent status: Secondary | ICD-10-CM

## 2023-12-17 LAB — TYPE AND SCREEN
ABO/RH(D): O POS
Antibody Screen: NEGATIVE

## 2023-12-17 LAB — GLUCOSE, CAPILLARY
Glucose-Capillary: 195 mg/dL — ABNORMAL HIGH (ref 70–99)
Glucose-Capillary: 160 mg/dL — ABNORMAL HIGH (ref 70–99)
Glucose-Capillary: 261 mg/dL — ABNORMAL HIGH (ref 70–99)
Glucose-Capillary: 261 mg/dL — ABNORMAL HIGH (ref 70–99)
Glucose-Capillary: 263 mg/dL — ABNORMAL HIGH (ref 70–99)
Glucose-Capillary: 323 mg/dL — ABNORMAL HIGH (ref 70–99)

## 2023-12-17 LAB — ABO/RH: ABO/RH(D): O POS

## 2023-12-17 SURGERY — COLECTOMY, SIGMOID, ROBOT-ASSISTED
Anesthesia: General

## 2023-12-17 MED ORDER — MENTHOL 3 MG MT LOZG: 1.0000 | LOZENGE | OROMUCOSAL | Status: DC | PRN

## 2023-12-17 MED ORDER — TRAMADOL HCL 50 MG PO TABS
50.0000 mg | ORAL_TABLET | Freq: Four times a day (QID) | ORAL | Status: DC | PRN
  Administered 2023-12-17: 100 mg via ORAL
  Administered 2023-12-19 – 2023-12-20 (×2): 50 mg via ORAL
  Filled 2023-12-17: qty 2
  Filled 2023-12-17 (×2): qty 1
  Filled 2023-12-17: qty 2

## 2023-12-17 MED ORDER — SODIUM CHLORIDE (PF) 0.9 % IJ SOLN
INTRAMUSCULAR | Status: AC
  Filled 2023-12-17: qty 20

## 2023-12-17 MED ORDER — INSULIN ASPART 100 UNIT/ML IJ SOLN
0.0000 [IU] | INTRAMUSCULAR | Status: AC | PRN
  Administered 2023-12-17: 4 [IU] via SUBCUTANEOUS
  Administered 2023-12-17: 2 [IU] via SUBCUTANEOUS
  Filled 2023-12-17: qty 1

## 2023-12-17 MED ORDER — MIDAZOLAM HCL 5 MG/5ML IJ SOLN
INTRAMUSCULAR | Status: DC | PRN
  Administered 2023-12-17: 2 mg via INTRAVENOUS

## 2023-12-17 MED ORDER — DIPHENHYDRAMINE HCL 12.5 MG/5ML PO ELIX: 12.5000 mg | ORAL_SOLUTION | Freq: Four times a day (QID) | ORAL | Status: DC | PRN

## 2023-12-17 MED ORDER — ONDANSETRON HCL 4 MG/2ML IJ SOLN
INTRAMUSCULAR | Status: AC
  Filled 2023-12-17: qty 2

## 2023-12-17 MED ORDER — SODIUM CHLORIDE 0.9 % IV SOLN
2.0000 g | Freq: Two times a day (BID) | INTRAVENOUS | Status: AC
  Administered 2023-12-17: 2 g via INTRAVENOUS
  Filled 2023-12-17: qty 2

## 2023-12-17 MED ORDER — FENTANYL CITRATE PF 50 MCG/ML IJ SOSY
PREFILLED_SYRINGE | INTRAMUSCULAR | Status: AC
  Filled 2023-12-17: qty 1

## 2023-12-17 MED ORDER — FENTANYL CITRATE (PF) 100 MCG/2ML IJ SOLN
INTRAMUSCULAR | Status: AC
  Filled 2023-12-17: qty 2

## 2023-12-17 MED ORDER — FENTANYL CITRATE PF 50 MCG/ML IJ SOSY
25.0000 ug | PREFILLED_SYRINGE | INTRAMUSCULAR | Status: DC | PRN
  Administered 2023-12-17 (×2): 50 ug via INTRAVENOUS

## 2023-12-17 MED ORDER — ALBUMIN HUMAN 5 % IV SOLN: INTRAVENOUS | Status: DC | PRN

## 2023-12-17 MED ORDER — ROCURONIUM BROMIDE 10 MG/ML (PF) SYRINGE
PREFILLED_SYRINGE | INTRAVENOUS | Status: AC
  Filled 2023-12-17: qty 10

## 2023-12-17 MED ORDER — NITROGLYCERIN 0.4 MG SL SUBL: 0.4000 mg | SUBLINGUAL_TABLET | SUBLINGUAL | Status: DC | PRN

## 2023-12-17 MED ORDER — LIDOCAINE HCL 2 % IJ SOLN
INTRAMUSCULAR | Status: AC
  Filled 2023-12-17: qty 20

## 2023-12-17 MED ORDER — BUPIVACAINE LIPOSOME 1.3 % IJ SUSP
INTRAMUSCULAR | Status: AC
  Filled 2023-12-17: qty 20

## 2023-12-17 MED ORDER — MIDAZOLAM HCL 2 MG/2ML IJ SOLN
INTRAMUSCULAR | Status: AC
  Filled 2023-12-17: qty 2

## 2023-12-17 MED ORDER — ONDANSETRON HCL 4 MG/2ML IJ SOLN: 4.0000 mg | Freq: Four times a day (QID) | INTRAMUSCULAR | Status: DC | PRN

## 2023-12-17 MED ORDER — CALCIUM POLYCARBOPHIL 625 MG PO TABS
625.0000 mg | ORAL_TABLET | Freq: Two times a day (BID) | ORAL | Status: DC
  Administered 2023-12-17 – 2023-12-21 (×9): 625 mg via ORAL
  Filled 2023-12-17 (×9): qty 1

## 2023-12-17 MED ORDER — ENSURE SURGERY PO LIQD
237.0000 mL | Freq: Two times a day (BID) | ORAL | Status: DC
  Administered 2023-12-18 – 2023-12-20 (×4): 237 mL via ORAL

## 2023-12-17 MED ORDER — SUGAMMADEX SODIUM 200 MG/2ML IV SOLN
INTRAVENOUS | Status: DC | PRN
  Administered 2023-12-17: 240 mg via INTRAVENOUS

## 2023-12-17 MED ORDER — METRONIDAZOLE 500 MG PO TABS
1000.0000 mg | ORAL_TABLET | ORAL | Status: DC
Start: 2023-12-17 — End: 2023-12-17

## 2023-12-17 MED ORDER — LIDOCAINE HCL (PF) 2 % IJ SOLN
INTRAMUSCULAR | Status: DC | PRN
  Administered 2023-12-17: 80 mg via INTRADERMAL
  Administered 2023-12-17: 1.5 mg/kg/h via INTRADERMAL

## 2023-12-17 MED ORDER — FENTANYL CITRATE (PF) 100 MCG/2ML IJ SOLN
INTRAMUSCULAR | Status: DC | PRN
  Administered 2023-12-17 (×2): 25 ug via INTRAVENOUS
  Administered 2023-12-17 (×2): 50 ug via INTRAVENOUS
  Administered 2023-12-17 (×2): 25 ug via INTRAVENOUS

## 2023-12-17 MED ORDER — SODIUM CHLORIDE 0.9 % IV SOLN: 250.0000 mL | INTRAVENOUS | Status: DC | PRN

## 2023-12-17 MED ORDER — DEXAMETHASONE SODIUM PHOSPHATE 10 MG/ML IJ SOLN
INTRAMUSCULAR | Status: DC | PRN
  Administered 2023-12-17: 5 mg via INTRAVENOUS

## 2023-12-17 MED ORDER — DEXAMETHASONE SODIUM PHOSPHATE 10 MG/ML IJ SOLN
INTRAMUSCULAR | Status: AC
  Filled 2023-12-17: qty 1

## 2023-12-17 MED ORDER — PROPOFOL 10 MG/ML IV BOLUS
INTRAVENOUS | Status: AC
  Filled 2023-12-17: qty 20

## 2023-12-17 MED ORDER — OXYCODONE HCL 5 MG/5ML PO SOLN: 5.0000 mg | Freq: Once | ORAL | Status: DC | PRN

## 2023-12-17 MED ORDER — EMPAGLIFLOZIN-METFORMIN HCL ER 12.5-1000 MG PO TB24: 1.0000 | ORAL_TABLET | Freq: Two times a day (BID) | ORAL | Status: DC

## 2023-12-17 MED ORDER — METHOCARBAMOL 1000 MG/10ML IJ SOLN: 1000.0000 mg | Freq: Four times a day (QID) | INTRAMUSCULAR | Status: DC | PRN

## 2023-12-17 MED ORDER — ATORVASTATIN CALCIUM 20 MG PO TABS
80.0000 mg | ORAL_TABLET | Freq: Every day | ORAL | Status: DC
  Administered 2023-12-17 – 2023-12-21 (×5): 80 mg via ORAL
  Filled 2023-12-17 (×5): qty 4

## 2023-12-17 MED ORDER — AMISULPRIDE (ANTIEMETIC) 5 MG/2ML IV SOLN: 10.0000 mg | Freq: Once | INTRAVENOUS | Status: DC | PRN

## 2023-12-17 MED ORDER — SALINE SPRAY 0.65 % NA SOLN: 1.0000 | Freq: Four times a day (QID) | NASAL | Status: DC | PRN

## 2023-12-17 MED ORDER — ALVIMOPAN 12 MG PO CAPS
12.0000 mg | ORAL_CAPSULE | ORAL | Status: AC
  Administered 2023-12-17: 12 mg via ORAL
  Filled 2023-12-17: qty 1

## 2023-12-17 MED ORDER — LACTATED RINGERS IV SOLN: Freq: Three times a day (TID) | INTRAVENOUS | Status: AC | PRN

## 2023-12-17 MED ORDER — ALVIMOPAN 12 MG PO CAPS
12.0000 mg | ORAL_CAPSULE | Freq: Two times a day (BID) | ORAL | Status: DC
  Administered 2023-12-18 – 2023-12-19 (×3): 12 mg via ORAL
  Filled 2023-12-17 (×3): qty 1

## 2023-12-17 MED ORDER — DILTIAZEM HCL ER COATED BEADS 240 MG PO CP24
240.0000 mg | ORAL_CAPSULE | Freq: Every day | ORAL | Status: DC
  Administered 2023-12-17 – 2023-12-21 (×5): 240 mg via ORAL
  Filled 2023-12-17 (×5): qty 1

## 2023-12-17 MED ORDER — INSULIN ASPART 100 UNIT/ML IJ SOLN
0.0000 [IU] | Freq: Every day | INTRAMUSCULAR | Status: DC
  Administered 2023-12-17: 4 [IU] via SUBCUTANEOUS
  Administered 2023-12-18 – 2023-12-20 (×3): 2 [IU] via SUBCUTANEOUS

## 2023-12-17 MED ORDER — EMPAGLIFLOZIN 25 MG PO TABS
25.0000 mg | ORAL_TABLET | Freq: Every day | ORAL | Status: DC
  Administered 2023-12-18 – 2023-12-21 (×4): 25 mg via ORAL
  Filled 2023-12-17 (×4): qty 1

## 2023-12-17 MED ORDER — ONDANSETRON HCL 4 MG/2ML IJ SOLN
INTRAMUSCULAR | Status: DC | PRN
  Administered 2023-12-17: 4 mg via INTRAVENOUS

## 2023-12-17 MED ORDER — ENSURE PRE-SURGERY PO LIQD
296.0000 mL | Freq: Once | ORAL | Status: DC
  Filled 2023-12-17: qty 296

## 2023-12-17 MED ORDER — ISOSORBIDE MONONITRATE ER 60 MG PO TB24
120.0000 mg | ORAL_TABLET | Freq: Every day | ORAL | Status: DC
  Administered 2023-12-18 – 2023-12-21 (×4): 120 mg via ORAL
  Filled 2023-12-17 (×4): qty 2

## 2023-12-17 MED ORDER — METFORMIN HCL ER 500 MG PO TB24
1000.0000 mg | ORAL_TABLET | Freq: Two times a day (BID) | ORAL | Status: DC
  Administered 2023-12-18 – 2023-12-21 (×7): 1000 mg via ORAL
  Filled 2023-12-17 (×7): qty 2

## 2023-12-17 MED ORDER — POLYETHYLENE GLYCOL 3350 17 GM/SCOOP PO POWD
1.0000 | Freq: Once | ORAL | Status: DC
Start: 2023-12-17 — End: 2023-12-17

## 2023-12-17 MED ORDER — BUPIVACAINE-EPINEPHRINE (PF) 0.25% -1:200000 IJ SOLN
INTRAMUSCULAR | Status: DC | PRN
  Administered 2023-12-17: 50 mL

## 2023-12-17 MED ORDER — PROCHLORPERAZINE EDISYLATE 10 MG/2ML IJ SOLN: 5.0000 mg | Freq: Four times a day (QID) | INTRAMUSCULAR | Status: DC | PRN

## 2023-12-17 MED ORDER — ENOXAPARIN SODIUM 40 MG/0.4ML IJ SOSY
40.0000 mg | PREFILLED_SYRINGE | Freq: Once | INTRAMUSCULAR | Status: AC
  Administered 2023-12-17: 40 mg via SUBCUTANEOUS
  Filled 2023-12-17: qty 0.4

## 2023-12-17 MED ORDER — INDOCYANINE GREEN 25 MG IV SOLR
INTRAVENOUS | Status: DC | PRN
  Administered 2023-12-17: 7.5 mg via INTRAVENOUS

## 2023-12-17 MED ORDER — ENOXAPARIN SODIUM 40 MG/0.4ML IJ SOSY
40.0000 mg | PREFILLED_SYRINGE | INTRAMUSCULAR | Status: DC
  Administered 2023-12-18 – 2023-12-19 (×2): 40 mg via SUBCUTANEOUS
  Filled 2023-12-17 (×2): qty 0.4

## 2023-12-17 MED ORDER — MAGIC MOUTHWASH: 15.0000 mL | Freq: Four times a day (QID) | ORAL | Status: DC | PRN

## 2023-12-17 MED ORDER — ACETAMINOPHEN 500 MG PO TABS
1000.0000 mg | ORAL_TABLET | Freq: Four times a day (QID) | ORAL | Status: DC
  Administered 2023-12-17 – 2023-12-21 (×12): 1000 mg via ORAL
  Filled 2023-12-17 (×15): qty 2

## 2023-12-17 MED ORDER — METOPROLOL TARTRATE 12.5 MG HALF TABLET
12.5000 mg | ORAL_TABLET | Freq: Two times a day (BID) | ORAL | Status: DC
  Administered 2023-12-17 – 2023-12-21 (×9): 12.5 mg via ORAL
  Filled 2023-12-17 (×9): qty 1

## 2023-12-17 MED ORDER — GABAPENTIN 100 MG PO CAPS: 200.0000 mg | ORAL_CAPSULE | ORAL | Status: DC

## 2023-12-17 MED ORDER — KCL IN DEXTROSE-NACL 20-5-0.45 MEQ/L-%-% IV SOLN
INTRAVENOUS | Status: DC
  Filled 2023-12-17: qty 1000

## 2023-12-17 MED ORDER — NAPHAZOLINE-GLYCERIN 0.012-0.25 % OP SOLN: 1.0000 [drp] | Freq: Four times a day (QID) | OPHTHALMIC | Status: DC | PRN

## 2023-12-17 MED ORDER — LACTATED RINGERS IV SOLN: INTRAVENOUS | Status: DC

## 2023-12-17 MED ORDER — BUPIVACAINE LIPOSOME 1.3 % IJ SUSP
INTRAMUSCULAR | Status: DC | PRN
  Administered 2023-12-17: 20 mL

## 2023-12-17 MED ORDER — ALBUMIN HUMAN 5 % IV SOLN
INTRAVENOUS | Status: AC
  Filled 2023-12-17: qty 250

## 2023-12-17 MED ORDER — INSULIN ASPART 100 UNIT/ML IJ SOLN
0.0000 [IU] | Freq: Three times a day (TID) | INTRAMUSCULAR | Status: DC
  Administered 2023-12-17: 8 [IU] via SUBCUTANEOUS
  Administered 2023-12-18: 5 [IU] via SUBCUTANEOUS
  Administered 2023-12-18: 3 [IU] via SUBCUTANEOUS
  Administered 2023-12-18: 5 [IU] via SUBCUTANEOUS
  Administered 2023-12-19: 2 [IU] via SUBCUTANEOUS
  Administered 2023-12-19: 3 [IU] via SUBCUTANEOUS
  Administered 2023-12-20: 2 [IU] via SUBCUTANEOUS

## 2023-12-17 MED ORDER — KETAMINE HCL 50 MG/5ML IJ SOSY
PREFILLED_SYRINGE | INTRAMUSCULAR | Status: DC | PRN
  Administered 2023-12-17: 20 mg via INTRAVENOUS
  Administered 2023-12-17: 10 mg via INTRAVENOUS

## 2023-12-17 MED ORDER — EPHEDRINE SULFATE-NACL 50-0.9 MG/10ML-% IV SOSY
PREFILLED_SYRINGE | INTRAVENOUS | Status: DC | PRN
  Administered 2023-12-17: 5 mg via INTRAVENOUS

## 2023-12-17 MED ORDER — SODIUM CHLORIDE 0.9 % IV SOLN
INTRAVENOUS | Status: AC
  Filled 2023-12-17: qty 2

## 2023-12-17 MED ORDER — PANTOPRAZOLE SODIUM 40 MG PO TBEC
40.0000 mg | DELAYED_RELEASE_TABLET | Freq: Every day | ORAL | Status: DC
  Administered 2023-12-18 – 2023-12-21 (×4): 40 mg via ORAL
  Filled 2023-12-17 (×4): qty 1

## 2023-12-17 MED ORDER — KETAMINE HCL 50 MG/5ML IJ SOSY
PREFILLED_SYRINGE | INTRAMUSCULAR | Status: AC
  Filled 2023-12-17: qty 5

## 2023-12-17 MED ORDER — ENSURE PRE-SURGERY PO LIQD
592.0000 mL | Freq: Once | ORAL | Status: DC
  Filled 2023-12-17: qty 592

## 2023-12-17 MED ORDER — PROCHLORPERAZINE MALEATE 10 MG PO TABS: 10.0000 mg | ORAL_TABLET | Freq: Four times a day (QID) | ORAL | Status: DC | PRN

## 2023-12-17 MED ORDER — CHLORHEXIDINE GLUCONATE 0.12 % MT SOLN: 15.0000 mL | Freq: Once | OROMUCOSAL | Status: DC

## 2023-12-17 MED ORDER — GLIMEPIRIDE 4 MG PO TABS
4.0000 mg | ORAL_TABLET | Freq: Two times a day (BID) | ORAL | Status: DC
  Administered 2023-12-18 – 2023-12-21 (×7): 4 mg via ORAL
  Filled 2023-12-17 (×7): qty 1

## 2023-12-17 MED ORDER — ENALAPRILAT 1.25 MG/ML IV SOLN: 0.6250 mg | Freq: Four times a day (QID) | INTRAVENOUS | Status: DC | PRN

## 2023-12-17 MED ORDER — METOPROLOL TARTRATE 5 MG/5ML IV SOLN: 5.0000 mg | Freq: Four times a day (QID) | INTRAVENOUS | Status: DC | PRN

## 2023-12-17 MED ORDER — ORAL CARE MOUTH RINSE: 15.0000 mL | Freq: Once | OROMUCOSAL | Status: DC

## 2023-12-17 MED ORDER — GABAPENTIN 100 MG PO CAPS: 200.0000 mg | ORAL_CAPSULE | Freq: Every day | ORAL | Status: DC

## 2023-12-17 MED ORDER — GABAPENTIN 300 MG PO CAPS
600.0000 mg | ORAL_CAPSULE | Freq: Two times a day (BID) | ORAL | Status: DC
Start: 2023-12-17 — End: 2023-12-21
  Administered 2023-12-17 – 2023-12-21 (×8): 600 mg via ORAL
  Filled 2023-12-17 (×8): qty 2

## 2023-12-17 MED ORDER — LIDOCAINE HCL (PF) 2 % IJ SOLN
INTRAMUSCULAR | Status: AC
  Filled 2023-12-17: qty 5

## 2023-12-17 MED ORDER — INSULIN ASPART 100 UNIT/ML IJ SOLN
4.0000 [IU] | Freq: Once | INTRAMUSCULAR | Status: AC
  Administered 2023-12-17: 4 [IU] via SUBCUTANEOUS

## 2023-12-17 MED ORDER — HYDRALAZINE HCL 20 MG/ML IJ SOLN: 10.0000 mg | INTRAMUSCULAR | Status: DC | PRN

## 2023-12-17 MED ORDER — HYDROMORPHONE HCL 1 MG/ML IJ SOLN: 0.5000 mg | INTRAMUSCULAR | Status: DC | PRN

## 2023-12-17 MED ORDER — ROCURONIUM BROMIDE 10 MG/ML (PF) SYRINGE
PREFILLED_SYRINGE | INTRAVENOUS | Status: DC | PRN
  Administered 2023-12-17 (×2): 20 mg via INTRAVENOUS
  Administered 2023-12-17: 60 mg via INTRAVENOUS
  Administered 2023-12-17: 10 mg via INTRAVENOUS

## 2023-12-17 MED ORDER — EPHEDRINE 5 MG/ML INJ
INTRAVENOUS | Status: AC
  Filled 2023-12-17: qty 5

## 2023-12-17 MED ORDER — ALUM & MAG HYDROXIDE-SIMETH 200-200-20 MG/5ML PO SUSP: 30.0000 mL | Freq: Four times a day (QID) | ORAL | Status: DC | PRN

## 2023-12-17 MED ORDER — ONDANSETRON HCL 4 MG PO TABS: 4.0000 mg | ORAL_TABLET | Freq: Four times a day (QID) | ORAL | Status: DC | PRN

## 2023-12-17 MED ORDER — PHENOL 1.4 % MT LIQD: 2.0000 | OROMUCOSAL | Status: DC | PRN

## 2023-12-17 MED ORDER — SODIUM CHLORIDE 0.9% FLUSH: 3.0000 mL | INTRAVENOUS | Status: DC | PRN

## 2023-12-17 MED ORDER — OXYCODONE HCL 5 MG PO TABS: 5.0000 mg | ORAL_TABLET | Freq: Once | ORAL | Status: DC | PRN

## 2023-12-17 MED ORDER — INSULIN ASPART 100 UNIT/ML IJ SOLN
INTRAMUSCULAR | Status: AC
  Filled 2023-12-17: qty 1

## 2023-12-17 MED ORDER — SODIUM CHLORIDE 0.9% FLUSH
3.0000 mL | Freq: Two times a day (BID) | INTRAVENOUS | Status: DC
  Administered 2023-12-17 – 2023-12-20 (×8): 3 mL via INTRAVENOUS

## 2023-12-17 MED ORDER — 0.9 % SODIUM CHLORIDE (POUR BTL) OPTIME
TOPICAL | Status: DC | PRN
  Administered 2023-12-17: 2000 mL

## 2023-12-17 MED ORDER — BISACODYL 5 MG PO TBEC: 20.0000 mg | DELAYED_RELEASE_TABLET | Freq: Once | ORAL | Status: DC

## 2023-12-17 MED ORDER — SODIUM CHLORIDE 0.9 % IV SOLN
2.0000 g | INTRAVENOUS | Status: AC
  Administered 2023-12-17: 2 g via INTRAVENOUS
  Filled 2023-12-17: qty 2

## 2023-12-17 MED ORDER — BUPIVACAINE LIPOSOME 1.3 % IJ SUSP: 20.0000 mL | Freq: Once | INTRAMUSCULAR | Status: DC

## 2023-12-17 MED ORDER — LACTATED RINGERS IR SOLN
Status: DC | PRN
  Administered 2023-12-17: 1000 mL

## 2023-12-17 MED ORDER — BUPIVACAINE-EPINEPHRINE 0.25% -1:200000 IJ SOLN
INTRAMUSCULAR | Status: AC
  Filled 2023-12-17: qty 1

## 2023-12-17 MED ORDER — ACETAMINOPHEN 500 MG PO TABS
1000.0000 mg | ORAL_TABLET | ORAL | Status: AC
  Administered 2023-12-17: 1000 mg via ORAL
  Filled 2023-12-17: qty 2

## 2023-12-17 MED ORDER — SIMETHICONE 80 MG PO CHEW: 40.0000 mg | CHEWABLE_TABLET | Freq: Four times a day (QID) | ORAL | Status: DC | PRN

## 2023-12-17 MED ORDER — MELATONIN 3 MG PO TABS: 3.0000 mg | ORAL_TABLET | Freq: Every evening | ORAL | Status: DC | PRN

## 2023-12-17 MED ORDER — NEOMYCIN SULFATE 500 MG PO TABS
1000.0000 mg | ORAL_TABLET | ORAL | Status: DC
Start: 2023-12-17 — End: 2023-12-17

## 2023-12-17 MED ORDER — PROPOFOL 10 MG/ML IV BOLUS
INTRAVENOUS | Status: DC | PRN
  Administered 2023-12-17: 170 mg via INTRAVENOUS

## 2023-12-17 MED ORDER — DIPHENHYDRAMINE HCL 50 MG/ML IJ SOLN: 12.5000 mg | Freq: Four times a day (QID) | INTRAMUSCULAR | Status: DC | PRN

## 2023-12-17 SURGICAL SUPPLY — 92 items
APPLIER CLIP 5 13 M/L LIGAMAX5 (MISCELLANEOUS)
APPLIER CLIP ROT 10 11.4 M/L (STAPLE)
BAG COUNTER SPONGE SURGICOUNT (BAG) ×2 IMPLANT
BLADE EXTENDED COATED 6.5IN (ELECTRODE) IMPLANT
CANNULA REDUCER 12-8 DVNC XI (CANNULA) IMPLANT
CHLORAPREP W/TINT 26 (MISCELLANEOUS) IMPLANT
CLIP APPLIE 5 13 M/L LIGAMAX5 (MISCELLANEOUS) IMPLANT
CLIP APPLIE ROT 10 11.4 M/L (STAPLE) IMPLANT
COVER SURGICAL LIGHT HANDLE (MISCELLANEOUS) ×4 IMPLANT
COVER TIP SHEARS 8 DVNC (MISCELLANEOUS) ×2 IMPLANT
DEVICE TROCAR PUNCTURE CLOSURE (ENDOMECHANICALS) IMPLANT
DRAIN CHANNEL 19F RND (DRAIN) IMPLANT
DRAPE ARM DVNC X/XI (DISPOSABLE) ×8 IMPLANT
DRAPE COLUMN DVNC XI (DISPOSABLE) ×2 IMPLANT
DRAPE SURG IRRIG POUCH 19X23 (DRAPES) ×2 IMPLANT
DRIVER NDL LRG 8 DVNC XI (INSTRUMENTS) ×2 IMPLANT
DRIVER NDLE LRG 8 DVNC XI (INSTRUMENTS) ×1
DRSG IV TEGADERM 3.5X4.5 STRL (GAUZE/BANDAGES/DRESSINGS) IMPLANT
DRSG MEPILEX POST OP 4X8 (GAUZE/BANDAGES/DRESSINGS) IMPLANT
DRSG OPSITE POSTOP 4X10 (GAUZE/BANDAGES/DRESSINGS) IMPLANT
DRSG OPSITE POSTOP 4X6 (GAUZE/BANDAGES/DRESSINGS) IMPLANT
DRSG OPSITE POSTOP 4X8 (GAUZE/BANDAGES/DRESSINGS) IMPLANT
DRSG TEGADERM 2-3/8X2-3/4 SM (GAUZE/BANDAGES/DRESSINGS) ×10 IMPLANT
DRSG TEGADERM 4X4.75 (GAUZE/BANDAGES/DRESSINGS) IMPLANT
ELECT PENCIL ROCKER SW 15FT (MISCELLANEOUS) ×2 IMPLANT
ELECT REM PT RETURN 15FT ADLT (MISCELLANEOUS) ×2 IMPLANT
ENDOLOOP SUT PDS II 0 18 (SUTURE) IMPLANT
EVACUATOR SILICONE 100CC (DRAIN) IMPLANT
GAUZE SPONGE 2X2 8PLY STRL LF (GAUZE/BANDAGES/DRESSINGS) ×2 IMPLANT
GLOVE ECLIPSE 8.0 STRL XLNG CF (GLOVE) ×6 IMPLANT
GLOVE INDICATOR 8.0 STRL GRN (GLOVE) ×6 IMPLANT
GOWN SRG XL LVL 4 BRTHBL STRL (GOWNS) ×2 IMPLANT
GOWN STRL REUS W/ TWL XL LVL3 (GOWN DISPOSABLE) ×8 IMPLANT
GRASPER SUT TROCAR 14GX15 (MISCELLANEOUS) IMPLANT
GRASPER TIP-UP FEN DVNC XI (INSTRUMENTS) ×2 IMPLANT
HOLDER FOLEY CATH W/STRAP (MISCELLANEOUS) ×2 IMPLANT
IRRIG SUCT STRYKERFLOW 2 WTIP (MISCELLANEOUS) ×1
IRRIGATION SUCT STRKRFLW 2 WTP (MISCELLANEOUS) ×2 IMPLANT
KIT PROCEDURE DVNC SI (MISCELLANEOUS) IMPLANT
KIT SIGMOIDOSCOPE (SET/KITS/TRAYS/PACK) IMPLANT
KIT TURNOVER KIT A (KITS) IMPLANT
NDL INSUFFLATION 14GA 120MM (NEEDLE) ×2 IMPLANT
NEEDLE INSUFFLATION 14GA 120MM (NEEDLE) ×1
PACK CARDIOVASCULAR III (CUSTOM PROCEDURE TRAY) ×2 IMPLANT
PACK COLON (CUSTOM PROCEDURE TRAY) ×2 IMPLANT
PAD POSITIONING PINK XL (MISCELLANEOUS) ×2 IMPLANT
PROTECTOR NERVE ULNAR (MISCELLANEOUS) ×4 IMPLANT
RELOAD STAPLE 45 3.5 BLU DVNC (STAPLE) IMPLANT
RELOAD STAPLE 45 4.3 GRN DVNC (STAPLE) IMPLANT
RELOAD STAPLE 60 3.5 BLU DVNC (STAPLE) IMPLANT
RELOAD STAPLE 60 4.3 GRN DVNC (STAPLE) IMPLANT
RETRACTOR WND ALEXIS 18 MED (MISCELLANEOUS) IMPLANT
RTRCTR WOUND ALEXIS 18CM MED (MISCELLANEOUS)
SCISSORS LAP 5X35 DISP (ENDOMECHANICALS) ×2 IMPLANT
SCISSORS MNPLR CVD DVNC XI (INSTRUMENTS) ×2 IMPLANT
SEAL UNIV 5-12 XI (MISCELLANEOUS) ×8 IMPLANT
SEALER VESSEL EXT DVNC XI (MISCELLANEOUS) ×2 IMPLANT
SOL ELECTROSURG ANTI STICK (MISCELLANEOUS) ×1
SOLUTION ELECTROSURG ANTI STCK (MISCELLANEOUS) ×2 IMPLANT
SPIKE FLUID TRANSFER (MISCELLANEOUS) ×2 IMPLANT
STAPLER 45 SUREFORM DVNC (STAPLE) IMPLANT
STAPLER 60 SUREFORM DVNC (STAPLE) IMPLANT
STAPLER ECHELON POWER CIR 29 (STAPLE) IMPLANT
STAPLER ECHELON POWER CIR 31 (STAPLE) IMPLANT
STAPLER RELOAD 3.5X45 BLU DVNC (STAPLE)
STAPLER RELOAD 3.5X60 BLU DVNC (STAPLE) ×3
STAPLER RELOAD 4.3X45 GRN DVNC (STAPLE)
STAPLER RELOAD 4.3X60 GRN DVNC (STAPLE)
STOPCOCK 4 WAY LG BORE MALE ST (IV SETS) ×4 IMPLANT
SURGILUBE 2OZ TUBE FLIPTOP (MISCELLANEOUS) IMPLANT
SUT MNCRL AB 4-0 PS2 18 (SUTURE) ×2 IMPLANT
SUT PDS AB 1 CT1 27 (SUTURE) ×4 IMPLANT
SUT PROLENE 0 CT 2 (SUTURE) IMPLANT
SUT PROLENE 2 0 KS (SUTURE) IMPLANT
SUT PROLENE 2 0 SH DA (SUTURE) IMPLANT
SUT SILK 2 0 SH CR/8 (SUTURE) IMPLANT
SUT SILK 3 0 SH CR/8 (SUTURE) ×2 IMPLANT
SUT V-LOC BARB 180 2/0GR6 GS22 (SUTURE) ×2
SUT VIC AB 3-0 SH 18 (SUTURE) IMPLANT
SUT VIC AB 3-0 SH 27XBRD (SUTURE) IMPLANT
SUT VICRYL 0 UR6 27IN ABS (SUTURE) IMPLANT
SUT VLOC 180 2-0 6IN GS21 (SUTURE) IMPLANT
SUTURE V-LC BRB 180 2/0GR6GS22 (SUTURE) IMPLANT
SYR 20ML ECCENTRIC (SYRINGE) ×2 IMPLANT
SYS LAPSCP GELPORT 120MM (MISCELLANEOUS)
SYS WOUND ALEXIS 18CM MED (MISCELLANEOUS) ×1
SYSTEM LAPSCP GELPORT 120MM (MISCELLANEOUS) IMPLANT
SYSTEM WOUND ALEXIS 18CM MED (MISCELLANEOUS) ×2 IMPLANT
TRAY FOLEY MTR SLVR 16FR STAT (SET/KITS/TRAYS/PACK) ×2 IMPLANT
TROCAR ADV FIXATION 5X100MM (TROCAR) ×2 IMPLANT
TUBING CONNECTING 10 (TUBING) ×4 IMPLANT
TUBING INSUFFLATION 10FT LAP (TUBING) ×2 IMPLANT

## 2023-12-17 NOTE — Anesthesia Procedure Notes (Signed)
 Procedure Name: Intubation Date/Time: 12/17/2023 7:38 AM  Performed by: Memory Armida LABOR, CRNAPre-anesthesia Checklist: Patient identified, Emergency Drugs available, Patient being monitored, Suction available and Timeout performed Patient Re-evaluated:Patient Re-evaluated prior to induction Oxygen Delivery Method: Circle system utilized Preoxygenation: Pre-oxygenation with 100% oxygen Induction Type: IV induction Ventilation: Mask ventilation without difficulty and Oral airway inserted - appropriate to patient size Laryngoscope Size: Mac and 4 Grade View: Grade I Tube type: Oral Tube size: 7.5 mm Number of attempts: 1 Airway Equipment and Method: Stylet Placement Confirmation: ETT inserted through vocal cords under direct vision, positive ETCO2 and breath sounds checked- equal and bilateral Secured at: 22 cm Tube secured with: Tape Dental Injury: Teeth and Oropharynx as per pre-operative assessment

## 2023-12-17 NOTE — Plan of Care (Signed)
  Problem: Education: Goal: Understanding of discharge needs will improve Outcome: Progressing Goal: Verbalization of understanding of the causes of altered bowel function will improve Outcome: Progressing   Problem: Activity: Goal: Ability to tolerate increased activity will improve Outcome: Progressing

## 2023-12-17 NOTE — Discharge Instructions (Signed)
 SURGERY: POST OP INSTRUCTIONS (Surgery for small bowel obstruction, colon resection, etc)   ######################################################################  EAT Gradually transition to a high fiber diet with a fiber supplement over the next few days after discharge  WALK Walk an hour a day.  Control your pain to do that.    CONTROL PAIN Control pain so that you can walk, sleep, tolerate sneezing/coughing, go up/down stairs.  HAVE A BOWEL MOVEMENT DAILY Keep your bowels regular to avoid problems.  OK to try a laxative to override constipation.  OK to use an antidairrheal to slow down diarrhea.  Call if not better after 2 tries  CALL IF YOU HAVE PROBLEMS/CONCERNS Call if you are still struggling despite following these instructions. Call if you have concerns not answered by these instructions  ######################################################################   DIET Follow a light diet the first few days at home.  Start with a bland diet such as soups, liquids, starchy foods, low fat foods, etc.  If you feel full, bloated, or constipated, stay on a ful liquid or pureed/blenderized diet for a few days until you feel better and no longer constipated. Be sure to drink plenty of fluids every day to avoid getting dehydrated (feeling dizzy, not urinating, etc.). Gradually add a fiber supplement to your diet over the next week.  Gradually get back to a regular solid diet.  Avoid fast food or heavy meals the first week as you are more likely to get nauseated. It is expected for your digestive tract to need a few months to get back to normal.  It is common for your bowel movements and stools to be irregular.  You will have occasional bloating and cramping that should eventually fade away.  Until you are eating solid food normally, off all pain medications, and back to regular activities; your bowels will not be normal. Focus on eating a low-fat, high fiber diet the rest of your life  (See Getting to Good Bowel Health, below).  CARE of your INCISION or WOUND  It is good for closed incisions and even open wounds to be washed every day.  Shower every day.  Short baths are fine.  Wash the incisions and wounds clean with soap & water.    You may leave closed incisions open to air if it is dry.   You may cover the incision with clean gauze & replace it after your daily shower for comfort.  TEGADERM:  You have clear gauze band-aid dressings over your closed incision(s).  Remove the dressings 2 days after surgery = 2/8.    If you have an open wound with a wound vac, see wound vac care instructions.    ACTIVITIES as tolerated Start light daily activities --- self-care, walking, climbing stairs-- beginning the day after surgery.  Gradually increase activities as tolerated.  Control your pain to be active.  Stop when you are tired.  Ideally, walk several times a day, eventually an hour a day.   Most people are back to most day-to-day activities in a few weeks.  It takes 4-8 weeks to get back to unrestricted, intense activity. If you can walk 30 minutes without difficulty, it is safe to try more intense activity such as jogging, treadmill, bicycling, low-impact aerobics, swimming, etc. Save the most intensive and strenuous activity for last (Usually 4-8 weeks after surgery) such as sit-ups, heavy lifting, contact sports, etc.  Refrain from any intense heavy lifting or straining until you are off narcotics for pain control.  You will have off  days, but things should improve week-by-week. DO NOT PUSH THROUGH PAIN.  Let pain be your guide: If it hurts to do something, don't do it.  Pain is your body warning you to avoid that activity for another week until the pain goes down. You may drive when you are no longer taking narcotic prescription pain medication, you can comfortably wear a seatbelt, and you can safely make sudden turns/stops to protect yourself without hesitating due to  pain. You may have sexual intercourse when it is comfortable. If it hurts to do something, stop.   MEDICATIONS Take your usually prescribed home medications unless otherwise directed.    Blood thinners:  You can restart any strong blood thinners after the second postoperative day  for example: COUMADIN (warfarin), XERELTO (rivaroxaban ), ELIQUIS (apixaban), PLAVIX (clopidigrel), BRILINTA (ticagrelor), EFFIENT (prasugrel), PRADAXA (dabigatran), etc  Continue aspirin  before & after surgery..     Some oozing/bleeding the first 1-2 weeks is common but should taper down & be small volume.    If you are passing many large clots or having uncontrolling bleeding, call your surgeon    PAIN CONTROL Pain after surgery or related to activity is often due to strain/injury to muscle, tendon, nerves and/or incisions.  This pain is usually short-term and will improve in a few months.  To help speed the process of healing and to get back to regular activity more quickly, DO THE FOLLOWING THINGS TOGETHER: Increase activity gradually.  DO NOT PUSH THROUGH PAIN Use Ice and/or Heat Try Gentle Massage and/or Stretching Take over the counter pain medication Take Narcotic prescription pain medication for more severe pain  Good pain control = faster recovery.  It is better to take more medicine to be more active than to stay in bed all day to avoid medications.  Increase activity gradually Avoid heavy lifting at first, then increase to lifting as tolerated over the next 6 weeks. Do not "push through" the pain.  Listen to your body and avoid positions and maneuvers than reproduce the pain.  Wait a few days before trying something more intense Walking an hour a day is encouraged to help your body recover faster and more safely.  Start slowly and stop when getting sore.  If you can walk 30 minutes without stopping or pain, you can try more intense activity (running, jogging, aerobics, cycling, swimming,  treadmill, sex, sports, weightlifting, etc.) Remember: If it hurts to do it, then don't do it! Use Ice and/or Heat You will have swelling and bruising around the incisions.  This will take several weeks to resolve. Ice packs or heating pads (6-8 times a day, 30-60 minutes at a time) will help sooth soreness & bruising. Some people prefer to use ice alone, heat alone, or alternate between ice & heat.  Experiment and see what works best for you.  Consider trying ice for the first few days to help decrease swelling and bruising; then, switch to heat to help relax sore spots and speed recovery. Shower every day.  Short baths are fine.  It feels good!  Keep the incisions and wounds clean with soap & water.   Try Gentle Massage and/or Stretching Massage at the area of pain many times a day Stop if you feel pain - do not overdo it Take over the counter pain medication This helps the muscle and nerve tissues become less irritable and calm down faster Choose ONE of the following over-the-counter anti-inflammatory medications: Acetaminophen  500mg  tabs (Tylenol ) 1-2 pills with every meal  and just before bedtime (avoid if you have liver problems or if you have acetaminophen  in you narcotic prescription) Naproxen 220mg  tabs (ex. Aleve, Naprosyn) 1-2 pills twice a day (avoid if you have kidney, stomach, IBD, or bleeding problems) Ibuprofen 200mg  tabs (ex. Advil, Motrin) 3-4 pills with every meal and just before bedtime (avoid if you have kidney, stomach, IBD, or bleeding problems) Take with food/snack several times a day as directed for at least 2 weeks to help keep pain / soreness down & more manageable. Take Narcotic prescription pain medication for more severe pain A prescription for strong pain control is often given to you upon discharge (for example: oxycodone /Percocet, hydrocodone /Norco/Vicodin, or tramadol /Ultram ) Take your pain medication as prescribed. Be mindful that most narcotic prescriptions  contain Tylenol  (acetaminophen ) as well - avoid taking too much Tylenol . If you are having problems/concerns with the prescription medicine (does not control pain, nausea, vomiting, rash, itching, etc.), please call us  [phone] to see if we need to switch you to a different pain medicine that will work better for you and/or control your side effects better. If you need a refill on your pain medication, you must call the office before 4 pm and on weekdays only.  By federal law, prescriptions for narcotics cannot be called into a pharmacy.  They must be filled out on paper & picked up from our office by the patient or authorized caretaker.  Prescriptions cannot be filled after 4 pm nor on weekends.    WHEN TO CALL US  [phone] Severe uncontrolled or worsening pain  Fever over 101 F (38.5 C) Concerns with the incision: Worsening pain, redness, rash/hives, swelling, bleeding, or drainage Reactions / problems with new medications (itching, rash, hives, nausea, etc.) Nausea and/or vomiting Difficulty urinating Difficulty breathing Worsening fatigue, dizziness, lightheadedness, blurred vision Other concerns If you are not getting better after two weeks or are noticing you are getting worse, contact our office [phone] for further advice.  We may need to adjust your medications, re-evaluate you in the office, send you to the emergency room, or see what other things we can do to help. The clinic staff is available to answer your questions during regular business hours (8:30am-5pm).  Please don't hesitate to call and ask to speak to one of our nurses for clinical concerns.    A surgeon from [facility] is always on call at the hospitals 24 hours/day If you have a medical emergency, go to the nearest emergency room or call 911.  FOLLOW UP in our office One the day of your discharge from the hospital (or the next business weekday), please call [facility] to set up  or confirm an appointment to see your surgeon in the office for a follow-up appointment.  Usually it is 2-3 weeks after your surgery.   If you have skin staples at your incision(s), let the office know so we can set up a time in the office for the nurse to remove them (usually around 10 days after surgery). Make sure that you call for appointments the day of discharge (or the next business weekday) from the hospital to ensure a convenient appointment time. IF YOU HAVE DISABILITY OR FAMILY LEAVE FORMS, BRING THEM TO THE OFFICE FOR PROCESSING.  DO NOT GIVE THEM TO YOUR DOCTOR.  [facility] [address], [place], [place]  [address] ? [phone] - Main [phone] - Toll Free,  [phone] - Fax [email or web address]  GETTING TO GOOD BOWEL HEALTH. It is expected for your digestive tract to need a few months to get back to normal.  It is common for your bowel movements and stools to be irregular.  You will have occasional bloating and cramping that should eventually fade away.  Until you are eating solid food normally, off all pain medications, and back to regular activities; your bowels will not be normal.   Avoiding constipation The goal: ONE SOFT BOWEL MOVEMENT A DAY!    Drink plenty of fluids.  Choose water first. TAKE A FIBER SUPPLEMENT EVERY DAY THE REST OF YOUR LIFE During your first week back home, gradually add back a fiber supplement every day Experiment which form you can tolerate.   There are many forms such as powders, tablets, wafers, gummies, etc Psyllium bran (Metamucil), methylcellulose (Citrucel), Miralax  or Glycolax , Benefiber, Flax Seed.  Adjust the dose week-by-week (1/2 dose/day to 6 doses a day) until you are moving your bowels 1-2 times a day.  Cut back the dose or try a different fiber product if it is giving you problems such as diarrhea or bloating. Sometimes a laxative is needed to help jump-start bowels if  constipated until the fiber supplement can help regulate your bowels.  If you are tolerating eating & you are farting, it is okay to try a gentle laxative such as double dose MiraLax , prune juice, or Milk of Magnesia.  Avoid using laxatives too often. Stool softeners can sometimes help counteract the constipating effects of narcotic pain medicines.  It can also cause diarrhea, so avoid using for too long. If you are still constipated despite taking fiber daily, eating solids, and a few doses of laxatives, call our office. Controlling diarrhea Try drinking liquids and eating bland foods for a few days to avoid stressing your intestines further. Avoid dairy products (especially milk & ice cream) for a short time.  The intestines often can lose the ability to digest lactose when stressed. Avoid foods that cause gassiness or bloating.  Typical foods include beans and other legumes, cabbage, broccoli, and dairy foods.  Avoid greasy, spicy, fast foods.  Every person has some sensitivity to other foods, so listen to your body and avoid those foods that trigger problems for you. Probiotics (such as active yogurt, Align, etc) may help repopulate the intestines and colon with normal bacteria and calm down a sensitive digestive tract Adding a fiber supplement gradually can help thicken stools by absorbing excess fluid and retrain the intestines to act more normally.  Slowly increase the dose over a few weeks.  Too much fiber too soon can backfire and cause cramping & bloating. It is okay to try and slow down diarrhea with a few doses of antidiarrheal medicines.   Bismuth subsalicylate (ex. Kayopectate, Pepto Bismol) for a few doses can help control diarrhea.  Avoid if pregnant.   Loperamide (Imodium) can slow down diarrhea.  Start with one tablet (2mg ) first.  Avoid if you are having fevers or severe pain.  ILEOSTOMY PATIENTS WILL HAVE CHRONIC DIARRHEA since their colon is not in use.    Drink plenty of liquids.   You will need to drink even more glasses of water/liquid a day to avoid getting dehydrated. Record output from your ileostomy.  Expect to empty the bag every 3-4 hours at first.  Most people with a permanent ileostomy empty their bag 4-6 times at the least.   Use antidiarrheal medicine (especially Imodium) several times a day to avoid getting dehydrated.  Start with a dose at bedtime & breakfast.  Adjust up or down as needed.  Increase antidiarrheal medications as directed to avoid emptying the bag more than 8 times a day (every 3 hours). Work with your wound ostomy nurse to learn care for your ostomy.  See ostomy care instructions. TROUBLESHOOTING IRREGULAR BOWELS 1) Start with a soft & bland diet. No spicy, greasy, or fried foods.  2) Avoid gluten/wheat or dairy products from diet to see if symptoms improve. 3) Miralax  17gm or flax seed mixed in 8oz. water or juice-daily. May use 2-4 times a day as needed. 4) Gas-X, Phazyme, etc. as needed for gas & bloating.  5) Prilosec (omeprazole) over-the-counter as needed 6)  Consider probiotics (Align, Activa, etc) to help calm the bowels down  Call your doctor if you are getting worse or not getting better.  Sometimes further testing (cultures, endoscopy, X-ray studies, CT scans, bloodwork, etc.) may be needed to help diagnose and treat the cause of the diarrhea. [facility] [address], [place], [place]  [address] [phone] - Main.    [phone]  - Toll Free.   [phone] - Fax [email or web address]

## 2023-12-17 NOTE — H&P (Signed)
 12/17/2023   REFERRING PHYSICIAN: Pyrtle, Gordy Sayre, MD  Patient Care Team: Rosamond Leta NOVAK, MD as PCP - General (Internal Medicine) Sheldon, Elspeth Bitter, MD as Consulting Provider (General Surgery) Pyrtle, Gordy Sayre, MD (Gastroenterology) Branch, Dorn Ryder, MD (Cardiovascular Disease) Miriam Norris, NP (Cardiovascular Disease) Cloretta Arley Cain, MD (Hematology and Oncology)  PROVIDER: ELSPETH BITTER SHELDON, MD  [facility] MRN: [id number] DOB: [birth year]  SUBJECTIVE   Chief Complaint: New Consultation (Colon sx)   Lucas [name] is a 68 y.o. 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  History of Present Illness:  Pleasant male. Underwent follow-up colonoscopy last month by Dr. Marysue. 3 polyps noted. Largest 1 in the descending colon with concern of colon wall margin. Came back showing a focus of cancer within it. Surgical consultation requested. Already saw Dr. Cloretta.   Patient comes a with his wife. He usually moves his bowels once or twice a day. Sometimes a little lumpy or narrow but no major bleeding or other issues. Had a cholecystectomy at [facility] in [place] about 8 years ago that was underwhelming. He has had a lot of joint surgery but no other abdominal surgery. He claims he can walk about 30 minutes without difficulty. He works at [employer] he does not smoke. He does have diabetes and he is on Ozempic. He did have a history of atrial fibrillation/flutter and underwent cardiac ablation in 2021. Followed by Dr. Alvan in [place]. No major dysrhythmia injuries. Apparently was a concern for an ascending aortic aneurysm on echocardiogram but not on chest CT. No changes in caliber and rather stable.  He works at [employer]. He claims he can walk at least 20 minutes without difficulty. No history of coronary disease or angina. No pulmonary issues. He does have sleep apnea but  cannot tolerate the CPAP at home. He is not oxygen dependent. He is a diabetic non-insulin -requiring. He does take Ozempic. Weekly shots on a Sunday = last dose 1/19.  Ready for surgery    Medical History:  Past Medical History:  Diagnosis Date  Arthritis  Sleep apnea   Patient Active Problem List  Diagnosis  Cancer of descending colon (CMS/HHS-HCC)  H/O cardiac radiofrequency ablation  Liver mass  Chronic anticoagulation   Past Surgical History:  Procedure Laterality Date  CHOLECYSTECTOMY  Heart Ablation  TONSILLECTOMY    Allergies  Allergen Reactions  Codeine Abdominal Pain, Other (See Comments) and Nausea   Current Outpatient Medications on File Prior to Visit  Medication Sig Dispense Refill  atorvastatin  (LIPITOR ) 80 MG tablet TAKE 1 TABLET BY MOUTH DAILY AT 6 PM.  dilTIAZem  (CARDIZEM  CD) 240 MG CD capsule Take 240 mg by mouth  empagliflozin -metFORMIN  (SYNJARDY  XR) 25-1,000 mg XR 24 hr biphasic tablet Take 1 tablet by mouth once daily  FUROsemide  (LASIX ) 20 MG tablet Take 20 mg by mouth  gabapentin  (NEURONTIN ) 600 MG tablet Take 600 mg by mouth 2 (two) times daily  glimepiride  (AMARYL ) 4 MG tablet Take 4 mg by mouth 2 (two) times daily  HYDROcodone -acetaminophen  (NORCO) 5-325 mg tablet Take 1 tablet by mouth 2 (two) times daily as needed  isosorbide  mononitrate (IMDUR ) 120 MG ER tablet Take 1 tablet by mouth once daily  metoprolol  SUCCinate (TOPROL -XL) 100 MG XL tablet TAKE 1 AND 1/2 TABLETS BY MOUTH DAILY WITH OR IMMEDIATELY FOLLOWING A MEAL  nitroGLYcerin  (NITROSTAT ) 0.4 MG SL tablet Place 0.4 mg under  the tongue every 5 (five) minutes as needed  OZEMPIC 1 mg/dose (4 mg/3 mL) pen injector 1 (one) mg under skin once a week  pantoprazole  (PROTONIX ) 40 MG DR tablet Take 40 mg by mouth every morning before breakfast  XARELTO  20 mg tablet TAKE 1 TABLET BY MOUTH DAILY WITH SUPPER   No current facility-administered medications on file prior to visit.   Family History   Problem Relation Age of Onset  Stroke Sister  Skin cancer Sister  High blood pressure (Hypertension) Sister  Coronary Artery Disease (Blocked arteries around heart) Sister  Diabetes Sister    Social History   Tobacco Use  Smoking Status Never  Smokeless Tobacco Never    Social History   Socioeconomic History  Marital status: Married  Tobacco Use  Smoking status: Never  Smokeless tobacco: Never  Vaping Use  Vaping status: Never Used  Substance and Sexual Activity  Alcohol use: Yes  Drug use: Never   Social Drivers of Architectural Technologist Insecurity: No Food Insecurity (11/26/2023)  Received from [facility]  Hunger Vital Sign  Worried About Running Out of Food in the Last Year: Never true  Ran Out of Food in the Last Year: Never true  Transportation Needs: No Transportation Needs (11/26/2023)  Received from [facility] - Transportation  Lack of Transportation (Medical): No  Lack of Transportation (Non-Medical): No  Housing Stability: Unknown (12/03/2023)  Housing Stability Vital Sign  Homeless in the Last Year: No   ############################################################  Review of Systems: A complete review of systems (ROS) was obtained from the patient.  We have reviewed this information and discussed as appropriate with the patient.  See HPI as well for other pertinent ROS.  Constitutional: No fevers, chills, sweats. Weight stable Eyes: No vision changes, No discharge HENT: No sore throats, nasal drainage Lymph: No neck swelling, No bruising easily Pulmonary: No cough, productive sputum CV: No orthopnea, PND . No exertional chest/neck/shoulder/arm pain. Patient can walk 20 minutes without difficulty.   GI: No personal nor family history of other GI cancer, nor inflammatory bowel disease, irritable bowel syndrome, allergy such as Celiac Sprue, dietary/dairy problems, colitis, ulcers nor gastritis. No recent sick contacts/gastroenteritis. No travel  outside the country. No changes in diet.  Renal: No UTIs, No hematuria Genital: No drainage, bleeding, masses Musculoskeletal: No severe joint pain. Good ROM major joints Skin: No sores or lesions Heme/Lymph: No easy bleeding. No swollen lymph nodes Neuro: No active seizures. No facial droop Psych: No hallucinations. No agitation  OBJECTIVE   Vitals:  12/03/23 1602 12/03/23 1606  BP: 124/72  Pulse: 77  Temp: 36.7 C (98.1 F)  SpO2: 97%  Weight: (!) 117.8 kg (259 lb 12.8 oz)  Height: 177.8 cm (5' 10)  PainSc: 0-No pain   Body mass index is 37.28 kg/m.  PHYSICAL EXAM:  Constitutional: Not cachectic. Hygeine adequate. Vitals signs as above.  Eyes: Wears glasses - vision corrected,Pupils reactive, normal extraocular movements. Sclera nonicteric Neuro: CN II-XII intact. No major focal sensory defects. No major motor deficits. Lymph: No head/neck/groin lymphadenopathy Psych: No severe agitation. No severe anxiety. Judgment & insight Adequate, Oriented x4, HENT: Normocephalic, Mucus membranes moist. No thrush. Hearing: adequate Neck: Supple, No tracheal deviation. No obvious thyromegaly Chest: No pain to chest wall compression. Good respiratory excursion. No audible wheezing CV: Pulses intact. regular. No major extremity edema Ext: No obvious deformity or contracture. Edema: Not present. No cyanosis Skin: No major subcutaneous nodules. Warm and dry Musculoskeletal: Severe joint  rigidity not present. No obvious clubbing. No digital petechiae. Mobility: no assist device moving easily without restrictions  Abdomen: Obese Soft. Nondistended. Nontender. Hernia: Not present. Diastasis recti: Large supraumbilical midline. No hepatomegaly. No splenomegaly.  Genital/Pelvic: Inguinal hernia: Not present. Inguinal lymph nodes: without lymphadenopathy nor hidradenitis.   Rectal: (Deferred)    ###################################################################  Labs, Imaging and  Diagnostic Testing:  Located in 'Care Everywhere' section of Epic EMR chart  PRIOR [facility] CLINIC NOTES:  Not applicable  SURGERY NOTES:  Located in 'Care Everywhere' section of Epic EMR chart  PATHOLOGY:  Located in 'Care Everywhere' section of Epic EMR chart  Assessment and Plan:  DIAGNOSES:  Diagnoses and all orders for this visit:  Cancer of descending colon (CMS/HHS-HCC)  H/O cardiac radiofrequency ablation  Liver mass  Chronic anticoagulation  Diastasis recti    ASSESSMENT/PLAN  Pleasant male with cancer and descending colon polyp. No strong evidence of metastatic disease with small stable liver mass most likely cyst with hemangioma with no change in 3 years.  I think he would benefit from segmental colonic resection. Good candidate for robotic approach. Have to see where the tattooing is. It may actually be sigmoid since it is very redundant but we will see. Decent candidate for robotic approach given he is only had a lap chole done beforehand.  The anatomy & physiology of the digestive tract was discussed. The pathophysiology of the colon was discussed. Natural history risks without surgery was discussed. I feel the risks of no intervention will lead to serious problems that outweigh the operative risks; therefore, I recommended a partial colectomy to remove the pathology. Minimally invasive (Robotic/Laparoscopic) & open techniques were discussed.   Risks such as bleeding, infection, abscess, leak, reoperation, injury to other organs, need for repair of tissues / organs, possible ostomy, hernia, heart attack, stroke, death, and other risks were discussed. I noted a good likelihood this will help address the problem. Goals of post-operative recovery were discussed as well. Need for adequate nutrition, daily bowel regimen and healthy physical activity, to optimize recovery was noted as well. We will work to minimize complications. Educational materials were available as  well. Questions were answered. The patient expresses understanding & wishes to proceed with surgery.  He has a 13mm liver mass in the dome of his liver. Hemangioma is most likely favored. It seems rather small and has been stable for the past 3 years which argues against malignancy.   He is a diabetic. He is not insulin  requiring. We will double check an A1c since he is about due. Make sure that is out of control. He does take Ozempic for that. His last dose was last Sunday 11/29/2023. Will need to hold 7 days preop so asked him to hold on the next dose in the hopes we can schedule maybe as soon as next week since I had a cancellation.  He has a history of paroxysmal atrial fibrillation/flutter and had ablation 2021. He is chronically anticoagulated on Xarelto . He seems to have a decent performance status. He is followed closely by cardiology in [place], and they had cleared him for his colonoscopy. Double check with them but most likely they will be okay holding the Xarelto  2 days preop which is our usual protocol. Double check they do not feel like he needs any more clearance.     Elspeth KYM Schultze, MD, FACS, MASCRS Esophageal, Gastrointestinal & Colorectal Surgery Robotic and Minimally Invasive Surgery  [facility] [address] [place], [place] [address] [phone] Fax [phone] Main  CONTACT INFORMATION: Weekday (9AM-5PM): Call [facility] main office at [phone] Weeknight (5PM-9AM) or Weekend/Holiday: Check EPIC Web Links tab & use AMION (password  [email or web address]) for General Surgery [facility] coverage  Please, DO NOT use SecureChat  (it is not reliable communication to reach operating surgeons & will lead to a delay in care).   Epic staff messaging available for outptient concerns needing 1-2 business day response.     12/17/2023

## 2023-12-17 NOTE — Op Note (Addendum)
 12/17/2023  10:55 AM  PATIENT:  Lucas [name].  68 y.o. male  Patient Care Team: Rosamond Leta NOVAK, MD as PCP - General (Internal Medicine) Alvan, Dorn FALCON, MD as PCP - Cardiology (Cardiology) Inocencio Soyla Lunger, MD as PCP - Electrophysiology (Cardiology) Nori, Sari SQUIBB, RN as Oncology Nurse Navigator Sheldon Standing, MD as Consulting Physician (General Surgery) Pyrtle, Gordy HERO, MD as Consulting Physician (Gastroenterology)  PRE-OPERATIVE DIAGNOSIS:  DESCENDING COLON CANCER  POST-OPERATIVE DIAGNOSIS:  DISTAL TRANSVERSE COLON CANCER  PROCEDURE:   -ROBOTIC RESECTION OF SPLENIC FLEXURE OF COLON WITH ANASTOMOSIS -MOBILIZATION OF SPLENIC FLEXURE OF COLON -INTRAOPERATIVE ASSESSMENT OF TISSUE VASCULAR PERFUSION USING ICG (indocyanine green ) IMMUNOFLUORESCENCE -TRANSVERSUS ABDOMINIS PLANE (TAP) BLOCK - BILATERAL  SURGEON:  Standing KYM Sheldon, MD  ASSISTANT:  Bernarda Ned, MD  An experienced assistant was required given the standard of surgical care given the complexity of the case.  This assistant was needed for exposure, dissection, suction, tissue approximation, retraction, perception, etc  ANESTHESIA:  General endotracheal intubation anesthesia (GETA) and Regional TRANSVERSUS ABDOMINIS PLANE (TAP) nerve block -BILATERAL for perioperative & postoperative pain control at the level of the transverse abdominis & preperitoneal spaces along the flank at the anterior axillary line, from subcostal ridge to iliac crest under laparoscopic guidance provided with liposomal bupivacaine  (Experel) 20mL mixed with 50 mL of bupivicaine 0.25% with epinephrine   Estimated Blood Loss (EBL):   Total I/O In: 1450 [I.V.:1100; IV Piggyback:350] Out: 225 [Urine:200; Blood:25].   (See anesthesia record)  Delay start of Pharmacological VTE agent (>24hrs) due to concerns of significant anemia, surgical blood loss, or risk of bleeding?:  no  DRAINS: (None)  SPECIMEN:  colon (mid splenic flexure of colon to  distal descending colon)  DISPOSITION OF SPECIMEN:  Pathology  COUNTS:  Sponge, needle, & instrument counts CORRECT  PLAN OF CARE: Admit to inpatient   PATIENT DISPOSITION:  PACU - hemodynamically stable.  INDICATION:    Pleasant male underwent colonoscopy and found to have numerous polyps.  1 more fibrotic and concerning presumed to be near the proximal descending colon.  Adenocarcinoma found within it.  CAT scan without any obvious metastatic disease.  Surgical consultation requested.  I recommended segmental resection:  The anatomy & physiology of the digestive tract was discussed.  The pathophysiology was discussed.  Natural history risks without surgery was discussed.   I worked to give an overview of the disease and the frequent need to have multispecialty involvement.  I feel the risks of no intervention will lead to serious problems that outweigh the operative risks; therefore, I recommended a partial colectomy to remove the pathology.  Laparoscopic & open techniques were discussed.   Risks such as bleeding, infection, abscess, leak, reoperation, possible ostomy, hernia, heart attack, death, and other risks were discussed.  I noted a good likelihood this will help address the problem.   Goals of post-operative recovery were discussed as well.  We will work to minimize complications.  An educational handout on the pathology was given as well.  Questions were answered.    The patient expresses understanding & wishes to proceed with surgery.  OR FINDINGS:   Patient had tattooing and very distal transverse colon at the splenic flexure.  Has stellate shaped about hard fibrotic ulcer at site of polypectomy where cancer was found.  No obvious metastatic disease on visceral parietal peritoneum or liver.  It is an isoperistaltic colocolonic (mid transverse colon to distal descending colon ) anastomosis that rests in the left periumbilical region.  CASE DATA: Type of patient?: Elective [facility]  Private Case Status of Case? Elective Scheduled Infection Present At Time Of Surgery (PATOS)?  NO     DESCRIPTION:   Informed consent was confirmed.  The patient underwent general anaesthesia without difficulty.  The patient was positioned with arms tucked & secured appropriately.  VTE prevention in place.  The patient's abdomen was clipped, prepped, & draped in a sterile fashion.  Surgical timeout confirmed our plan.  The patient was positioned in reverse Trendelenburg.  Abdominal entry was gained using Varess technique at the left subcostal ridge on the anterior abdominal wall.  No elevated EtCO2 noted.  Port placed.  Camera inspection revealed no injury.  Extra ports were carefully placed under direct laparoscopic visualization.  Did inspection to the abdomen and found tattooing at the distal transverse colon.  Some redundant sigmoid colon but no tattooing there.  Decided to proceed with segmental partial colectomy focusing at the splenic flexure.  We docked the [employer] Vinci robot carefully and placed intstruments under visualization  I mobilized & reflected the greater omentum in the upper abdomen.  Position the small bowel into the left lower quadrant and pelvis.    I was able to elevate the mid and distal transverse colon to isolate the left middle colic pedicle.  I scored the distal transverse colon mesentery at the retroperitoneum just distal to the that.  Found the region at the mid/distal descending colon junction where I can place the left colic pedicle to tension.  Transected through the mesentery just proximal to the left colic pedicle.  I was able to get into the retroperitoneum and elevate the left colon mesentery cephalad.  With that I freed the left colon mesentery off the retroperitoneum in a medial to lateral fashion.  Focused on dense but soft adhesions to the left kidney injury of his fascia as well as adrenal gland.  Toupet more laterally.  Was able to come more cephalad and  come underneath the splenic flexure of the colon where there was retroperitoneal tattooing there as well.  Came more proximally.  I then freed the greater omentum off the mid transverse colon to try and get into the lesser sac.  Gayleen great omentum more distally towards the splenic flexure of the colon.  With this I was able to isolate the transverse colon retroperitoneal attachments along the inferior pancreatic ridge and come through those for a good splenic flexure mobilization.  Did mobilization in a lateral to medial fashion from the distal descending colon up towards the splenic flexure.  Alternate eventually completely mobilized the splenic flexure and had good mobility from the mid transverse colon to the distal descending colon.  Transected the mesentery radially at that mid/distal descending colon mesentery.  Also at the mid to distal transverse colon mesentery.  Confirmed hide good proximal and distal margins.  Then work to free greater omentum off the mid transverse colon.  Ended up having to free off adhesions of the proximal transverse colon off the liver from the patient's prior open cholecystectomy.  That provided some mobility.  Work to mobilize the distal ascending colon probable sigmoid attachments to the left lower quadrant paracolic gutter.  To assess vascular perfusion of tissues, we asked anesthesia use intravenous  indocyanine green  (ICG) with IV flush.  I switched to the NIR fluorescence (Firefly mode) imaging window on the daVinci robot platform.  We were able to see good light green visualization of blood vessels with good vascular perfusion of tissues, confirming  good tissue perfusion of tissues (mid transverse colon and distal descending colon) planned for anastomosis.  We went ahead and proceeded with transection.  We transected at the mid/distal transverse colon junction with a robotic stapler 60mm blue load.  We then transected at the distal descending colon with a robotic  stapler 60mm blue load.  We confirmed hemostasis.   It off the last few omental attachments to the splenic flexure of the colon and sent the specimen out of the way in the right lower quadrant.  Will initially it seemed there was decent mobility there was some contraction of of the descending and transverse colon's.  Work to do a little morbility of the transverse colon cephalad and great omentum off that.  Ended up elevating and taking some of the middle colic pedicle after getting a good critical view preserving a good right middle colic pedicle.  That allowed more mobility of the transverse colon.  We reposition the patient and more headdown Trendelenburg and more aggressively freed off some sigmoid colon attachments to the anterior abdominal wall and retroperitoneum.  Mobilized the sigmoid colon and in a lateral to medial fashion.  That provide much better mobility.  This allowed to the proximal and distal ends of the colon to overlap without tension.  Again confirmed good hemostasis on both sides with perforators to the colon pulsatile and viable on both proximal and distal ends.  I did a side-to-side stapled anastomosis of mid-transverse colon to distal descending colon using a 60mm white load in an isoperistaltic fashion.  (Distal stump of mid transverse colon to the descending/sigmoid colon junction for the distal end of the anastomosis.  Proximal end of descending colon colon stump to more proximal transverse colon for the proximal end of the anastomosis).  Confirmed by healthy anastomosis with good hemostasis and viable mucosa.  I sewed the common staple channel wound with an absorbable suture (V-lock) in a running [place] fashion from each corner and meeting in the center.  We did meticulous inspection prove an airtight closure.  I protected the anastomosis line with an anterior omentopexy of greater omentum using V lock suture.    We did reinspection of the abdomen.  Hemostasis was good.   Ureters,  retroperitoneum, and bowel uninjured.  The anastomosis looked healthy.   Endoluminal gas was evacuated.  We placed the wound protector through the suprapubic 12mm port site after it was enlarged in a Pfannenstiel fashion.  Specimen removed without incident.  I opened up the specimen on the back table and confirmed a stellate fibrotic scar at the tattooing that seem consistent with the adenocarcinoma found in the polyp.  Good margins.  No hard rocky lymphadenopathy noted.  Ports & wound protector removed.  Hemostasis was good.  Sterile unused instruments were used from this point.  I closed the skin at the port sites using Monocryl stitch and sterile dressing.  I closed the extraction wound using a 0 Vicryl vertical peritoneal closure and a #1 PDS transverse anterior rectal fascial closure like a small Pfannenstiel closure. I closed the skin with some interrupted Monocryl stitches.  I placed sterile dressings.     Patient is being extubated go to recovery room. I discussed postop care with the patient in detail the office & in the holding area. Instructions are written. I discussed operative findings, updated the patient's status, discussed probable steps to recovery, and gave postoperative recommendations to the patient's spouse, Lucas [name] .  Recommendations were made.  Questions were answered.  She expressed understanding & appreciation.  Lucas [name], M.D., F.A.C.S. Gastrointestinal and Minimally Invasive Surgery [facility] [address] [place], [place] [address] [phone] Main / Paging

## 2023-12-17 NOTE — Interval H&P Note (Signed)
 History and Physical Interval Note:  12/17/2023 7:30 AM  Lucas [name].  has presented today for surgery, with the diagnosis of COLON CANCER.  The various methods of treatment have been discussed with the patient and family. After consideration of risks, benefits and other options for treatment, the patient has consented to  Procedure(s): XI ROBOTIC RESECTION OF COLON DESCENDING AND SIGMOID ([name]) FLEXIBLE SIGMOIDOSCOPY ([name]) as a surgical intervention.  The patient's history has been reviewed, patient examined, no change in status, stable for surgery.  I have reviewed the patient's chart and labs.  Questions were answered to the patient's satisfaction.    I have re-reviewed the the patient's records, history, medications, and allergies.  I have re-examined the patient.  I again discussed intraoperative plans and goals of post-operative recovery.  The patient agrees to proceed.  Lucas Jacuinde Lovick Jr.  12-22-1955 [id number]  Patient Care Team: Rosamond Leta NOVAK, MD as PCP - General (Internal Medicine) Alvan Dorn FALCON, MD as PCP - Cardiology (Cardiology) Inocencio Soyla Lunger, MD as PCP - Electrophysiology (Cardiology) Nori Sari SQUIBB, RN as Oncology Nurse Navigator Sheldon Standing, MD as Consulting Physician (General Surgery) Pyrtle, Gordy HERO, MD as Consulting Physician (Gastroenterology)  Patient Active Problem List   Diagnosis Date Noted   Cancer of descending colon [facility]) 11/26/2023   Atypical atrial flutter (HCC) 08/28/2020   Secondary hypercoagulable state (HCC) 08/27/2020   Cerebrovascular accident (CVA) (HCC)    Paroxysmal atrial fibrillation (HCC)    Angina pectoris (HCC) 07/27/2020   Atrial fibrillation with RVR (HCC) 05/09/2020   Bilateral carpal tunnel syndrome 08/16/2019   Ulnar neuropathy at elbow 08/16/2019   Typical atrial flutter (HCC)    Obesity 05/30/2016   Leukocytosis 05/30/2016   AKI (acute kidney injury) (HCC) 05/30/2016   Diabetes mellitus, type II (HCC) 01/30/2016    Coronary artery disease involving native coronary artery of native heart with angina pectoris (HCC); Apical LAD 80%, LPDA ~45%. D2 ~60% & non-dominant RCA 80%.    Hypertension    Hyperlipidemia    Sleep apnea    Atrial fibrillation with rapid ventricular response (HCC) 01/29/2016   Elevated troponin     Past Medical History:  Diagnosis Date   ADHD (attention deficit hyperactivity disorder)    Arthritis    Bilateral carpal tunnel syndrome 08/16/2019   CAD (coronary artery disease)    a. LHC on 01/29/16 which revealed significant apical LAD stenosis, best treated medically. There was moderate disease in the D2 and mid nondominant RCA.SABRA No PCI performed  b. 03/2018: repeat cath showing regression of his LAD stenosis now being at 50% with 60% mid RCA stenosis, 45% ostial LPDA stenosis, and 45% ostial second diagonal stenosis.   Cancer [facility])    colon   Cataract    Chronic back pain    Diabetes mellitus    type 2   Dysrhythmia    A.fib  had ablation   GERD (gastroesophageal reflux disease)    Heart disease    History of kidney stones    Hyperlipidemia    Hypertension    Obesity    PAF (paroxysmal atrial fibrillation) (HCC) 01/2016   Sleep apnea    has CPAP machine but not wear   Stroke (HCC)    Hx TIAs   Tubular adenoma of colon 08/2012   Ulnar neuropathy at elbow 08/16/2019   Bilateral    Past Surgical History:  Procedure Laterality Date   ANKLE SURGERY Left 11/10/2010   tendon repair  ATRIAL FIBRILLATION ABLATION [name] 08/24/2020   Procedure: ATRIAL FIBRILLATION ABLATION;  Surgeon: Inocencio Soyla Lunger, MD;  [facility]: [facility] IN[facility] LAB;  Service: Cardiovascular;  Laterality: [name];   CARDIAC CATHETERIZATION [name] 01/29/2016   Procedure: Left Heart Cath and Coronary Angiography;  Surgeon: Victory LELON Sharps, MD;  Location: [facility] INVASIVE CV LAB;  Service: Cardiovascular;  Laterality: [name];   CARPAL TUNNEL RELEASE Right 09/06/2019   Procedure: CARPAL TUNNEL RELEASE;  Surgeon: Murrell Kuba,  MD;  Location: [facility];  Service: Orthopedics;  Laterality: Right;   CARPAL TUNNEL RELEASE Left 10/11/2019   Procedure: LEFT CARPAL TUNNEL RELEASE;  Surgeon: Murrell Kuba, MD;  Location: [facility];  Service: Orthopedics;  Laterality: Left;  IV REGIONAL FOREARM BLOCK   CATARACT EXTRACTION W/PHACO  11/11/2012   Procedure: CATARACT EXTRACTION PHACO AND INTRAOCULAR LENS PLACEMENT (IOC);  Surgeon: Cherene Mania, MD;  Location: [facility];  Service: Ophthalmology;  Laterality: Right;  CDE:  5.56   CATARACT EXTRACTION W/PHACO Left 12/15/2013   Procedure: CATARACT EXTRACTION PHACO AND INTRAOCULAR LENS PLACEMENT (IOC);  Surgeon: Cherene Mania, MD;  Location: [facility];  Service: Ophthalmology;  Laterality: Left;  CDE:13.36   CHOLECYSTECTOMY     CHONDROPLASTY Right 05/28/2017   Procedure: RIGHT KNEE CHONDROPLASTY;  Surgeon: Beverley Toribio FALCON, MD;  Location: [facility];  Service: Orthopedics;  Laterality: Right;   COLONOSCOPY     CYSTOSCOPY W/ URETERAL STENT PLACEMENT     right   ELECTROPHYSIOLOGIC STUDY [name] 06/02/2016   Procedure: A-Flutter Ablation;  Surgeon: Will Lunger Inocencio, MD;  Location: [facility];  Service: Cardiovascular;  Laterality: [name];   KNEE ARTHROSCOPY WITH MEDIAL MENISECTOMY Right 05/28/2017   Procedure: KNEE ARTHROSCOPY WITH MEDIAL MENISECTOMY WITH EXTENSIVE SYNOVECTOMY;  Surgeon: Beverley Toribio FALCON, MD;  Location: [facility];  Service: Orthopedics;  Laterality: Right;   LEFT HEART CATH AND CORONARY ANGIOGRAPHY [name] 03/30/2018   Procedure: LEFT HEART CATH AND CORONARY ANGIOGRAPHY;  Surgeon: Anner Alm LELON, MD;  Location: [facility] INVASIVE CV LAB;  Service: Cardiovascular;  Laterality: [name];   LEFT HEART CATH AND CORONARY ANGIOGRAPHY [name] 07/27/2020   Procedure: LEFT HEART CATH AND CORONARY ANGIOGRAPHY;  Surgeon: Wonda Sharper, MD;  Location: [facility] INVASIVE CV LAB;  Service: Cardiovascular;  Laterality: [name];   left knee sugery Left     Arthroscopy   NASAL SINUS SURGERY     POLYPECTOMY     SHOULDER ARTHROSCOPY WITH BICEPS TENDON REPAIR Right    TEE WITHOUT CARDIOVERSION [name] 08/23/2020   Procedure: TRANSESOPHAGEAL ECHOCARDIOGRAM (TEE);  Surgeon: Alveta Aleene PARAS, MD;  Location: [facility] ENDOSCOPY;  Service: Cardiovascular;  Laterality: [name];   TONSILLECTOMY      Social History   Socioeconomic History   Marital status: Married    Spouse name: Not on file   Number of children: Not on file   Years of education: 12   Highest education level: Not on file  Occupational History   Not on file  Tobacco Use   Smoking status: Never   Smokeless tobacco: Never  Vaping Use   Vaping status: Never Used  Substance and Sexual Activity   Alcohol use: Yes    Alcohol/week: 0.0 standard drinks of alcohol    Comment: rare occasion   Drug use: No   Sexual activity: Yes    Birth control/protection: None  Other Topics Concern   Not on file  Social History Narrative   Right handed   Caffeine~ 1-2 cups per day  Lives at home with wife Grayce    Social Drivers of Health   Financial Resource Strain: Not on file  Food Insecurity: No Food Insecurity (11/26/2023)   Hunger Vital Sign    Worried About Running Out of Food in the Last Year: Never true    Ran Out of Food in the Last Year: Never true  Transportation Needs: No Transportation Needs (11/26/2023)   PRAPARE - Administrator, Civil Service (Medical): No    Lack of Transportation (Non-Medical): No  Physical Activity: Not on file  Stress: Not on file  Social Connections: Not on file  Intimate Partner Violence: Not At Risk (11/26/2023)   Humiliation, Afraid, Rape, and Kick questionnaire    Fear of Current or Ex-Partner: No    Emotionally Abused: No    Physically Abused: No    Sexually Abused: No    Family History  Problem Relation Age of Onset   Heart murmur Mother    Aneurysm Mother    Hypertension Mother    Cirrhosis Father    Heart attack Sister    Diabetes  Sister    Aneurysm Maternal Grandmother    Stroke Maternal Grandmother    Heart attack Paternal Grandmother    Stroke Sister    Colon cancer Neg Hx    Rectal cancer Neg Hx    Stomach cancer Neg Hx     Medications Prior to Admission  Medication Sig Dispense Refill Last Dose/Taking   atorvastatin  (LIPITOR ) 80 MG tablet TAKE 1 TABLET BY MOUTH DAILY AT 6 PM. 90 tablet 1 12/16/2023   diltiazem  (CARDIZEM  CD) 240 MG 24 hr capsule TAKE 1 CAPSULE (240 MG TOTAL) BY MOUTH EVERY EVENING. 90 capsule 3 12/16/2023   furosemide  (LASIX ) 20 MG tablet Take 1 tablet (20 mg total) by mouth daily as needed for fluid (swelling). 30 tablet 3 12/16/2023   gabapentin  (NEURONTIN ) 300 MG capsule Take 600 mg by mouth 2 (two) times daily.   12/17/2023 at  3:00 AM   glimepiride  (AMARYL ) 4 MG tablet Take 4 mg by mouth 2 (two) times daily.    12/16/2023   HYDROcodone -acetaminophen  (NORCO/VICODIN) 5-325 MG tablet Take 1 tablet by mouth 2 (two) times daily as needed for moderate pain (pain score 4-6).   12/16/2023   isosorbide  mononitrate (IMDUR ) 120 MG 24 hr tablet TAKE 1 TABLET BY MOUTH EVERY DAY 90 tablet 2 12/17/2023 at  3:00 AM   metoprolol  succinate (TOPROL -XL) 100 MG 24 hr tablet TAKE 1 AND 1/2 TABLETS BY MOUTH DAILY WITH OR IMMEDIATELY FOLLOWING A MEAL (Patient taking differently: Take 50-100 mg by mouth See admin instructions. Take 50 mg by mouth in the morning and 100 mg at night) 135 tablet 1 12/17/2023 at  3:00 AM   nitroGLYCERIN  (NITROSTAT ) 0.4 MG SL tablet Place 1 tablet (0.4 mg total) under the tongue every 5 (five) minutes as needed for chest pain. 25 tablet 3 Taking As Needed   OZEMPIC, 2 MG/DOSE, 8 MG/3ML SOPN Inject 2 mg into the skin once a week.   Past Month   pantoprazole  (PROTONIX ) 40 MG tablet Take 40 mg by mouth daily before breakfast.    12/17/2023 at  3:00 AM   SYNJARDY  XR 12.03-999 MG TB24 Take 1 tablet by mouth in the morning and at bedtime.   12/13/2023   XARELTO  20 MG TABS tablet TAKE 1 TABLET BY MOUTH DAILY WITH  SUPPER 30 tablet 5 12/13/2023    Current Facility-Administered Medications  Medication Dose Route Frequency  Provider Last Rate Last Admin   bupivacaine  liposome (EXPAREL ) 1.3 % injection 266 mg  20 mL Infiltration Once Sheldon Standing, MD       cefoTEtan  (CEFOTAN ) 2 g in sodium chloride  0.9 % 100 mL IVPB  2 g Intravenous On Call to OR Sheldon Standing, MD       chlorhexidine  (PERIDEX ) 0.12 % solution 15 mL  15 mL Mouth/Throat Once Ellender, Bernardino SQUIBB, MD       Or   Oral care mouth rinse  15 mL Mouth Rinse Once Ellender, Bernardino SQUIBB, MD       NOREEN ON 12/18/2023] feeding supplement (ENSURE PRE-SURGERY) liquid 296 mL  296 mL Oral Once Sheldon Standing, MD       feeding supplement (ENSURE PRE-SURGERY) liquid 592 mL  592 mL Oral Once Sheldon Standing, MD       gabapentin  (NEURONTIN ) capsule 200 mg  200 mg Oral On Call to OR Sheldon Standing, MD       insulin  aspart (novoLOG ) injection 0-14 Units  0-14 Units Subcutaneous Q2H PRN Epifanio Charleston, MD   4 Units at 12/17/23 9381   lactated ringers  infusion   Intravenous Continuous Ellender, Bernardino SQUIBB, MD 10 mL/hr at 12/17/23 9350 Continued from Pre-op at 12/17/23 9350   sodium chloride  0.9 % with cefoTEtan  (CEFOTAN ) ADS Med            Facility-Administered Medications Ordered in Other Encounters  Medication Dose Route Frequency Provider Last Rate Last Admin   0.9 %  sodium chloride  infusion   Intravenous Continuous PRN Golob, Jamie C., CRNA   New Bag at 08/31/20 0745     Allergies  Allergen Reactions   Bee Venom Swelling   Codeine Nausea Only    BP (!) 147/73 (BP Location: Right Arm)   Pulse 64   Temp 97.8 F (36.6 C) (Oral)   Resp 16   Ht 5' 10 (1.778 m)   Wt 115.2 kg   SpO2 97%   BMI 36.45 kg/m   Labs: Results for orders placed or performed during the hospital encounter of 12/17/23 (from the past 48 hours)  Glucose, capillary     Status: Abnormal   Collection Time: 12/17/23  5:26 AM  Result Value Ref Range   Glucose-Capillary 195 (H) 70 - 99 mg/dL     Comment: Glucose reference range applies only to samples taken after fasting for at least 8 hours.   Comment 1 Notify RN    Comment 2 Document in Chart     Imaging / Studies: CT ABDOMEN PELVIS W CONTRAST Result Date: 11/26/2023 CLINICAL DATA:  Malignant neoplasm of descending colon on recent colonoscopy. * Tracking Code: BO * EXAM: CT ABDOMEN AND PELVIS WITH CONTRAST TECHNIQUE: Multidetector CT imaging of the abdomen and pelvis was performed using the standard protocol following bolus administration of intravenous contrast. RADIATION DOSE REDUCTION: This exam was performed according to the departmental dose-optimization program which includes automated exposure control, adjustment of the mA and/or kV according to patient size and/or use of iterative reconstruction technique. CONTRAST:  OMNIPAQUE  IOHEXOL  300 MG/ML  SOLN COMPARISON:  Chest CTA 09/18/2023. Noncontrast abdominal CT 02/12/2016. FINDINGS: Lower chest: Calcified left lower lobe granuloma and old left-sided rib fractures are noted. The lung bases are otherwise clear. Aortic and coronary artery atherosclerosis noted. Hepatobiliary: Background hepatic steatosis. There is a hyperattenuating lesion measuring 1.4 x 1.4 cm within the dome of the right hepatic lobe (segment VII), not clearly seen on previous noncontrast study. No other focal  liver lesions are identified. No evidence of significant biliary dilatation status post cholecystectomy. Pancreas: Unremarkable. No pancreatic ductal dilatation or surrounding inflammatory changes. Spleen: Normal in size without focal abnormality. Adrenals/Urinary Tract: Both adrenal glands appear normal. No evidence of urinary tract calculus, suspicious renal lesion or hydronephrosis. The bladder appears unremarkable for its degree of distention. Stomach/Bowel: Enteric contrast has passed into the mid small bowel. The stomach appears unremarkable for its degree of distention. No bowel distension, wall thickening  or surrounding inflammation identified. The appendix appears normal. There is moderate stool throughout the colon. No colonic masses are identified. Vascular/Lymphatic: There are no enlarged abdominal or pelvic lymph nodes. Mild aortic and branch vessel atherosclerosis without evidence of large vessel occlusion or aneurysm. Circumaortic left renal vein. Reproductive: The prostate gland and seminal vesicles appear unremarkable. Other: Intact abdominal wall. No ascites, peritoneal nodularity or pneumoperitoneum. Chronic areas of fat necrosis are demonstrated in the pelvis, unchanged. Musculoskeletal: No acute or significant osseous findings. 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 IMPRESSION: 1. No colonic masses identified. 2. Indeterminate hyperattenuating lesion within the dome of the right hepatic lobe, not clearly seen on previous noncontrast study. This could reflect a flash filling hemangioma, FNH or adenoma. Appearance would be atypical for metastatic disease. MRI could be performed for further characterization if clinically warranted. 3. No other evidence of metastatic disease in the abdomen or pelvis. 4.  Aortic Atherosclerosis (ICD10-I70.0). Electronically Signed   By: Elsie Perone M.D.   On: 11/26/2023 12:40     .Lucas [name], M.D., F.A.C.S. Gastrointestinal and Minimally Invasive Surgery [facility] [address] [place], [place] [address] [phone] Main / Paging  12/17/2023 7:30 AM    Lucas [name]

## 2023-12-17 NOTE — Transfer of Care (Signed)
 Immediate Anesthesia Transfer of Care Note  Patient: Lucas [name].  Procedure(s) Performed: XI ROBOTIC PARTIAL COLOECTOMY, SPLENIC FLEXTURE MOBILIZATION, BILATERAL TAP BLOCK  Patient Location: PACU  Anesthesia Type:General  Level of Consciousness: drowsy  Airway & Oxygen Therapy: Patient Spontanous Breathing and Patient connected to face mask oxygen  Post-op Assessment: Report given to RN and Post -op Vital signs reviewed and stable  Post vital signs: Reviewed and stable  Last Vitals:  Vitals Value Taken Time  BP 136/76 12/17/23 1052  Temp    Pulse 70 12/17/23 1053  Resp 20 12/17/23 1053  SpO2 100 % 12/17/23 1053  Vitals shown include unfiled device data.  Last Pain:  Vitals:   12/17/23 0550  TempSrc:   PainSc: 0-No pain         Complications: No notable events documented.

## 2023-12-18 LAB — BASIC METABOLIC PANEL
Anion gap: 7 (ref 5–15)
BUN: 14 mg/dL (ref 8–23)
CO2: 24 mmol/L (ref 22–32)
Calcium: 8.5 mg/dL — ABNORMAL LOW (ref 8.9–10.3)
Chloride: 104 mmol/L (ref 98–111)
Creatinine, Ser: 0.81 mg/dL (ref 0.61–1.24)
GFR, Estimated: >60 mL/min (ref >60)
Glucose, Bld: 251 mg/dL — ABNORMAL HIGH (ref 70–99)
Potassium: 5.1 mmol/L (ref 3.5–5.1)
Sodium: 135 mmol/L (ref 135–145)

## 2023-12-18 LAB — CBC
HCT: 39.9 % (ref 39.0–52.0)
Hemoglobin: 13.5 g/dL (ref 13.0–17.0)
MCH: 31.6 pg (ref 26.0–34.0)
MCHC: 33.8 g/dL (ref 30.0–36.0)
MCV: 93.4 fL (ref 80.0–100.0)
Platelets: 156 10*3/uL (ref 150–400)
RBC: 4.27 MIL/uL (ref 4.22–5.81)
RDW: 13.4 % (ref 11.5–15.5)
WBC: 15.7 10*3/uL — ABNORMAL HIGH (ref 4.0–10.5)
nRBC: 0 % (ref 0.0–0.2)

## 2023-12-18 LAB — GLUCOSE, CAPILLARY
Glucose-Capillary: 220 mg/dL — ABNORMAL HIGH (ref 70–99)
Glucose-Capillary: 226 mg/dL — ABNORMAL HIGH (ref 70–99)
Glucose-Capillary: 153 mg/dL — ABNORMAL HIGH (ref 70–99)
Glucose-Capillary: 205 mg/dL — ABNORMAL HIGH (ref 70–99)

## 2023-12-18 LAB — MAGNESIUM: Magnesium: 1.9 mg/dL (ref 1.7–2.4)

## 2023-12-18 NOTE — Plan of Care (Signed)
  Problem: Education: Goal: Understanding of discharge needs will improve Outcome: Progressing Goal: Verbalization of understanding of the causes of altered bowel function will improve Outcome: Progressing   Problem: Activity: Goal: Ability to tolerate increased activity will improve Outcome: Progressing   Problem: Bowel/Gastric: Goal: Gastrointestinal status for postoperative course will improve Outcome: Progressing   Problem: Health Behavior/Discharge Planning: Goal: Identification of community resources to assist with postoperative recovery needs will improve Outcome: Progressing   Problem: Nutritional: Goal: Will attain and maintain optimal nutritional status will improve Outcome: Progressing   Problem: Clinical Measurements: Goal: Postoperative complications will be avoided or minimized Outcome: Progressing   Problem: Respiratory: Goal: Respiratory status will improve Outcome: Progressing   Problem: Health Behavior/Discharge Planning: Goal: Ability to manage health-related needs will improve Outcome: Progressing   Problem: Clinical Measurements: Goal: Ability to maintain clinical measurements within normal limits will improve Outcome: Progressing Goal: Will remain free from infection Outcome: Progressing Goal: Diagnostic test results will improve Outcome: Progressing Goal: Respiratory complications will improve Outcome: Progressing Goal: Cardiovascular complication will be avoided Outcome: Progressing   Problem: Activity: Goal: Risk for activity intolerance will decrease Outcome: Progressing   Problem: Nutrition: Goal: Adequate nutrition will be maintained Outcome: Progressing   Problem: Coping: Goal: Level of anxiety will decrease Outcome: Progressing   Problem: Elimination: Goal: Will not experience complications related to bowel motility Outcome: Progressing Goal: Will not experience complications related to urinary retention Outcome:  Progressing   Problem: Pain Managment: Goal: General experience of comfort will improve and/or be controlled Outcome: Progressing   Problem: Safety: Goal: Ability to remain free from injury will improve Outcome: Progressing   Problem: Skin Integrity: Goal: Risk for impaired skin integrity will decrease Outcome: Progressing   Problem: Education: Goal: Ability to describe self-care measures that may prevent or decrease complications (Diabetes Survival Skills Education) will improve Outcome: Progressing Goal: Individualized Educational Video(s) Outcome: Progressing   Problem: Coping: Goal: Ability to adjust to condition or change in health will improve Outcome: Progressing   Problem: Fluid Volume: Goal: Ability to maintain a balanced intake and output will improve Outcome: Progressing   Problem: Health Behavior/Discharge Planning: Goal: Ability to identify and utilize available resources and services will improve Outcome: Progressing Goal: Ability to manage health-related needs will improve Outcome: Progressing   Problem: Metabolic: Goal: Ability to maintain appropriate glucose levels will improve Outcome: Progressing   Problem: Skin Integrity: Goal: Risk for impaired skin integrity will decrease Outcome: Progressing   Problem: Tissue Perfusion: Goal: Adequacy of tissue perfusion will improve Outcome: Progressing

## 2023-12-18 NOTE — Progress Notes (Signed)
 Mobility Specialist - Progress Note   12/18/23 0929  Mobility  Activity Ambulated with assistance in hallway;Ambulated with assistance to bathroom  Level of Assistance Modified independent, requires aide device or extra time  Assistive Device Front wheel walker  Distance Ambulated (ft) 500 ft  Activity Response Tolerated well  Mobility Referral Yes  Mobility visit 1 Mobility  Mobility Specialist Start Time (ACUTE ONLY) 0855  Mobility Specialist Stop Time (ACUTE ONLY) 0929  Mobility Specialist Time Calculation (min) (ACUTE ONLY) 34 min   Pt received in bed and agreeable to mobility. Pt found to bed soiled. Assisted w/ cleaning up. No complaints during session. Pt to recliner after session with all needs met.   [facility]

## 2023-12-18 NOTE — TOC Initial Note (Signed)
 Transition of Care (TOC) - Initial/Assessment Note    Patient Details  Name: Lucas [name]. MRN: [id number] Date of Birth: 10-Apr-1956  Transition of Care [facility]) CM/SW Contact:    Bascom Service, RN Phone Number: 12/18/2023, 1:09 PM  Clinical Narrative:d/c plan home.                   Expected Discharge Plan: Home/Self Care Barriers to Discharge: Continued Medical Work up   Patient Goals and CMS Choice Patient states their goals for this hospitalization and ongoing recovery are:: Home CMS Medicare.gov Compare Post Acute Care list provided to:: Patient Choice offered to / list presented to : Patient      Expected Discharge Plan and Services                                              Prior Living Arrangements/Services                       Activities of Daily Living   ADL Screening (condition at time of admission) Independently performs ADLs?: Yes (appropriate for developmental age) Is the patient deaf or have difficulty hearing?: Yes Does the patient have difficulty seeing, even when wearing glasses/contacts?: No Does the patient have difficulty concentrating, remembering, or making decisions?: No  Permission Sought/Granted                  Emotional Assessment              Admission diagnosis:  Colon cancer [facility]) [C18.9] Patient Active Problem List   Diagnosis Date Noted   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 12/17/2023 12/17/2023   Cancer of descending colon (HCC) 11/26/2023   Atypical atrial flutter (HCC) 08/28/2020   Secondary hypercoagulable state (HCC) 08/27/2020   Cerebrovascular accident (CVA) (HCC)    Paroxysmal atrial fibrillation (HCC)    Angina pectoris (HCC) 07/27/2020   Atrial fibrillation with RVR (HCC) 05/09/2020   Bilateral carpal tunnel syndrome 08/16/2019   Ulnar neuropathy at elbow 08/16/2019   Typical atrial flutter (HCC)    Obesity 05/30/2016   Leukocytosis 05/30/2016   AKI (acute kidney  injury) (HCC) 05/30/2016   Diabetes mellitus, type II (HCC) 01/30/2016   Coronary artery disease involving native coronary artery of native heart with angina pectoris (HCC); Apical LAD 80%, LPDA ~45%. D2 ~60% & non-dominant RCA 80%.    Hypertension    Hyperlipidemia    Sleep apnea    Atrial fibrillation with rapid ventricular response (HCC) 01/29/2016   Elevated troponin    PCP:  Rosamond Leta NOVAK, MD Pharmacy:   [phone] [place], [place] - [address] AT [facility] [address] [place] [address] Phone: [phone] Fax: [phone]  [facility] - [facility] [address] [place] [address] Phone: [phone] Fax: [phone]     Social Drivers of Health (SDOH) Social History: SDOH Screenings   Food Insecurity: No Food Insecurity (12/17/2023)  Housing: Low Risk  (12/17/2023)  Transportation Needs: No Transportation Needs (12/17/2023)  Utilities: Not At Risk (12/17/2023)  Depression (PHQ2-9): Low Risk  (11/26/2023)  Social Connections: Moderately Integrated (12/17/2023)  Tobacco Use: Low Risk  (12/17/2023)   SDOH Interventions:     Readmission Risk Interventions     No data to display

## 2023-12-18 NOTE — Anesthesia Postprocedure Evaluation (Signed)
 Anesthesia Post Note  Patient: Lucas [name].  Procedure(s) Performed: XI ROBOTIC PARTIAL COLOECTOMY, SPLENIC FLEXTURE MOBILIZATION, BILATERAL TAP BLOCK     Patient location during evaluation: PACU Anesthesia Type: General Level of consciousness: awake and alert Pain management: pain level controlled Vital Signs Assessment: post-procedure vital signs reviewed and stable Respiratory status: spontaneous breathing, nonlabored ventilation, respiratory function stable and patient connected to nasal cannula oxygen Cardiovascular status: blood pressure returned to baseline and stable Postop Assessment: no apparent nausea or vomiting Anesthetic complications: no   No notable events documented.  Last Vitals:  Vitals:   12/18/23 0528 12/18/23 0945  BP: 133/73 117/63  Pulse: 72 70  Resp: 18 17  Temp: 36.8 C 36.9 C  SpO2: 92% 94%    Last Pain:  Vitals:   12/18/23 1000  TempSrc:   PainSc: 0-No pain                 Epifanio Lamar BRAVO

## 2023-12-18 NOTE — Progress Notes (Signed)
 Called surgery. Left message for PA.  Pt would like to know when he can advance his diet.

## 2023-12-18 NOTE — Progress Notes (Signed)
 12/18/2023  Dempsey ORN Kathlynn Raddle. [id number] 09/29/1956  CARE TEAM: PCP: Rosamond Leta NOVAK, MD  Outpatient Care Team: Patient Care Team: Rosamond Leta NOVAK, MD as PCP - General (Internal Medicine) Alvan Dorn FALCON, MD as PCP - Cardiology (Cardiology) Inocencio Soyla Lunger, MD as PCP - Electrophysiology (Cardiology) Nori, Sari SQUIBB, RN as Oncology Nurse Navigator Sheldon Standing, MD as Consulting Physician (General Surgery) Pyrtle, Gordy HERO, MD as Consulting Physician (Gastroenterology)  Inpatient Treatment Team: Treatment Team:  Sheldon Standing, MD Cloretta Arley NOVAK, MD Debora Ou, RN   Problem List:   Principal Problem:   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 D2 ~60% & non-dominant RCA 80%.   Hypertension   Hyperlipidemia   Sleep apnea   Diabetes mellitus, type II (HCC)   Obesity   12/17/2023  POST-OPERATIVE DIAGNOSIS:  DISTAL TRANSVERSE COLON CANCER   PROCEDURE:   -ROBOTIC RESECTION OF SPLENIC FLEXURE OF COLON WITH ANASTOMOSIS -MOBILIZATION OF SPLENIC FLEXURE OF COLON -INTRAOPERATIVE ASSESSMENT OF TISSUE VASCULAR PERFUSION USING ICG (indocyanine green ) IMMUNOFLUORESCENCE -TRANSVERSUS ABDOMINIS PLANE (TAP) BLOCK - BILATERAL   SURGEON:  Standing KYM Sheldon, MD  OR FINDINGS:    Patient had tattooing and very distal transverse colon at the splenic flexure.  Has stellate shaped about hard fibrotic ulcer at site of polypectomy where cancer was found.  No obvious metastatic disease on visceral parietal peritoneum or liver.   It is an isoperistaltic colocolonic (mid transverse colon to distal descending colon ) anastomosis that rests in the left periumbilical region.   CASE DATA:   Type of patient?: Elective [facility] Private Case Status of Case? Elective Scheduled Infection Present At Time Of Surgery (PATOS)?  NO  Assessment [facility]  Stay = 1 days) 1 Day Post-Op    Recovering well so far    Plan:  ERAS protocol  On thick liquid diet.  Advance to solid diet gradually.  IV fluids Med[place].  Diabetes with some hyperglycemia.  Resuming home meds and has sliding scale insulin  for breakthrough.  Blood pressure control.  Borderline elevated potassium.  Not severe.  Medlock.  Check tomorrow.  Follow-up in pathology  -VTE prophylaxis- SCDs, Lovenox    -mobilize as tolerated to help recovery.  The fact he walked postop day 0 he is a good start.  He normally has pretty decent performance status, so I do not feel strongly that he is going to need therapies.  -Disposition:  Disposition:  The patient is from: Home Anticipate discharge to:  Home Anticipated Date of Discharge is:  February 8,2025   Barriers to discharge:  Pending Clinical improvement (more likely than not)  Patient currently is NOT MEDICALLY STABLE for discharge from the hospital from a surgery standpoint.      I reviewed nursing notes, last 24 h vitals and pain scores, last 48 h intake and output, last 24 h labs and trends, and last 24 h imaging results.  I have reviewed this patient's available data, including medical history, events of note, test results, etc as part of my evaluation.   A significant portion of that time was spent in counseling. Care during the described time interval was provided by me.  This care required moderate level of medical decision making.  12/18/2023    Subjective: (Chief complaint)  No major events last night.  Walked in hallways.  Tolerating liquids.  No nausea vomiting.  Feels gurgling.  Passing gas.  Not much soreness.  Pain controlled.  Objective:  Vital signs:  Vitals:   12/17/23 1600 12/17/23 2226 12/18/23 0109 12/18/23 0528  BP: (!) 142/75 139/66 126/61 133/73  Pulse: 81 78 72 72  Resp: 17 18 18 18   Temp: (!) 97.5 F (36.4 C) 98.6 F (37 C) 98.3 F (36.8 C) 98.2 F (36.8 C)   TempSrc: Oral Oral Oral Oral  SpO2: 92% 92% 92% 92%  Weight:    115.4 kg  Height:        Last BM Date : 12/16/23  Intake/Output   Yesterday:  02/06 0701 - 02/07 0700 In: 2770 [P.O.:1320; I.V.:1100; IV Piggyback:350] Out: 3550 [Urine:3525; Blood:25] This shift:  Total I/O In: 480 [P.O.:480] Out: 1900 [Urine:1900]  Bowel function:  Flatus: YES  BM:  No  Drain: (No drain)   Physical Exam:  General: Pt awake/alert in no acute distress Eyes: PERRL, normal EOM.  Sclera clear.  No icterus Neuro: CN II-XII intact w/o focal sensory/motor deficits. Lymph: No head/neck/groin lymphadenopathy Psych:  No delerium/psychosis/paranoia.  Oriented x 4 HENT: Normocephalic, Mucus membranes moist.  No thrush Neck: Supple, No tracheal deviation.  No obvious thyromegaly Chest: No pain to chest wall compression.  Good respiratory excursion.  No audible wheezing CV:  Pulses intact.  Regular rhythm.  No major extremity edema MS: Normal AROM mjr joints.  No obvious deformity  Abdomen: Soft.  Nondistended.  Mildly tender at incisions only.  No evidence of peritonitis.  No incarcerated hernias.  Ext:   No deformity.  No mjr edema.  No cyanosis Skin: No petechiae / purpurea.  No major sores.  Warm and dry    Results:   Cultures: No results found for this or any previous visit (from the past 720 hours).  Labs: Results for orders placed or performed during the hospital encounter of 12/17/23 (from the past 48 hours)  Glucose, capillary     Status: Abnormal   Collection Time: 12/17/23  5:26 AM  Result Value Ref Range   Glucose-Capillary 195 (H) 70 - 99 mg/dL    Comment: Glucose reference range applies only to samples taken after fasting for at least 8 hours.   Comment 1 Notify RN    Comment 2 Document in Chart   ABO/Rh     Status: None   Collection Time: 12/17/23  6:26 AM  Result Value Ref Range   ABO/RH(D)      O POS Performed at [facility], [address]  [place], [place] [address]   Glucose, capillary     Status: Abnormal   Collection Time: 12/17/23  8:31 AM  Result Value Ref Range   Glucose-Capillary 160 (H) 70 - 99 mg/dL    Comment: Glucose reference range applies only to samples taken after fasting for at least 8 hours.   Comment 1 Document in Chart    Comment 2 Call MD NNP PA CNM   Glucose, capillary     Status: Abnormal   Collection Time: 12/17/23 10:57 AM  Result Value Ref Range   Glucose-Capillary 261 (H) 70 - 99 mg/dL    Comment: Glucose reference range applies only to samples taken after fasting for at least 8 hours.  Glucose, capillary     Status: Abnormal   Collection Time: 12/17/23 12:38 PM  Result Value Ref Range   Glucose-Capillary 261 (H) 70 - 99 mg/dL    Comment: Glucose reference range applies only to samples taken after fasting  for at least 8 hours.   Comment 1 Notify RN    Comment 2 Document in Chart   Glucose, capillary     Status: Abnormal   Collection Time: 12/17/23  4:33 PM  Result Value Ref Range   Glucose-Capillary 263 (H) 70 - 99 mg/dL    Comment: Glucose reference range applies only to samples taken after fasting for at least 8 hours.  Glucose, capillary     Status: Abnormal   Collection Time: 12/17/23 10:27 PM  Result Value Ref Range   Glucose-Capillary 323 (H) 70 - 99 mg/dL    Comment: Glucose reference range applies only to samples taken after fasting for at least 8 hours.  Basic metabolic panel     Status: Abnormal   Collection Time: 12/18/23  4:53 AM  Result Value Ref Range   Sodium 135 135 - 145 mmol/L   Potassium 5.1 3.5 - 5.1 mmol/L    Comment: HEMOLYSIS AT THIS LEVEL MAY AFFECT RESULT   Chloride 104 98 - 111 mmol/L   CO2 24 22 - 32 mmol/L   Glucose, Bld 251 (H) 70 - 99 mg/dL    Comment: Glucose reference range applies only to samples taken after fasting for at least 8 hours.   BUN 14 8 - 23 mg/dL   Creatinine, Ser 9.18 0.61 - 1.24 mg/dL   Calcium  8.5 (L) 8.9 - 10.3 mg/dL   GFR, Estimated >39  >39 mL/min    Comment: (NOTE) Calculated using the CKD-EPI Creatinine Equation (2021)    Anion gap 7 5 - 15    Comment: Performed at [facility], [address], [place], [place] [address]  CBC     Status: Abnormal   Collection Time: 12/18/23  4:53 AM  Result Value Ref Range   WBC 15.7 (H) 4.0 - 10.5 K/uL   RBC 4.27 4.22 - 5.81 MIL/uL   Hemoglobin 13.5 13.0 - 17.0 g/dL   HCT 60.0 60.9 - 47.9 %   MCV 93.4 80.0 - 100.0 fL   MCH 31.6 26.0 - 34.0 pg   MCHC 33.8 30.0 - 36.0 g/dL   RDW 86.5 88.4 - 84.4 %   Platelets 156 150 - 400 K/uL   nRBC 0.0 0.0 - 0.2 %    Comment: Performed at [facility], [address], [place], [place] [address]  Magnesium     Status: None   Collection Time: 12/18/23  4:53 AM  Result Value Ref Range   Magnesium 1.9 1.7 - 2.4 mg/dL    Comment: Performed at [facility], [address] [place], [place] [address]    Imaging / Studies: No results found.  Medications / Allergies: per chart  Antibiotics: Anti-infectives (From admission, onward)    Start     Dose/Rate Route Frequency Ordered Stop   12/17/23 1800  cefoTEtan  (CEFOTAN ) 2 g in sodium chloride  0.9 % 100 mL IVPB        2 g 200 mL/hr over 30 Minutes Intravenous Every 12 hours 12/17/23 1302 12/17/23 1753   12/17/23 1400  neomycin  (MYCIFRADIN ) tablet 1,000 mg  Status:  Discontinued       Placed in And Linked Group   1,000 mg Oral 3 times per day 12/17/23 0533 12/17/23 0534   12/17/23 1400  metroNIDAZOLE  (FLAGYL ) tablet 1,000 mg  Status:  Discontinued       Placed in And Linked Group   1,000 mg Oral 3 times per day 12/17/23 0533 12/17/23 0534  12/17/23 0654  sodium chloride  0.9 % with cefoTEtan  (CEFOTAN ) ADS Med       Note to Pharmacy: Memory Hoar A: cabinet override      12/17/23 0654 12/17/23 0747   12/17/23 0600  cefoTEtan  (CEFOTAN ) 2 g in sodium chloride  0.9 % 100 mL IVPB        2 g 200 mL/hr over 30 Minutes Intravenous On  call to O.R. 12/17/23 0533 12/17/23 0809         Note: Portions of this report may have been transcribed using voice recognition software. Every effort was made to ensure accuracy; however, inadvertent computerized transcription errors may be present.   Any transcriptional errors that result from this process are unintentional.    Elspeth KYM Schultze, MD, FACS, MASCRS Esophageal, Gastrointestinal & Colorectal Surgery Robotic and Minimally Invasive Surgery  [facility] [address], [address] [place], [place] [address] [phone] Fax [phone] Main  CONTACT INFORMATION: Weekday (9AM-5PM): Call [facility] main office at [phone] Weeknight (5PM-9AM) or Weekend/Holiday: Check EPIC Web Links tab & use AMION (password  [email or web address]) for General Surgery [facility] coverage  Please, DO NOT use SecureChat  (it is not reliable communication to reach operating surgeons & will lead to a delay in care).   Epic staff messaging available for outptient concerns needing 1-2 business day response.      12/18/2023  6:54 AM

## 2023-12-19 LAB — CBC
HCT: 41.7 % (ref 39.0–52.0)
Hemoglobin: 13.9 g/dL (ref 13.0–17.0)
MCH: 31.4 pg (ref 26.0–34.0)
MCHC: 33.3 g/dL (ref 30.0–36.0)
MCV: 94.1 fL (ref 80.0–100.0)
Platelets: 152 10*3/uL (ref 150–400)
RBC: 4.43 MIL/uL (ref 4.22–5.81)
RDW: 13.7 % (ref 11.5–15.5)
WBC: 15.6 10*3/uL — ABNORMAL HIGH (ref 4.0–10.5)
nRBC: 0 % (ref 0.0–0.2)

## 2023-12-19 LAB — POTASSIUM: Potassium: 4.3 mmol/L (ref 3.5–5.1)

## 2023-12-19 LAB — GLUCOSE, CAPILLARY
Glucose-Capillary: 105 mg/dL — ABNORMAL HIGH (ref 70–99)
Glucose-Capillary: 142 mg/dL — ABNORMAL HIGH (ref 70–99)
Glucose-Capillary: 153 mg/dL — ABNORMAL HIGH (ref 70–99)
Glucose-Capillary: 216 mg/dL — ABNORMAL HIGH (ref 70–99)

## 2023-12-19 LAB — CREATININE, SERUM
Creatinine, Ser: 0.83 mg/dL (ref 0.61–1.24)
GFR, Estimated: >60 mL/min (ref >60)

## 2023-12-19 MED ORDER — RIVAROXABAN 10 MG PO TABS
20.0000 mg | ORAL_TABLET | Freq: Every day | ORAL | Status: DC
  Administered 2023-12-19 – 2023-12-20 (×2): 20 mg via ORAL
  Filled 2023-12-19 (×2): qty 2

## 2023-12-19 NOTE — Progress Notes (Signed)
 Subjective No acute events.  Tolerating diet.  Has had 2 reported bowel movements.  Passing flatus.  No nausea or vomiting.  Ambulating well.  Objective: Vital signs in last 24 hours: Temp:  [97.3 F (36.3 C)-98.5 F (36.9 C)] 98.5 F (36.9 C) (02/08 0457) Pulse Rate:  [61-68] 68 (02/08 0457) Resp:  [16-17] 16 (02/08 0457) BP: (116-132)/(61-69) 132/69 (02/08 0457) SpO2:  [93 %-97 %] 97 % (02/08 0457) Weight:  [115.6 kg] 115.6 kg (02/08 0500) Last BM Date : 12/18/23  Intake/Output from previous day: 02/07 0701 - 02/08 0700 In: 715 [P.O.:715] Out: 2750 [Urine:2750] Intake/Output this shift: Total I/O In: 120 [P.O.:120] Out: -   Gen: NAD, comfortable CV: RRR Pulm: Normal work of breathing Abd: Soft, appropriately tender to palpation, no rebound or guarding.  Mild distention.  Incisions are clean/dry without drainage or erythema. Ext: SCDs in place  Lab Results: CBC  Recent Labs    12/18/23 0453 12/19/23 0524  WBC 15.7* 15.6*  HGB 13.5 13.9  HCT 39.9 41.7  PLT 156 152   BMET Recent Labs    12/18/23 0453 12/19/23 0524  NA 135  --   K 5.1 4.3  CL 104  --   CO2 24  --   GLUCOSE 251*  --   BUN 14  --   CREATININE 0.81 0.83  CALCIUM  8.5*  --    PT/INR No results for input(s): LABPROT, INR in the last 72 hours. ABG No results for input(s): PHART, HCO3 in the last 72 hours.  Invalid input(s): PCO2, PO2  Studies/Results:  Anti-infectives: Anti-infectives (From admission, onward)    Start     Dose/Rate Route Frequency Ordered Stop   12/17/23 1800  cefoTEtan  (CEFOTAN ) 2 g in sodium chloride  0.9 % 100 mL IVPB        2 g 200 mL/hr over 30 Minutes Intravenous Every 12 hours 12/17/23 1302 12/17/23 1753   12/17/23 1400  neomycin  (MYCIFRADIN ) tablet 1,000 mg  Status:  Discontinued       Placed in And Linked Group   1,000 mg Oral 3 times per day 12/17/23 0533 12/17/23 0534   12/17/23 1400  metroNIDAZOLE  (FLAGYL ) tablet 1,000 mg  Status:   Discontinued       Placed in And Linked Group   1,000 mg Oral 3 times per day 12/17/23 0533 12/17/23 0534   12/17/23 0654  sodium chloride  0.9 % with cefoTEtan  (CEFOTAN ) ADS Med       Note to Pharmacy: Memory Hoar A: cabinet override      12/17/23 0654 12/17/23 0747   12/17/23 0600  cefoTEtan  (CEFOTAN ) 2 g in sodium chloride  0.9 % 100 mL IVPB        2 g 200 mL/hr over 30 Minutes Intravenous On call to O.R. 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 12/17/2023 12/17/2023   Cancer of descending colon (HCC) 11/26/2023   Atypical atrial flutter (HCC) 08/28/2020   Secondary hypercoagulable state (HCC) 08/27/2020   Cerebrovascular accident (CVA) (HCC)    Paroxysmal atrial fibrillation (HCC)    Angina pectoris (HCC) 07/27/2020   Atrial fibrillation with RVR (HCC) 05/09/2020   Bilateral carpal tunnel syndrome 08/16/2019   Ulnar neuropathy at elbow 08/16/2019   Typical atrial flutter (HCC)    Obesity 05/30/2016   Leukocytosis 05/30/2016   AKI (acute kidney injury) (HCC) 05/30/2016   Diabetes  mellitus, type II (HCC) 01/30/2016   Coronary artery disease involving native coronary artery of native heart with angina pectoris (HCC); Apical LAD 80%, LPDA ~45%. D2 ~60% & non-dominant RCA 80%.    Hypertension    Hyperlipidemia    Sleep apnea    Atrial fibrillation with rapid ventricular response (HCC) 01/29/2016   Elevated troponin    s/p Procedure(s): XI ROBOTIC PARTIAL COLOECTOMY, SPLENIC FLEXTURE MOBILIZATION, BILATERAL TAP BLOCK 12/17/2023  - Doing reasonably well - Ambulate throughout the day - Soft diet today as tolerated - If continues to do well and is tolerating a diet, may be ready for discharge as soon as tomorrow - check CBC tomorrow AM as we are restarting Xarelto  with supper today -PPx SCDs, Lovenox  --> D/C Lovenox , restart home Xarelto    LOS: 2 days    Lonni Pizza, MD [facility], [facility]

## 2023-12-19 NOTE — Progress Notes (Signed)
 Mobility Specialist - Progress Note   12/19/23 0850  Mobility  Activity Ambulated independently in hallway  Level of Assistance Independent  Assistive Device None  Distance Ambulated (ft) 500 ft  Activity Response Tolerated well  Mobility Referral Yes  Mobility visit 1 Mobility  Mobility Specialist Start Time (ACUTE ONLY) [id number]  Mobility Specialist Stop Time (ACUTE ONLY) 0850  Mobility Specialist Time Calculation (min) (ACUTE ONLY) 12 min   Pt received in recliner and agreeable to mobility. No complaints during session. Pt to recliner after session with all needs met.    [facility]

## 2023-12-19 NOTE — Plan of Care (Signed)
  Problem: Bowel/Gastric: Goal: Gastrointestinal status for postoperative course will improve Outcome: Progressing   

## 2023-12-19 NOTE — Plan of Care (Signed)
   Problem: Education: Goal: Understanding of discharge needs will improve Outcome: Progressing

## 2023-12-20 LAB — CBC WITH DIFFERENTIAL/PLATELET
Abs Immature Granulocytes: 0.05 10*3/uL (ref 0.00–0.07)
Basophils Absolute: 0.1 10*3/uL (ref 0.0–0.1)
Basophils Relative: 1 %
Eosinophils Absolute: 0.2 10*3/uL (ref 0.0–0.5)
Eosinophils Relative: 1 %
HCT: 42.2 % (ref 39.0–52.0)
Hemoglobin: 14 g/dL (ref 13.0–17.0)
Immature Granulocytes: 0 %
Lymphocytes Relative: 42 %
Lymphs Abs: 5.9 10*3/uL — ABNORMAL HIGH (ref 0.7–4.0)
MCH: 31.1 pg (ref 26.0–34.0)
MCHC: 33.2 g/dL (ref 30.0–36.0)
MCV: 93.8 fL (ref 80.0–100.0)
Monocytes Absolute: 1.3 10*3/uL — ABNORMAL HIGH (ref 0.1–1.0)
Monocytes Relative: 9 %
Neutro Abs: 6.5 10*3/uL (ref 1.7–7.7)
Neutrophils Relative %: 47 %
Platelets: 158 10*3/uL (ref 150–400)
RBC: 4.5 MIL/uL (ref 4.22–5.81)
RDW: 13.7 % (ref 11.5–15.5)
WBC: 14 10*3/uL — ABNORMAL HIGH (ref 4.0–10.5)
nRBC: 0 % (ref 0.0–0.2)

## 2023-12-20 LAB — GLUCOSE, CAPILLARY
Glucose-Capillary: 69 mg/dL — ABNORMAL LOW (ref 70–99)
Glucose-Capillary: 90 mg/dL (ref 70–99)
Glucose-Capillary: 131 mg/dL — ABNORMAL HIGH (ref 70–99)
Glucose-Capillary: 114 mg/dL — ABNORMAL HIGH (ref 70–99)
Glucose-Capillary: 230 mg/dL — ABNORMAL HIGH (ref 70–99)

## 2023-12-20 NOTE — Progress Notes (Signed)
 Subjective No acute events.  Tolerating diet.  Continues to have good bowel function.  He does report that his stools appeared black/tarry.  We did restart his Xarelto  yesterday afternoon/evening.  No nausea or vomiting.  Ambulating well.  Objective: Vital signs in last 24 hours: Temp:  [97.7 F (36.5 C)-98.5 F (36.9 C)] 98.4 F (36.9 C) (02/09 0453) Pulse Rate:  [60-71] 71 (02/09 0453) Resp:  [15-18] 15 (02/09 0453) BP: (134-154)/(70-78) 139/70 (02/09 0453) SpO2:  [96 %-97 %] 96 % (02/09 0453) Weight:  [114.5 kg] 114.5 kg (02/09 0500) Last BM Date : 12/19/23  Intake/Output from previous day: 02/08 0701 - 02/09 0700 In: 1775 [P.O.:1775] Out: 600 [Urine:600] Intake/Output this shift: No intake/output data recorded.  Gen: NAD, comfortable CV: RRR Pulm: Normal work of breathing Abd: Soft, appropriately tender to palpation, no rebound or guarding.  Mild distention.  Incisions are clean/dry without drainage or erythema. Ext: SCDs in place  Lab Results: CBC  Recent Labs    12/19/23 0524 12/20/23 0518  WBC 15.6* 14.0*  HGB 13.9 14.0  HCT 41.7 42.2  PLT 152 158   BMET Recent Labs    12/18/23 0453 12/19/23 0524  NA 135  --   K 5.1 4.3  CL 104  --   CO2 24  --   GLUCOSE 251*  --   BUN 14  --   CREATININE 0.81 0.83  CALCIUM  8.5*  --    PT/INR No results for input(s): LABPROT, INR in the last 72 hours. ABG No results for input(s): PHART, HCO3 in the last 72 hours.  Invalid input(s): PCO2, PO2  Studies/Results:  Anti-infectives: Anti-infectives (From admission, onward)    Start     Dose/Rate Route Frequency Ordered Stop   12/17/23 1800  cefoTEtan  (CEFOTAN ) 2 g in sodium chloride  0.9 % 100 mL IVPB        2 g 200 mL/hr over 30 Minutes Intravenous Every 12 hours 12/17/23 1302 12/17/23 1753   12/17/23 1400  neomycin  (MYCIFRADIN ) tablet 1,000 mg  Status:  Discontinued       Placed in And Linked Group   1,000 mg Oral 3 times per day 12/17/23 0533  12/17/23 0534   12/17/23 1400  metroNIDAZOLE  (FLAGYL ) tablet 1,000 mg  Status:  Discontinued       Placed in And Linked Group   1,000 mg Oral 3 times per day 12/17/23 0533 12/17/23 0534   12/17/23 0654  sodium chloride  0.9 % with cefoTEtan  (CEFOTAN ) ADS Med       Note to Pharmacy: Memory Hoar A: cabinet override      12/17/23 0654 12/17/23 0747   12/17/23 0600  cefoTEtan  (CEFOTAN ) 2 g in sodium chloride  0.9 % 100 mL IVPB        2 g 200 mL/hr over 30 Minutes Intravenous On call to O.R. 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 12/17/2023 12/17/2023   Cancer of descending colon (HCC) 11/26/2023   Atypical atrial flutter (HCC) 08/28/2020   Secondary hypercoagulable state (HCC) 08/27/2020   Cerebrovascular accident (CVA) (HCC)    Paroxysmal atrial fibrillation (HCC)    Angina pectoris (HCC) 07/27/2020   Atrial fibrillation with RVR (HCC) 05/09/2020   Bilateral carpal tunnel syndrome 08/16/2019   Ulnar neuropathy at elbow 08/16/2019   Typical atrial flutter (HCC)    Obesity 05/30/2016   Leukocytosis 05/30/2016  AKI (acute kidney injury) (HCC) 05/30/2016   Diabetes mellitus, type II (HCC) 01/30/2016   Coronary artery disease involving native coronary artery of native heart with angina pectoris (HCC); Apical LAD 80%, LPDA ~45%. D2 ~60% & non-dominant RCA 80%.    Hypertension    Hyperlipidemia    Sleep apnea    Atrial fibrillation with rapid ventricular response (HCC) 01/29/2016   Elevated troponin    s/p Procedure(s): XI ROBOTIC PARTIAL COLOECTOMY, SPLENIC FLEXTURE MOBILIZATION, BILATERAL TAP BLOCK 12/17/2023  - Doing reasonably well - Ambulate throughout the day - Soft diet today as tolerated - Unclear of significance of dark stool - likely ok but given we just started Xarelto  yesterday evening, will monitor today - likely home tomorrow. Repeat CBC ordered for  AM -PPx SCDs, Lovenox  D/C'd now on Xarelto    LOS: 3 days   Lonni Pizza, MD [facility]

## 2023-12-20 NOTE — Plan of Care (Signed)
   Problem: Education: Goal: Understanding of discharge needs will improve Outcome: Progressing

## 2023-12-20 NOTE — Progress Notes (Signed)
 Hypoglycemic Event  CBG: 69  Treatment: 8 oz. OJ and pt eating breakfast  Symptoms: none  Follow-up CBG: Time: 0808 CBG Result: 90  Possible Reasons for Event: inadequate PO intake    Lucas [name]

## 2023-12-21 ENCOUNTER — Other Ambulatory Visit (HOSPITAL_COMMUNITY): Payer: Self-pay

## 2023-12-21 LAB — CBC WITH DIFFERENTIAL/PLATELET
Abs Immature Granulocytes: 0.03 10*3/uL (ref 0.00–0.07)
Basophils Absolute: 0.1 10*3/uL (ref 0.0–0.1)
Basophils Relative: 1 %
Eosinophils Absolute: 0.1 10*3/uL (ref 0.0–0.5)
Eosinophils Relative: 1 %
HCT: 38.4 % — ABNORMAL LOW (ref 39.0–52.0)
Hemoglobin: 12.8 g/dL — ABNORMAL LOW (ref 13.0–17.0)
Immature Granulocytes: 0 %
Lymphocytes Relative: 32 %
Lymphs Abs: 3.1 10*3/uL (ref 0.7–4.0)
MCH: 30.8 pg (ref 26.0–34.0)
MCHC: 33.3 g/dL (ref 30.0–36.0)
MCV: 92.5 fL (ref 80.0–100.0)
Monocytes Absolute: 1 10*3/uL (ref 0.1–1.0)
Monocytes Relative: 10 %
Neutro Abs: 5.4 10*3/uL (ref 1.7–7.7)
Neutrophils Relative %: 56 %
Platelets: 141 10*3/uL — ABNORMAL LOW (ref 150–400)
RBC: 4.15 MIL/uL — ABNORMAL LOW (ref 4.22–5.81)
RDW: 13.4 % (ref 11.5–15.5)
WBC: 9.7 10*3/uL (ref 4.0–10.5)
nRBC: 0 % (ref 0.0–0.2)

## 2023-12-21 LAB — GLUCOSE, CAPILLARY: Glucose-Capillary: 71 mg/dL (ref 70–99)

## 2023-12-21 MED ORDER — HYDROCODONE-ACETAMINOPHEN 5-325 MG PO TABS
1.0000 | ORAL_TABLET | Freq: Four times a day (QID) | ORAL | 0 refills | Status: AC | PRN
  Filled 2023-12-21: qty 30, 8d supply, fill #0

## 2023-12-21 NOTE — Discharge Summary (Signed)
 Physician Discharge Summary    Lucas [name]. MRN: [id number] DOB/AGE: 11-12-1955 = 68 y.o.  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 D2 ~60% & non-dominant RCA 80%.   Hypertension   Hyperlipidemia   Sleep apnea   Diabetes mellitus, type II (HCC)   Obesity   4 Days Post-Op  12/17/2023  POST-OPERATIVE DIAGNOSIS:  DISTAL TRANSVERSE COLON CANCER   PROCEDURE:   -ROBOTIC RESECTION OF SPLENIC FLEXURE OF COLON WITH ANASTOMOSIS -MOBILIZATION OF SPLENIC FLEXURE OF COLON -INTRAOPERATIVE ASSESSMENT OF TISSUE VASCULAR PERFUSION USING ICG (indocyanine green ) IMMUNOFLUORESCENCE -TRANSVERSUS ABDOMINIS PLANE (TAP) BLOCK - BILATERAL   SURGEON:  Eddye Goodie, MD   OR FINDINGS:    Patient had tattooing and very distal transverse colon at the splenic flexure.  Has stellate shaped about hard fibrotic ulcer at site of polypectomy where cancer was found.  No obvious metastatic disease on visceral parietal peritoneum or liver.   It is an isoperistaltic colocolonic (mid transverse colon to distal descending colon ) anastomosis that rests in the left periumbilical region.   CASE DATA:   Type of patient?: Elective [facility] Private Case Status of Case? Elective Scheduled Infection Present At Time Of Surgery (PATOS)?  NO    Consults: Case Management /  Social Work and Anesthesia  Hospital Course:   The patient underwent the surgery above.  Postoperatively, the patient gradually mobilized and advanced to a solid diet.  Pain and other symptoms were treated aggressively.    By the time of discharge, the patient was walking well the hallways, eating food, having flatus.  Patient had his oral anticoagulation restarted.  He did had a few dark stools but no hypotension or drop in hemoglobin.  Remained stable.  Pain was well-controlled on an oral medications.  Based on meeting discharge criteria and continuing to recover, I felt it was safe for the patient to be discharged from the hospital to further recover with close followup. Postoperative recommendations were discussed in detail.  They are written as well.  Discharged Condition: good  Discharge Exam: Blood pressure 138/65, pulse 74, temperature 98.4 F (36.9 C), temperature source Oral, resp. rate 18, height 5\' 10"  (1.778 m), weight 115.4 kg, SpO2 97%.  General: Pt awake/alert/oriented x4 in No acute distress.  Sitting up.  Inquisitive.  Calm.  Not toxic.  Not sickly. Eyes: PERRL, normal EOM.  Sclera clear.  No icterus Neuro: CN II-XII intact w/o focal sensory/motor deficits. Lymph: No head/neck/groin lymphadenopathy Psych:  No delerium/psychosis/paranoia HENT: Normocephalic, Mucus membranes moist.  No thrush Neck: Supple, No tracheal deviation Chest: No chest wall pain w good excursion CV:  Pulses intact.  Regular rhythm MS: Normal AROM mjr joints.  No obvious deformity Abdomen: Soft.  Nondistended.  Nontender.  Minimal ecchymosis at Pfannenstiel incision.  No evidence of peritonitis.  No incarcerated hernias. Ext:  SCDs BLE.  No mjr edema.  No cyanosis Skin: No  petechiae / purpura   Disposition:    Follow-up Information     Candyce Champagne, MD Follow up on 01/13/2024.   Specialties: General Surgery, Colon and Rectal Surgery Why: To follow up after your operation Contact  information: [address] [place] [address] [phone]                 Discharge disposition: 01-Home or Self Care       Discharge Instructions     Call MD for:   Complete by: As directed    FEVER > 101.5 F  (temperatures < 101.5 F are not significant)   Call MD for:  extreme fatigue   Complete by: As directed    Call MD for:  persistant dizziness or light-headedness   Complete by: As directed    Call MD for:  persistant nausea and vomiting   Complete by: As directed    Call MD for:  redness, tenderness, or signs of infection (pain, swelling, redness, odor or green/yellow discharge around incision site)   Complete by: As directed    Call MD for:  severe uncontrolled pain   Complete by: As directed    Diet - low sodium heart healthy   Complete by: As directed    Start with a bland diet such as soups, liquids, starchy foods, low fat foods, etc. the first few days at home. Gradually advance to a solid, low-fat, high fiber diet by the end of the first week at home.   Add a fiber supplement to your diet (Metamucil, etc) If you feel full, bloated, or constipated, stay on a full liquid or pureed/blenderized diet for a few days until you feel better and are no longer constipated.   Discharge instructions   Complete by: As directed    See Discharge Instructions If you are not getting better after two weeks or are noticing you are getting worse, contact our office [phone] for further advice.  We may need to adjust your medications, re-evaluate you in the office, send you to the emergency room, or see what other things we can do to help. The clinic staff is available to answer your questions during regular business hours (8:30am-5pm).  Please don't hesitate to call and ask to speak to one of our nurses for clinical concerns.    A surgeon from [facility] is always on call at the hospitals 24 hours/day If you have a medical emergency, go to  the nearest emergency room or call 911.   Discharge wound care:   Complete by: As directed    It is good for closed incisions and even open wounds to be washed every day.  Shower every day.  Short baths are fine.  Wash the incisions and wounds clean with soap & water.    You may leave closed incisions open to air if it is dry.   You may cover the incision with clean gauze & replace it after your daily shower for comfort.   Driving Restrictions   Complete by: As directed    You may drive when: - you are no longer taking narcotic prescription pain medication - you can comfortably wear a seatbelt - you can safely make sudden turns/stops without pain.   Increase activity slowly   Complete by: As directed    Start light daily activities --- self-care, walking, climbing stairs- beginning the day after surgery.  Gradually increase activities as tolerated.  Control your pain to be active.  Stop when you are tired.  Ideally, walk several times a day, eventually an hour a day.   Most people are back to most day-to-day activities in a few weeks.  It takes 4-6 weeks to get back to unrestricted, intense activity. If you can walk 30 minutes without difficulty, it is safe to try more intense activity such as jogging, treadmill, bicycling, low-impact aerobics, swimming, etc. Save the most intensive and strenuous activity for last (Usually 4-8 weeks after surgery) such as sit-ups, heavy lifting, contact sports, etc.  Refrain from any intense heavy lifting or straining until you are off narcotics for pain control.  You will have off days, but things should improve week-by-week. DO NOT PUSH THROUGH PAIN.  Let pain be your guide: If it hurts to do something, don't do it.   Lifting restrictions   Complete by: As directed    If you can walk 30 minutes without difficulty, it is safe to try more intense activity such as jogging, treadmill, bicycling, low-impact aerobics, swimming, etc. Save the most intensive and  strenuous activity for last (Usually 4-8 weeks after surgery) such as sit-ups, heavy lifting, contact sports, etc.   Refrain from any intense heavy lifting or straining until you are off narcotics for pain control.  You will have off days, but things should improve week-by-week. DO NOT PUSH THROUGH PAIN.  Let pain be your guide: If it hurts to do something, don't do it.  Pain is your body warning you to avoid that activity for another week until the pain goes down.   May shower / Bathe   Complete by: As directed    May walk up steps   Complete by: As directed    Sexual Activity Restrictions   Complete by: As directed    You may have sexual intercourse when it is comfortable. If it hurts to do something, stop.       Allergies as of 12/21/2023       Reactions   Bee Venom Swelling   Codeine Nausea Only        Medication List     TAKE these medications    atorvastatin  80 MG tablet Commonly known as: LIPITOR  TAKE 1 TABLET BY MOUTH DAILY AT 6 PM.   diltiazem  240 MG 24 hr capsule Commonly known as: CARDIZEM  CD TAKE 1 CAPSULE (240 MG TOTAL) BY MOUTH EVERY EVENING.   furosemide  20 MG tablet Commonly known as: LASIX  Take 1 tablet (20 mg total) by mouth daily as needed for fluid (swelling).   gabapentin  300 MG capsule Commonly known as: NEURONTIN  Take 600 mg by mouth 2 (two) times daily.   glimepiride  4 MG tablet Commonly known as: AMARYL  Take 4 mg by mouth 2 (two) times daily.   HYDROcodone -acetaminophen  5-325 MG tablet Commonly known as: NORCO/VICODIN Take 1 tablet by mouth every 6 (six) hours as needed for moderate pain (pain score 4-6). What changed: when to take this   isosorbide  mononitrate 120 MG 24 hr tablet Commonly known as: IMDUR  TAKE 1 TABLET BY MOUTH EVERY DAY   metoprolol  succinate 100 MG 24 hr tablet Commonly known as: TOPROL -XL TAKE 1 AND 1/2 TABLETS BY MOUTH DAILY WITH OR IMMEDIATELY FOLLOWING A MEAL What changed: See the new instructions.    nitroGLYCERIN  0.4 MG SL tablet Commonly known as: NITROSTAT  Place 1 tablet (0.4 mg total) under the tongue every 5 (five) minutes as needed for chest pain.   Ozempic (2 MG/DOSE) 8 MG/3ML Sopn Generic drug: Semaglutide (2 MG/DOSE)  Inject 2 mg into the skin once a week.   pantoprazole  40 MG tablet Commonly known as: PROTONIX  Take 40 mg by mouth daily before breakfast.   Synjardy  XR 12.03-999 MG Tb24 Generic drug: Empagliflozin -metFORMIN  HCl ER Take 1 tablet by mouth in the morning and at bedtime.   Xarelto  20 MG Tabs tablet Generic drug: rivaroxaban  TAKE 1 TABLET BY MOUTH DAILY WITH SUPPER               Discharge Care Instructions  (From admission, onward)           Start     Ordered   12/21/23 0000  Discharge wound care:       Comments: It is good for closed incisions and even open wounds to be washed every day.  Shower every day.  Short baths are fine.  Wash the incisions and wounds clean with soap & water.    You may leave closed incisions open to air if it is dry.   You may cover the incision with clean gauze & replace it after your daily shower for comfort.   12/21/23 0751            Significant Diagnostic Studies:  Results for orders placed or performed during the hospital encounter of 12/17/23 (from the past 72 hours)  Glucose, capillary     Status: Abnormal   Collection Time: 12/18/23 11:19 AM  Result Value Ref Range   Glucose-Capillary 226 (H) 70 - 99 mg/dL    Comment: Glucose reference range applies only to samples taken after fasting for at least 8 hours.  Glucose, capillary     Status: Abnormal   Collection Time: 12/18/23  4:55 PM  Result Value Ref Range   Glucose-Capillary 153 (H) 70 - 99 mg/dL    Comment: Glucose reference range applies only to samples taken after fasting for at least 8 hours.  Glucose, capillary     Status: Abnormal   Collection Time: 12/18/23  8:21 PM  Result Value Ref Range   Glucose-Capillary 205 (H) 70 - 99 mg/dL     Comment: Glucose reference range applies only to samples taken after fasting for at least 8 hours.  CBC     Status: Abnormal   Collection Time: 12/19/23  5:24 AM  Result Value Ref Range   WBC 15.6 (H) 4.0 - 10.5 K/uL   RBC 4.43 4.22 - 5.81 MIL/uL   Hemoglobin 13.9 13.0 - 17.0 g/dL   HCT 16.1 09.6 - 04.5 %   MCV 94.1 80.0 - 100.0 fL   MCH 31.4 26.0 - 34.0 pg   MCHC 33.3 30.0 - 36.0 g/dL   RDW 40.9 81.1 - 91.4 %   Platelets 152 150 - 400 K/uL   nRBC 0.0 0.0 - 0.2 %    Comment: Performed at [facility], [address], [place], [place] [address]  Potassium     Status: None   Collection Time: 12/19/23  5:24 AM  Result Value Ref Range   Potassium 4.3 3.5 - 5.1 mmol/L    Comment: Performed at [facility], [address], [place], [place] [address]  Creatinine, serum     Status: None   Collection Time: 12/19/23  5:24 AM  Result Value Ref Range   Creatinine, Ser 0.83 0.61 - 1.24 mg/dL   GFR, Estimated >86 >57 mL/min    Comment: (NOTE) Calculated using the CKD-EPI Creatinine Equation (2021) Performed at [facility], [address]  [place], [place] [address]   Glucose, capillary     Status: Abnormal   Collection Time: 12/19/23  7:19 AM  Result Value Ref Range   Glucose-Capillary 105 (H) 70 - 99 mg/dL    Comment: Glucose reference range applies only to samples taken after fasting for at least 8 hours.  Glucose, capillary     Status: Abnormal   Collection Time: 12/19/23 11:51 AM  Result Value Ref Range   Glucose-Capillary 142 (H) 70 - 99 mg/dL    Comment: Glucose reference range applies only to samples taken after fasting for at least 8 hours.  Glucose, capillary     Status: Abnormal   Collection Time: 12/19/23  4:46 PM  Result Value Ref Range   Glucose-Capillary 153 (H) 70 - 99 mg/dL    Comment: Glucose reference range applies only to samples taken after fasting for at least 8 hours.  Glucose, capillary     Status:  Abnormal   Collection Time: 12/19/23  8:09 PM  Result Value Ref Range   Glucose-Capillary 216 (H) 70 - 99 mg/dL    Comment: Glucose reference range applies only to samples taken after fasting for at least 8 hours.  CBC with Differential/Platelet     Status: Abnormal   Collection Time: 12/20/23  5:18 AM  Result Value Ref Range   WBC 14.0 (H) 4.0 - 10.5 K/uL   RBC 4.50 4.22 - 5.81 MIL/uL   Hemoglobin 14.0 13.0 - 17.0 g/dL   HCT 65.7 84.6 - 96.2 %   MCV 93.8 80.0 - 100.0 fL   MCH 31.1 26.0 - 34.0 pg   MCHC 33.2 30.0 - 36.0 g/dL   RDW 95.2 84.1 - 32.4 %   Platelets 158 150 - 400 K/uL   nRBC 0.0 0.0 - 0.2 %   Neutrophils Relative % 47 %   Neutro Abs 6.5 1.7 - 7.7 K/uL   Lymphocytes Relative 42 %   Lymphs Abs 5.9 (H) 0.7 - 4.0 K/uL   Monocytes Relative 9 %   Monocytes Absolute 1.3 (H) 0.1 - 1.0 K/uL   Eosinophils Relative 1 %   Eosinophils Absolute 0.2 0.0 - 0.5 K/uL   Basophils Relative 1 %   Basophils Absolute 0.1 0.0 - 0.1 K/uL   Immature Granulocytes 0 %   Abs Immature Granulocytes 0.05 0.00 - 0.07 K/uL    Comment: Performed at [facility], [address], [place], [place] [address]  Glucose, capillary     Status: Abnormal   Collection Time: 12/20/23  7:52 AM  Result Value Ref Range   Glucose-Capillary 69 (L) 70 - 99 mg/dL    Comment: Glucose reference range applies only to samples taken after fasting for at least 8 hours.  Glucose, capillary     Status: None   Collection Time: 12/20/23  8:08 AM  Result Value Ref Range   Glucose-Capillary 90 70 - 99 mg/dL    Comment: Glucose reference range applies only to samples taken after fasting for at least 8 hours.  Glucose, capillary     Status: Abnormal   Collection Time: 12/20/23 11:39 AM  Result Value Ref Range   Glucose-Capillary 131 (H) 70 - 99 mg/dL    Comment: Glucose reference range applies only to samples taken after fasting for at least 8 hours.  Glucose, capillary     Status: Abnormal   Collection  Time: 12/20/23  4:23 PM  Result Value Ref Range   Glucose-Capillary 114 (H) 70 - 99 mg/dL  Comment: Glucose reference range applies only to samples taken after fasting for at least 8 hours.  Glucose, capillary     Status: Abnormal   Collection Time: 12/20/23  9:43 PM  Result Value Ref Range   Glucose-Capillary 230 (H) 70 - 99 mg/dL    Comment: Glucose reference range applies only to samples taken after fasting for at least 8 hours.  CBC with Differential/Platelet     Status: Abnormal   Collection Time: 12/21/23  4:44 AM  Result Value Ref Range   WBC 9.7 4.0 - 10.5 K/uL   RBC 4.15 (L) 4.22 - 5.81 MIL/uL   Hemoglobin 12.8 (L) 13.0 - 17.0 g/dL   HCT 56.3 (L) 87.5 - 64.3 %   MCV 92.5 80.0 - 100.0 fL   MCH 30.8 26.0 - 34.0 pg   MCHC 33.3 30.0 - 36.0 g/dL   RDW 32.9 51.8 - 84.1 %   Platelets 141 (L) 150 - 400 K/uL   nRBC 0.0 0.0 - 0.2 %   Neutrophils Relative % 56 %   Neutro Abs 5.4 1.7 - 7.7 K/uL   Lymphocytes Relative 32 %   Lymphs Abs 3.1 0.7 - 4.0 K/uL   Monocytes Relative 10 %   Monocytes Absolute 1.0 0.1 - 1.0 K/uL   Eosinophils Relative 1 %   Eosinophils Absolute 0.1 0.0 - 0.5 K/uL   Basophils Relative 1 %   Basophils Absolute 0.1 0.0 - 0.1 K/uL   Immature Granulocytes 0 %   Abs Immature Granulocytes 0.03 0.00 - 0.07 K/uL    Comment: Performed at [facility], [address], [place], [place] [address]  Glucose, capillary     Status: None   Collection Time: 12/21/23  7:36 AM  Result Value Ref Range   Glucose-Capillary 71 70 - 99 mg/dL    Comment: Glucose reference range applies only to samples taken after fasting for at least 8 hours.    No results found.  Past Medical History:  Diagnosis Date   ADHD (attention deficit hyperactivity disorder)    Arthritis    Bilateral carpal tunnel syndrome 08/16/2019   CAD (coronary artery disease)    a. LHC on 01/29/16 which revealed significant apical LAD stenosis, best treated medically. There was moderate  disease in the D2 and mid nondominant RCA.Aaron Aas No PCI performed  b. 03/2018: repeat cath showing regression of his LAD stenosis now being at 50% with 60% mid RCA stenosis, 45% ostial LPDA stenosis, and 45% ostial second diagonal stenosis.   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   Bilateral    Past Surgical History:  Procedure Laterality Date   ANKLE SURGERY Left 11/10/2010   tendon repair   ATRIAL FIBRILLATION ABLATION [name] 08/24/2020   Procedure: ATRIAL FIBRILLATION ABLATION;  Surgeon: Lei Pump, MD;  Location: [facility] INVASIVE CV LAB;  Service: Cardiovascular;  Laterality: [name];   CARDIAC CATHETERIZATION  [name] 01/29/2016   Procedure: Left Heart Cath and Coronary Angiography;  Surgeon: Arty Binning, MD;  Location: [facility] INVASIVE CV LAB;  Service: Cardiovascular;  Laterality: [name];   CARPAL TUNNEL RELEASE Right 09/06/2019   Procedure: CARPAL TUNNEL RELEASE;  Surgeon: Lyanne Sample, MD;  Location: [facility];  Service: Orthopedics;  Laterality: Right;   CARPAL TUNNEL RELEASE Left 10/11/2019   Procedure: LEFT CARPAL TUNNEL RELEASE;  Surgeon: Lyanne Sample, MD;  Location: [facility];  Service: Orthopedics;  Laterality: Left;  IV REGIONAL FOREARM BLOCK   CATARACT EXTRACTION W/PHACO  11/11/2012   Procedure: CATARACT EXTRACTION PHACO AND INTRAOCULAR LENS PLACEMENT (IOC);  Surgeon: Anner Kill, MD;  Location: [facility];  Service: Ophthalmology;  Laterality: Right;  CDE:  5.56   CATARACT EXTRACTION W/PHACO Left 12/15/2013   Procedure: CATARACT EXTRACTION  PHACO AND INTRAOCULAR LENS PLACEMENT (IOC);  Surgeon: Anner Kill, MD;  Location: [facility];  Service: Ophthalmology;  Laterality: Left;  CDE:13.36   CHOLECYSTECTOMY     CHONDROPLASTY Right 05/28/2017   Procedure: RIGHT KNEE CHONDROPLASTY;  Surgeon: Ferd Householder, MD;  Location: [facility];  Service: Orthopedics;  Laterality: Right;   COLONOSCOPY     CYSTOSCOPY W/ URETERAL STENT PLACEMENT     right   ELECTROPHYSIOLOGIC STUDY [name] 06/02/2016   Procedure: A-Flutter Ablation;  Surgeon: Will Cortland Ding, MD;  Location: [facility] INVASIVE CV LAB;  Service: Cardiovascular;  Laterality: [name];   KNEE ARTHROSCOPY WITH MEDIAL MENISECTOMY Right 05/28/2017   Procedure: KNEE ARTHROSCOPY WITH MEDIAL MENISECTOMY WITH EXTENSIVE SYNOVECTOMY;  Surgeon: Ferd Householder, MD;  Location:  SURGERY CENTER;  Service: Orthopedics;  Laterality: Right;   LEFT HEART CATH AND CORONARY ANGIOGRAPHY [name] 03/30/2018   Procedure: LEFT HEART CATH AND CORONARY ANGIOGRAPHY;  Surgeon: Arleen Lacer, MD;  Location: [facility] INVASIVE CV LAB;  Service: Cardiovascular;  Laterality: [name];   LEFT HEART CATH AND CORONARY ANGIOGRAPHY [name] 07/27/2020   Procedure: LEFT HEART CATH AND CORONARY ANGIOGRAPHY;  Surgeon: Arnoldo Lapping, MD;  Location: [facility] INVASIVE CV LAB;  Service: Cardiovascular;  Laterality: [name];   left knee sugery Left    Arthroscopy   NASAL SINUS SURGERY     POLYPECTOMY     SHOULDER ARTHROSCOPY WITH BICEPS TENDON REPAIR Right    TEE WITHOUT CARDIOVERSION [name] 08/23/2020   Procedure: TRANSESOPHAGEAL ECHOCARDIOGRAM (TEE);  Surgeon: Alroy Aspen Lela Purple, MD;  Location: [facility] ENDOSCOPY;  Service: Cardiovascular;  Laterality: [name];   TONSILLECTOMY      Social History   Socioeconomic History   Marital status: Married    Spouse name: Not on file   Number of children: Not on file   Years of education: 12   Highest education level: Not on file  Occupational History   Not on file  Tobacco Use   Smoking status: Never    Smokeless tobacco: Never  Vaping Use   Vaping status: Never Used  Substance and Sexual Activity   Alcohol use: Yes    Alcohol/week: 0.0 standard drinks of alcohol    Comment: rare occasion   Drug use: No   Sexual activity: Yes    Birth control/protection: None  Other Topics Concern   Not on file  Social History Narrative   Right handed   Caffeine~ 1-2 cups per day   Lives at home with wife Corbin Dess    Social Drivers of Health   Financial Resource Strain: Not on file  Food Insecurity: No Food Insecurity (12/17/2023)   Hunger Vital Sign  Worried About Programme researcher, broadcasting/film/video in the Last Year: Never true    Ran Out of Food in the Last Year: Never true  Transportation Needs: No Transportation Needs (12/17/2023)   PRAPARE - Administrator, Civil Service (Medical): No    Lack of Transportation (Non-Medical): No  Physical Activity: Not on file  Stress: Not on file  Social Connections: Moderately Integrated (12/17/2023)   Social Connection and Isolation Panel [NHANES]    Frequency of Communication with Friends and Family: More than three times a week    Frequency of Social Gatherings with Friends and Family: Twice a week    Attends Religious Services: More than 4 times per year    Active Member of [employer] or Organizations: No    Attends Banker Meetings: Patient declined    Marital Status: Married  Catering manager Violence: Not At Risk (12/17/2023)   Humiliation, Afraid, Rape, and Kick questionnaire    Fear of Current or Ex-Partner: No    Emotionally Abused: No    Physically Abused: No    Sexually Abused: No    Family History  Problem Relation Age of Onset   Heart murmur Mother    Aneurysm Mother    Hypertension Mother    Cirrhosis Father    Heart attack Sister    Diabetes Sister    Aneurysm Maternal Grandmother    Stroke Maternal Grandmother    Heart attack Paternal Grandmother    Stroke Sister    Colon cancer Neg Hx    Rectal cancer Neg Hx    Stomach  cancer Neg Hx     Current Facility-Administered Medications  Medication Dose Route Frequency Provider Last Rate Last Admin   0.9 %  sodium chloride  infusion  250 mL Intravenous PRN Candyce Champagne, MD       acetaminophen  (TYLENOL ) tablet 1,000 mg  1,000 mg Oral Q6H Yaniyah Koors, MD   1,000 mg at 12/21/23 0636   alum & mag hydroxide-simeth (MAALOX/MYLANTA) 200-200-20 MG/5ML suspension 30 mL  30 mL Oral Q6H PRN Candyce Champagne, MD       atorvastatin  (LIPITOR ) tablet 80 mg  80 mg Oral Daily Candyce Champagne, MD   80 mg at 12/20/23 4098   diltiazem  (CARDIZEM  CD) 24 hr capsule 240 mg  240 mg Oral Daily Candyce Champagne, MD   240 mg at 12/20/23 0804   diphenhydrAMINE  (BENADRYL ) 12.5 MG/5ML elixir 12.5 mg  12.5 mg Oral Q6H PRN Candyce Champagne, MD       Or   diphenhydrAMINE  (BENADRYL ) injection 12.5 mg  12.5 mg Intravenous Q6H PRN Candyce Champagne, MD       empagliflozin  (JARDIANCE ) tablet 25 mg  25 mg Oral Daily Rubie Corona, RPH   25 mg at 12/20/23 0804   And   metFORMIN  (GLUCOPHAGE -XR) 24 hr tablet 1,000 mg  1,000 mg Oral BID WC Arlyne Bering T, RPH   1,000 mg at 12/20/23 1632   enalaprilat  (VASOTEC) injection 0.625-1.25 mg  0.625-1.25 mg Intravenous Q6H PRN Candyce Champagne, MD       feeding supplement (ENSURE SURGERY) liquid 237 mL  237 mL Oral BID BM Candyce Champagne, MD   237 mL at 12/20/23 1426   gabapentin  (NEURONTIN ) capsule 600 mg  600 mg Oral BID Candyce Champagne, MD   600 mg at 12/20/23 2156   glimepiride  (AMARYL ) tablet 4 mg  4 mg Oral BID WC Candyce Champagne, MD   4 mg at 12/20/23 1632   hydrALAZINE  (APRESOLINE ) injection  10 mg  10 mg Intravenous Q2H PRN Candyce Champagne, MD       HYDROmorphone  (DILAUDID ) injection 0.5-2 mg  0.5-2 mg Intravenous Q4H PRN Candyce Champagne, MD       insulin  aspart (novoLOG ) injection 0-15 Units  0-15 Units Subcutaneous TID WC Candyce Champagne, MD   2 Units at 12/20/23 1156   insulin  aspart (novoLOG ) injection 0-5 Units  0-5 Units Subcutaneous QHS Kristy Schomburg, MD   2 Units at 12/20/23  2200   isosorbide  mononitrate (IMDUR ) 24 hr tablet 120 mg  120 mg Oral Daily Candyce Champagne, MD   120 mg at 12/20/23 0981   magic mouthwash  15 mL Oral QID PRN Candyce Champagne, MD       melatonin tablet 3 mg  3 mg Oral QHS PRN Candyce Champagne, MD       menthol -cetylpyridinium (CEPACOL) lozenge 3 mg  1 lozenge Oral PRN Candyce Champagne, MD       methocarbamol  (ROBAXIN ) injection 1,000 mg  1,000 mg Intravenous Q6H PRN Candyce Champagne, MD       metoprolol  tartrate (LOPRESSOR ) injection 5 mg  5 mg Intravenous Q6H PRN Candyce Champagne, MD       metoprolol  tartrate (LOPRESSOR ) tablet 12.5 mg  12.5 mg Oral BID Candyce Champagne, MD   12.5 mg at 12/20/23 2156   naphazoline-glycerin  (CLEAR EYES REDNESS) ophth solution 1-2 drop  1-2 drop Both Eyes QID PRN Candyce Champagne, MD       nitroGLYCERIN  (NITROSTAT ) SL tablet 0.4 mg  0.4 mg Sublingual Q5 min PRN Candyce Champagne, MD       ondansetron  (ZOFRAN ) tablet 4 mg  4 mg Oral Q6H PRN Candyce Champagne, MD       Or   ondansetron  (ZOFRAN ) injection 4 mg  4 mg Intravenous Q6H PRN Candyce Champagne, MD       pantoprazole  (PROTONIX ) EC tablet 40 mg  40 mg Oral QAC breakfast Candyce Champagne, MD   40 mg at 12/20/23 0804   phenol (CHLORASEPTIC) mouth spray 2 spray  2 spray Mouth/Throat PRN Candyce Champagne, MD       polycarbophil (FIBERCON) tablet 625 mg  625 mg Oral BID Candyce Champagne, MD   625 mg at 12/20/23 2155   prochlorperazine  (COMPAZINE ) tablet 10 mg  10 mg Oral Q6H PRN Candyce Champagne, MD       Or   prochlorperazine  (COMPAZINE ) injection 5-10 mg  5-10 mg Intravenous Q6H PRN Candyce Champagne, MD       rivaroxaban  (XARELTO ) tablet 20 mg  20 mg Oral Q supper Melvenia Stabs, MD   20 mg at 12/20/23 1632   simethicone  (MYLICON) chewable tablet 40 mg  40 mg Oral Q6H PRN Candyce Champagne, MD       sodium chloride  (OCEAN) 0.65 % nasal spray 1-2 spray  1-2 spray Each Nare Q6H PRN Candyce Champagne, MD       sodium chloride  flush (NS) 0.9 % injection 3 mL  3 mL Intravenous Q12H Norah Devin, MD   3 mL at  12/20/23 2200   sodium chloride  flush (NS) 0.9 % injection 3 mL  3 mL Intravenous PRN Candyce Champagne, MD       traMADol  (ULTRAM ) tablet 50-100 mg  50-100 mg Oral Q6H PRN Candyce Champagne, MD   50 mg at 12/20/23 2158   Facility-Administered Medications Ordered in Other Encounters  Medication Dose Route Frequency Provider Last Rate Last Admin   0.9 %  sodium chloride  infusion   Intravenous Continuous PRN Golob,  Jamie C., CRNA   New Bag at 08/31/20 0745     Allergies  Allergen Reactions   Bee Venom Swelling   Codeine Nausea Only    Signed:   Eddye Goodie, MD, FACS, MASCRS Esophageal, Gastrointestinal & Colorectal Surgery Robotic and Minimally Invasive Surgery  [facility] [address] [place], [place] [address] [phone] Fax [phone] Main  CONTACT INFORMATION: Weekday (9AM-5PM): Call [facility] main office at [phone] Weeknight (5PM-9AM) or Weekend/Holiday: Check EPIC "Web Links" tab & use "AMION" (password " [email or web address]") for General Surgery [facility] coverage  Please, DO NOT use SecureChat  (it is not reliable communication to reach operating surgeons & will lead to a delay in care).   Epic staff messaging available for outptient concerns needing 1-2 business day response.      12/21/2023, 7:51 AM

## 2023-12-21 NOTE — TOC Transition Note (Signed)
 Transition of Care [facility]) - Discharge Note   Patient Details  Name: Lucas [name]. MRN: [id number] Date of Birth: 05-20-1956  Transition of Care [facility]) CM/SW Contact:  Bari Leys, RN Phone Number: 12/21/2023, 11:17 AM   Clinical Narrative:  patient discharged prior to initial TOC assessment.      Final next level of care: Home/Self Care Barriers to Discharge: Continued Medical Work up   Patient Goals and CMS Choice Patient states their goals for this hospitalization and ongoing recovery are:: Home CMS Medicare.gov Compare Post Acute Care list provided to:: Patient Choice offered to / list presented to : Patient      Discharge Placement                       Discharge Plan and Services Additional resources added to the After Visit Summary for                                       Social Drivers of Health (SDOH) Interventions SDOH Screenings   Food Insecurity: No Food Insecurity (12/17/2023)  Housing: Low Risk  (12/17/2023)  Transportation Needs: No Transportation Needs (12/17/2023)  Utilities: Not At Risk (12/17/2023)  Depression (PHQ2-9): Low Risk  (11/26/2023)  Social Connections: Moderately Integrated (12/17/2023)  Tobacco Use: Low Risk  (12/17/2023)     Readmission Risk Interventions     No data to display

## 2023-12-21 NOTE — Plan of Care (Signed)
  Problem: Skin Integrity: Goal: Risk for impaired skin integrity will decrease Outcome: Progressing   

## 2023-12-23 LAB — SURGICAL PATHOLOGY

## 2023-12-24 ENCOUNTER — Ambulatory Visit: Payer: 59 | Attending: Cardiology | Admitting: Cardiology

## 2023-12-24 ENCOUNTER — Encounter: Payer: Self-pay | Admitting: Cardiology

## 2023-12-24 VITALS — BP 132/68 | HR 89 | Ht 70.0 in | Wt 247.0 lb

## 2023-12-24 DIAGNOSIS — I7121 Aneurysm of the ascending aorta, without rupture: Secondary | ICD-10-CM | POA: Diagnosis not present

## 2023-12-24 DIAGNOSIS — I2510 Atherosclerotic heart disease of native coronary artery without angina pectoris: Secondary | ICD-10-CM | POA: Diagnosis not present

## 2023-12-24 DIAGNOSIS — I4892 Unspecified atrial flutter: Secondary | ICD-10-CM | POA: Diagnosis not present

## 2023-12-24 DIAGNOSIS — I480 Paroxysmal atrial fibrillation: Secondary | ICD-10-CM

## 2023-12-24 DIAGNOSIS — I10 Essential (primary) hypertension: Secondary | ICD-10-CM

## 2023-12-24 DIAGNOSIS — E782 Mixed hyperlipidemia: Secondary | ICD-10-CM

## 2023-12-24 NOTE — Progress Notes (Signed)
Clinical Summary Mr. Osorto is a 68 y.o.male seen today for follow up of the following medical problems     1. Aflutter/Afib - s/p aflutter ablation 05/2016 by Dr Elberta Fortis.  01/2019 event monitor: short episode of afib, SR with PACs - toprol was increased to 150mg  daily      - afib ablation 08/2020 - started on multaq, followed by EP clinic  - at 03/2021 since patient had maintained NSR multaq was stopped     - no recent palpitations - compliant with meds - no bleeding on xarelto      2. CAD - previous chest pain episode occurred in the setting of afib with RVR - cath 01/29/16 with apical LAD disease, treated medically. Moderate RCA and D2 disease.    - 03/2018 cath distal LAD 50%, mid RCA 60%, LPDA 45%, D2 45%, mildly elevated LVEDP of 18    07/2020 cath: 1.  Left dominant coronary anatomy with diffuse nonobstructive coronary artery disease, no targets for PCI. 2.  Small vessel disease involving the distal circumflex Samaa Ueda and a nondominant RCA 3.  Normal LVEDP      -denies chest pains, no SOB/DOE.    3. Aortic aneurysm - [facility] 08/2018 CT ordered by pcp showed 4.3 ascending aortic aneurysm   12/2019 stable 40mm ascending aorta -09/2021 3.9 cm ascending aortc -09/2023 CTA: The ascending aorta measures up to 3.9 cm in greatest diameter.    4. HTN - compliant with meds, have not taken meds yet      5. Hyperliidemia - labs followed by pcp 09/2022 TC 113 TG 93 HDL 47 LDL 48  6. Leg edema -09/2023 echo: LVEF 70-75%, no WMAs, indet diastolci normal RV function, ascending aorta 43 mm.  - 09/2023 BNP 62 - mild swelling at times.   7. 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   Past Medical History:  Diagnosis Date   ADHD (attention deficit hyperactivity disorder)    Arthritis    Bilateral carpal tunnel syndrome 08/16/2019   CAD (coronary artery disease)    a. LHC on 01/29/16 which revealed  significant apical LAD stenosis, best treated medically. There was moderate disease in the D2 and mid nondominant RCA.[name] No PCI performed  b. 03/2018: repeat cath showing regression of his LAD stenosis now being at 50% with 60% mid RCA stenosis, 45% ostial LPDA stenosis, and 45% ostial second diagonal stenosis.   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 90 tablet 1   diltiazem (CARDIZEM CD) 240 MG 24  hr capsule TAKE 1 CAPSULE (240 MG TOTAL) BY MOUTH EVERY EVENING. 90 capsule 3   furosemide (LASIX) 20 MG tablet Take 1 tablet (20 mg total) by mouth daily as needed for fluid (swelling). 30 tablet 3   gabapentin (NEURONTIN) 300 MG capsule Take 600 mg by mouth 2 (two) times daily.     glimepiride (AMARYL) 4 MG tablet Take 4 mg by mouth 2 (two) times daily.      HYDROcodone-acetaminophen (NORCO/VICODIN) 5-325 MG tablet Take 1 tablet by mouth every 6 (six) hours as needed for moderate pain (pain score 4-6). 30 tablet 0   isosorbide mononitrate (IMDUR) 120 MG 24 hr tablet TAKE 1 TABLET BY MOUTH EVERY DAY 90 tablet 2   metoprolol succinate (TOPROL-XL) 100 MG 24 hr tablet TAKE 1 AND 1/2 TABLETS BY MOUTH DAILY WITH OR IMMEDIATELY FOLLOWING A MEAL (Patient taking differently: Take 50-100 mg by mouth See admin instructions. Take 50 mg by mouth in the  morning and 100 mg at night) 135 tablet 1   nitroGLYCERIN (NITROSTAT) 0.4 MG SL tablet Place 1 tablet (0.4 mg total) under the tongue every 5 (five) minutes as needed for chest pain. 25 tablet 3   OZEMPIC, 2 MG/DOSE, 8 MG/3ML SOPN Inject 2 mg into the skin once a week.     pantoprazole (PROTONIX) 40 MG tablet Take 40 mg by mouth daily before breakfast.      SYNJARDY XR 12.03-999 MG TB24 Take 1 tablet by mouth in the morning and at bedtime.     XARELTO 20 MG TABS tablet TAKE 1 TABLET BY MOUTH DAILY WITH SUPPER 30 tablet 5   No current facility-administered medications for this visit.   Facility-Administered Medications Ordered in Other Visits  Medication Dose Route Frequency Provider Last Rate Last Admin   0.9 %  sodium chloride infusion   Intravenous Continuous PRN Laruth Bouchard., CRNA   New Bag at 08/31/20 0745     Past Surgical History:  Procedure Laterality Date   ANKLE SURGERY Left 11/10/2010   tendon repair   ATRIAL FIBRILLATION ABLATION [name] 08/24/2020   Procedure: ATRIAL FIBRILLATION ABLATION;  Surgeon: Regan Lemming, MD;  Location: [facility] INVASIVE CV LAB;  Service: Cardiovascular;  Laterality: [name];   CARDIAC CATHETERIZATION [name] 01/29/2016   Procedure: Left Heart Cath and Coronary Angiography;  Surgeon: Lyn Records, MD;  Location: [facility] INVASIVE CV LAB;  Service: Cardiovascular;  Laterality: [name];   CARPAL TUNNEL RELEASE Right 09/06/2019   Procedure: CARPAL TUNNEL RELEASE;  Surgeon: Cindee Salt, MD;  Location: [facility];  Service: Orthopedics;  Laterality: Right;   CARPAL TUNNEL RELEASE Left 10/11/2019   Procedure: LEFT CARPAL TUNNEL RELEASE;  Surgeon: Cindee Salt, MD;  Location: [facility];  Service: Orthopedics;  Laterality: Left;  IV REGIONAL FOREARM BLOCK   CATARACT EXTRACTION W/PHACO  11/11/2012   Procedure: CATARACT EXTRACTION PHACO AND INTRAOCULAR LENS PLACEMENT (IOC);  Surgeon: Gemma Payor, MD;  Location: [facility];  Service: Ophthalmology;   Laterality: Right;  CDE:  5.56   CATARACT EXTRACTION W/PHACO Left 12/15/2013   Procedure: CATARACT EXTRACTION PHACO AND INTRAOCULAR LENS PLACEMENT (IOC);  Surgeon: Gemma Payor, MD;  Location: [facility];  Service: Ophthalmology;  Laterality: Left;  CDE:13.36   CHOLECYSTECTOMY     CHONDROPLASTY Right 05/28/2017   Procedure: RIGHT KNEE CHONDROPLASTY;  Surgeon: Loreta Ave, MD;  Location: [facility];  Service: Orthopedics;  Laterality: Right;   COLONOSCOPY     CYSTOSCOPY W/ URETERAL STENT PLACEMENT  right   ELECTROPHYSIOLOGIC STUDY [name] 06/02/2016   Procedure: A-Flutter Ablation;  Surgeon: Will Jorja Loa, MD;  Location: [facility];  Service: Cardiovascular;  Laterality: [name];   KNEE ARTHROSCOPY WITH MEDIAL MENISECTOMY Right 05/28/2017   Procedure: KNEE ARTHROSCOPY WITH MEDIAL MENISECTOMY WITH EXTENSIVE SYNOVECTOMY;  Surgeon: Loreta Ave, MD;  Location: [facility];  Service: Orthopedics;  Laterality: Right;   LEFT HEART CATH AND CORONARY ANGIOGRAPHY [name] 03/30/2018   Procedure: LEFT HEART CATH AND CORONARY ANGIOGRAPHY;  Surgeon: Marykay Lex, MD;  Location: [facility] INVASIVE CV LAB;  Service: Cardiovascular;  Laterality: [name];   LEFT HEART CATH AND CORONARY ANGIOGRAPHY [name] 07/27/2020   Procedure: LEFT HEART CATH AND CORONARY ANGIOGRAPHY;  Surgeon: Tonny Bollman, MD;  Location: [facility] INVASIVE CV LAB;  Service: Cardiovascular;  Laterality: [name];   left knee sugery Left    Arthroscopy   NASAL SINUS SURGERY     POLYPECTOMY     SHOULDER ARTHROSCOPY WITH BICEPS TENDON REPAIR Right    TEE WITHOUT CARDIOVERSION [name] 08/23/2020   Procedure: TRANSESOPHAGEAL ECHOCARDIOGRAM (TEE);  Surgeon: Elease Hashimoto Deloris Ping, MD;  Location: [facility];  Service: Cardiovascular;  Laterality: [name];   TONSILLECTOMY       Allergies  Allergen Reactions   Bee Venom Swelling   Codeine Nausea Only      Family History  Problem Relation Age of Onset   Heart murmur Mother     Aneurysm Mother    Hypertension Mother    Cirrhosis Father    Heart attack Sister    Diabetes Sister    Aneurysm Maternal Grandmother    Stroke Maternal Grandmother    Heart attack Paternal Grandmother    Stroke Sister    Colon cancer Neg Hx    Rectal cancer Neg Hx    Stomach cancer Neg Hx      Social History Mr. Brophy reports that he has never smoked. He has never used smokeless tobacco. Mr. Elamin reports current alcohol use.    Physical Examination Today's Vitals   12/24/23 0850  BP: 132/68  Pulse: 89  SpO2: 96%  Weight: 247 lb (112 kg)  Height: 5\' 10"  (1.778 m)   Body mass index is 35.44 kg/m.  Gen: resting comfortably, no acute distress HEENT: no scleral icterus, pupils equal round and reactive, no palptable cervical adenopathy,  CV:RRR, no mrg, no jvd Resp: Clear to auscultation bilaterally GI: abdomen is soft, non-tender, non-distended, normal bowel sounds, no hepatosplenomegaly MSK: extremities are warm, no edema.  Skin: warm, no rash Neuro:  no focal deficits Psych: appropriate affect   Diagnostic Studies  01/2016 echo Study Conclusions   - Left ventricle: The cavity size was mildly dilated. Wall   thickness was increased in a pattern of mild LVH. Systolic   function was normal. The estimated ejection fraction was in the   range of 55% to 60%.   01/2016 cath Dist LAD lesion, 80% stenosed. Mid RCA lesion, 60% stenosed. Ost 2nd Diag to 2nd Diag lesion, 60% stenosed. 2nd Mrg lesion, 30% stenosed. Mid LAD lesion, 25% stenosed. LPDA lesion, 45% stenosed.   Significant apical LAD stenosis, best treated with medication. Moderate disease in the second diagonal and mid nondominant RCA. Normal left ventricular systolic function. Normal left ventricular filling pressures.   Post Cath Recommendations:    Medical therapy of both atrial fibrillation and coronary artery disease. Percutaneous intervention is not currently indicated for coronary disease.    03/2018 cath Dist LAD lesion is 50% stenosed. -  Demonstrates regression of disease Mid RCA (nondominant) stable lesion is 60% stenosed. Stable Ost LPDA to LPDA lesion is 45% stenosed. Stable Ost 2nd Diag lesion is 45% stenosed. Demonstrates progression of disease The left ventricular systolic function is normal. The left ventricular ejection fraction is 55-65% by visual estimate. LV end diastolic pressure is mildly elevated.   Angiographically stable to improved coronary disease with notably less significant disease in the diagonal Nakaila Freeze as well as the apical LAD.   No potential culprit lesion to explain the patient's symptoms. Mildly elevated LVEDP.   Plan:  Discharge home after TR band removal and bedrest.   Restart Xarelto tonight.   01/2019 event monitor 14 day event monitor Min HR 54, Max HR 120, Avg HR 76 Sinus rhythm with PACs, with episode of afib rate 120 Reported symptoms correlated with sinus rhythm and PACs     09/2021 CTA IMPRESSION: 1. The thoracic aorta is not overtly aneurysmal on the current study measuring 3.9 cm in maximum caliber in the ascending segment. 2. Stable dilatation of the main pulmonary artery without significant additional central pulmonary artery dilatation. 3. Stable calcified granuloma in the posterior left lower lobe.   Assessment and Plan   1. Aflutter/Afib/acquired thrombophilia - no recent symptoms, continue current meds including xarelto for stroke prevention   2. CAD - no symptoms, continue current meds     3. HTN -bp is at goal, continue current therapy     4. Aortic aneurysm - mild and stable, extend surveillance to every 2 years. Repeat CTA 09/2025   5. Hyperlipidemia - lipids well controlled, continue current meds  F/u 6 months     Antoine Poche, M.D.

## 2023-12-24 NOTE — Patient Instructions (Signed)
Medication Instructions:  Continue all current medications.   Labwork: none  Testing/Procedures: none  Follow-Up: 6 months   Any Other Special Instructions Will Be Listed Below (If Applicable).   If you need a refill on your cardiac medications before your next appointment, please call your pharmacy.

## 2024-01-08 ENCOUNTER — Inpatient Hospital Stay: Payer: 59 | Attending: Oncology | Admitting: Oncology

## 2024-01-08 VITALS — BP 122/67 | HR 68 | Temp 98.1°F | Resp 18 | Ht 70.0 in | Wt 248.1 lb

## 2024-01-08 DIAGNOSIS — I10 Essential (primary) hypertension: Secondary | ICD-10-CM | POA: Insufficient documentation

## 2024-01-08 DIAGNOSIS — Z860100 Personal history of colon polyps, unspecified: Secondary | ICD-10-CM | POA: Insufficient documentation

## 2024-01-08 DIAGNOSIS — I7121 Aneurysm of the ascending aorta, without rupture: Secondary | ICD-10-CM | POA: Insufficient documentation

## 2024-01-08 DIAGNOSIS — C186 Malignant neoplasm of descending colon: Secondary | ICD-10-CM | POA: Diagnosis present

## 2024-01-08 DIAGNOSIS — E785 Hyperlipidemia, unspecified: Secondary | ICD-10-CM | POA: Diagnosis not present

## 2024-01-08 DIAGNOSIS — I2510 Atherosclerotic heart disease of native coronary artery without angina pectoris: Secondary | ICD-10-CM | POA: Insufficient documentation

## 2024-01-08 DIAGNOSIS — E119 Type 2 diabetes mellitus without complications: Secondary | ICD-10-CM | POA: Insufficient documentation

## 2024-01-08 DIAGNOSIS — G4730 Sleep apnea, unspecified: Secondary | ICD-10-CM | POA: Diagnosis not present

## 2024-01-08 DIAGNOSIS — I4891 Unspecified atrial fibrillation: Secondary | ICD-10-CM | POA: Insufficient documentation

## 2024-01-08 DIAGNOSIS — M549 Dorsalgia, unspecified: Secondary | ICD-10-CM | POA: Diagnosis not present

## 2024-01-08 DIAGNOSIS — G8929 Other chronic pain: Secondary | ICD-10-CM | POA: Insufficient documentation

## 2024-01-08 NOTE — Progress Notes (Signed)
  [facility] OFFICE PROGRESS NOTE   Diagnosis: Colon cancer  INTERVAL HISTORY:   Lucas [name] returns as scheduled.  He underwent robotic resection of the splenic flexure on 12/17/2023.  There was no evidence of metastatic disease at the time of surgery. The pathology revealed fibrosis and inflammation at the biopsy site.  No residual carcinoma.  Lucas [name] has recovered from surgery.  He is having bowel movements.  Objective:  Vital signs in last 24 hours:  Blood pressure 122/67, pulse 68, temperature 98.1 F (36.7 C), temperature source Temporal, resp. rate 18, height 5\' 10"  (1.778 m), weight 248 lb 1.6 oz (112.5 kg), SpO2 99%.   Lymphatics: No cervical, supraclavicular, or inguinal nodes.  Prominent bilateral axillary fat pads on the left greater than right Resp: Lungs clear bilaterally Cardio: Regular rate and rhythm GI: Surgical incisions, no hepatosplenomegaly Vascular: No leg edema  Lab Results:  Lab Results  Component Value Date   WBC 9.7 12/21/2023   HGB 12.8 (L) 12/21/2023   HCT 38.4 (L) 12/21/2023   MCV 92.5 12/21/2023   PLT 141 (L) 12/21/2023   NEUTROABS 5.4 12/21/2023    CMP  Lab Results  Component Value Date   NA 135 12/18/2023   K 4.3 12/19/2023   CL 104 12/18/2023   CO2 24 12/18/2023   GLUCOSE 251 (H) 12/18/2023   BUN 14 12/18/2023   CREATININE 0.83 12/19/2023   CALCIUM 8.5 (L) 12/18/2023   PROT 6.5 12/11/2023   ALBUMIN 3.8 12/11/2023   AST 25 12/11/2023   ALT 26 12/11/2023   ALKPHOS 87 12/11/2023   BILITOT 1.1 12/11/2023   GFRNONAA >60 12/19/2023   GFRAA 105 08/22/2020    Lab Results  Component Value Date   CEA1 2.2 12/11/2023      Medications: I have reviewed the patient's current medications.   Assessment/Plan: Adenocarcinoma involving a descending colon polyp (polyp resection site noted to be at the distal transverse colon during surgery) Colonoscopy 11/10/2023 filled multiple colon polyps including 3 descending colon  polyps.  1 descending colon polyp measured 9-10 mm with possible submucosal involvement, resected and retrieved, tattooed, normal mismatch repair protein expression, invasive moderately differentiated adenocarcinoma CT Abdo/pelvis 11/20/2023-no colon mass, indeterminate hyperattenuating lesion in the right hepatic dome CT chest 09/18/2023: Indeterminate 13 mm enhancing focus in the liver dome-hemangioma? Robotic resection of splenic flexure 12/17/2023: Fibrosis/chronic inflammation consistent with biopsy site, no residual carcinoma, negative margins, negative for lymphovascular invasion, 0/6 nodes Diabetes Atrial fibrillation/atrial flutter-status post ablation in 2021 CAD Ascending aortic aneurysm Hypertension Hyperlipidemia History of colon polyps-multiple adenomatous polyps on colonoscopy 11/10/2023 Chronic back pain Sleep apnea    Disposition: Lucas [name] underwent resection of the distal transverse colon polypectomy site on 12/17/2023.  There was no residual carcinoma.  He has been diagnosed with stage I colon cancer.  He has a good prognosis for a long-term disease-free survival.  There is no indication for adjuvant therapy.  He does not appear to have hereditary nonpolyposis colon cancer syndrome, but his family members are at increased risk of developing colorectal cancer and should receive appropriate screening.  Lucas [name] will return for an office visit and CEA in 6 months.  The hepatic dome lesion is likely a benign finding.  I will represent his case at the GI tumor conference to specifically review this lesion to decide on the need for additional imaging.  Thornton Papas, MD  01/08/2024  2:08 PM

## 2024-01-12 ENCOUNTER — Other Ambulatory Visit: Payer: Self-pay | Admitting: Cardiology

## 2024-01-12 NOTE — Telephone Encounter (Signed)
 Prescription refill request for Xarelto received.  Indication:afib Last office visit:2/25 Weight:112.5  kg Age:68 Scr:0.83  2/25 CrCl:133.66  ml/min  Prescription refilled

## 2024-01-13 ENCOUNTER — Encounter: Payer: Self-pay | Admitting: *Deleted

## 2024-01-13 ENCOUNTER — Other Ambulatory Visit: Payer: Self-pay

## 2024-01-13 NOTE — Progress Notes (Signed)
 The proposed treatment discussed in conference is for discussion purpose only and is not a binding recommendation.  The patients have not been physically examined, or presented with their treatment options.  Therefore, final treatment plans cannot be decided.

## 2024-01-13 NOTE — Progress Notes (Signed)
 PATIENT NAVIGATOR PROGRESS NOTE  Name: Pistol Kessenich. Date: 01/13/2024 MRN: [id number]  DOB: 02/01/1956   Reason for visit:  F/U call after GI conference discussion  Comments:  Called and spoke with Mr Novell regarding recommendations from GI conference that the liver lesion was present on scans dating back to 2021 and hepatic dome lesion is felt to a benign hemangioma and no F/U imaging recommended.  He has F/U appt with Dr Truett Perna in August. Encouraged him to call with any questions or concerns    Time spent counseling/coordinating care: 30-45 minutes

## 2024-03-03 ENCOUNTER — Other Ambulatory Visit: Payer: Self-pay | Admitting: Cardiology

## 2024-03-04 ENCOUNTER — Emergency Department (HOSPITAL_COMMUNITY)
Admission: EM | Admit: 2024-03-04 | Discharge: 2024-03-04 | Disposition: A | Discharge status: Home / Self Care | Attending: Emergency Medicine | Admitting: Emergency Medicine

## 2024-03-04 ENCOUNTER — Encounter (HOSPITAL_COMMUNITY): Payer: Self-pay

## 2024-03-04 ENCOUNTER — Emergency Department (HOSPITAL_COMMUNITY)

## 2024-03-04 ENCOUNTER — Other Ambulatory Visit: Payer: Self-pay

## 2024-03-04 DIAGNOSIS — Z23 Encounter for immunization: Secondary | ICD-10-CM | POA: Diagnosis not present

## 2024-03-04 DIAGNOSIS — Y9301 Activity, walking, marching and hiking: Secondary | ICD-10-CM | POA: Insufficient documentation

## 2024-03-04 DIAGNOSIS — S80212A Abrasion, left knee, initial encounter: Secondary | ICD-10-CM | POA: Diagnosis not present

## 2024-03-04 DIAGNOSIS — S40812A Abrasion of left upper arm, initial encounter: Secondary | ICD-10-CM | POA: Insufficient documentation

## 2024-03-04 DIAGNOSIS — Z7901 Long term (current) use of anticoagulants: Secondary | ICD-10-CM | POA: Insufficient documentation

## 2024-03-04 DIAGNOSIS — R0789 Other chest pain: Secondary | ICD-10-CM | POA: Insufficient documentation

## 2024-03-04 DIAGNOSIS — W541XXA Struck by dog, initial encounter: Secondary | ICD-10-CM | POA: Diagnosis not present

## 2024-03-04 DIAGNOSIS — S0083XA Contusion of other part of head, initial encounter: Secondary | ICD-10-CM

## 2024-03-04 DIAGNOSIS — S20211A Contusion of right front wall of thorax, initial encounter: Secondary | ICD-10-CM

## 2024-03-04 DIAGNOSIS — S0990XA Unspecified injury of head, initial encounter: Secondary | ICD-10-CM

## 2024-03-04 DIAGNOSIS — S80211A Abrasion, right knee, initial encounter: Secondary | ICD-10-CM | POA: Diagnosis not present

## 2024-03-04 DIAGNOSIS — W19XXXA Unspecified fall, initial encounter: Secondary | ICD-10-CM

## 2024-03-04 DIAGNOSIS — S60221A Contusion of right hand, initial encounter: Secondary | ICD-10-CM

## 2024-03-04 DIAGNOSIS — R519 Headache, unspecified: Secondary | ICD-10-CM | POA: Insufficient documentation

## 2024-03-04 DIAGNOSIS — S8001XA Contusion of right knee, initial encounter: Secondary | ICD-10-CM

## 2024-03-04 DIAGNOSIS — M25561 Pain in right knee: Secondary | ICD-10-CM | POA: Diagnosis present

## 2024-03-04 MED ORDER — TETANUS-DIPHTH-ACELL PERTUSSIS 5-2.5-18.5 LF-MCG/0.5 IM SUSY
0.5000 mL | PREFILLED_SYRINGE | Freq: Once | INTRAMUSCULAR | Status: AC
  Administered 2024-03-04: 0.5 mL via INTRAMUSCULAR
  Filled 2024-03-04: qty 0.5

## 2024-03-04 NOTE — ED Notes (Signed)
 Patient discharged. Provider spoke to patient. Paperwork given to patient and reviewed. Pt verbalized understanding. VSS. A+Ox4. Patient ambulated out of the ER with steady, independent gait. No iv in place.

## 2024-03-04 NOTE — ED Provider Notes (Signed)
 [facility]ERGENC[facility] DEPARTMENT AT [facility] Provider Note   CSN: [id number] Arrival date & time: 03/04/24  1412     History  Chief Complaint  Patient presents with   Lucas Felts. is a 68 y.o. male.  Patient is a 68 year old male who presents emergency department the chief complaint of pain to the right side of the face and head, right hand, right knee and right ribs.  Patient notes that he was attempting to walk a dog when it pulled him falling onto his right side.  He notes that he is on Xarelto  at this time.  There was no associated loss of consciousness and he has no active dizziness or lightheadedness.  Patient denies any associate abdominal pain, nausea, vomiting, diarrhea.  He has had no numbness or paresthesias.  He denies any pain to his neck or back.   Fall       Home Medications Prior to Admission medications   Medication Sig Start Date End Date Taking? Authorizing Provider  atorvastatin  (LIPITOR ) 80 MG tablet TAKE 1 TABLET BY MOUTH DAILY AT 6 PM. 03/04/24   Laurann Pollock, MD  diltiazem  (CARDIZEM  CD) 240 MG 24 hr capsule TAKE 1 CAPSULE (240 MG TOTAL) BY MOUTH EVERY EVENING. 11/25/23   Laurann Pollock, MD  furosemide  (LASIX ) 20 MG tablet Take 1 tablet (20 mg total) by mouth daily as needed for fluid (swelling). Patient not taking: Reported on 01/08/2024 08/25/23   Laurann Pollock, MD  gabapentin  (NEURONTIN ) 300 MG capsule Take 600 mg by mouth 2 (two) times daily. 11/21/22   [provider]  glimepiride  (AMARYL ) 4 MG tablet Take 4 mg by mouth 2 (two) times daily.  07/25/12   [provider]  HYDROcodone -acetaminophen  (NORCO/VICODIN) 5-325 MG tablet Take 1 tablet by mouth every 6 (six) hours as needed for moderate pain (pain score 4-6). 12/21/23   Candyce Champagne, MD  isosorbide  mononitrate (IMDUR ) 120 MG 24 hr tablet TAKE 1 TABLET BY MOUTH EVERY DAY 11/27/23   Laurann Pollock, MD  metoprolol  succinate (TOPROL -XL) 100 MG 24  hr tablet TAKE 1 AND 1/2 TABLETS BY MOUTH DAILY WITH OR IMMEDIATELY FOLLOWING A MEAL 03/04/24   Laurann Pollock, MD  nitroGLYCERIN  (NITROSTAT ) 0.4 MG SL tablet Place 1 tablet (0.4 mg total) under the tongue every 5 (five) minutes as needed for chest pain. Patient not taking: Reported on 01/08/2024 01/02/23   Laurann Pollock, MD  OZEMPIC, 2 MG/DOSE, 8 MG/3ML SOPN Inject 2 mg into the skin once a week. 10/29/23   [provider]  pantoprazole  (PROTONIX ) 40 MG tablet Take 40 mg by mouth daily before breakfast.     [provider]  SYNJARDY  XR 12.03-999 MG TB24 Take 1 tablet by mouth in the morning and at bedtime. 01/03/20   [provider]  XARELTO  20 MG TABS tablet TAKE 1 TABLET BY MOUTH DAILY WITH SUPPER 01/12/24   Laurann Pollock, MD      Allergies    Bee venom and Codeine    Review of Systems   Review of Systems  Respiratory:         Right rib pain  Musculoskeletal:        Pain to right hand and right knee  All other systems reviewed and are negative.   Physical Exam Updated Vital Signs BP 118/72 (BP Location: Right Arm)   Pulse 80   Temp 98.6 F (37 C) (Oral)  Resp 16   Ht 5\' 10"  (1.778 m)   Wt 113.4 kg   SpO2 96%   BMI 35.87 kg/m  Physical Exam Vitals and nursing note reviewed.  Constitutional:      Appearance: Normal appearance.  HENT:     Head: Normocephalic and atraumatic.     Nose: Nose normal.     Mouth/Throat:     Mouth: Mucous membranes are moist.  Eyes:     Extraocular Movements: Extraocular movements intact.     Conjunctiva/sclera: Conjunctivae normal.     Pupils: Pupils are equal, round, and reactive to light.  Cardiovascular:     Rate and Rhythm: Normal rate and regular rhythm.     Pulses: Normal pulses.     Heart sounds: Normal heart sounds. No murmur heard.    No gallop.  Pulmonary:     Effort: Pulmonary effort is normal. No respiratory distress.     Breath sounds: Normal breath sounds. No stridor. No wheezing or  rales.     Comments: Tender to palpation right lateral chest wall Abdominal:     General: Abdomen is flat. Bowel sounds are normal. There is no distension.     Palpations: Abdomen is soft.     Tenderness: There is no abdominal tenderness. There is no guarding.  Musculoskeletal:        General: Normal range of motion.     Cervical back: Normal range of motion and neck supple. No rigidity or tenderness.     Comments: Tenderness palpation noted over the right hand along the ulnar aspect, nontender palpation remainder bilateral upper extremities and joints, radial pulse 2+ upper extremities, cap refill less than 2 seconds distally, sensation intact distally, full range of motion noted throughout, no obvious deformity or bruising, no skin breakdown ulceration, no lacerations, superficial abrasions noted over the left upper extremity with no bony tenderness throughout Tenderness palpation noted over the right knee on the anterior aspect, nontender palpation of remainder of long bones and joints, DP and PT pulses are 2+ distally, sensation intact distally, full range of motion noted throughout, pelvis stable to AP and lateral compression, no obvious deformity or bruising, no skin breakdown or ulceration, abrasions over bilateral knees Nontender palpation over thoracic and lumbar spine, no step-off or deformity  Skin:    General: Skin is warm and dry.     Findings: No rash.  Neurological:     General: No focal deficit present.     Mental Status: He is alert and oriented to person, place, and time. Mental status is at baseline.     Cranial Nerves: No cranial nerve deficit.     Sensory: No sensory deficit.     Motor: No weakness.     Coordination: Coordination normal.     Gait: Gait normal.  Psychiatric:        Mood and Affect: Mood normal.        Behavior: Behavior normal.        Thought Content: Thought content normal.        Judgment: Judgment normal.     ED Results / Procedures / Treatments    Labs (all labs ordered are listed, but only abnormal results are displayed) Labs Reviewed - No data to display  EKG None  Radiology No results found.  Procedures Procedures    Medications Ordered in ED Medications - No data to display  ED Course/ Medical Decision Making/ A&P  Medical Decision Making Amount and/or Complexity of Data Reviewed Radiology: ordered.  Risk Prescription drug management.   This patient presents to the ED for concern of fall, headache, facial pain, right hand pain, right rib pain, right knee pain differential diagnosis includes long bone or joint fracture, rib fracture, contusion, strain, sprain, intracranial hemorrhage, CVA, TIA    Additional history obtained:  Additional history obtained from spouse External records from outside source obtained and reviewed including none   Imaging Studies ordered:  I ordered imaging studies including CT scan of head, maxillofacial, cervical spine, x-ray of right hand, right ribs, right knee I independently visualized and interpreted imaging which showed no acute intracranial hemorrhage, no facial fracture, no vertebral fracture, no fracture of right hand, ribs, knee I agree with the radiologist interpretation   Medicines ordered and prescription drug management:  I ordered medication including DTaP for abrasions Reevaluation of the patient after these medicines showed that the patient improved I have reviewed the patients home medicines and have made adjustments as needed   Problem List / ED Course:  Patient is doing well at this time and is stable for discharge home.  Discussed with patient and wife that all imaging has been unremarkable for any signs of acute fracture, intracranial hemorrhage, vertebral fracture.  Patient is able to ambulate without difficulty.  He has no concerning neurological deficits at this time.  Patient was nontender palpation over abdomen  diffusely and had no tenderness over thoracic or lumbar spine.  He had no other long bone or joint pain noted on physical exam.  Continue symptomatic treatment on outpatient basis was discussed as well as strict return precautions for any new or worsening symptoms.  Discussed the need for close follow-up with primary care doctor on an outpatient basis.  Patient voiced understanding and had no additional questions.   Social Determinants of Health:  None           Final Clinical Impression(s) / ED Diagnoses Final diagnoses:  None    Rx / DC Orders ED Discharge Orders     None         Emmalene Hare 03/04/24 1641    Ninetta Basket, MD 03/06/24 [phone]

## 2024-03-04 NOTE — ED Triage Notes (Signed)
 Pt stated that he was walking the dog today and the dog pulled him to the ground. Pt stated that he hit his arm and face on the ground. Pt taking Xerelto and is afraid since he hit his head.

## 2024-03-04 NOTE — Discharge Instructions (Signed)
 Please follow-up closely with your primary care doctor on an outpatient basis.  Return to emergency department immediately for any new or worsening symptoms.

## 2024-03-31 ENCOUNTER — Other Ambulatory Visit (HOSPITAL_COMMUNITY): Payer: Self-pay

## 2024-06-14 ENCOUNTER — Telehealth: Payer: Self-pay | Admitting: Oncology

## 2024-06-14 NOTE — Telephone Encounter (Signed)
 Called PT to reschedule appt time for 8/29. Appt time confirmed.

## 2024-07-08 ENCOUNTER — Ambulatory Visit: Payer: 59 | Admitting: Oncology

## 2024-07-08 ENCOUNTER — Inpatient Hospital Stay: Admitting: Nurse Practitioner

## 2024-07-08 ENCOUNTER — Other Ambulatory Visit: Payer: 59

## 2024-07-08 ENCOUNTER — Encounter: Payer: Self-pay | Admitting: Nurse Practitioner

## 2024-07-08 ENCOUNTER — Inpatient Hospital Stay: Attending: Nurse Practitioner

## 2024-07-08 VITALS — BP 125/72 | HR 99 | Temp 97.8°F | Resp 18 | Ht 70.0 in | Wt 252.9 lb

## 2024-07-08 DIAGNOSIS — E119 Type 2 diabetes mellitus without complications: Secondary | ICD-10-CM | POA: Insufficient documentation

## 2024-07-08 DIAGNOSIS — Z85038 Personal history of other malignant neoplasm of large intestine: Secondary | ICD-10-CM | POA: Diagnosis present

## 2024-07-08 DIAGNOSIS — I7121 Aneurysm of the ascending aorta, without rupture: Secondary | ICD-10-CM | POA: Insufficient documentation

## 2024-07-08 DIAGNOSIS — I2510 Atherosclerotic heart disease of native coronary artery without angina pectoris: Secondary | ICD-10-CM | POA: Diagnosis not present

## 2024-07-08 DIAGNOSIS — I4892 Unspecified atrial flutter: Secondary | ICD-10-CM | POA: Diagnosis not present

## 2024-07-08 DIAGNOSIS — I10 Essential (primary) hypertension: Secondary | ICD-10-CM | POA: Insufficient documentation

## 2024-07-08 DIAGNOSIS — Z860100 Personal history of colon polyps, unspecified: Secondary | ICD-10-CM | POA: Insufficient documentation

## 2024-07-08 DIAGNOSIS — E785 Hyperlipidemia, unspecified: Secondary | ICD-10-CM | POA: Diagnosis not present

## 2024-07-08 DIAGNOSIS — G4730 Sleep apnea, unspecified: Secondary | ICD-10-CM | POA: Insufficient documentation

## 2024-07-08 DIAGNOSIS — C186 Malignant neoplasm of descending colon: Secondary | ICD-10-CM | POA: Diagnosis not present

## 2024-07-08 LAB — CEA (ACCESS): CEA (CHCC): 2.06 ng/mL (ref 0.00–5.00)

## 2024-07-08 NOTE — Progress Notes (Signed)
  [facility] OFFICE PROGRESS NOTE   Diagnosis: Colon cancer  INTERVAL HISTORY:   Mr. Krasinski returns as scheduled.  He feels well.  No change in bowel habits.  He has an occasional loose stool.  No bleeding.  No abdominal pain.  No nausea or vomiting.  He has a good appetite.  Objective:  Vital signs in last 24 hours:  Blood pressure 125/72, pulse 99, temperature 97.8 F (36.6 C), temperature source Temporal, resp. rate 18, height 5' 10 (1.778 m), weight 252 lb 14.4 oz (114.7 kg), SpO2 100%.     Lymphatics: No palpable cervical, supraclavicular, axillary or inguinal lymph nodes. Resp: Lungs clear bilaterally. Cardio: Regular rate and rhythm. GI: No hepatosplenomegaly.  No mass.  Nontender. Vascular: No leg edema.  Lab Results:  Lab Results  Component Value Date   WBC 9.7 12/21/2023   HGB 12.8 (L) 12/21/2023   HCT 38.4 (L) 12/21/2023   MCV 92.5 12/21/2023   PLT 141 (L) 12/21/2023   NEUTROABS 5.4 12/21/2023    Imaging:  No results found.  Medications: I have reviewed the patient's current medications.  Assessment/Plan: Adenocarcinoma involving a descending colon polyp (polyp resection site noted to be at the distal transverse colon during surgery) Colonoscopy 11/10/2023 filled multiple colon polyps including 3 descending colon polyps.  1 descending colon polyp measured 9-10 mm with possible submucosal involvement, resected and retrieved, tattooed, normal mismatch repair protein expression, invasive moderately differentiated adenocarcinoma CT Abdo/pelvis 11/20/2023-no colon mass, indeterminate hyperattenuating lesion in the right hepatic dome CT chest 09/18/2023: Indeterminate 13 mm enhancing focus in the liver dome-hemangioma? Robotic resection of splenic flexure 12/17/2023: Fibrosis/chronic inflammation consistent with biopsy site, no residual carcinoma, negative margins, negative for lymphovascular invasion, 0/6 nodes GI tumor conference 01/13/2024-liver  lesion present on scans dating to 2021, hepatic dome lesion felt to be a benign hemangioma, no follow-up imaging recommended Diabetes Atrial fibrillation/atrial flutter-status post ablation in 2021 CAD Ascending aortic aneurysm Hypertension Hyperlipidemia History of colon polyps-multiple adenomatous polyps on colonoscopy 11/10/2023 Chronic back pain Sleep apnea  Disposition: Mr. Liebman appears well.  He remains in clinical remission from colon cancer.  We will follow-up on the CEA from today.  He will continue surveillance colonoscopy with Dr. Albertus.  He will return for CEA and follow-up in 6 months.  We are available to see him sooner if needed.    Olam Ned ANP/GNP-BC   07/08/2024  9:19 AM

## 2024-07-12 ENCOUNTER — Telehealth: Payer: Self-pay

## 2024-07-12 NOTE — Telephone Encounter (Signed)
-----   Message from Olam Ned sent at 07/08/2024 11:38 AM EDT ----- Please let him know the CEA is stable in normal range.  Follow-up as scheduled.

## 2024-07-12 NOTE — Telephone Encounter (Signed)
 Patient gave verbal understanding and had no further questions or concerns at this time

## 2024-07-13 ENCOUNTER — Other Ambulatory Visit: Payer: Self-pay | Admitting: Cardiology

## 2024-07-14 NOTE — Telephone Encounter (Signed)
 Prescription refill request for Xarelto  received.  Indication:afib  Last office visit: [place] 12/24/2023 Weight: 114 kg  Age: 68 yo  Scr: 0.81, 12/18/2023 CrCl: 137 ml/min   Refill sent.

## 2024-07-22 ENCOUNTER — Other Ambulatory Visit: Payer: Self-pay | Admitting: Cardiology

## 2024-08-25 ENCOUNTER — Encounter: Payer: Self-pay | Admitting: Cardiology

## 2024-08-25 ENCOUNTER — Ambulatory Visit: Attending: Cardiology | Admitting: Cardiology

## 2024-08-25 VITALS — BP 122/72 | HR 72 | Ht 70.0 in | Wt 256.0 lb

## 2024-08-25 DIAGNOSIS — I480 Paroxysmal atrial fibrillation: Secondary | ICD-10-CM | POA: Diagnosis not present

## 2024-08-25 DIAGNOSIS — D6869 Other thrombophilia: Secondary | ICD-10-CM

## 2024-08-25 DIAGNOSIS — I4892 Unspecified atrial flutter: Secondary | ICD-10-CM | POA: Diagnosis not present

## 2024-08-25 DIAGNOSIS — I10 Essential (primary) hypertension: Secondary | ICD-10-CM | POA: Diagnosis not present

## 2024-08-25 DIAGNOSIS — I2510 Atherosclerotic heart disease of native coronary artery without angina pectoris: Secondary | ICD-10-CM

## 2024-08-25 DIAGNOSIS — E782 Mixed hyperlipidemia: Secondary | ICD-10-CM

## 2024-08-25 NOTE — Patient Instructions (Signed)
 Medication Instructions:  Continue all current medications.   Labwork: none  Testing/Procedures: none  Follow-Up: 6 months   Any Other Special Instructions Will Be Listed Below (If Applicable).   If you need a refill on your cardiac medications before your next appointment, please call your pharmacy.

## 2024-08-25 NOTE — Progress Notes (Signed)
 Clinical Summary Lucas [name] is a 68 y.o.male seen today for follow up of the following medical problems     1. Aflutter/Afib - s/p aflutter ablation 05/2016 by Dr Inocencio.  01/2019 event monitor: short episode of afib, SR with PACs - toprol  was increased to 150mg  daily      - afib ablation 08/2020 - started on multaq , followed by EP clinic  - at 03/2021 since patient had maintained NSR multaq  was stopped     - no recent palpitations - compliant with meds - no bleeding on xarelto   EKG today shows NSR - no recent palpitations - compliant with meds, no bleeding on xarelto .       2. CAD - previous chest pain episode occurred in the setting of afib with RVR - cath 01/29/16 with apical LAD disease, treated medically. Moderate RCA and D2 disease.    - 03/2018 cath distal LAD 50%, mid RCA 60%, LPDA 45%, D2 45%, mildly elevated LVEDP of 18    07/2020 cath: 1.  Left dominant coronary anatomy with diffuse nonobstructive coronary artery disease, no targets for PCI. 2.  Small vessel disease involving the distal circumflex Lucas [name] and a nondominant RCA 3.  Normal LVEDP      - no recent chest pains.    3. Aortic aneurysm - [facility] 08/2018 CT ordered by pcp showed 4.3 ascending aortic aneurysm   12/2019 stable 40mm ascending aorta -09/2021 3.9 cm ascending aortc -09/2023 CTA: The ascending aorta measures up to 3.9 cm in greatest diameter.    4. HTN - compliant with meds, have not taken meds yet      5. Hyperliidemia - labs followed by pcp 09/2022 TC 113 TG 93 HDL 47 LDL 48   6. Leg edema -09/2023 echo: LVEF 70-75%, no WMAs, indet diastolci normal RV function, ascending aorta 43 mm.  - 09/2023 BNP 62 -    7. Colon cancer - 12/17/23 partial coletomy    8. 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   Past Medical History:  Diagnosis Date   ADHD (attention deficit hyperactivity disorder)    Arthritis    Bilateral carpal  tunnel syndrome 08/16/2019   CAD (coronary artery disease)    a. LHC on 01/29/16 which revealed significant apical LAD stenosis, best treated medically. There was moderate disease in the D2 and mid nondominant RCA.SABRA No PCI performed  b. 03/2018: repeat cath showing regression of his LAD stenosis now being at 50% with 60% mid RCA stenosis, 45% ostial LPDA stenosis, and 45% ostial second diagonal stenosis.   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 90 tablet 1   diltiazem  (CARDIZEM  CD) 240 MG 24 hr capsule TAKE 1 CAPSULE (240 MG TOTAL) BY MOUTH EVERY EVENING. 90 capsule 3   furosemide  (LASIX ) 20 MG tablet Take 1 tablet (20 mg total) by mouth daily as needed for fluid (swelling). (Patient not taking: Reported on 07/08/2024) 30 tablet 3   gabapentin  (NEURONTIN ) 300 MG capsule Take 600 mg by mouth 2 (two) times daily.     glimepiride  (AMARYL ) 4 MG tablet Take 4 mg by mouth 2 (two) times daily.      HYDROcodone -acetaminophen  (NORCO/VICODIN) 5-325 MG tablet Take 1 tablet by mouth every 6 (six) hours as needed for moderate pain (pain score 4-6). 30 tablet 0   isosorbide  mononitrate (IMDUR ) 120 MG 24 hr tablet TAKE 1 TABLET BY MOUTH EVERY DAY 90 tablet 2   metoprolol  succinate (TOPROL -XL) 100 MG 24 hr tablet TAKE 1 AND 1/2 TABLETS BY MOUTH DAILY WITH  OR IMMEDIATELY FOLLOWING A MEAL 135 tablet 1   nitroGLYCERIN  (NITROSTAT ) 0.4 MG SL tablet Place 1 tablet (0.4 mg total) under the tongue every 5 (five) minutes as needed for chest pain. (Patient not taking: Reported on 07/08/2024) 25 tablet 3   OZEMPIC, 2 MG/DOSE, 8 MG/3ML SOPN Inject 2 mg into the skin once a week.     pantoprazole  (PROTONIX ) 40 MG tablet Take 40 mg by mouth daily before breakfast.      SYNJARDY  XR 12.03-999 MG TB24 Take 1 tablet by mouth in the morning and at bedtime.     XARELTO  20 MG TABS tablet TAKE 1 TABLET BY MOUTH DAILY WITH SUPPER 30 tablet 5   No current facility-administered medications for this visit.   Facility-Administered Medications Ordered in Other Visits  Medication Dose Route Frequency Provider Last Rate Last Admin   0.9 %  sodium chloride  infusion   Intravenous Continuous PRN Golob, Jamie C., CRNA   New Bag at 08/31/20 0745     Past Surgical History:  Procedure Laterality Date   ANKLE SURGERY Left 11/10/2010   tendon repair   ATRIAL FIBRILLATION ABLATION [name] 08/24/2020   Procedure: ATRIAL FIBRILLATION ABLATION;  Surgeon: Inocencio Soyla Lunger, MD;  Location: [facility] INVASIVE CV LAB;  Service: Cardiovascular;  Laterality: [name];   CARDIAC CATHETERIZATION [name] 01/29/2016   Procedure: Left Heart Cath and Coronary Angiography;  Surgeon: Victory LELON Sharps, MD;  Location: [facility] INVASIVE CV LAB;  Service: Cardiovascular;  Laterality: [name];   CARPAL TUNNEL RELEASE Right 09/06/2019   Procedure: CARPAL TUNNEL RELEASE;  Surgeon: Murrell Kuba, MD;  Location: [facility];  Service: Orthopedics;  Laterality: Right;   CARPAL TUNNEL RELEASE Left 10/11/2019   Procedure: LEFT CARPAL TUNNEL RELEASE;  Surgeon: Murrell Kuba, MD;  Location: [facility];  Service: Orthopedics;  Laterality: Left;  IV REGIONAL FOREARM BLOCK   CATARACT EXTRACTION W/PHACO  11/11/2012   Procedure: CATARACT EXTRACTION PHACO AND INTRAOCULAR LENS PLACEMENT (IOC);  Surgeon: Cherene Mania, MD;   Location: [facility];  Service: Ophthalmology;  Laterality: Right;  CDE:  5.56   CATARACT EXTRACTION W/PHACO Left 12/15/2013   Procedure: CATARACT EXTRACTION PHACO AND INTRAOCULAR LENS PLACEMENT (IOC);  Surgeon: Cherene Mania, MD;  Location: [facility];  Service: Ophthalmology;  Laterality: Left;  CDE:13.36   CHOLECYSTECTOMY     CHONDROPLASTY Right 05/28/2017   Procedure: RIGHT KNEE CHONDROPLASTY;  Surgeon: Beverley Toribio FALCON, MD;  Location: [facility];  Service: Orthopedics;  Laterality:  Right;   COLONOSCOPY     CYSTOSCOPY W/ URETERAL STENT PLACEMENT     right   ELECTROPHYSIOLOGIC STUDY [name] 06/02/2016   Procedure: A-Flutter Ablation;  Surgeon: Will Gladis Norton, MD;  Location: [facility]VAS[facility] CV LAB;  Service: Cardiovascular;  Laterality: [name];   KNEE ARTHROSCOPY WITH MEDIAL MENISECTOMY Right 05/28/2017   Procedure: KNEE ARTHROSCOPY WITH MEDIAL MENISECTOMY WITH EXTENSIVE SYNOVECTOMY;  Surgeon: Beverley Toribio FALCON, MD;  Location: [facility];  Service: Orthopedics;  Laterality: Right;   LEFT HEART CATH AND CORONARY ANGIOGRAPHY [name] 03/30/2018   Procedure: LEFT HEART CATH AND CORONARY ANGIOGRAPHY;  Surgeon: Anner Alm ORN, MD;  Location: [facility] INVASIVE CV LAB;  Service: Cardiovascular;  Laterality: [name];   LEFT HEART CATH AND CORONARY ANGIOGRAPHY [name] 07/27/2020   Procedure: LEFT HEART CATH AND CORONARY ANGIOGRAPHY;  Surgeon: Wonda Sharper, MD;  Location: [facility] INVASIVE CV LAB;  Service: Cardiovascular;  Laterality: [name];   left knee sugery Left    Arthroscopy   NASAL SINUS SURGERY     POLYPECTOMY     SHOULDER ARTHROSCOPY WITH BICEPS TENDON REPAIR Right    TEE WITHOUT CARDIOVERSION [name] 08/23/2020   Procedure: TRANSESOPHAGEAL ECHOCARDIOGRAM (TEE);  Surgeon: Alveta Aleene PARAS, MD;  Location: [facility];  Service: Cardiovascular;  Laterality: [name];   TONSILLECTOMY       Allergies  Allergen Reactions   Bee Venom Swelling   Codeine Nausea Only      Family History  Problem Relation  Age of Onset   Heart murmur Mother    Aneurysm Mother    Hypertension Mother    Cirrhosis Father    Heart attack Sister    Diabetes Sister    Aneurysm Maternal Grandmother    Stroke Maternal Grandmother    Heart attack Paternal Grandmother    Stroke Sister    Colon cancer Neg Hx    Rectal cancer Neg Hx    Stomach cancer Neg Hx      Social History Mr. Klima reports that he has never smoked. He has never used smokeless tobacco. Mr. Goldfarb reports current alcohol use.     Physical Examination Today's Vitals   08/25/24 1420  BP: 122/72  Pulse: 72  SpO2: 96%  Weight: 256 lb (116.1 kg)  Height: 5' 10 (1.778 m)   Body mass index is 36.73 kg/m.  Gen: resting comfortably, no acute distress HEENT: no scleral icterus, pupils equal round and reactive, no palptable cervical adenopathy,  CV: RRR, no mrg, no jvd Resp: Clear to auscultation bilaterally GI: abdomen is soft, non-tender, non-distended, normal bowel sounds, no hepatosplenomegaly MSK: extremities are warm, no edema.  Skin: warm, no rash Neuro:  no focal deficits Psych: appropriate affect   Diagnostic Studies 01/2016 echo Study Conclusions   - Left ventricle: The cavity size was mildly dilated. Wall   thickness was increased in a pattern of mild LVH. Systolic   function was normal. The estimated ejection fraction was in the   range of 55% to 60%.   01/2016 cath Dist LAD lesion, 80% stenosed. Mid RCA lesion, 60% stenosed. Ost 2nd Diag to 2nd Diag lesion, 60% stenosed. 2nd Mrg lesion, 30% stenosed. Mid LAD lesion, 25% stenosed. LPDA lesion, 45% stenosed.   Significant apical LAD stenosis, best treated with medication. Moderate disease in the second diagonal and mid nondominant RCA. Normal left ventricular systolic function. Normal left ventricular filling pressures.   Post Cath Recommendations:    Medical therapy of both atrial fibrillation and coronary artery disease. Percutaneous intervention  is not  currently indicated for coronary disease.   03/2018 cath Dist LAD lesion is 50% stenosed. -Demonstrates regression of disease Mid RCA (nondominant) stable lesion is 60% stenosed. Stable Ost LPDA to LPDA lesion is 45% stenosed. Stable Ost 2nd Diag lesion is 45% stenosed. Demonstrates progression of disease The left ventricular systolic function is normal. The left ventricular ejection fraction is 55-65% by visual estimate. LV end diastolic pressure is mildly elevated.   Angiographically stable to improved coronary disease with notably less significant disease in the diagonal Kalem Rockwell as well as the apical LAD.   No potential culprit lesion to explain the patient's symptoms. Mildly elevated LVEDP.   Plan:  Discharge home after TR band removal and bedrest.   Restart Xarelto  tonight.   01/2019 event monitor 14 day event monitor Min HR 54, Max HR 120, Avg HR 76 Sinus rhythm with PACs, with episode of afib rate 120 Reported symptoms correlated with sinus rhythm and PACs     09/2021 CTA IMPRESSION: 1. The thoracic aorta is not overtly aneurysmal on the current study measuring 3.9 cm in maximum caliber in the ascending segment. 2. Stable dilatation of the main pulmonary artery without significant additional central pulmonary artery dilatation. 3. Stable calcified granuloma in the posterior left lower lobe.    Assessment and Plan   1. Aflutter/Afib/acquired thrombophilia - no symptoms, EKG today shows NSR - continue current meds including xarelto  for stroke prevention   2. CAD - denies symptoms, continue current meds       3. HTN - his bp is at goal, continue current meds     4. Aortic aneurysm - mild and stable, extend surveillance to every 2 years. Repeat CTA 09/2025   5. Hyperlipidemia - at goal, continue current therapy.      Lucas [name], M.D.

## 2024-11-05 ENCOUNTER — Other Ambulatory Visit: Payer: Self-pay | Admitting: Cardiology

## 2024-12-07 ENCOUNTER — Telehealth (HOSPITAL_BASED_OUTPATIENT_CLINIC_OR_DEPARTMENT_OTHER): Payer: Self-pay

## 2024-12-07 NOTE — Telephone Encounter (Signed)
"  ° °  Pre-operative Risk Assessment    Patient Name: Lucas [name].  DOB: Jul 19, 1956 MRN: [id number]   Date of last office visit: 08/25/2024 with Dr. Alvan Date of next office visit: None  Request for Surgical Clearance    Procedure:  Cervical ESI  Date of Surgery:  Clearance TBD                                 Surgeon:  Dr. Deatrice Manus Surgeon's Group or Practice Name:  [facility] Phone number:  [phone] Fax number:  [phone]   Type of Clearance Requested:   - Medical  - Pharmacy:  Hold Rivaroxaban  (Xarelto ) 3 days prior and pt can resume it the next day   Type of Anesthesia:  Not Indicated   Additional requests/questions:  None  Signed, Patrcia Iverson CROME   12/07/2024, 9:13 AM   "

## 2024-12-07 NOTE — Telephone Encounter (Signed)
 Pharmacy, can you please provide recommendations for holding Xarelto  for upcoming spinal injection?  Of note for pre-op APP, he will need a virtual visit after pharmacy recommendations.   Thank you!

## 2024-12-09 ENCOUNTER — Telehealth: Payer: Self-pay | Admitting: *Deleted

## 2024-12-09 NOTE — Telephone Encounter (Signed)
 Patient with diagnosis of Afib on Xarelto  for anticoagulation.    Procedure:  Cervical ESI  Date of procedure: TBD    CHA2DS2-VASc Score = 6   This indicates a 9.7% annual risk of stroke. The patient's score is based upon: CHF History: 0 HTN History: 1 Diabetes History: 1 Stroke History: 2 Vascular Disease History: 1 Age Score: 1 Gender Score: 0     CrCl 84 mL/min Platelet count 141   Patient has not had an Afib/aflutter ablation in the last 3 months, DCCV within the last 4 weeks or a watchman implanted in the last 45 days     Per office protocol, patient can hold Xarelto  for 3 days prior to procedure.   Patient will not need bridging with Lovenox  (enoxaparin ) around procedure.  **This guidance is not considered finalized until pre-operative APP has relayed final recommendations.**

## 2024-12-09 NOTE — Telephone Encounter (Signed)
 Spoke with pt and scheduled him for a preop telehealth clearance visit. Will route back to requesting surgeons office to make them aware.

## 2024-12-09 NOTE — Telephone Encounter (Signed)
"  °  Patient Consent for Virtual Visit        Lucas [name]. has provided verbal consent on 12/09/2024 for a virtual visit (video or telephone).   CONSENT FOR VIRTUAL VISIT FOR:  Lucas [name].  By participating in this virtual visit I agree to the following:  I hereby voluntarily request, consent and authorize [facility] and its employed or contracted physicians, physician assistants, nurse practitioners or other licensed health care professionals (the Practitioner), to provide me with telemedicine health care services (the Services) as deemed necessary by the treating Practitioner. I acknowledge and consent to receive the Services by the Practitioner via telemedicine. I understand that the telemedicine visit will involve communicating with the Practitioner through live audiovisual communication technology and the disclosure of certain medical information by electronic transmission. I acknowledge that I have been given the opportunity to request an in-person assessment or other available alternative prior to the telemedicine visit and am voluntarily participating in the telemedicine visit.  I understand that I have the right to withhold or withdraw my consent to the use of telemedicine in the course of my care at any time, without affecting my right to future care or treatment, and that the Practitioner or I may terminate the telemedicine visit at any time. I understand that I have the right to inspect all information obtained and/or recorded in the course of the telemedicine visit and may receive copies of available information for a reasonable fee.  I understand that some of the potential risks of receiving the Services via telemedicine include:  Delay or interruption in medical evaluation due to technological equipment failure or disruption; Information transmitted may not be sufficient (e.g. poor resolution of images) to allow for appropriate medical decision making by the  Practitioner; and/or  In rare instances, security protocols could fail, causing a breach of personal health information.  Furthermore, I acknowledge that it is my responsibility to provide information about my medical history, conditions and care that is complete and accurate to the best of my ability. I acknowledge that Practitioner's advice, recommendations, and/or decision may be based on factors not within their control, such as incomplete or inaccurate data provided by me or distortions of diagnostic images or specimens that may result from electronic transmissions. I understand that the practice of medicine is not an exact science and that Practitioner makes no warranties or guarantees regarding treatment outcomes. I acknowledge that a copy of this consent can be made available to me via my patient portal [facility] MyChart), or I can request a printed copy by calling the office of [facility].    I understand that my insurance will be billed for this visit.   I have read or had this consent read to me. I understand the contents of this consent, which adequately explains the benefits and risks of the Services being provided via telemedicine.  I have been provided ample opportunity to ask questions regarding this consent and the Services and have had my questions answered to my satisfaction. I give my informed consent for the services to be provided through the use of telemedicine in my medical care    "

## 2024-12-09 NOTE — Telephone Encounter (Signed)
 Patient is returning call

## 2024-12-09 NOTE — Telephone Encounter (Signed)
 Left message to call back to schedule tele pre op appt.

## 2024-12-15 ENCOUNTER — Ambulatory Visit

## 2024-12-15 DIAGNOSIS — Z01810 Encounter for preprocedural cardiovascular examination: Secondary | ICD-10-CM

## 2024-12-15 NOTE — Progress Notes (Signed)
 "   Virtual Visit via Telephone Note   Because of Lucas [name] [employer]. co-morbid illnesses, he is at least at moderate risk for complications without adequate follow up.  This format is felt to be most appropriate for this patient at this time.  Due to technical limitations with video connection (technology), today's appointment will be conducted as an audio only telehealth visit, and Lucas [name] [employer]. verbally agreed to proceed in this manner.   All issues noted in this document were discussed and addressed.  No physical exam could be performed with this format.  Evaluation Performed:  Preoperative cardiovascular risk assessment _____________   Date:  12/15/2024   Patient ID:  Lucas ORN Kathlynn Mickey., DOB 07-28-1956, MRN [id number] Patient Location:  Home Provider location:   Office  Primary Care Provider:  Rosamond Leta NOVAK, MD Primary Cardiologist:  Alvan Carrier, MD  Chief Complaint / Patient Profile   69 y.o. y/o male with a h/o Atrial flutter, Afib, CAD, aortic aneurysm, HTN, TIAs and HLD who is pending cervical ESI  and presents today for telephonic preoperative cardiovascular risk assessment.  History of Present Illness    Lucas Wiedemann. is a 69 y.o. male who presents via audio/video conferencing for a telehealth visit today.  Pt was last seen in cardiology clinic on 08/25/2024 by Dr. Alvan .  At that time Lucas Calixto. was doing well.  The patient is now pending procedure as outlined above. Since his last visit, he feels good today.  He had 1 episode of atrial fibrillation when he was out to eat but he tells me it did not last very long and resolved on its own.  No other episodes.  He has been doing well without any chest pain.  He does not have any shortness of breath either.  Occasionally has some back pain which holds him back however he does surpass 4 METS on the DASI.   Per office protocol, patient can hold Xarelto  for 3 days prior to procedure.  He can resume when it is medically  safe to do so. Patient will not need bridging with Lovenox  (enoxaparin ) around procedure    Past Medical History    Past Medical History:  Diagnosis Date   ADHD (attention deficit hyperactivity disorder)    Arthritis    Bilateral carpal tunnel syndrome 08/16/2019   CAD (coronary artery disease)    a. LHC on 01/29/16 which revealed significant apical LAD stenosis, best treated medically. There was moderate disease in the D2 and mid nondominant RCA.SABRA No PCI performed  b. 03/2018: repeat cath showing regression of his LAD stenosis now being at 50% with 60% mid RCA stenosis, 45% ostial LPDA stenosis, and 45% ostial second diagonal stenosis.   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   Bilateral   Past Surgical History:  Procedure Laterality Date   ANKLE SURGERY Left  11/10/2010   tendon repair   ATRIAL FIBRILLATION ABLATION [name] 08/24/2020   Procedure: ATRIAL FIBRILLATION ABLATION;  Surgeon: Inocencio Soyla Lunger, MD;  Location: [facility];  Service: Cardiovascular;  Laterality: [name];   CARDIAC CATHETERIZATION [name] 01/29/2016   Procedure: Left Heart Cath and Coronary Angiography;  Surgeon: Victory LELON Sharps, MD;  Location: [facility] INVASIVE CV LAB;  Service: Cardiovascular;  Laterality: [name];   CARPAL TUNNEL RELEASE Right 09/06/2019   Procedure: CARPAL TUNNEL RELEASE;  Surgeon: Murrell Kuba, MD;  Location: [facility];  Service: Orthopedics;  Laterality: Right;   CARPAL TUNNEL RELEASE Left 10/11/2019   Procedure: LEFT CARPAL TUNNEL RELEASE;  Surgeon: Murrell Kuba,  MD;  Location: [facility];  Service: Orthopedics;  Laterality: Left;  IV REGIONAL FOREARM BLOCK   CATARACT EXTRACTION W/PHACO  11/11/2012   Procedure: CATARACT EXTRACTION PHACO AND INTRAOCULAR LENS PLACEMENT (IOC);  Surgeon: Cherene Mania, MD;  Location: [facility];  Service: Ophthalmology;  Laterality: Right;  CDE:  5.56   CATARACT EXTRACTION W/PHACO Left 12/15/2013   Procedure: CATARACT EXTRACTION PHACO AND INTRAOCULAR LENS PLACEMENT (IOC);  Surgeon: Cherene Mania, MD;  Location: [facility];  Service: Ophthalmology;  Laterality: Left;  CDE:13.36   CHOLECYSTECTOMY     CHONDROPLASTY Right 05/28/2017   Procedure: RIGHT KNEE CHONDROPLASTY;  Surgeon: Beverley Toribio FALCON, MD;  Location: [facility];  Service: Orthopedics;  Laterality: Right;   COLONOSCOPY     CYSTOSCOPY W/ URETERAL STENT PLACEMENT     right   ELECTROPHYSIOLOGIC STUDY [name] 06/02/2016   Procedure: A-Flutter Ablation;  Surgeon: Will Lunger Inocencio, MD;  Location: [facility];  Service: Cardiovascular;  Laterality: [name];   KNEE ARTHROSCOPY WITH MEDIAL MENISECTOMY Right 05/28/2017   Procedure: KNEE ARTHROSCOPY WITH MEDIAL MENISECTOMY WITH EXTENSIVE SYNOVECTOMY;  Surgeon: Beverley Toribio FALCON, MD;  Location:  SURGERY CENTER;  Service: Orthopedics;  Laterality: Right;   LEFT HEART CATH AND CORONARY ANGIOGRAPHY [name] 03/30/2018   Procedure: LEFT HEART CATH AND CORONARY ANGIOGRAPHY;  Surgeon: Anner Alm LELON, MD;  Location: [facility] INVASIVE CV LAB;  Service: Cardiovascular;  Laterality: [name];   LEFT HEART CATH AND CORONARY ANGIOGRAPHY [name] 07/27/2020   Procedure: LEFT HEART CATH AND CORONARY ANGIOGRAPHY;  Surgeon: Wonda Sharper, MD;  Location: [facility] INVASIVE CV LAB;  Service: Cardiovascular;  Laterality: [name];   left knee sugery Left    Arthroscopy   NASAL SINUS SURGERY     POLYPECTOMY     SHOULDER ARTHROSCOPY WITH BICEPS TENDON REPAIR Right    TEE WITHOUT CARDIOVERSION [name] 08/23/2020   Procedure: TRANSESOPHAGEAL  ECHOCARDIOGRAM (TEE);  Surgeon: Alveta Aleene PARAS, MD;  Location: [facility];  Service: Cardiovascular;  Laterality: [name];   TONSILLECTOMY      Allergies  Allergies[1]  Home Medications    Prior to Admission medications  Medication Sig Start Date End Date Taking? Authorizing Provider  atorvastatin  (LIPITOR ) 80 MG tablet TAKE 1 TABLET BY MOUTH DAILY AT 6 PM. 07/22/24   Branch, Dorn FALCON, MD  diltiazem  (CARDIZEM  CD) 240 MG 24 hr capsule TAKE 1 CAPSULE (240 MG TOTAL) BY MOUTH EVERY EVENING. 11/25/23   Alvan Dorn FALCON, MD  furosemide  (LASIX ) 20 MG tablet Take 1 tablet (20 mg total) by mouth daily as needed for fluid (swelling). 08/25/23   Alvan Dorn FALCON, MD  gabapentin  (NEURONTIN ) 300 MG capsule Take 600 mg by mouth 2 (two) times daily. 11/21/22   [provider]  glimepiride  (AMARYL ) 4 MG tablet Take 4 mg by mouth 2 (  two) times daily.  07/25/12   [provider]  HYDROcodone -acetaminophen  (NORCO/VICODIN) 5-325 MG tablet Take 1 tablet by mouth every 6 (six) hours as needed for moderate pain (pain score 4-6). 12/21/23   Sheldon Standing, MD  isosorbide  mononitrate (IMDUR ) 120 MG 24 hr tablet TAKE 1 TABLET BY MOUTH EVERY DAY 11/07/24   Alvan Dorn FALCON, MD  metoprolol  succinate (TOPROL -XL) 100 MG 24 hr tablet TAKE 1 AND 1/2 TABLETS BY MOUTH DAILY WITH OR IMMEDIATELY FOLLOWING A MEAL 07/14/24   Branch, Dorn FALCON, MD  nitroGLYCERIN  (NITROSTAT ) 0.4 MG SL tablet Place 1 tablet (0.4 mg total) under the tongue every 5 (five) minutes as needed for chest pain. 01/02/23   Alvan Dorn FALCON, MD  OZEMPIC, 2 MG/DOSE, 8 MG/3ML SOPN Inject 2 mg into the skin once a week. 10/29/23   [provider]  pantoprazole  (PROTONIX ) 40 MG tablet Take 40 mg by mouth daily before breakfast.     [provider]  SYNJARDY  XR 12.03-999 MG TB24 Take 1 tablet by mouth in the morning and at bedtime. 01/03/20   [provider]  XARELTO  20 MG TABS tablet TAKE 1 TABLET BY MOUTH DAILY WITH  SUPPER 07/14/24   Alvan Dorn FALCON, MD    Physical Exam    Vital Signs:  Lucas LELON Kathlynn Mickey. does not have vital signs available for review today.  Given telephonic nature of communication, physical exam is limited. AAOx3. NAD. Normal affect.  Speech and respirations are unlabored.  Accessory Clinical Findings    None  Assessment & Plan    1.  Preoperative Cardiovascular Risk Assessment:  Mr. Zhong perioperative risk of a major cardiac event is 0.9% according to the Revised Cardiac Risk Index (RCRI).  Therefore, he is at low risk for perioperative complications.   His functional capacity is good at 6.61 METs according to the [facility] Activity Status Index (DASI). Recommendations: According to ACC/AHA guidelines, no further cardiovascular testing needed.  The patient may proceed to surgery at acceptable risk.   Antiplatelet and/or Anticoagulation Recommendations:  Xarelto  (Rivaroxaban ) can be held for 3 days prior to surgery.  Please resume post op when felt to be safe.     The patient was advised that if he develops new symptoms prior to surgery to contact our office to arrange for a follow-up visit, and he verbalized understanding.   A copy of this note will be routed to requesting surgeon.  Time:   Today, I have spent 7 minutes with the patient with telehealth technology discussing medical history, symptoms, and management plan.     Lucas LOISE Fabry, PA-C  12/15/2024, 1:42 PM     [1]  Allergies Allergen Reactions   Bee Venom Swelling   Codeine Nausea Only   "

## 2025-01-05 ENCOUNTER — Ambulatory Visit: Admitting: Oncology

## 2025-01-05 ENCOUNTER — Other Ambulatory Visit
